# Patient Record
Sex: Female | Born: 1965
Health system: Southern US, Community
[De-identification: ages and names within clinical notes are randomized; demographics above are authoritative.]

## PROBLEM LIST (undated history)

## (undated) ENCOUNTER — Ambulatory Visit: Source: Home / Self Care

## (undated) DIAGNOSIS — E0590 Thyrotoxicosis, unspecified without thyrotoxic crisis or storm: Secondary | ICD-10-CM

## (undated) DIAGNOSIS — F329 Major depressive disorder, single episode, unspecified: Secondary | ICD-10-CM

## (undated) DIAGNOSIS — H269 Unspecified cataract: Secondary | ICD-10-CM

## (undated) DIAGNOSIS — E785 Hyperlipidemia, unspecified: Secondary | ICD-10-CM

## (undated) DIAGNOSIS — G4730 Sleep apnea, unspecified: Secondary | ICD-10-CM

## (undated) DIAGNOSIS — M2550 Pain in unspecified joint: Secondary | ICD-10-CM

## (undated) DIAGNOSIS — K219 Gastro-esophageal reflux disease without esophagitis: Secondary | ICD-10-CM

## (undated) DIAGNOSIS — I10 Essential (primary) hypertension: Secondary | ICD-10-CM

## (undated) DIAGNOSIS — D649 Anemia, unspecified: Secondary | ICD-10-CM

## (undated) DIAGNOSIS — G4700 Insomnia, unspecified: Secondary | ICD-10-CM

## (undated) DIAGNOSIS — D849 Immunodeficiency, unspecified: Secondary | ICD-10-CM

## (undated) DIAGNOSIS — Z8719 Personal history of other diseases of the digestive system: Secondary | ICD-10-CM

## (undated) DIAGNOSIS — I739 Peripheral vascular disease, unspecified: Secondary | ICD-10-CM

## (undated) DIAGNOSIS — F4310 Post-traumatic stress disorder, unspecified: Secondary | ICD-10-CM

## (undated) DIAGNOSIS — F419 Anxiety disorder, unspecified: Secondary | ICD-10-CM

## (undated) DIAGNOSIS — T4145XA Adverse effect of unspecified anesthetic, initial encounter: Secondary | ICD-10-CM

## (undated) DIAGNOSIS — H35039 Hypertensive retinopathy, unspecified eye: Secondary | ICD-10-CM

## (undated) DIAGNOSIS — E11319 Type 2 diabetes mellitus with unspecified diabetic retinopathy without macular edema: Secondary | ICD-10-CM

## (undated) DIAGNOSIS — M797 Fibromyalgia: Secondary | ICD-10-CM

## (undated) DIAGNOSIS — M2540 Effusion, unspecified joint: Secondary | ICD-10-CM

## (undated) DIAGNOSIS — T8859XA Other complications of anesthesia, initial encounter: Secondary | ICD-10-CM

## (undated) HISTORY — PX: VASCULAR SURGERY: SHX849

## (undated) HISTORY — PX: AMPUTATION: SHX166

## (undated) HISTORY — DX: Anemia, unspecified: D64.9

## (undated) HISTORY — DX: Type 2 diabetes mellitus with unspecified diabetic retinopathy without macular edema: E11.319

## (undated) HISTORY — PX: TUBAL LIGATION: SHX77

## (undated) HISTORY — DX: Anxiety disorder, unspecified: F41.9

## (undated) HISTORY — DX: Hypertensive retinopathy, unspecified eye: H35.039

## (undated) HISTORY — DX: Unspecified cataract: H26.9

## (undated) HISTORY — PX: DILATION AND CURETTAGE OF UTERUS: SHX78

## (undated) HISTORY — PX: OTHER SURGICAL HISTORY: SHX169

## (undated) HISTORY — DX: Major depressive disorder, single episode, unspecified: F32.9

## (undated) HISTORY — DX: Hyperlipidemia, unspecified: E78.5

## (undated) HISTORY — PX: COLONOSCOPY: SHX174

---
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
## 1988-03-05 HISTORY — PX: CHOLECYSTECTOMY: SHX55

## 1999-09-09 ENCOUNTER — Emergency Department (HOSPITAL_COMMUNITY): Admission: EM | Admit: 1999-09-09 | Discharge: 1999-09-10 | Payer: Self-pay | Admitting: Emergency Medicine

## 1999-09-16 ENCOUNTER — Inpatient Hospital Stay (HOSPITAL_COMMUNITY): Admission: AD | Admit: 1999-09-16 | Discharge: 1999-09-16 | Payer: Self-pay | Admitting: Obstetrics

## 1999-09-16 ENCOUNTER — Encounter: Payer: Self-pay | Admitting: Obstetrics

## 1999-09-25 ENCOUNTER — Encounter: Payer: Self-pay | Admitting: Obstetrics

## 1999-09-25 ENCOUNTER — Ambulatory Visit (HOSPITAL_COMMUNITY): Admission: RE | Admit: 1999-09-25 | Discharge: 1999-09-25 | Payer: Self-pay | Admitting: Obstetrics

## 1999-10-02 ENCOUNTER — Ambulatory Visit (HOSPITAL_COMMUNITY): Admission: RE | Admit: 1999-10-02 | Discharge: 1999-10-02 | Payer: Self-pay | Admitting: Obstetrics

## 1999-10-04 ENCOUNTER — Encounter: Payer: Self-pay | Admitting: Obstetrics

## 1999-10-04 ENCOUNTER — Ambulatory Visit (HOSPITAL_COMMUNITY): Admission: AD | Admit: 1999-10-04 | Discharge: 1999-10-04 | Payer: Self-pay | Admitting: Obstetrics

## 1999-10-04 ENCOUNTER — Encounter (INDEPENDENT_AMBULATORY_CARE_PROVIDER_SITE_OTHER): Payer: Self-pay

## 2000-09-11 ENCOUNTER — Encounter: Payer: Self-pay | Admitting: Emergency Medicine

## 2000-09-11 ENCOUNTER — Inpatient Hospital Stay (HOSPITAL_COMMUNITY): Admission: EM | Admit: 2000-09-11 | Discharge: 2000-09-13 | Payer: Self-pay | Admitting: Emergency Medicine

## 2000-09-11 ENCOUNTER — Encounter: Payer: Self-pay | Admitting: Internal Medicine

## 2004-09-18 ENCOUNTER — Other Ambulatory Visit (HOSPITAL_COMMUNITY): Admission: RE | Admit: 2004-09-18 | Discharge: 2004-10-02 | Payer: Self-pay | Admitting: Psychiatry

## 2004-09-18 ENCOUNTER — Ambulatory Visit: Payer: Self-pay | Admitting: Psychiatry

## 2005-01-02 ENCOUNTER — Emergency Department (HOSPITAL_COMMUNITY): Admission: EM | Admit: 2005-01-02 | Discharge: 2005-01-02 | Payer: Self-pay | Admitting: Emergency Medicine

## 2005-02-23 ENCOUNTER — Ambulatory Visit: Payer: Self-pay | Admitting: Family Medicine

## 2005-03-01 ENCOUNTER — Ambulatory Visit: Payer: Self-pay | Admitting: Family Medicine

## 2005-03-07 ENCOUNTER — Ambulatory Visit: Payer: Self-pay | Admitting: Family Medicine

## 2005-03-13 ENCOUNTER — Ambulatory Visit: Payer: Self-pay | Admitting: Family Medicine

## 2005-03-13 ENCOUNTER — Ambulatory Visit: Payer: Self-pay | Admitting: *Deleted

## 2005-06-28 ENCOUNTER — Emergency Department (HOSPITAL_COMMUNITY): Admission: EM | Admit: 2005-06-28 | Discharge: 2005-06-28 | Payer: Self-pay | Admitting: Emergency Medicine

## 2005-07-05 ENCOUNTER — Ambulatory Visit: Payer: Self-pay | Admitting: Internal Medicine

## 2005-07-09 ENCOUNTER — Ambulatory Visit: Payer: Self-pay | Admitting: Internal Medicine

## 2005-07-09 ENCOUNTER — Encounter (HOSPITAL_COMMUNITY): Admission: RE | Admit: 2005-07-09 | Discharge: 2005-10-07 | Payer: Self-pay | Admitting: Internal Medicine

## 2005-07-13 ENCOUNTER — Emergency Department (HOSPITAL_COMMUNITY): Admission: EM | Admit: 2005-07-13 | Discharge: 2005-07-14 | Payer: Self-pay | Admitting: Emergency Medicine

## 2005-07-13 ENCOUNTER — Ambulatory Visit: Payer: Self-pay | Admitting: Family Medicine

## 2005-07-18 ENCOUNTER — Ambulatory Visit: Payer: Self-pay | Admitting: Family Medicine

## 2005-07-24 ENCOUNTER — Ambulatory Visit (HOSPITAL_COMMUNITY): Admission: RE | Admit: 2005-07-24 | Discharge: 2005-07-24 | Payer: Self-pay | Admitting: Internal Medicine

## 2005-07-24 ENCOUNTER — Ambulatory Visit: Payer: Self-pay | Admitting: *Deleted

## 2005-07-31 ENCOUNTER — Ambulatory Visit: Payer: Self-pay | Admitting: Internal Medicine

## 2005-08-10 ENCOUNTER — Ambulatory Visit: Payer: Self-pay | Admitting: Family Medicine

## 2005-08-15 ENCOUNTER — Ambulatory Visit: Payer: Self-pay | Admitting: Endocrinology

## 2005-08-19 ENCOUNTER — Inpatient Hospital Stay (HOSPITAL_COMMUNITY): Admission: EM | Admit: 2005-08-19 | Discharge: 2005-08-20 | Payer: Self-pay | Admitting: Emergency Medicine

## 2005-08-24 ENCOUNTER — Ambulatory Visit (HOSPITAL_COMMUNITY): Admission: RE | Admit: 2005-08-24 | Discharge: 2005-08-24 | Payer: Self-pay | Admitting: Obstetrics & Gynecology

## 2005-08-31 ENCOUNTER — Encounter (INDEPENDENT_AMBULATORY_CARE_PROVIDER_SITE_OTHER): Payer: Self-pay | Admitting: Specialist

## 2005-08-31 ENCOUNTER — Ambulatory Visit (HOSPITAL_COMMUNITY): Admission: RE | Admit: 2005-08-31 | Discharge: 2005-08-31 | Payer: Self-pay | Admitting: Obstetrics & Gynecology

## 2005-10-05 ENCOUNTER — Ambulatory Visit (HOSPITAL_COMMUNITY): Admission: RE | Admit: 2005-10-05 | Discharge: 2005-10-05 | Payer: Self-pay | Admitting: Obstetrics & Gynecology

## 2006-12-19 ENCOUNTER — Encounter: Payer: Self-pay | Admitting: *Deleted

## 2006-12-19 DIAGNOSIS — R635 Abnormal weight gain: Secondary | ICD-10-CM | POA: Insufficient documentation

## 2006-12-19 DIAGNOSIS — E042 Nontoxic multinodular goiter: Secondary | ICD-10-CM | POA: Insufficient documentation

## 2006-12-19 DIAGNOSIS — F329 Major depressive disorder, single episode, unspecified: Secondary | ICD-10-CM | POA: Insufficient documentation

## 2006-12-19 DIAGNOSIS — F411 Generalized anxiety disorder: Secondary | ICD-10-CM | POA: Insufficient documentation

## 2006-12-19 DIAGNOSIS — IMO0001 Reserved for inherently not codable concepts without codable children: Secondary | ICD-10-CM | POA: Insufficient documentation

## 2006-12-19 DIAGNOSIS — R0789 Other chest pain: Secondary | ICD-10-CM | POA: Insufficient documentation

## 2006-12-19 DIAGNOSIS — K8020 Calculus of gallbladder without cholecystitis without obstruction: Secondary | ICD-10-CM | POA: Insufficient documentation

## 2006-12-19 DIAGNOSIS — F3289 Other specified depressive episodes: Secondary | ICD-10-CM | POA: Insufficient documentation

## 2006-12-19 DIAGNOSIS — I10 Essential (primary) hypertension: Secondary | ICD-10-CM | POA: Insufficient documentation

## 2006-12-19 DIAGNOSIS — E119 Type 2 diabetes mellitus without complications: Secondary | ICD-10-CM | POA: Insufficient documentation

## 2007-02-10 ENCOUNTER — Emergency Department (HOSPITAL_COMMUNITY): Admission: EM | Admit: 2007-02-10 | Discharge: 2007-02-11 | Payer: Self-pay | Admitting: Emergency Medicine

## 2007-06-26 ENCOUNTER — Emergency Department (HOSPITAL_COMMUNITY): Admission: EM | Admit: 2007-06-26 | Discharge: 2007-06-27 | Payer: Self-pay | Admitting: Emergency Medicine

## 2007-06-28 ENCOUNTER — Emergency Department (HOSPITAL_COMMUNITY): Admission: EM | Admit: 2007-06-28 | Discharge: 2007-06-28 | Payer: Self-pay | Admitting: Emergency Medicine

## 2007-06-30 ENCOUNTER — Emergency Department (HOSPITAL_COMMUNITY): Admission: EM | Admit: 2007-06-30 | Discharge: 2007-06-30 | Payer: Self-pay | Admitting: Family Medicine

## 2007-09-04 ENCOUNTER — Emergency Department (HOSPITAL_COMMUNITY): Admission: EM | Admit: 2007-09-04 | Discharge: 2007-09-04 | Payer: Self-pay | Admitting: Emergency Medicine

## 2007-12-20 ENCOUNTER — Emergency Department (HOSPITAL_COMMUNITY): Admission: EM | Admit: 2007-12-20 | Discharge: 2007-12-20 | Payer: Self-pay | Admitting: Emergency Medicine

## 2007-12-28 ENCOUNTER — Emergency Department (HOSPITAL_COMMUNITY): Admission: EM | Admit: 2007-12-28 | Discharge: 2007-12-28 | Payer: Self-pay | Admitting: Emergency Medicine

## 2008-01-30 IMAGING — US US SOFT TISSUE HEAD/NECK
1 series · 14 of 25 positions shown · non-contrast
Comparison: [REDACTED] chest CT report of 09/11/00 (describing left greater than right lobe enlargement and tracheal displacement).

CLINICAL DATA: Chest pain. Evaluate thyroid as cause of chest pain. 
 THYROID ULTRASOUND ? 07/24/05:
TECHNIQUE: Ultrasound examination of the thyroid gland and adjacent soft tissue structures was performed.

[Series 1: unknown · 0.12mm/px · 14 of 40 slices shown]
[im 1/40]
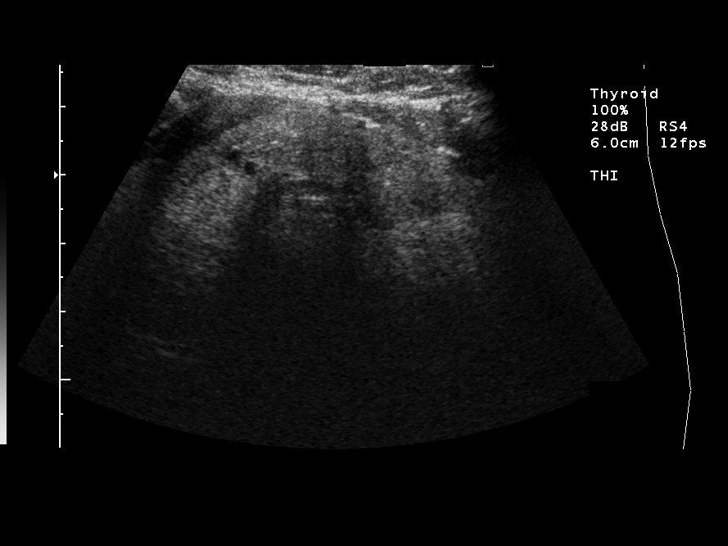
[im 4/40]
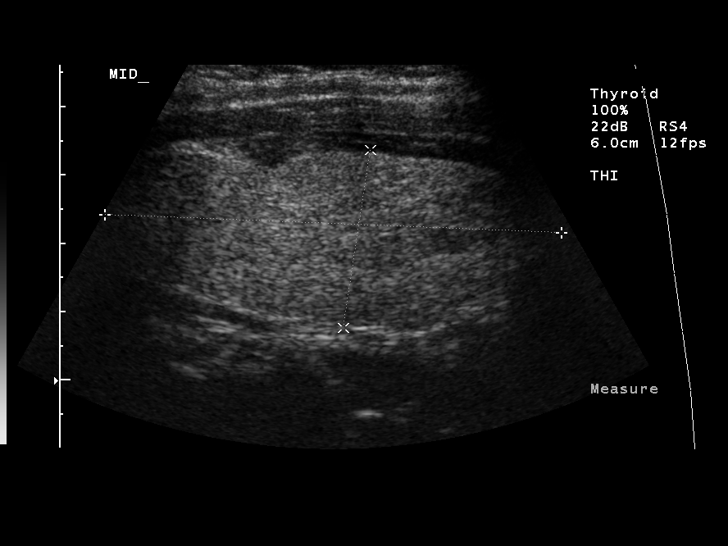
[im 7/40]
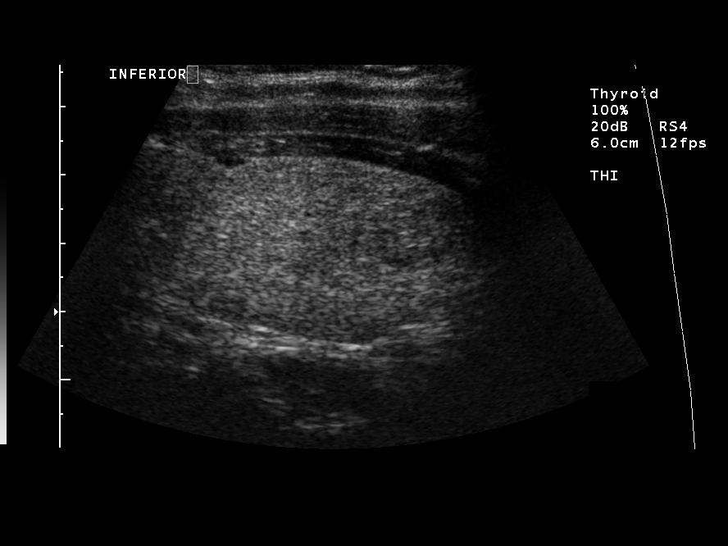
[im 10/40]
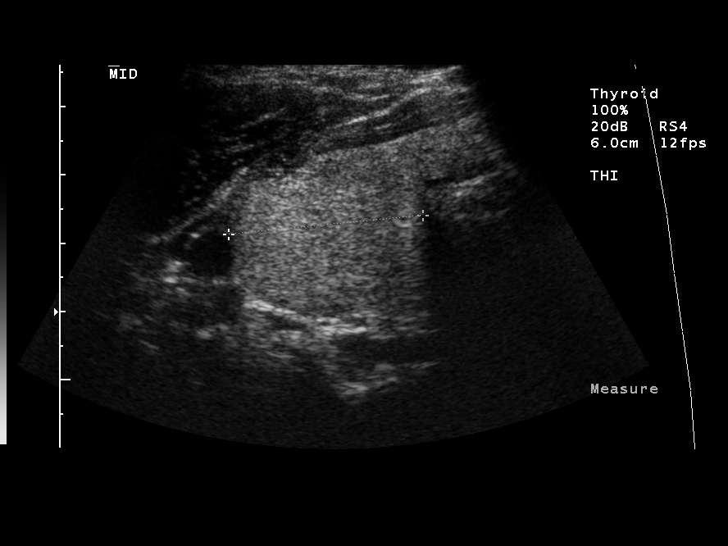
[im 14/40]
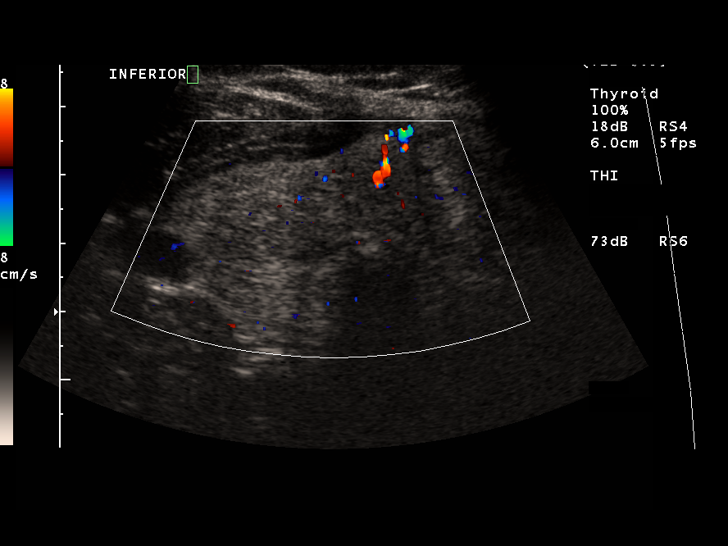
[im 15/40]
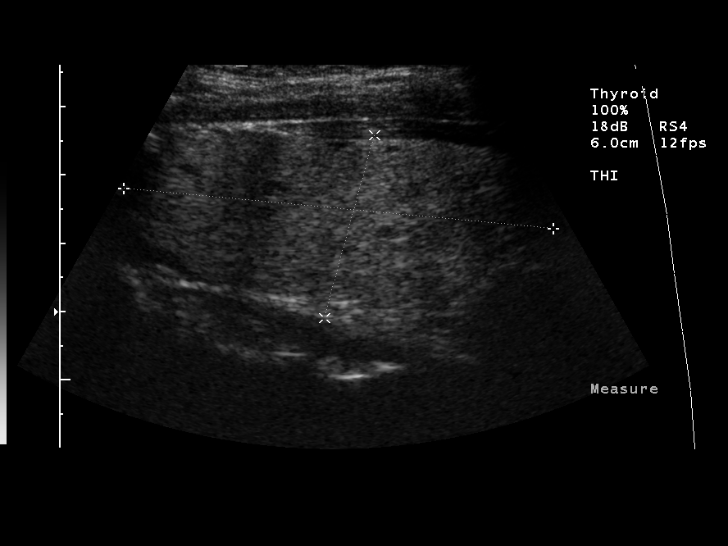
[im 18/40]
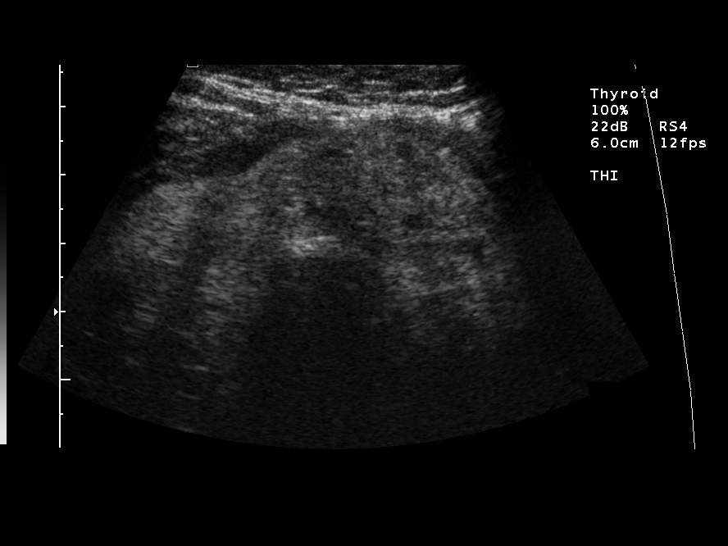
[im 22/40]
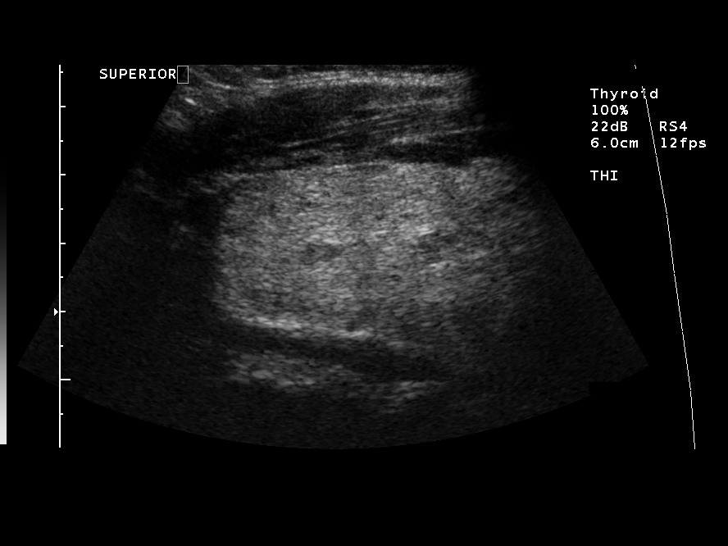
[im 25/40]
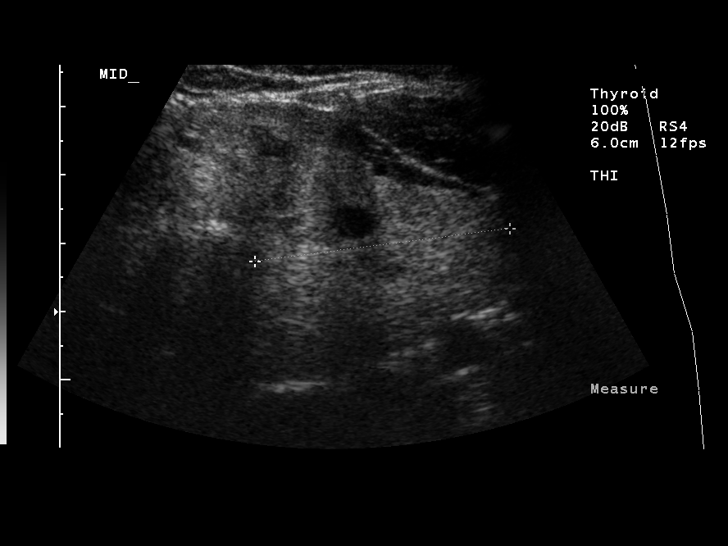
[im 27/40]
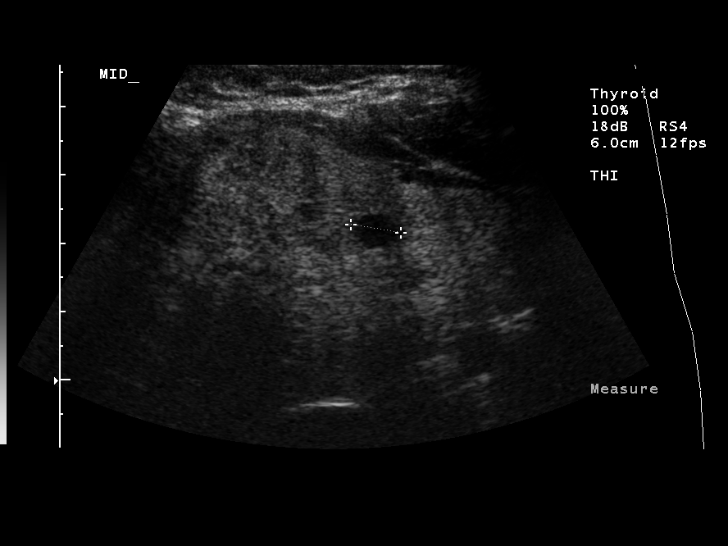
[im 30/40]
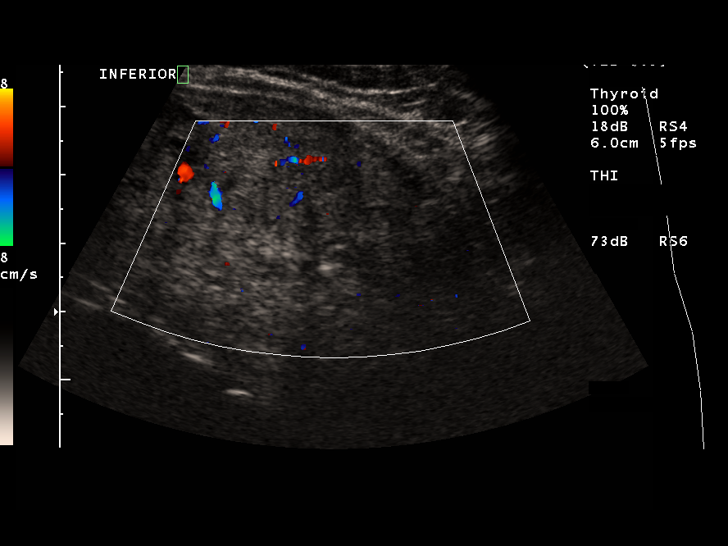
[im 33/40]
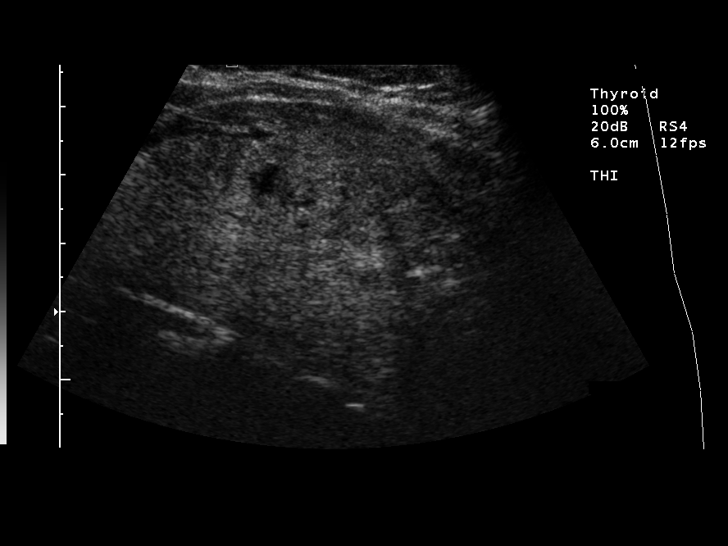
[im 36/40]
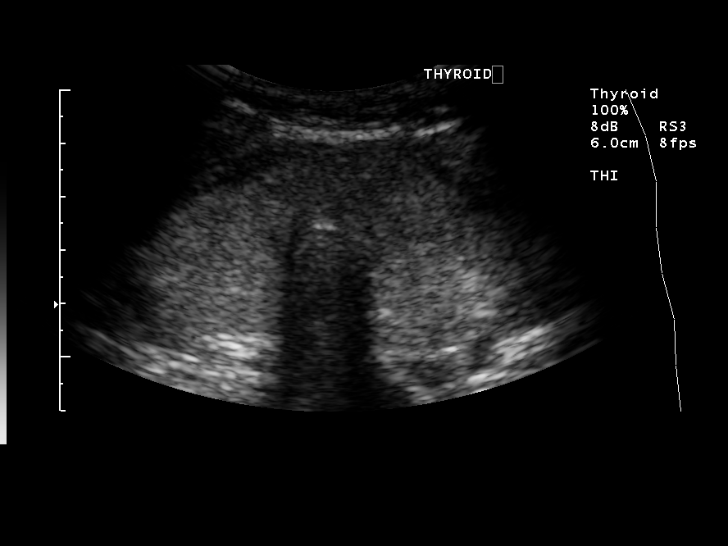
[im 40/40]
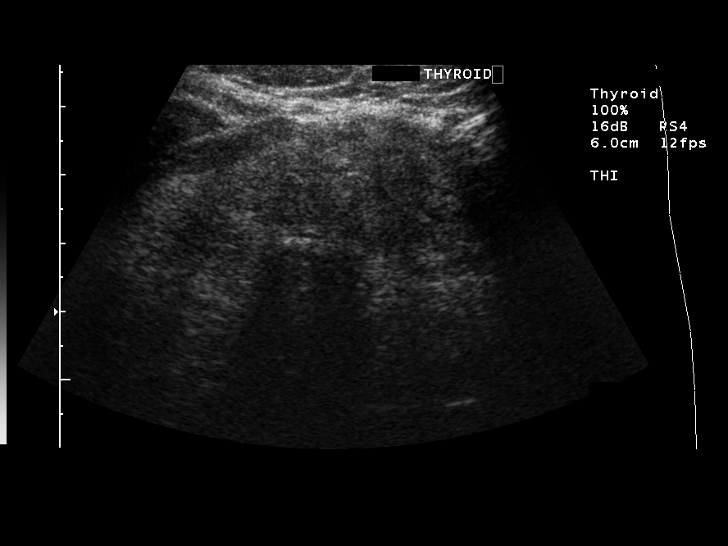

[14 of 25 positions shown; findings below may reference images not displayed]

The thyroid gland is diffusely enlarged with the right lobe measuring 6.7 cm long x 2.6 cm AP x 2.9 cm wide and left lobe 6.8 cm long x 2.8 cm AP x 3.8 cm wide. The isthmus measures 1.4 cm.  Thyroid echotexture is diffusely inhomogeneous.  8 mm benign appearing cystic focus is seen at the mid left lobe with no other discrete nodule seen.
IMPRESSION: Slight multinodular goiter with 8 mm degenerated cystic adenoma mid left lobe.

## 2008-02-23 ENCOUNTER — Ambulatory Visit: Payer: Self-pay | Admitting: Family Medicine

## 2008-02-23 DIAGNOSIS — K219 Gastro-esophageal reflux disease without esophagitis: Secondary | ICD-10-CM | POA: Insufficient documentation

## 2008-02-23 LAB — CONVERTED CEMR LAB
ALT: 14 units/L (ref 0–35)
AST: 10 units/L (ref 0–37)
Albumin: 4 g/dL (ref 3.5–5.2)
Alkaline Phosphatase: 86 units/L (ref 39–117)
BUN: 15 mg/dL (ref 6–23)
Basophils Absolute: 0.1 10*3/uL (ref 0.0–0.1)
Basophils Relative: 1 % (ref 0–1)
Blood Glucose, Fingerstick: 298
CO2: 20 meq/L (ref 19–32)
Calcium: 9 mg/dL (ref 8.4–10.5)
Chloride: 99 meq/L (ref 96–112)
Cholesterol: 201 mg/dL — ABNORMAL HIGH (ref 0–200)
Creatinine, Ser: 0.59 mg/dL (ref 0.40–1.20)
Eosinophils Absolute: 0.3 10*3/uL (ref 0.0–0.7)
Eosinophils Relative: 3 % (ref 0–5)
Glucose, Bld: 266 mg/dL — ABNORMAL HIGH (ref 70–99)
HCT: 45.1 % (ref 36.0–46.0)
HDL: 54 mg/dL (ref >39)
Hemoglobin: 14.7 g/dL (ref 12.0–15.0)
Hgb A1c MFr Bld: 12.5 %
LDL Cholesterol: 120 mg/dL — ABNORMAL HIGH (ref 0–99)
Lymphocytes Relative: 46 % (ref 12–46)
Lymphs Abs: 5.4 10*3/uL — ABNORMAL HIGH (ref 0.7–4.0)
MCHC: 32.6 g/dL (ref 30.0–36.0)
MCV: 95.1 fL (ref 78.0–100.0)
Monocytes Absolute: 0.8 10*3/uL (ref 0.1–1.0)
Monocytes Relative: 6 % (ref 3–12)
Neutro Abs: 5.3 10*3/uL (ref 1.7–7.7)
Neutrophils Relative %: 45 % (ref 43–77)
Platelets: 371 10*3/uL (ref 150–400)
Potassium: 3.9 meq/L (ref 3.5–5.3)
RBC: 4.74 M/uL (ref 3.87–5.11)
RDW: 13.4 % (ref 11.5–15.5)
Sodium: 136 meq/L (ref 135–145)
TSH: 0.605 microintl units/mL (ref 0.350–4.50)
Total Bilirubin: 0.5 mg/dL (ref 0.3–1.2)
Total CHOL/HDL Ratio: 3.7
Total Protein: 7.5 g/dL (ref 6.0–8.3)
Triglycerides: 137 mg/dL (ref <150)
VLDL: 27 mg/dL (ref 0–40)
WBC: 11.8 10*3/uL — ABNORMAL HIGH (ref 4.0–10.5)

## 2008-03-04 ENCOUNTER — Ambulatory Visit (HOSPITAL_COMMUNITY): Admission: RE | Admit: 2008-03-04 | Discharge: 2008-03-04 | Payer: Self-pay | Admitting: Family Medicine

## 2008-03-12 ENCOUNTER — Ambulatory Visit: Payer: Self-pay | Admitting: Family Medicine

## 2008-03-15 ENCOUNTER — Encounter (INDEPENDENT_AMBULATORY_CARE_PROVIDER_SITE_OTHER): Payer: Self-pay | Admitting: Family Medicine

## 2008-03-29 ENCOUNTER — Encounter (INDEPENDENT_AMBULATORY_CARE_PROVIDER_SITE_OTHER): Payer: Self-pay | Admitting: Family Medicine

## 2008-04-12 ENCOUNTER — Emergency Department (HOSPITAL_COMMUNITY): Admission: EM | Admit: 2008-04-12 | Discharge: 2008-04-12 | Payer: Self-pay | Admitting: Emergency Medicine

## 2008-04-13 ENCOUNTER — Encounter (INDEPENDENT_AMBULATORY_CARE_PROVIDER_SITE_OTHER): Payer: Self-pay | Admitting: Family Medicine

## 2008-04-16 ENCOUNTER — Encounter (INDEPENDENT_AMBULATORY_CARE_PROVIDER_SITE_OTHER): Payer: Self-pay | Admitting: Family Medicine

## 2008-04-21 ENCOUNTER — Ambulatory Visit: Payer: Self-pay | Admitting: Family Medicine

## 2008-04-21 DIAGNOSIS — D692 Other nonthrombocytopenic purpura: Secondary | ICD-10-CM | POA: Insufficient documentation

## 2008-04-22 ENCOUNTER — Emergency Department (HOSPITAL_COMMUNITY): Admission: EM | Admit: 2008-04-22 | Discharge: 2008-04-23 | Payer: Self-pay | Admitting: Emergency Medicine

## 2008-04-23 ENCOUNTER — Emergency Department (HOSPITAL_COMMUNITY): Admission: EM | Admit: 2008-04-23 | Discharge: 2008-04-24 | Payer: Self-pay | Admitting: Emergency Medicine

## 2008-04-26 ENCOUNTER — Encounter (INDEPENDENT_AMBULATORY_CARE_PROVIDER_SITE_OTHER): Payer: Self-pay | Admitting: Family Medicine

## 2008-04-27 ENCOUNTER — Encounter (INDEPENDENT_AMBULATORY_CARE_PROVIDER_SITE_OTHER): Payer: Self-pay | Admitting: Family Medicine

## 2008-04-28 ENCOUNTER — Ambulatory Visit: Payer: Self-pay | Admitting: Family Medicine

## 2008-04-28 DIAGNOSIS — M310 Hypersensitivity angiitis: Secondary | ICD-10-CM | POA: Insufficient documentation

## 2008-04-28 LAB — CONVERTED CEMR LAB
Blood Glucose, AC Bkfst: 294 mg/dL
HCV Ab: NEGATIVE
Hep A Total Ab: NEGATIVE
Hep B Core Total Ab: NEGATIVE
Hep B E Ab: NEGATIVE
Hepatitis B Surface Ag: NEGATIVE
Hgb A1c MFr Bld: 11.6 %
Rhuematoid fact SerPl-aCnc: <20 intl units/mL (ref 0–20)

## 2008-04-29 ENCOUNTER — Ambulatory Visit: Payer: Self-pay | Admitting: Vascular Surgery

## 2008-04-29 ENCOUNTER — Ambulatory Visit: Admission: RE | Admit: 2008-04-29 | Discharge: 2008-04-29 | Payer: Self-pay | Admitting: Family Medicine

## 2008-04-29 ENCOUNTER — Inpatient Hospital Stay (HOSPITAL_COMMUNITY): Admission: EM | Admit: 2008-04-29 | Discharge: 2008-05-05 | Payer: Self-pay | Admitting: Emergency Medicine

## 2008-04-29 ENCOUNTER — Encounter: Payer: Self-pay | Admitting: Vascular Surgery

## 2008-04-29 ENCOUNTER — Telehealth (INDEPENDENT_AMBULATORY_CARE_PROVIDER_SITE_OTHER): Payer: Self-pay | Admitting: Internal Medicine

## 2008-04-29 DIAGNOSIS — I739 Peripheral vascular disease, unspecified: Secondary | ICD-10-CM | POA: Insufficient documentation

## 2008-04-29 HISTORY — PX: FEMORAL BYPASS: SHX50

## 2008-05-06 ENCOUNTER — Emergency Department (HOSPITAL_COMMUNITY): Admission: EM | Admit: 2008-05-06 | Discharge: 2008-05-07 | Payer: Self-pay | Admitting: Emergency Medicine

## 2008-05-11 ENCOUNTER — Emergency Department (HOSPITAL_COMMUNITY): Admission: EM | Admit: 2008-05-11 | Discharge: 2008-05-11 | Payer: Self-pay | Admitting: *Deleted

## 2008-05-12 ENCOUNTER — Ambulatory Visit: Payer: Self-pay | Admitting: Vascular Surgery

## 2008-05-13 ENCOUNTER — Encounter (INDEPENDENT_AMBULATORY_CARE_PROVIDER_SITE_OTHER): Payer: Self-pay | Admitting: Family Medicine

## 2008-05-13 ENCOUNTER — Inpatient Hospital Stay (HOSPITAL_COMMUNITY): Admission: EM | Admit: 2008-05-13 | Discharge: 2008-05-26 | Payer: Self-pay | Admitting: Emergency Medicine

## 2008-05-17 ENCOUNTER — Encounter (INDEPENDENT_AMBULATORY_CARE_PROVIDER_SITE_OTHER): Payer: Self-pay | Admitting: Family Medicine

## 2008-05-19 ENCOUNTER — Ambulatory Visit: Payer: Self-pay | Admitting: Internal Medicine

## 2008-06-02 ENCOUNTER — Encounter (INDEPENDENT_AMBULATORY_CARE_PROVIDER_SITE_OTHER): Payer: Self-pay | Admitting: Family Medicine

## 2008-06-02 ENCOUNTER — Ambulatory Visit: Payer: Self-pay | Admitting: Vascular Surgery

## 2008-06-23 ENCOUNTER — Ambulatory Visit: Payer: Self-pay | Admitting: Vascular Surgery

## 2008-06-28 ENCOUNTER — Telehealth (INDEPENDENT_AMBULATORY_CARE_PROVIDER_SITE_OTHER): Payer: Self-pay | Admitting: Family Medicine

## 2008-06-28 ENCOUNTER — Ambulatory Visit: Payer: Self-pay | Admitting: Family Medicine

## 2008-06-28 LAB — CONVERTED CEMR LAB: Blood Glucose, Fingerstick: 203

## 2008-07-02 ENCOUNTER — Telehealth (INDEPENDENT_AMBULATORY_CARE_PROVIDER_SITE_OTHER): Payer: Self-pay | Admitting: Family Medicine

## 2008-07-07 ENCOUNTER — Encounter (INDEPENDENT_AMBULATORY_CARE_PROVIDER_SITE_OTHER): Payer: Self-pay | Admitting: Family Medicine

## 2008-07-07 ENCOUNTER — Ambulatory Visit: Payer: Self-pay | Admitting: Vascular Surgery

## 2008-07-07 ENCOUNTER — Telehealth (INDEPENDENT_AMBULATORY_CARE_PROVIDER_SITE_OTHER): Payer: Self-pay | Admitting: Family Medicine

## 2008-07-12 ENCOUNTER — Inpatient Hospital Stay (HOSPITAL_COMMUNITY): Admission: RE | Admit: 2008-07-12 | Discharge: 2008-07-14 | Payer: Self-pay | Admitting: Vascular Surgery

## 2008-07-12 ENCOUNTER — Encounter: Payer: Self-pay | Admitting: Vascular Surgery

## 2008-07-12 ENCOUNTER — Ambulatory Visit: Payer: Self-pay | Admitting: Vascular Surgery

## 2008-07-12 HISTORY — PX: TOE AMPUTATION: SHX809

## 2008-07-14 ENCOUNTER — Encounter (INDEPENDENT_AMBULATORY_CARE_PROVIDER_SITE_OTHER): Payer: Self-pay | Admitting: Family Medicine

## 2008-07-21 ENCOUNTER — Ambulatory Visit: Payer: Self-pay | Admitting: Family Medicine

## 2008-07-21 LAB — CONVERTED CEMR LAB: Blood Glucose, Fingerstick: 231

## 2008-08-01 ENCOUNTER — Telehealth (INDEPENDENT_AMBULATORY_CARE_PROVIDER_SITE_OTHER): Payer: Self-pay | Admitting: Family Medicine

## 2008-08-03 DIAGNOSIS — S98139A Complete traumatic amputation of one unspecified lesser toe, initial encounter: Secondary | ICD-10-CM | POA: Insufficient documentation

## 2008-08-04 ENCOUNTER — Encounter (INDEPENDENT_AMBULATORY_CARE_PROVIDER_SITE_OTHER): Payer: Self-pay | Admitting: Family Medicine

## 2008-08-04 ENCOUNTER — Ambulatory Visit: Payer: Self-pay | Admitting: Vascular Surgery

## 2008-08-09 ENCOUNTER — Ambulatory Visit: Payer: Self-pay | Admitting: Family Medicine

## 2008-08-09 DIAGNOSIS — L03319 Cellulitis of trunk, unspecified: Secondary | ICD-10-CM | POA: Insufficient documentation

## 2008-08-09 DIAGNOSIS — L02219 Cutaneous abscess of trunk, unspecified: Secondary | ICD-10-CM | POA: Insufficient documentation

## 2008-08-09 LAB — CONVERTED CEMR LAB
Blood Glucose, Fingerstick: 205
Hgb A1c MFr Bld: 10.1 %

## 2008-08-10 ENCOUNTER — Encounter (INDEPENDENT_AMBULATORY_CARE_PROVIDER_SITE_OTHER): Payer: Self-pay | Admitting: Nurse Practitioner

## 2008-08-10 ENCOUNTER — Ambulatory Visit (HOSPITAL_COMMUNITY): Admission: RE | Admit: 2008-08-10 | Discharge: 2008-08-11 | Payer: Self-pay | Admitting: General Surgery

## 2008-08-25 ENCOUNTER — Ambulatory Visit: Payer: Self-pay | Admitting: Vascular Surgery

## 2008-08-26 ENCOUNTER — Encounter (INDEPENDENT_AMBULATORY_CARE_PROVIDER_SITE_OTHER): Payer: Self-pay | Admitting: Nurse Practitioner

## 2008-09-08 ENCOUNTER — Ambulatory Visit: Payer: Self-pay | Admitting: Vascular Surgery

## 2008-09-23 ENCOUNTER — Encounter (INDEPENDENT_AMBULATORY_CARE_PROVIDER_SITE_OTHER): Payer: Self-pay | Admitting: Internal Medicine

## 2008-10-20 ENCOUNTER — Ambulatory Visit: Payer: Self-pay | Admitting: Vascular Surgery

## 2008-12-09 ENCOUNTER — Encounter (INDEPENDENT_AMBULATORY_CARE_PROVIDER_SITE_OTHER): Payer: Self-pay | Admitting: Nurse Practitioner

## 2010-03-26 ENCOUNTER — Encounter: Payer: Self-pay | Admitting: Family Medicine

## 2010-04-02 LAB — CONVERTED CEMR LAB
ALT: 22 units/L (ref 0–35)
AST: 14 units/L (ref 0–37)
Albumin: 4.2 g/dL (ref 3.5–5.2)
Alkaline Phosphatase: 72 units/L (ref 39–117)
Anti Nuclear Antibody(ANA): NEGATIVE
BUN: 11 mg/dL (ref 6–23)
Basophils Absolute: 0 10*3/uL (ref 0.0–0.1)
Basophils Relative: 0 % (ref 0–1)
Blood Glucose, Fingerstick: 186
CO2: 22 meq/L (ref 19–32)
Calcium: 9.5 mg/dL (ref 8.4–10.5)
Chloride: 101 meq/L (ref 96–112)
Creatinine, Ser: 0.65 mg/dL (ref 0.40–1.20)
Eosinophils Absolute: 0.2 10*3/uL (ref 0.0–0.7)
Eosinophils Relative: 2 % (ref 0–5)
Glucose, Bld: 162 mg/dL — ABNORMAL HIGH (ref 70–99)
HCT: 44.9 % (ref 36.0–46.0)
Hemoglobin: 14.8 g/dL (ref 12.0–15.0)
Lymphocytes Relative: 47 % — ABNORMAL HIGH (ref 12–46)
Lymphs Abs: 4.4 10*3/uL — ABNORMAL HIGH (ref 0.7–4.0)
MCHC: 33 g/dL (ref 30.0–36.0)
MCV: 96.4 fL (ref 78.0–100.0)
Monocytes Absolute: 0.8 10*3/uL (ref 0.1–1.0)
Monocytes Relative: 9 % (ref 3–12)
Neutro Abs: 3.9 10*3/uL (ref 1.7–7.7)
Neutrophils Relative %: 42 % — ABNORMAL LOW (ref 43–77)
Platelets: 332 10*3/uL (ref 150–400)
Potassium: 4.6 meq/L (ref 3.5–5.3)
RBC: 4.66 M/uL (ref 3.87–5.11)
RDW: 13.5 % (ref 11.5–15.5)
Sed Rate: 12 mm/hr (ref 0–22)
Sodium: 138 meq/L (ref 135–145)
Total Bilirubin: 1.1 mg/dL (ref 0.3–1.2)
Total Protein: 7.7 g/dL (ref 6.0–8.3)
WBC: 9.4 10*3/uL (ref 4.0–10.5)

## 2010-04-04 NOTE — Miscellaneous (Signed)
Summary: Dx added  Clinical Lists Changes  Problems: Added new problem of LOWER LIMB AMPUTATION, OTHER TOE (ICD-V49.72) - amputation of left 1,2,3 toes F/u by [facility]

## 2010-04-04 NOTE — Progress Notes (Signed)
Summary: Missed 5/28 appt.Marland KitchenMarland KitchenCalled to check on DM  Phone Note Outgoing Call   Call placed by: Beverley Fiedler MD,  Aug 01, 2008 3:19 PM Summary of Call: Pt was scheduled for DM f/u on 07/30/2008 but cancelled appt.  She is r/s to see Ms.Martin on 08/12/2008. MD called Pt to see how DM doing as CBGs need to be controlled with post-op wound healing. Pt says AM CBGs running 90's and most of other CBGs running 120-130's Highest was 260 when she forgot to do her Novolg before a meal. She has been doing the Levemir 35 units two times a day and Novolog SSI .qac. She had been having more foot pain but today is better for her and she did her own wound dressing.Pain better when she went back to using a walker instead of her cane. She sees Careers adviser, Dr.Fields, next on 08/04/2008. She has to R/S her f/u appt with Dr.Anderson and plans to do so. Initial call taken by: Beverley Fiedler MD,  Aug 01, 2008 3:24 PM

## 2010-04-04 NOTE — Letter (Signed)
Summary: FOOT PAIN/RHEUM  CONSULT  FOOT PAIN/RHEUM  CONSULT   Imported By: Arta Bruce 07/05/2008 12:04:15  _____________________________________________________________________  External Attachment:    Type:   Image     Comment:   External Document

## 2010-04-04 NOTE — Progress Notes (Signed)
Summary: Increase celexa/see counselor  Phone Note Outgoing Call   Summary of Call: At her recent office visit, We discussed patient's preference for the cymbalta which she was apparently started on in hospital. However, her dose was not known...she is currently on celexa 20mg  once daily.  I reviewed both Hospital discharge summaries from Feb and from March 2010...Marland Kitchenneither show her being discharged on cymbalta. She was seen by the in-patient psychiatrist, Dr.Williford, during her 04/29/08-05/05/2008 hospitalization for Major Depressive Disorder... Dr.Koua Deeg recommends that she contact either [facility] or [facility](CMA to give her the contact #'s) for counseling. Dr.Tresa Jolley would prefer for her to increase the celexa to 40mg  once daily and continue her wellbutrin. Cymbalta is not as effective for major depression... CMA to call celexa 40 mg script to pharmacy of patient's choice...if she hasn't gotten her Medicaid card then send to [facility]... Initial call taken by: Beverley Fiedler MD,  July 02, 2008 1:35 PM  Follow-up for Phone Call        Left message on answering machine for pt to call back....Marland KitchenMarland KitchenArmenia Barber  Jul 05, 2008 5:17 PM   Additional Follow-up for Phone Call Additional follow up Details #1::        spoke with pt and informed her about her med...Marland KitchenMarland KitchenMarland Kitchen rx is called in to [facility] Additional Follow-up by: Kelly Barber,  Jul 06, 2008 11:02 AM    New/Updated Medications: CELEXA 40 MG TABS (CITALOPRAM HYDROBROMIDE) Take 1 tablet by mouth once a day   Prescriptions: CELEXA 40 MG TABS (CITALOPRAM HYDROBROMIDE) Take 1 tablet by mouth once a day  #30 x 2   Entered and Authorized by:   Beverley Fiedler MD   Signed by:   Beverley Fiedler MD on 07/02/2008   Method used:   Telephoned to ...         RxID:   [id number]

## 2010-04-04 NOTE — Miscellaneous (Signed)
  Clinical Lists Changes  Observations: Added new observation of DIAB EYE EX: No retinopathy but glaucoma suspect (04/13/2008 12:39)

## 2010-04-04 NOTE — Assessment & Plan Note (Signed)
Summary: ABDOMINAL ABCESS / NS   Vital Signs:  Patient profile:   45 year old female Height:      67 inches Weight:      233 pounds Temp:     99.0 degrees F oral Pulse rate:   88 / minute Pulse rhythm:   regular Resp:     18 per minute BP sitting:   120 / 80  (left arm) Cuff size:   large  Vitals Entered By: Germain Osgood (August 09, 2008 2:16 PM) CC: pt has abcess on her stomach it has ordor and it is draining Is Patient Diabetic? Yes  Pain Assessment Patient in pain? yes     Location: stomach Intensity: 7 Type: aching CBG Result 205 CBG Device ID [id number]  Does patient need assistance? Ambulation Normal   CC:  pt has abcess on her stomach it has ordor and it is draining.  History of Present Illness: Patient sent here by Dr Darrick Penna (Vascular surgeon) s/p Lfem-pop bypass Feb 25th 2010.  Developed absess last week at left side of abdomen and went into see surgeon.  He put pt on twice a day on June4th.  Home health nurse came out today and told her it was smelling and she needed a cultrure of this area.  Patient has had reddish-brown discharge with smell to site.  No fever or streaking around site. Home health nurse tried to obtain a culture order from Dr. Darrick Penna, but since it was from abdomen, he wanted her to have pcp evaluation and referral to general surgeon.    Habits & Providers  Alcohol-Tobacco-Diet     Tobacco Status: quit  Current Medications (verified): 1)  Metformin Hcl 1000 Mg  Tabs (Metformin Hcl) .... Take 1 Tablet By Mouth Every 12 Hours 2)  Potassium Chloride Cr 10 Meq Cr-Tabs (Potassium Chloride) .... Take 2 Tablets By Mouth Once Daily 3)  Lisinopril-Hydrochlorothiazide 20-12.5 Mg Tabs (Lisinopril-Hydrochlorothiazide) .... Take 2 Tablets  By Mouth Every Morning 4)  Omeprazole 20 Mg Cpdr (Omeprazole) .... Take 1 Capsule By Mouth At Bedtime 5)  Glucometer and Supplies .... Iddm Check Cbg Two Times A Day and Record 6)  Glucomer Strips .... Iddm Check Cbgs Two  Times A Day and Record 7)  Insulin Syringes 8)  Wellbutrin Sr 150 Mg Xr12h-Tab (Bupropion Hcl) .... Take 1 Tablet By Mouth Once A Day 9)  Prednisone 20 Mg Tabs (Prednisone) .... Take 1 Tablet. Every Day 10)  Novolog Flexpen 100 Unit/ml Soln (Insulin Aspart) .... Per Sliding Scale Prior To Each Meal. 11)  Santyl 250 Unit/gm Oint (Collagenase) .... Apply Once Daily 12)  Restore Hydrogel Dressing  Gel (Wound Dressings) .... As Needed To Inscision 13)  Percocet 5-325 Mg Tabs (Oxycodone-Acetaminophen) .... Take 1 Tablets By Mouth Every 4-6 Hours As Needed Pain Pain(Dr.fields) 14)  Levemir Flexpen 100 Unit/ml Soln (Insulin Detemir) .... Take 35 Units Subcutaneously Bid 15)  Novolog 100 Unit/ml Soln (Insulin Aspart) .... Check Blood Sugar Prior To Meal: If 80-100 Do 10 Units,101-150 Do 12 Units,151-200 15 Units,201-250 20 Units,over 250 Do 25 Units 16)  Celexa 40 Mg Tabs (Citalopram Hydrobromide) .... Take 1 Tablet By Mouth Once A Day  Allergies (verified): 1)  ! Codeine 2)  ! Augmentin  Social History: unemp;oyedSmoking Status:  quit  Review of Systems  The patient denies fever, peripheral edema, muscle weakness, abdominal pain, and difficulty walking.    Physical Exam  General:  alert, well-developed, well-nourished, and well-hydrated.   Additional Exam:  2cm  brown draining absess from left lower abdomen lateral to unbilicus.  No streaking but moderated tenderness at site.  Foul smelling drainiage from lower left side of circular absess at 5 o"clock.  Abdomen soft with no tenderness except at absess site.   Impression & Recommendations:  Problem # 1:  Abdominal Absess Stop Cipro and start on Bactrim DS one pill two times a day.  Continue home health care to change dressing daily.  Referral to general surgery and will call patient with report of would culture.  Complete Medication List: 1)  Metformin Hcl 1000 Mg Tabs (Metformin hcl) .... Take 1 tablet by mouth every 12 hours 2)   Potassium Chloride Cr 10 Meq Cr-tabs (Potassium chloride) .... Take 2 tablets by mouth once daily 3)  Lisinopril-hydrochlorothiazide 20-12.5 Mg Tabs (Lisinopril-hydrochlorothiazide) .... Take 2 tablets  by mouth every morning 4)  Omeprazole 20 Mg Cpdr (Omeprazole) .... Take 1 capsule by mouth at bedtime 5)  Glucometer and Supplies  .... Iddm check cbg two times a day and record 6)  Glucomer Strips  .... Iddm check cbgs two times a day and record 7)  Insulin Syringes  8)  Wellbutrin Sr 150 Mg Xr12h-tab (Bupropion hcl) .... Take 1 tablet by mouth once a day 9)  Prednisone 20 Mg Tabs (Prednisone) .... Take 1 tablet. every day 10)  Novolog Flexpen 100 Unit/ml Soln (Insulin aspart) .... Per sliding scale prior to each meal. 11)  Santyl 250 Unit/gm Oint (Collagenase) .... Apply once daily 12)  Restore Hydrogel Dressing Gel (Wound dressings) .... As needed to inscision 13)  Percocet 5-325 Mg Tabs (Oxycodone-acetaminophen) .... Take 1 tablets by mouth every 4-6 hours as needed pain pain(dr.fields) 14)  Levemir Flexpen 100 Unit/ml Soln (Insulin detemir) .... Take 35 units subcutaneously bid 15)  Novolog 100 Unit/ml Soln (Insulin aspart) .... Check blood sugar prior to meal: if 80-100 do 10 units,101-150 do 12 units,151-200 15 units,201-250 20 units,over 250 do 25 units 16)  Celexa 40 Mg Tabs (Citalopram hydrobromide) .... Take 1 tablet by mouth once a day 17)  Cipro 500 Mg Tabs (Ciprofloxacin hcl) .... Take 1 tab by mouth once daily for 10 days 18)  Bactrim Ds 800-160 Mg Tabs (Sulfamethoxazole-trimethoprim) .... One pill twice a day until gone  Other Orders: Fingerstick ([address]) T-Culture, Genital ([id number]) Surgical Referral (Surgery) Home Health Referral (Home Health)  Patient Instructions: 1)  Stop Cipro,  Start Bactrim DS one pill two times a day until completed.  Will make patient a general surgical referral and call her with wound culture results.  WIll have home health to change dressing  daily until reevaluated by surgery. Patient is to call for increased drainage or fever >100.5 Prescriptions: BACTRIM DS 800-160 MG TABS (SULFAMETHOXAZOLE-TRIMETHOPRIM) one pill twice a day until gone  #20 x 0   Entered and Authorized by:   Fannie Knee Abhay Godbolt   Signed by:   Fannie Knee Maira Christon on 08/09/2008   Method used:   Print then Give to Patient   RxID:   [id number]   Laboratory Results   Blood Tests     HGBA1C: 10.1%   (Normal Range: Non-Diabetic - 3-6%   Control Diabetic - 6-8%) CBG Random:: 205mg /dL

## 2010-04-04 NOTE — Letter (Signed)
Summary: REQUESTED RECORDS FRO SELF/TO PICK UP 12/13/08  REQUESTED RECORDS FRO SELF/TO PICK UP 12/13/08   Imported By: Silvio Pate Stanislawscyk 12/09/2008 11:29:59  _____________________________________________________________________  External Attachment:    Type:   Image     Comment:   External Document

## 2010-04-04 NOTE — Letter (Signed)
Summary: [facility]  [facility]   Imported By: Arta Bruce 04/27/2008 17:14:06  _____________________________________________________________________  External Attachment:    Type:   Image     Comment:   External Document

## 2010-04-04 NOTE — Letter (Signed)
Summary: *HSN Results Follow up  [facility]  [address] [place], [place] [address]   Phone: [phone]  Fax: [phone]      04/27/2008   Kelly Barber [id number] [place], [place]  [address]   Dear  Ms. [name],                            ____S.Drinkard,FNP   ____D. Gore,FNP       ____B. McPherson,MD   ___X_V. Yariela Tison,MD    ____E. Mulberry,MD    ____N. Daphine Deutscher, FNP  ____D. Reche Dixon, MD    ____K. Philipp Deputy, MD    ____Other     This letter is to inform you that your recent test(s):  _______Pap Smear    ___X____Lab Test     _______X-ray    ___X____ is within acceptable limits  _______ requires a medication change  _______ requires a follow-up lab visit  _______ requires a follow-up visit with your provider   Comments: your glucose(blood sugar) was a bit high but otherwise your labs were fine.       _________________________________________________________ If you have any questions, please contact our office                     Sincerely,  Beverley Fiedler MD [facility]

## 2010-04-04 NOTE — Letter (Signed)
Summary: [facility]/MAILED  [facility]/MAILED   Imported By: Arta Bruce 09/23/2008 09:07:31  _____________________________________________________________________  External Attachment:    Type:   Image     Comment:   External Document

## 2010-04-04 NOTE — Assessment & Plan Note (Signed)
Summary: 1 WEEK FU/PER Leonce Bale///KT   Vital Signs:  Patient Profile:   45 Years Old Female Height:     67 inches Weight:      228 pounds BMI:     35.84 BSA:     2.14 Temp:     98.4 degrees F oral Pulse rate:   80 / minute Pulse rhythm:   regular Resp:     18 per minute BP sitting:   140 / 100  (left arm) Cuff size:   large  Pt. in pain?   yes    Location:   left foot    Intensity:   9    Type:       sharp  Vitals Entered By: Arm[place] Shannon (April 28, 2008 8:46 AM)                  Chief Complaint:  pt is concerned about the results from the [facility]....  History of Present Illness: Here for f/u on left LE pain and rash. Just prior to office visit,MD spoke by phone with dermatologist,Dr.Stinehelfer at [facility]. The pathology report returned confirming leukoclastic vasculitis.Pt to f/u with them next week for suture removal from the skin BX. Dr.Stinehelfer received the labs from last week and only recommended checking a RF and Hepatitis panel as leukoclastic vasculitis can be associated with RA or Hepatitis(doubtful as pt has no other associated symptoms).  Pt says she went to ER over weekend(04/24/2008) as rash was worse up back of leg.She was evaluated and placed on prednisone and given some dilaudid for pain. 04/24/2008 ER prednisone 60 mg once daily x 7 days. Pt says pain is better on prednisone and she was told to discuss a longer steroid taper on f/u here. Since 2/20,pt denies any new lesions on leg or lesions/rash elsewhere.No lesions above her knee.Pain is better but not resolved.No other ill symptoms. She is proud to have quit smoking.    Prior Medications Reviewed Using: Medication Bottles  Updated Prior Medication List: pt stated that the hospital said she needs to take two Lisinopril a day Current Allergies (reviewed today): No known allergies   Past Medical History:    Reviewed history from 04/21/2008 and no changes required:       FIBROMYALGIA  (ICD-729.1)...per pt report       GERD (ICD-530.81       GOITER, MULTINODULAR (ICD-241.1)       GALLSTONES (ICD-574.20)       HYPERTENSION (ICD-401.9)       DIABETES MELLITUS, TYPE II (ICD-250.00)       DEPRESSION (ICD-311)..was seeing Dr.Sena at [facility] 2007,and Dr.Barbara Smith 2006.Marland Kitchendepression is chronic for decades. Also reports h/o PTSD per pt.       ANXIETY (ICD-300.00)         Past Surgical History:    Reviewed history from 02/23/2008 and no changes required:       Cholecystectomy 1990       Tubal ligation 2006      Physical Exam  General:     alert, appropriate dress, and cooperative to examination.   Lungs:     Normal respiratory effort, chest expands symmetrically. Lungs are clear to auscultation, no crackles or wheezes. Heart:     Normal rate and regular rhythm. S1 and S2 normal without gallop, murmur, click, rub or other extra sounds. Extremities:     No LE edema. Right DP pulse 2+ Left DP pulse 1+ Skin:     Left LE: Definitely has  more lesions compared to last week and these are somewhat confluent behind heel and up back of leg. No ulcerations. Lesions are red-purple palpaple purpura from a small pencil eraser size to quarter-sized. lesions are not painful to touch but patient still c/o an electrical/throbbing pain from great toe up leg. Foot is slightly cooler to touch on left compared to right when both are elevated but not significantly different.    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 Pt aware of f/u appt with Dr.Stinehelfer next week for suture removal. Pt aware that will take time for rash to gradually resolve. Pt remains concerned about her leg and is still c/o pain. As she has until recently been a smoker,will check her arterial dopplers. Will check Hepatitis panel and RF as per dermatologist's recommendations. Script for vicodin for pain  as needed. Pt is almost finished with prednisone from ER.As seems to be helping,will do a longer 12 day taper.  Orders: T-Hepatitis B Core Antibody ([id number]) T-Hepatitis A Antibody ([id number]) T-Hepatitis B Surface Antibody ([id number]) T-Hepatitis B Surface Antigen ([id number]) T-Hepatitis C Antibody ([id number]) T-Rheumatoid Factor ([id number]) LE Arterial Doppler/ABI (LE arterial doppler)   Problem # 2:  DIABETES MELLITUS, TYPE II (ICD-250.00) Poor control(HgA1c=11.6%) and will need f/u to address in 1-2 weeks. MD discussed with her that prednisone will increase her CBGs. Increase Lantus from 40  to 50 units once daily. Pt is willing to add Novolog flexpen at 10 units qac. Her updated medication list for this problem includes:    Metformin Hcl 1000 Mg Tabs (Metformin hcl) .Marland Kitchen... Take 1 tablet by mouth every 12 hours    Lisinopril-hydrochlorothiazide 20-12.5 Mg Tabs (Lisinopril-hydrochlorothiazide) .Marland Kitchen... Take 1 tablet by mouth every morning    Lantus Solostar 100 Unit/ml Soln (Insulin glargine) ..... Inject 50  units subcutaneously at bedtime    Novolog Flexpen 100 Unit/ml Soln (Insulin aspart) ..... Use 10 units prior to each meal   Complete Medication List: 1)  Metformin Hcl 1000 Mg Tabs (Metformin hcl) .... Take 1 tablet by mouth every 12 hours 2)  Potassium Chloride 20 Meq Pack (Potassium chloride) .... Take 1 by mouth qd 3)  Lisinopril-hydrochlorothiazide 20-12.5 Mg Tabs (Lisinopril-hydrochlorothiazide) .... Take 1 tablet by mouth every morning 4)  Trazodone Hcl 50 Mg Tabs (Trazodone hcl) .Marland Kitchen.. 1 by mouth at bedtime as needed insomnia 5)  Lantus Solostar 100 Unit/ml Soln (Insulin glargine) .... Inject 50  units subcutaneously at bedtime 6)  Celexa 40 Mg Tabs (Citalopram hydrobromide) .... Take 1 tablet by mouth once a day 7)  Omeprazole 20 Mg Cpdr (Omeprazole) .... Take 1 capsule by mouth at bedtime 8)  Glucometer and Supplies  .... Iddm check cbg two times a day and  record 9)  Glucomer Strips  .... Iddm check cbgs two times a day and record 10)  Insulin Syringes  11)  Wellbutrin Sr 150 Mg Xr12h-tab (Bupropion hcl) .... Take 1 tablet by mouth once a day 12)  Prednisone 20 Mg Tabs (Prednisone) .... Take three tabs by mouth every day for the next week 13)  Hydromorphone Hcl 4 Mg Tabs (Hydromorphone hcl) .... Take one tab by mouth every four hours as needed for pain 14)  Prednisone 10 Mg Tabs (Prednisone) .... On 05/02/08: take 4  tabs x 2d,3 tabs x2d,2 tabs x 2d,1 and 1/2 tabs x 2 d,1 tab x 2d,then 1/2 tab x 2d 15)  Vicodin 5-500 Mg Tabs (Hydrocodone-acetaminophen) .Marland KitchenMarland KitchenMarland Kitchen  Take 1-2 tablets by mouth every 6 hours as needed pain 16)  Novolog Flexpen 100 Unit/ml Soln (Insulin aspart) .... Use 10 units prior to each meal   Patient Instructions: 1)  Please schedule a follow-up appointment in 2 weeks for her diabetes.   Prescriptions: NOVOLOG FLEXPEN 100 UNIT/ML SOLN (INSULIN ASPART) Use 10 units prior to each meal  #1 x 1   Entered and Authorized by:   Beverley Fiedler MD   Signed by:   Beverley Fiedler MD on 04/28/2008   Method used:   Print then Give to Patient   RxID:   [id number] VICODIN 5-500 MG TABS (HYDROCODONE-ACETAMINOPHEN) Take 1-2 tablets by mouth every 6 hours as needed pain  #60 x 0   Entered and Authorized by:   Beverley Fiedler MD   Signed by:   Beverley Fiedler MD on 04/28/2008   Method used:   Print then Give to Patient   RxID:   [id number] PREDNISONE 10 MG  TABS (PREDNISONE) on 05/02/08: Take 4  tabs x 2d,3 tabs x2d,2 tabs x 2d,1 and 1/2 tabs x 2 d,1 tab x 2d,then 1/2 tab x 2d  #30 x 0   Entered and Authorized by:   Beverley Fiedler MD   Signed by:   Beverley Fiedler MD on 04/28/2008   Method used:   Print then Give to Patient   RxID:   [id number]   Laboratory Results   Blood Tests   Date/Time Received: April 28, 2008 8:57 AM  Date/Time Reported: April 28, 2008 8:57 AM   HGBA1C: 11.6%   (Normal Range:  Non-Diabetic - 3-6%   Control Diabetic - 6-8%) CBG Fasting:: 294mg /dL

## 2010-04-04 NOTE — Letter (Signed)
Summary: VASCULAR & VEIN/OFFICE VISIT  VASCULAR & VEIN/OFFICE VISIT   Imported By: Arta Bruce 06/18/2008 11:47:54  _____________________________________________________________________  External Attachment:    Type:   Image     Comment:   External Document

## 2010-04-04 NOTE — Assessment & Plan Note (Signed)
Summary: FU ON HER FOOT////KT   Vital Signs:  Patient profile:   45 year old female LMP:     06/27/2008 Weight:      236 pounds Temp:     99.7 degrees F Pulse rate:   84 / minute Pulse rhythm:   regular Resp:     20 per minute BP sitting:   160 / 102  (left arm) Cuff size:   large  Vitals Entered By: Vesta Mixer CMA (June 28, 2008 10:52 AM) CC: f/u on problems with her leg/foot she will be having her toes amputated Is Patient Diabetic? Yes  Pain Assessment Patient in pain? yes     Location: toes Intensity: 9 CBG Result 203  Does patient need assistance? Ambulation Normal LMP (date): 06/27/2008 LMP - Character: normal  years   days  Enter LMP: 06/27/2008   History of Present Illness: Pt last seen here  04/28/08 just prior to hospitalization. Limited hospital records at time of this visit...will request. Per office note from vascular surgeon,Dr.Fields, patient had a left common femoral artery endarterectomy and left femoral to above knee bypass with vein graft on 04/29/2008. She was discharged home but admitted again 05/13/2008 for on-going left foot pain. Per OV note from Dr.Fields,pt was seen during 05/2008 hospitalization by Dr.Anderson,rheumatologist. 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 We have a note stating that Dr.Fields' office tried to get rheumatology f/u for patient but was told no new medicaid patients. Pt is a good historian and says that Dr.Anderson told her he would see her in f/u at his office but she could not get an appt when she called.  Pt remains very worried that her vasculitis could worsen and result in problems with her right  leg. She developed new cutaneous lesions over the last few weeks on her right leg and this worried her.She discussed with Dr.Fields who told her that her circulation was doing fine on her right   leg.However,she is worried that she has no further refills on her steroid and that she has a few new red bumps on left and right leg.This is how the vasculitis lesions start. She reports that pain is better and not her main issue.  Her diabetes is not well-controlled...especially with steroid use.Emphasized that Diabetic control is key for healing and close f/u is needed until DM doing well. Says has on levemir 50 units once daily and Novolog  ~10 units qac...   Pt had been placed on cymbalta in hospital but didn't get it rewritten...they said she would need it done by PCP per patient.  Allergies (verified): 1)  ! Codeine  Past History:  Past Medical History:    Current Problems:     PERIPHERAL VASCULAR DISEASE (ICD-443.9)...04/29/2008    LEUKOCYTOCLASTIC VASCULITIS (ICD-446.29)...DERM and path 04/21/2008.Rheum,Dr.Anderson 05/2008.    VASCULAR PURPURA (ICD-287.2)    GERD (ICD-530.81)    GOITER, MULTINODULAR (ICD-241.1)    GALLSTONES (ICD-574.20)    FIBROMYALGIA (ICD-729.1)    HYPERTENSION (ICD-401.9)    WEIGHT GAIN (ICD-783.1)    DIABETES MELLITUS, TYPE II (ICD-250.00)    DEPRESSION (ICD-311)..was seeing Dr.Sena at [facility] 2007,and Dr.Barbara Smith 2006.Marland Kitchendepression is chronic for decades. Also reports h/o PTSD per pt.    ANXIETY (ICD-300.00)  Past Surgical History:    Cholecystectomy 1990    Tubal ligation 2006    s/p left femoral to above knee bypass with left femoral endarterectomy 04/29/2008.  Physical Exam  General:  alert, well-developed, and appropriate dress.   Anxious. Lungs:  Normal respiratory effort, chest expands symmetrically. Lungs are clear to auscultation, no crackles or wheezes. Heart:  Normal rate and regular rhythm. S1 and S2 normal without gallop, murmur, click, rub or other extra sounds. Extremities:  Left foot: in extensive wound dressing. Right foot/leg: foot warm and DP pulse palpable.Has scattered erythematous papules on lower anterior shin plus evidence of  resolved lesions with resulting post-inflammatory hyperpigmentary changes in skin. No discharge and CR< 2 sec.   Impression & Recommendations:  Problem # 1:  DIABETES MELLITUS, TYPE II (ICD-250.00) Needs tighter control. Will switch from Lantus to Levemir...and change to two times a day dosing at higher total of 30 units two times a day. Initiate more specific and tighter qac sliding scale Novolog insulin. Pt must keep CBG log and bring to f/u office visits.  The following medications were removed from the medication list:    Lantus Solostar 100 Unit/ml Soln (Insulin glargine) ..... Inject 50  units subcutaneously at bedtime Her updated medication list for this problem includes:    Metformin Hcl 1000 Mg Tabs (Metformin hcl) .Marland Kitchen... Take 1 tablet by mouth every 12 hours    Lisinopril-hydrochlorothiazide 20-12.5 Mg Tabs (Lisinopril-hydrochlorothiazide) .Marland Kitchen... Take 2 tablets  by mouth every morning    Novolog Flexpen 100 Unit/ml Soln (Insulin aspart) .Marland Kitchen... Per sliding scale prior to each meal.    Levemir Flexpen 100 Unit/ml Soln (Insulin detemir) .Marland Kitchen... Take 30 units subcutaneously bid    Novolog 100 Unit/ml Soln (Insulin aspart) .Marland Kitchen... Check blood sugar prior to meal: if 80-100 do 10 units,101-150 do 12 units,151-200 15 units,201-250 20 units,over 250 do 25 units  Orders: Fingerstick ([address])  Problem # 2:  PERIPHERAL VASCULAR DISEASE (ICD-443.9) Pt to continue f/u visits and wound care per vascular surgeon,Dr.Fields.  Problem # 3:  LEUKOCYTOCLASTIC VASCULITIS (UEA-540.98) MD called Dr.Truslow,rheum, who suggested I call and speak to [facility] directly. MD discussed with patient that as she says she has new lesions and she has not seen a rheumatologist since hospitalization,we will increase her prednisone to 60 mg once daily and work to get her seen by a rheumatologist.  Problem # 4:  HYPERTENSION (ICD-401.9) Not controlled. Will increase Lisinopril/HCTZ 20/12.5 mg to 2 tablets by mouth   every morning.  Her updated medication list for this problem includes:    Lisinopril-hydrochlorothiazide 20-12.5 Mg Tabs (Lisinopril-hydrochlorothiazide) .Marland Kitchen... Take 2 tablets  by mouth every morning  Complete Medication List: 1)  Metformin Hcl 1000 Mg Tabs (Metformin hcl) .... Take 1 tablet by mouth every 12 hours 2)  Potassium Chloride Cr 10 Meq Cr-tabs (Potassium chloride) .... Take 2 tablets by mouth once daily 3)  Lisinopril-hydrochlorothiazide 20-12.5 Mg Tabs (Lisinopril-hydrochlorothiazide) .... Take 2 tablets  by mouth every morning 4)  Omeprazole 20 Mg Cpdr (Omeprazole) .... Take 1 capsule by mouth at bedtime 5)  Glucometer and Supplies  .... Iddm check cbg two times a day and record 6)  Glucomer Strips  .... Iddm check cbgs two times a day and record 7)  Insulin Syringes  8)  Wellbutrin Sr 150 Mg Xr12h-tab (Bupropion hcl) .... Take 1 tablet by mouth once a day 9)  Prednisone 20 Mg Tabs (Prednisone) .... Take three tabs by mouth every day 10)  Novolog Flexpen 100 Unit/ml Soln (Insulin aspart) .... Per sliding scale prior to each meal. 11)  Santyl 250 Unit/gm Oint (Collagenase) .... Apply once daily 12)  Restore Hydrogel Dressing Gel (Wound dressings) .Marland KitchenMarland KitchenMarland Kitchen  As needed to inscision 13)  Percocet 5-325 Mg Tabs (Oxycodone-acetaminophen) .... Take 1-2 tablets by mouth every 4-6 hours as needed pain pain(dr.fields) 14)  Levemir Flexpen 100 Unit/ml Soln (Insulin detemir) .... Take 30 units subcutaneously bid 15)  Novolog 100 Unit/ml Soln (Insulin aspart) .... Check blood sugar prior to meal: if 80-100 do 10 units,101-150 do 12 units,151-200 15 units,201-250 20 units,over 250 do 25 units  Patient Instructions: 1)  See Dr.Rayn Shorb in 2 weeks for f/u on diabetes. 2)  New long-acting insulin is Levemir at 30 units two times a day. 3)  Sliding scale Novolog prior to each meal. 4)  Remember to take 2 Lisinopril/HCTZ tablets each morning. Prescriptions: LEVEMIR FLEXPEN 100 UNIT/ML SOLN (INSULIN  DETEMIR) Take 30 units Subcutaneously BID  #2 x 0   Entered and Authorized by:   Beverley Fiedler MD   Signed by:   Beverley Fiedler MD on 06/28/2008   Method used:   Samples Given   RxID:   [id number] NOVOLOG FLEXPEN 100 UNIT/ML SOLN (INSULIN ASPART) Per sliding scale prior to each meal.  #1 x 1   Entered and Authorized by:   Beverley Fiedler MD   Signed by:   Beverley Fiedler MD on 06/28/2008   Method used:   Samples Given   RxID:   [id number] PREDNISONE 20 MG TABS (PREDNISONE) Take three tabs by mouth every day  #100 x 0   Entered and Authorized by:   Beverley Fiedler MD   Signed by:   Beverley Fiedler MD on 06/28/2008   Method used:   Print then Give to Patient   RxID:   [id number] NOVOLOG 100 UNIT/ML SOLN (INSULIN ASPART) Check Blood sugar prior to meal: If 80-100 do 10 units,101-150 do 12 units,151-200 15 units,201-250 20 units,Over 250 do 25 units  #1 month x 3   Entered and Authorized by:   Beverley Fiedler MD   Signed by:   Beverley Fiedler MD on 06/28/2008   Method used:   Print then Give to Patient   RxID:   [id number] WELLBUTRIN SR 150 MG XR12H-TAB (BUPROPION HCL) Take 1 tablet by mouth once a day  #30 x 2   Entered and Authorized by:   Beverley Fiedler MD   Signed by:   Beverley Fiedler MD on 06/28/2008   Method used:   Print then Give to Patient   RxID:   [id number] OMEPRAZOLE 20 MG CPDR (OMEPRAZOLE) Take 1 capsule by mouth at bedtime  #30 x 3   Entered and Authorized by:   Beverley Fiedler MD   Signed by:   Beverley Fiedler MD on 06/28/2008   Method used:   Print then Give to Patient   RxID:   [id number] POTASSIUM CHLORIDE CR 10 MEQ CR-TABS (POTASSIUM CHLORIDE) Take 2 tablets by mouth once daily  #60 x 2   Entered and Authorized by:   Beverley Fiedler MD   Signed by:   Beverley Fiedler MD on 06/28/2008   Method used:   Print then Give to Patient   RxID:   [id number] METFORMIN HCL 1000 MG  TABS (METFORMIN HCL) Take 1 tablet by  mouth every 12 hours  #60 x 4   Entered and Authorized by:   Beverley Fiedler MD   Signed by:   Beverley Fiedler MD on 06/28/2008   Method used:   Print then Give to Patient   RxID:   [id number] LEVEMIR FLEXPEN 100 UNIT/ML SOLN (INSULIN DETEMIR) Take 30 units Subcutaneously BID  #1 month x 2  Entered and Authorized by:   Beverley Fiedler MD   Signed by:   Beverley Fiedler MD on 06/28/2008   Method used:   Print then Give to Patient   RxID:   [id number] LISINOPRIL-HYDROCHLOROTHIAZIDE 20-12.5 MG TABS (LISINOPRIL-HYDROCHLOROTHIAZIDE) Take 2 tablets  by mouth every morning  #60 x 4   Entered and Authorized by:   Beverley Fiedler MD   Signed by:   Beverley Fiedler MD on 06/28/2008   Method used:   Print then Give to Patient   RxID:   [id number]

## 2010-04-04 NOTE — Progress Notes (Signed)
Summary: Spoke with Dance movement psychotherapist of Call: Spoke with Dr.Anderson at [facility]. He was anticipating that she would be seen in office f/u. He was actually wondering where she was. We discussed lesions on right leg and he said she had had these when in hospital. She had been put on prednisone 10 mg once daily.He felt that prednisone at 60 mg once daily was fine for this  week and he said he would see her in f/u in the office next week. When I spoke to his front desk staff,Jesse,she said that they weren't taking any new medicaid pts..At this point,I asked her to please d/w Dr.Anderson and call our office back with appt as "I was getting stuck in the middle".  Initial call taken by: Beverley Fiedler MD,  June 28, 2008 2:53 PM  Follow-up for Phone Call        Spoke with Verdon Cummins [phone] who says she spoke with [facility] but had to have her office manager "put in the computer"....she said to speak to the office manager,Cindy....she never came to phone.Marland KitchenMarland KitchenBeverley Fiedler MD  June 29, 2008 10:46 AM   Additional Follow-up for Phone Call Additional follow up Details #1::        Md called back but office manager at lunch so I left a detailed message requesting a call back today.Beverley Fiedler MD  June 29, 2008 12:55 PM.    Additional Follow-up for Phone Call Additional follow up Details #2::    MD spoke with Cidi,who called back.Marland KitchenMarland KitchenPt is scheduled to see Dr.Anderson on May 4 at 12:30pm. Cindi says she already spoke to patient and pt is aware of appt date and time. Follow-up by: Beverley Fiedler MD,  June 29, 2008 5:47 PM

## 2010-04-04 NOTE — Assessment & Plan Note (Signed)
Summary: 2 WEEK DM FU///KT   Vital Signs:  Patient profile:   45 year old female Height:      67 inches Temp:     98.3 degrees F oral Pulse rate:   88 / minute Pulse rhythm:   regular Resp:     18 per minute BP sitting:   140 / 94  (left arm) Cuff size:   large  Vitals Entered By: [place] Shannon (Jul 21, 2008 11:55 AM)  History of Present Illness: Here for f/u on her DM. Pt says she saw Dr.Anderson,rheumatology, as scheduled on 07/06/2008. Consult note not yet available. Pt says dr.Anderson told her that vasculitis and peripheral vascular disease were 2 separate issues that just occurred at the same time. Dr.Anderson did not think skin lesions were worse and has tapered her down on her steroids to prednisone 20 mg once daily.   Has had toe surgeries 5/10-5/12. Pt has a Home visiting nurse for dressings and then will decrease to ~ 3 days per week.  CBGs are 219-230's on checking. Pt reports compliance with insulin.  Current Medications (verified): 1)  Metformin Hcl 1000 Mg  Tabs (Metformin Hcl) .... Take 1 Tablet By Mouth Every 12 Hours 2)  Potassium Chloride Cr 10 Meq Cr-Tabs (Potassium Chloride) .... Take 2 Tablets By Mouth Once Daily 3)  Lisinopril-Hydrochlorothiazide 20-12.5 Mg Tabs (Lisinopril-Hydrochlorothiazide) .... Take 2 Tablets  By Mouth Every Morning 4)  Omeprazole 20 Mg Cpdr (Omeprazole) .... Take 1 Capsule By Mouth At Bedtime 5)  Glucometer and Supplies .... Iddm Check Cbg Two Times A Day and Record 6)  Glucomer Strips .... Iddm Check Cbgs Two Times A Day and Record 7)  Insulin Syringes 8)  Wellbutrin Sr 150 Mg Xr12h-Tab (Bupropion Hcl) .... Take 1 Tablet By Mouth Once A Day 9)  Prednisone 20 Mg Tabs (Prednisone) .... Take Three Tabs By Mouth Every Day 10)  Novolog Flexpen 100 Unit/ml Soln (Insulin Aspart) .... Per Sliding Scale Prior To Each Meal. 11)  Santyl 250 Unit/gm Oint (Collagenase) .... Apply Once Daily 12)  Restore Hydrogel Dressing  Gel (Wound Dressings)  .... As Needed To Inscision 13)  Percocet 5-325 Mg Tabs (Oxycodone-Acetaminophen) .... Take 1-2 Tablets By Mouth Every 4-6 Hours As Needed Pain Pain(Dr.fields) 14)  Levemir Flexpen 100 Unit/ml Soln (Insulin Detemir) .... Take 30 Units Subcutaneously Bid 15)  Novolog 100 Unit/ml Soln (Insulin Aspart) .... Check Blood Sugar Prior To Meal: If 80-100 Do 10 Units,101-150 Do 12 Units,151-200 15 Units,201-250 20 Units,over 250 Do 25 Units 16)  Celexa 40 Mg Tabs (Citalopram Hydrobromide) .... Take 1 Tablet By Mouth Once A Day  Allergies (verified): 1)  ! Codeine  Past History:  Past Medical History:    Current Problems:     PERIPHERAL VASCULAR DISEASE (ICD-443.9)...04/29/2008    LEUKOCYTOCLASTIC VASCULITIS (ICD-446.29)...DERM and path 04/21/2008.Rheum,Dr.Anderson 05/2008.    VASCULAR PURPURA (ICD-287.2)    GERD (ICD-530.81)    GOITER, MULTINODULAR (ICD-241.1)    GALLSTONES (ICD-574.20)    FIBROMYALGIA (ICD-729.1)    HYPERTENSION (ICD-401.9)    WEIGHT GAIN (ICD-783.1)    DIABETES MELLITUS, TYPE II (ICD-250.00)    DEPRESSION (ICD-311)..was seeing Dr.Sena at [facility] 2007,and Dr.Barbara Smith 2006.Marland Kitchendepression is chronic for decades. Also reports h/o PTSD per pt.    ANXIETY (ICD-300.00)     (06/28/2008)  Past Surgical History:    Reviewed history from 06/28/2008 and no changes required:    Cholecystectomy 1990    Tubal ligation 2006    s/p left femoral to above  knee bypass with left femoral endarterectomy 04/29/2008.  Physical Exam  General:  alert, appropriate dress, and good hygiene.   Lungs:  Normal respiratory effort, chest expands symmetrically. Lungs are clear to auscultation, no crackles or wheezes. Heart:  Normal rate and regular rhythm. S1 and S2 normal without gallop, murmur, click, rub or other extra sounds. Skin:  healing papular lesions both legs...improved from last visit.Will have post-inlammatory hyperpimentation even after all papular lesions have resolved.   Impression &  Recommendations:  Problem # 1:  PERIPHERAL VASCULAR DISEASE (ICD-443.9) s/p amputation of 1st,2nd, and third toes left foot by Dr.Fields 07/12/2008. Currently in surgical dressing. pt has seen Dr.Fields and reports she is doing well.  Problem # 2:  LEUKOCYTOCLASTIC VASCULITIS (ICD-446.29) Saw Dr.Anderson, rheumatologist, and prednisone tapered to 20 mg once daily. Pt says has f/u.  Problem # 3:  DIABETES MELLITUS, TYPE II (ICD-250.00) Needs tighter control. Increase Levemir to 35 units two times a day. Use SSI prio to each meal. Keep CBG diary and bring to f/u visit.  Her updated medication list for this problem includes:    Metformin Hcl 1000 Mg Tabs (Metformin hcl) .Marland Kitchen... Take 1 tablet by mouth every 12 hours    Lisinopril-hydrochlorothiazide 20-12.5 Mg Tabs (Lisinopril-hydrochlorothiazide) .Marland Kitchen... Take 2 tablets  by mouth every morning    Novolog Flexpen 100 Unit/ml Soln (Insulin aspart) .Marland Kitchen... Per sliding scale prior to each meal.    Levemir Flexpen 100 Unit/ml Soln (Insulin detemir) .Marland Kitchen... Take 35 units subcutaneously bid    Novolog 100 Unit/ml Soln (Insulin aspart) .Marland Kitchen... Check blood sugar prior to meal: if 80-100 do 10 units,101-150 do 12 units,151-200 15 units,201-250 20 units,over 250 do 25 units  Complete Medication List: 1)  Metformin Hcl 1000 Mg Tabs (Metformin hcl) .... Take 1 tablet by mouth every 12 hours 2)  Potassium Chloride Cr 10 Meq Cr-tabs (Potassium chloride) .... Take 2 tablets by mouth once daily 3)  Lisinopril-hydrochlorothiazide 20-12.5 Mg Tabs (Lisinopril-hydrochlorothiazide) .... Take 2 tablets  by mouth every morning 4)  Omeprazole 20 Mg Cpdr (Omeprazole) .... Take 1 capsule by mouth at bedtime 5)  Glucometer and Supplies  .... Iddm check cbg two times a day and record 6)  Glucomer Strips  .... Iddm check cbgs two times a day and record 7)  Insulin Syringes  8)  Wellbutrin Sr 150 Mg Xr12h-tab (Bupropion hcl) .... Take 1 tablet by mouth once a day 9)  Prednisone  20 Mg Tabs (Prednisone) .... Take 1 tablet. every day 10)  Novolog Flexpen 100 Unit/ml Soln (Insulin aspart) .... Per sliding scale prior to each meal. 11)  Santyl 250 Unit/gm Oint (Collagenase) .... Apply once daily 12)  Restore Hydrogel Dressing Gel (Wound dressings) .... As needed to inscision 13)  Percocet 5-325 Mg Tabs (Oxycodone-acetaminophen) .... Take 1 tablets by mouth every 4-6 hours as needed pain pain(dr.fields) 14)  Levemir Flexpen 100 Unit/ml Soln (Insulin detemir) .... Take 35 units subcutaneously bid 15)  Novolog 100 Unit/ml Soln (Insulin aspart) .... Check blood sugar prior to meal: if 80-100 do 10 units,101-150 do 12 units,151-200 15 units,201-250 20 units,over 250 do 25 units 16)  Celexa 40 Mg Tabs (Citalopram hydrobromide) .... Take 1 tablet by mouth once a day  Patient Instructions: 1)  see Dr.Jaiyla Granados on 07/30/2008 for DM. 2)  Bring blood sugar diary...goal is 90-120 fasting and all others 90-140. 3)  Levemir 35 units two times a day.(correction). 4)  Do your Novolog before each meal.    Laboratory Results  Blood Tests   Date/Time Received: Jul 21, 2008 12:03 PM   CBG Random:: 231mg /dL

## 2010-04-04 NOTE — Assessment & Plan Note (Signed)
Summary: dm/ medical refills//gk   Vital Signs:  Patient Profile:   45 Years Old Female LMP:     02/10/2008 Height:     67 inches Weight:      228 pounds BMI:     35.84 BSA:     2.14 Temp:     97.1 degrees F oral Pulse rate:   88 / minute Pulse rhythm:   regular Resp:     18 per minute BP sitting:   138 / 86  (left arm) Cuff size:   regular  Pt. in pain?   yes    Location:   chest    Intensity:   7    Type:       pressure  Vitals Entered By: Armenia Shannon (February 23, 2008 8:38 AM)  Menstrual History: LMP (date): 02/10/2008 LMP - Character: normal              Is Patient Diabetic? Yes Did you bring your meter with you today? No CBG Result 298     Chief Complaint:  pt is concerned about her sugar level... pt stated she needs something so she can rest since the pain in her chest is keeping her up late.  History of Present Illness: Here to re-establish care. Was seeing Dr.Richter with last visit 07/18/2005. So, she had insurance and lost it 9/09. No labs in over a year. Pt was to be on synthroid by patient report...but she describes possible hyperthyroidism. Has known GERD;worse at night.Not on any medicine for reflux.  Feels overwhelmed. Denies suicidal or homicidal thoughts.    Prior Medications Reviewed Using: Patient Recall  Current Allergies: ! NEURONTIN  Past Medical History:    Reviewed history from 12/19/2006 and no changes required:       FIBROMYALGIA (ICD-729.1)...per pt report       GERD (ICD-530.81)       CHEST PAIN, NON-CARDIAC (ICD-786.59)       GOITER, MULTINODULAR (ICD-241.1)       GALLSTONES (ICD-574.20)       FIBROMYALGIA (ICD-729.1)       HYPERTENSION (ICD-401.9)       WEIGHT GAIN (ICD-783.1)       DIABETES MELLITUS, TYPE II (ICD-250.00)       DEPRESSION (ICD-311)..was seeing Dr.Sena at [facility] 2007,and Dr.Barbara Smith 2006.Marland Kitchendepression is chronic for decades. Also reports h/o PTSD per pt.       ANXIETY (ICD-300.00)         Past  Surgical History:    Reviewed history and no changes required:       Cholecystectomy 1990       Tubal ligation 2006   Family History:    Mother DM,depression    Father DM,depression     Physical Exam  General:     Well-developed,well-nourished,in no acute distress; alert,appropriate and cooperative throughout examination Neck:     some mild thyroidmegaly. Lungs:     Normal respiratory effort, chest expands symmetrically. Lungs are clear to auscultation, no crackles or wheezes. Heart:     Normal rate and regular rhythm. S1 and S2 normal without gallop, murmur, click, rub or other extra sounds. Psych:      Oriented X3, memory intact for recent and remote, normally interactive, good eye contact, and not anxious appearing.  Some flat affect.    Impression & Recommendations:  Problem # 1:  DIABETES MELLITUS, TYPE II (ICD-250.00) HgA1c=12.5% Last Pneumovax 07/2005. Last Td 12/06. Schedule Mammogram. Schedule CPP. Schedule Retasure.  The following medications were  removed from the medication list:    Lisinopril 20 Mg Tabs (Lisinopril) .Marland Kitchen... Take 1 by mouth qd    Actos 30 Mg Tabs (Pioglitazone hcl) .Marland Kitchen... Take 1 by mouth qd  Her updated medication list for this problem includes:    Metformin Hcl 1000 Mg Tabs (Metformin hcl) .Marland Kitchen... Take 1 tablet by mouth every 12 hours    Lisinopril-hydrochlorothiazide 20-12.5 Mg Tabs (Lisinopril-hydrochlorothiazide) .Marland Kitchen... Take 1 tablet by mouth every morning    Lantus Solostar 100 Unit/ml Soln (Insulin glargine) ..... Inject 40 units subcutaneously at bedtime  Orders: T-General Health Panel (CBCD, CMP, TSH) ([address]) T-Lipid Profile (40981-19147)   Problem # 2:  HYPERTENSION (ICD-401.9)  The following medications were removed from the medication list:    Lisinopril 20 Mg Tabs (Lisinopril) .Marland Kitchen... Take 1 by mouth qd  Her updated medication list for this problem includes:    Lisinopril-hydrochlorothiazide 20-12.5 Mg Tabs  (Lisinopril-hydrochlorothiazide) .Marland Kitchen... Take 1 tablet by mouth every morning   Problem # 3:  GERD (ICD-530.81)  Her updated medication list for this problem includes:    Omeprazole 20 Mg Cpdr (Omeprazole) .Marland Kitchen... Take 1 capsule by mouth at bedtime   Problem # 4:  DEPRESSION (ICD-311) Pt to see Ethelene Browns for assistance with return to her mental health providers at [facility].Refills given for limited time as she will need to get further meds through her mental health provider.   Her updated medication list for this problem includes:    Trazodone Hcl 50 Mg Tabs (Trazodone hcl) .Marland Kitchen... 1 by mouth at bedtime as needed insomnia    Celexa 40 Mg Tabs (Citalopram hydrobromide) .Marland Kitchen... Take 1 tablet by mouth once a day    Wellbutrin Xl 150 Mg Xr24h-tab (Bupropion hcl) .Marland Kitchen... Take 1 tablet by mouth once a day  Orders: Psychology Referral (Psychology)  The following medications were removed from the medication list:    Alprazolam 0.5 Mg Tb24 (Alprazolam) .Marland Kitchen... Take 1 by mouth qid prn  Her updated medication list for this problem includes:    Trazodone Hcl 50 Mg Tabs (Trazodone hcl) .Marland Kitchen... 1 by mouth at bedtime as needed insomnia    Celexa 40 Mg Tabs (Citalopram hydrobromide) .Marland Kitchen... Take 1 tablet by mouth once a day    Wellbutrin Xl 150 Mg Xr24h-tab (Bupropion hcl) .Marland Kitchen... Take 1 tablet by mouth once a day   Complete Medication List: 1)  Metformin Hcl 1000 Mg Tabs (Metformin hcl) .... Take 1 tablet by mouth every 12 hours 2)  Potassium Chloride 20 Meq Pack (Potassium chloride) .... Take 1 by mouth qd 3)  Lisinopril-hydrochlorothiazide 20-12.5 Mg Tabs (Lisinopril-hydrochlorothiazide) .... Take 1 tablet by mouth every morning 4)  Trazodone Hcl 50 Mg Tabs (Trazodone hcl) .Marland Kitchen.. 1 by mouth at bedtime as needed insomnia 5)  Lantus Solostar 100 Unit/ml Soln (Insulin glargine) .... Inject 40 units subcutaneously at bedtime 6)  Celexa 40 Mg Tabs (Citalopram hydrobromide) .... Take 1 tablet by mouth once a day 7)   Wellbutrin Xl 150 Mg Xr24h-tab (Bupropion hcl) .... Take 1 tablet by mouth once a day 8)  Omeprazole 20 Mg Cpdr (Omeprazole) .... Take 1 capsule by mouth at bedtime 9)  Glucometer and Supplies  .... Iddm check cbg two times a day and record 10)  Glucomer Strips  .... Iddm check cbgs two times a day and record 11)  Insulin Syringes   Other Orders: Mammogram (Screening) (Mammo) Flu Vaccine 35yrs + (82956) Admin 1st Vaccine (21308) Influenza virus vaccine- pandemic formulation (H1N1) (65784)   Patient Instructions:  1)  Schedule Retasure scan. 2)  Schedule for CPP. 3)  Seasonal FLU shot today 4)  Foot check today 5)  Schedule to see Harrel Lemon. 6)  Schedule mammogram. 7)  Labwork today. 8)  Try [facility] for Medications.   Prescriptions: INSULIN SYRINGES   #30 x 11   Entered and Authorized by:   Beverley Fiedler MD   Signed by:   Beverley Fiedler MD on 02/23/2008   Method used:   Print then Give to Patient   RxID:   [id number] JYNWGNFA STRIPS IDDM Check CBGs two times a day and record  #100 x 11   Entered and Authorized by:   Beverley Fiedler MD   Signed by:   Beverley Fiedler MD on 02/23/2008   Method used:   Print then Give to Patient   RxID:   [id number] GLUCOMETER AND SUPPLIES IDDM Check CBG two times a day and record  #1 x 0   Entered and Authorized by:   Beverley Fiedler MD   Signed by:   Beverley Fiedler MD on 02/23/2008   Method used:   Print then Give to Patient   RxID:   [id number] TRAZODONE HCL 50 MG TABS (TRAZODONE HCL) 1 by mouth at bedtime as needed insomnia  #30 x 3   Entered and Authorized by:   Beverley Fiedler MD   Signed by:   Beverley Fiedler MD on 02/23/2008   Method used:   Print then Give to Patient   RxID:   [id number] LISINOPRIL-HYDROCHLOROTHIAZIDE 20-12.5 MG TABS (LISINOPRIL-HYDROCHLOROTHIAZIDE) Take 1 tablet by mouth every morning  #30 x 3   Entered and Authorized by:   Beverley Fiedler MD   Signed by:   Beverley Fiedler MD on 02/23/2008   Method used:   Print then Give to Patient   RxID:   [id number] POTASSIUM CHLORIDE 20 MEQ  PACK (POTASSIUM CHLORIDE) take 1 by mouth qd  #30 x 3   Entered and Authorized by:   Beverley Fiedler MD   Signed by:   Beverley Fiedler MD on 02/23/2008   Method used:   Print then Give to Patient   RxID:   [id number] METFORMIN HCL 1000 MG  TABS (METFORMIN HCL) Take 1 tablet by mouth every 12 hours  #60 x 3   Entered and Authorized by:   Beverley Fiedler MD   Signed by:   Beverley Fiedler MD on 02/23/2008   Method used:   Print then Give to Patient   RxID:   [id number] METFORMIN HCL 1000 MG  TABS (METFORMIN HCL) take 1 by mouth qd  #0 x 0   Entered and Authorized by:   Beverley Fiedler MD   Signed by:   Beverley Fiedler MD on 02/23/2008   Method used:   Print then Give to Patient   RxID:   [id number] OMEPRAZOLE 20 MG CPDR (OMEPRAZOLE) Take 1 capsule by mouth at bedtime  #30 x 3   Entered and Authorized by:   Beverley Fiedler MD   Signed by:   Beverley Fiedler MD on 02/23/2008   Method used:   Print then Give to Patient   RxID:   [id number] WELLBUTRIN XL 150 MG XR24H-TAB (BUPROPION HCL) Take 1 tablet by mouth once a day  #1 month x 2   Entered and Authorized by:   Beverley Fiedler MD   Signed by:   Beverley Fiedler MD on 02/23/2008   Method used:   Print then Give to Patient   RxID:  [id number] CELEXA 40 MG TABS (CITALOPRAM HYDROBROMIDE) Take 1 tablet by mouth once a day  #30 x 2   Entered and Authorized by:   Beverley Fiedler MD   Signed by:   Beverley Fiedler MD on 02/23/2008   Method used:   Print then Give to Patient   RxID:   [id number] LANTUS SOLOSTAR 100 UNIT/ML  SOLN (INSULIN GLARGINE) Inject 40 units Subcutaneously at bedtime  #1 month x 3   Entered and Authorized by:   Beverley Fiedler MD   Signed by:   Beverley Fiedler MD on 02/23/2008   Method used:   Print then Give to  Patient   RxID:   [id number]  Recommended Pt try [facility].] Laboratory Results   Blood Tests   Date/Time Received: February 23, 2008 8:40 AM  Date/Time Reported: February 23, 2008 8:40 AM   HGBA1C: 12.5%   (Normal Range: Non-Diabetic - 3-6%   Control Diabetic - 6-8%) CBG Random:: 298mg /dL      Influenza Vaccine    Vaccine Type: Fluvax 3+    Site: left deltoid    Mfr: Sanofi Pasteur    Dose: 0.5 ml    Route: IM    Given by: Vesta Mixer CMA    Exp. Date: 09/01/2008    Lot #: [id number]    VIS given: 09/26/06 version given February 23, 2008.  Flu Vaccine Consent Questions    Do you have a history of severe allergic reactions to this vaccine? no    Any prior history of allergic reactions to egg and/or gelatin? no    Do you have a sensitivity to the preservative Thimersol? no    Do you have a past history of Guillan-Barre Syndrome? no    Do you currently have an acute febrile illness? no    Have you ever had a severe reaction to latex? no    Vaccine information given and explained to patient? yes    Are you currently pregnant? no  H1N1 # 1    Vaccine Type: Influenza virus vaccine- pandemic formulation (H1N1)    Site: right deltoid    Mfr: Sanofi Pasteur    Dose: 0.5 ml    Route: IM    Given by: Vesta Mixer CMA    Exp. Date: 07/11/2009    Lot #: [id number]    VIS given: 12/03/2008 given February 23, 2008.

## 2010-04-04 NOTE — Progress Notes (Signed)
Summary: LOST ALL HER SCRIPTS  Phone Note Call from Patient Call back at Home Phone [phone]   Reason for Call: Refill Medication Summary of Call: DR Barbaraann Barthel MS [place] SAYS SHE CAN'T FIND ANY OF THE SCRIPTS THAT YOU GAVE HER ON 04/26, AND SHE WOULD LIKE FOR THEM TO BE CALLED INTO [facility]. Initial call taken by: Leodis Rains,  Jul 07, 2008 10:43 AM  Follow-up for Phone Call        if you reprint them i will fax them off Follow-up by: Armenia Shannon,  Jul 08, 2008 10:15 AM  Additional Follow-up for Phone Call Additional follow up Details #1::        I would prefer that she use [facility]..but it is her choice.Marland KitchenMarland KitchenCMA can call/fax all 4/26 refilled meds in.Marland KitchenPROVIDER: Beverley Fiedler MD did NOT refill prednisone as she should have had this addressed by Dr.Anderson,rheumatologist, just this week. Additional Follow-up by: Beverley Fiedler MD,  Jul 08, 2008 4:54 PM    Additional Follow-up for Phone Call Additional follow up Details #2::    [facility] into [facility]...................    Prescriptions: CELEXA 40 MG TABS (CITALOPRAM HYDROBROMIDE) Take 1 tablet by mouth once a day  #30 x 2   Entered and Authorized by:   Beverley Fiedler MD   Signed by:   Beverley Fiedler MD on 07/08/2008   Method used:   Printed then faxed to ...         RxID:   [id number] NOVOLOG 100 UNIT/ML SOLN (INSULIN ASPART) Check Blood sugar prior to meal: If 80-100 do 10 units,101-150 do 12 units,151-200 15 units,201-250 20 units,Over 250 do 25 units  #1 month x 3   Entered and Authorized by:   Beverley Fiedler MD   Signed by:   Beverley Fiedler MD on 07/08/2008   Method used:   Printed then faxed to ...         RxID:   [id number] WELLBUTRIN SR 150 MG XR12H-TAB (BUPROPION HCL) Take 1 tablet by mouth once a day  #30 x 2   Entered and Authorized by:   Beverley Fiedler MD   Signed by:   Beverley Fiedler MD on 07/08/2008   Method used:   Printed then faxed to ...        RxID:   [id number] OMEPRAZOLE 20 MG CPDR (OMEPRAZOLE) Take 1 capsule by mouth at bedtime  #30 x 3   Entered and Authorized by:   Beverley Fiedler MD   Signed by:   Beverley Fiedler MD on 07/08/2008   Method used:   Printed then faxed to ...         RxID:   [id number] LISINOPRIL-HYDROCHLOROTHIAZIDE 20-12.5 MG TABS (LISINOPRIL-HYDROCHLOROTHIAZIDE) Take 2 tablets  by mouth every morning  #60 x 4   Entered and Authorized by:   Beverley Fiedler MD   Signed by:   Beverley Fiedler MD on 07/08/2008   Method used:   Printed then faxed to ...         RxID:   [id number] POTASSIUM CHLORIDE CR 10 MEQ CR-TABS (POTASSIUM CHLORIDE) Take 2 tablets by mouth once daily  #60 x 2   Entered and Authorized by:   Beverley Fiedler MD   Signed by:   Beverley Fiedler MD on 07/08/2008   Method used:   Printed then faxed to ...         RxID:   [id number] METFORMIN HCL 1000 MG  TABS (METFORMIN HCL) Take 1 tablet  by mouth every 12 hours  #60 x 4   Entered and Authorized by:   Beverley Fiedler MD   Signed by:   Beverley Fiedler MD on 07/08/2008   Method used:   Printed then faxed to ...         RxID:   [id number]

## 2010-04-04 NOTE — Assessment & Plan Note (Signed)
Summary: change bupropion formulation  Prescriptions: WELLBUTRIN SR 150 MG XR12H-TAB (BUPROPION HCL) Take 1 tablet by mouth once a day  #30 x 2   Entered and Authorized by:   Beverley Fiedler MD   Signed by:   Beverley Fiedler MD on 03/15/2008   Method used:   Printed then faxed to ...         RxID:   [id number]  Fax to [facility] de[facility] pharmacy.

## 2010-04-04 NOTE — Letter (Signed)
Summary: Discharge Summary  Discharge Summary   Imported By: Arta Bruce 09/30/2008 10:15:30  _____________________________________________________________________  External Attachment:    Type:   Image     Comment:   External Document

## 2010-04-04 NOTE — Progress Notes (Signed)
Summary: Vascular Lab Call Report  Phone Note Call from Patient   Summary of Call: Vascular Lab called with report from LE Doppler Study and spoke with Dr Delrae Alfred to speak with Tech. Initial call taken by: Mikey College CMA,  April 29, 2008 10:48 AM  Follow-up for Phone Call        Occlusion of femoral artery with some flow in popliteal and PT, but very sluggish.  Called and spoke with Dr. Darrol Jump is to go to [facility] ED where Dr. Darrick Penna will see her. Called and notified Dois Davenport at vascular lab--transportation to be arranged.   Called ED and spoke with charge nurse with instructions to notify Dr. Darrick Penna when pt. gets there. This was done the morning of 04/29/08--just documenting today Follow-up by: Julieanne Manson MD,  April 30, 2008 6:22 PM

## 2010-04-04 NOTE — Letter (Signed)
Summary: REFERRAL/[facility] [place]/APPT DATE & TIME  REFERRAL/[facility] [place]/APPT DATE & TIME   Imported By: Arta Bruce 10/13/2008 11:26:59  _____________________________________________________________________  External Attachment:    Type:   Image     Comment:   External Document

## 2010-04-04 NOTE — Letter (Signed)
Summary: TEST ORDER/LE ARTERIAL DOPPLER  TEST ORDER/LE ARTERIAL DOPPLER   Imported By: Arta Bruce 04/28/2008 09:54:56  _____________________________________________________________________  External Attachment:    Type:   Image     Comment:   External Document

## 2010-04-04 NOTE — Letter (Signed)
Summary: VASCULAR & VEIN/OFICE VISIT  VASCULAR & VEIN/OFICE VISIT   Imported By: Arta Bruce 09/30/2008 10:18:09  _____________________________________________________________________  External Attachment:    Type:   Image     Comment:   External Document

## 2010-04-04 NOTE — Letter (Signed)
Summary: NOTES FROM HEART & VASCULAR  NOTES FROM HEART & VASCULAR   Imported By: Leodis Rains 05/17/2008 16:26:14  _____________________________________________________________________  External Attachment:    Type:   Image     Comment:   External Document

## 2010-04-04 NOTE — Letter (Signed)
Summary: EYE APPOINTMENT/DENNIS GARCIA,O.D  EYE APPOINTMENT/DENNIS GARCIA,O.D   Imported By: Arta Bruce 04/29/2008 14:11:59  _____________________________________________________________________  External Attachment:    Type:   Image     Comment:   External Document

## 2010-04-04 NOTE — Letter (Signed)
Summary: PVD/FEMORAL ENDARTERECTOMY  PVD/FEMORAL ENDARTERECTOMY   Imported By: Arta Bruce 07/05/2008 12:06:56  _____________________________________________________________________  External Attachment:    Type:   Image     Comment:   External Document

## 2010-04-04 NOTE — Assessment & Plan Note (Signed)
Summary: feet swollen/ problem walking//gk   Vital Signs:  Patient Profile:   45 Years Old Female Height:     67 inches Weight:      233 pounds BMI:     36.62 Temp:     97.7 degrees F Pulse rate:   88 / minute Pulse rhythm:   regular Resp:     20 per minute BP sitting:   140 / 94  (left arm) Cuff size:   large  Pt. in pain?   yes    Location:   left foot    Intensity:   7-8  Vitals Entered By: Vesta Mixer CMA (April 21, 2008 10:39 AM)              Is Patient Diabetic? Yes CBG Result 186  Does patient need assistance? Ambulation Normal     Chief Complaint:  left foot is really bothering started about 04/06/08 went to ED and started on various medications.  Pt not sure if she allergic to neurontin any longer as she is taking it from the ED.Marland Kitchen  History of Present Illness: Here for foot pain. Seen in [facility] ER on 04/12/2008 and diagnosed with diabetic neuropathy...records reviewed. Was started on Neurontin 300mg  two times a day. Here for f/u. Pt reports sudden onset of left Great and second toe pain. No h/o injury. Pain was electrical,tingling pain.Worse with toe movement. She had NO rash until last night when noted dark spots on left toes and lower leg.No painful but do itch. No fevers.No systemic ill symptoms. No other rashes on body   Just finished Augmentin 875mg  two times a day x 10 days. Finished yesterday. Pt got the abx from [facility] and unsure reason it was prescribed. She thought she had a "ingrown toenail".  Pt is a poorly controlled diabetic with her 12/09 HgAic=12.9%    Current Allergies: No known allergies   Past Medical History:    FIBROMYALGIA (ICD-729.1)...per pt report    GERD (ICD-530.81    GOITER, MULTINODULAR (ICD-241.1)    GALLSTONES (ICD-574.20)    HYPERTENSION (ICD-401.9)    DIABETES MELLITUS, TYPE II (ICD-250.00)    DEPRESSION (ICD-311)..was seeing Dr.Sena at [facility] 2007,and Dr.Barbara Smith 2006.Marland Kitchendepression is chronic for decades.  Also reports h/o PTSD per pt.    ANXIETY (ICD-300.00)      Past Surgical History:    Reviewed history from 02/23/2008 and no changes required:       Cholecystectomy 1990       Tubal ligation 2006      Physical Exam  General:     Well-developed,well-nourished,in no acute distress; alert,appropriate and cooperative throughout examination Lungs:     Normal respiratory effort, chest expands symmetrically. Lungs are clear to auscultation, no crackles or wheezes. Heart:     Normal rate and regular rhythm. S1 and S2 normal without gallop, murmur, click, rub or other extra sounds. Extremities:     Left LE: Has some pain with movement of 2nd toe PIP joint. Toes are warm with CR< 2 seconds.DP pulse 1+ but right foot with 2+. No erythema. Has dark palpable purpura starting at [facility] toe and extending to knee.  No lesions found above knee or on other leg. No other lesions on trunk or upper extremities.    Impression & Recommendations:  Problem # 1:  VASCULAR PURPURA (ICD-287.2) Isolated to left lower leg and developed < 24 hrs ago. Had neuropathic-like pain in left lower extremity x several weeks. ?etiology. ?vasculitis  Will refer her  to dermatologist today for evaluation. Orders: T-CBC w/Diff [id number] T-Comprehensive Metabolic Panel [phone]) T-Antinuclear Antib (ANA) [id number]) T-Sed Rate (Automated) (02725-36644) Dermatology Referral (Derma)   Problem # 2:  DIABETES MELLITUS, TYPE II (ICD-250.00) On-going issue with poor control. Pt to see MD next week. Her updated medication list for this problem includes:    Metformin Hcl 1000 Mg Tabs (Metformin hcl) .Marland Kitchen... Take 1 tablet by mouth every 12 hours    Lisinopril-hydrochlorothiazide 20-12.5 Mg Tabs (Lisinopril-hydrochlorothiazide) .Marland Kitchen... Take 1 tablet by mouth every morning    Lantus Solostar 100 Unit/ml Soln (Insulin glargine) ..... Inject 40 units subcutaneously at bedtime  Orders: Capillary Blood Glucose  (03474) Fingerstick (25956) T-CBC w/Diff ([id number]) T-Comprehensive Metabolic Panel ([id number]) T-Antinuclear Antib (ANA) ([id number]) T-Sed Rate (Automated) (35573-22025)   Complete Medication List: 1)  Metformin Hcl 1000 Mg Tabs (Metformin hcl) .... Take 1 tablet by mouth every 12 hours 2)  Potassium Chloride 20 Meq Pack (Potassium chloride) .... Take 1 by mouth qd 3)  Lisinopril-hydrochlorothiazide 20-12.5 Mg Tabs (Lisinopril-hydrochlorothiazide) .... Take 1 tablet by mouth every morning 4)  Trazodone Hcl 50 Mg Tabs (Trazodone hcl) .Marland Kitchen.. 1 by mouth at bedtime as needed insomnia 5)  Lantus Solostar 100 Unit/ml Soln (Insulin glargine) .... Inject 40 units subcutaneously at bedtime 6)  Celexa 40 Mg Tabs (Citalopram hydrobromide) .... Take 1 tablet by mouth once a day 7)  Omeprazole 20 Mg Cpdr (Omeprazole) .... Take 1 capsule by mouth at bedtime 8)  Glucometer and Supplies  .... Iddm check cbg two times a day and record 9)  Glucomer Strips  .... Iddm check cbgs two times a day and record 10)  Insulin Syringes  11)  Wellbutrin Sr 150 Mg Xr12h-tab (Bupropion hcl) .... Take 1 tablet by mouth once a day   Patient Instructions: 1)  See Dermatologist today. 2)  See Dr.Claryssa Sandner next week

## 2010-04-07 NOTE — Progress Notes (Signed)
Summary: VASCULAR & VEIN/VISIT  VASCULAR & VEIN/VISIT   Imported By: Arta Bruce 08/30/2008 11:54:04  _____________________________________________________________________  External Attachment:    Type:   Image     Comment:   External Document

## 2010-04-24 ENCOUNTER — Other Ambulatory Visit: Payer: Self-pay | Admitting: Internal Medicine

## 2010-04-24 DIAGNOSIS — E042 Nontoxic multinodular goiter: Secondary | ICD-10-CM

## 2010-04-26 ENCOUNTER — Ambulatory Visit
Admission: RE | Admit: 2010-04-26 | Discharge: 2010-04-26 | Disposition: A | Discharge status: Home / Self Care | Payer: PRIVATE HEALTH INSURANCE | Source: Ambulatory Visit | Attending: Internal Medicine | Admitting: Internal Medicine

## 2010-04-26 DIAGNOSIS — E042 Nontoxic multinodular goiter: Secondary | ICD-10-CM

## 2010-06-12 LAB — GLUCOSE, CAPILLARY
Glucose-Capillary: 158 mg/dL — ABNORMAL HIGH (ref 70–99)
Glucose-Capillary: 159 mg/dL — ABNORMAL HIGH (ref 70–99)
Glucose-Capillary: 177 mg/dL — ABNORMAL HIGH (ref 70–99)
Glucose-Capillary: 183 mg/dL — ABNORMAL HIGH (ref 70–99)
Glucose-Capillary: 234 mg/dL — ABNORMAL HIGH (ref 70–99)
Glucose-Capillary: 267 mg/dL — ABNORMAL HIGH (ref 70–99)
Glucose-Capillary: 268 mg/dL — ABNORMAL HIGH (ref 70–99)

## 2010-06-12 LAB — BASIC METABOLIC PANEL
BUN: 19 mg/dL (ref 6–23)
CO2: 25 mEq/L (ref 19–32)
Calcium: 8.7 mg/dL (ref 8.4–10.5)
Chloride: 98 mEq/L (ref 96–112)
Creatinine, Ser: 1.07 mg/dL (ref 0.4–1.2)
GFR calc Af Amer: >60 mL/min (ref >60)
GFR calc non Af Amer: 56 mL/min — ABNORMAL LOW (ref >60)
Glucose, Bld: 302 mg/dL — ABNORMAL HIGH (ref 70–99)
Potassium: 4.1 mEq/L (ref 3.5–5.1)
Sodium: 134 mEq/L — ABNORMAL LOW (ref 135–145)

## 2010-06-12 LAB — CULTURE, ROUTINE-ABSCESS

## 2010-06-12 LAB — DIFFERENTIAL
Basophils Absolute: 0 10*3/uL (ref 0.0–0.1)
Basophils Relative: 1 % (ref 0–1)
Eosinophils Absolute: 0.3 10*3/uL (ref 0.0–0.7)
Eosinophils Relative: 3 % (ref 0–5)
Lymphocytes Relative: 38 % (ref 12–46)
Lymphs Abs: 3.9 10*3/uL (ref 0.7–4.0)
Monocytes Absolute: 0.8 10*3/uL (ref 0.1–1.0)
Monocytes Relative: 8 % (ref 3–12)
Neutro Abs: 5.3 10*3/uL (ref 1.7–7.7)
Neutrophils Relative %: 51 % (ref 43–77)

## 2010-06-12 LAB — CBC
HCT: 38.2 % (ref 36.0–46.0)
Hemoglobin: 13.2 g/dL (ref 12.0–15.0)
MCHC: 34.7 g/dL (ref 30.0–36.0)
MCV: 91.8 fL (ref 78.0–100.0)
Platelets: 359 10*3/uL (ref 150–400)
RBC: 4.16 MIL/uL (ref 3.87–5.11)
RDW: 13.6 % (ref 11.5–15.5)
WBC: 10.4 10*3/uL (ref 4.0–10.5)

## 2010-06-12 LAB — TISSUE CULTURE

## 2010-06-12 LAB — ANAEROBIC CULTURE

## 2010-06-13 LAB — BASIC METABOLIC PANEL
BUN: 12 mg/dL (ref 6–23)
CO2: 32 mEq/L (ref 19–32)
Calcium: 8.4 mg/dL (ref 8.4–10.5)
Chloride: 99 mEq/L (ref 96–112)
Creatinine, Ser: 0.84 mg/dL (ref 0.4–1.2)
GFR calc Af Amer: >60 mL/min (ref >60)
GFR calc non Af Amer: >60 mL/min (ref >60)
Glucose, Bld: 260 mg/dL — ABNORMAL HIGH (ref 70–99)
Potassium: 4.2 mEq/L (ref 3.5–5.1)
Sodium: 136 mEq/L (ref 135–145)

## 2010-06-13 LAB — CBC
HCT: 36.2 % (ref 36.0–46.0)
Hemoglobin: 12.4 g/dL (ref 12.0–15.0)
MCHC: 34.3 g/dL (ref 30.0–36.0)
MCV: 92.6 fL (ref 78.0–100.0)
Platelets: 332 10*3/uL (ref 150–400)
RBC: 3.91 MIL/uL (ref 3.87–5.11)
RDW: 13.8 % (ref 11.5–15.5)
WBC: 10.7 10*3/uL — ABNORMAL HIGH (ref 4.0–10.5)

## 2010-06-13 LAB — GLUCOSE, CAPILLARY
Glucose-Capillary: 120 mg/dL — ABNORMAL HIGH (ref 70–99)
Glucose-Capillary: 147 mg/dL — ABNORMAL HIGH (ref 70–99)
Glucose-Capillary: 164 mg/dL — ABNORMAL HIGH (ref 70–99)
Glucose-Capillary: 166 mg/dL — ABNORMAL HIGH (ref 70–99)
Glucose-Capillary: 199 mg/dL — ABNORMAL HIGH (ref 70–99)
Glucose-Capillary: 241 mg/dL — ABNORMAL HIGH (ref 70–99)
Glucose-Capillary: 243 mg/dL — ABNORMAL HIGH (ref 70–99)
Glucose-Capillary: 253 mg/dL — ABNORMAL HIGH (ref 70–99)
Glucose-Capillary: 268 mg/dL — ABNORMAL HIGH (ref 70–99)
Glucose-Capillary: 298 mg/dL — ABNORMAL HIGH (ref 70–99)

## 2010-06-13 LAB — POCT I-STAT 4, (NA,K, GLUC, HGB,HCT)
Glucose, Bld: 322 mg/dL — ABNORMAL HIGH (ref 70–99)
HCT: 45 % (ref 36.0–46.0)
Hemoglobin: 15.3 g/dL — ABNORMAL HIGH (ref 12.0–15.0)
Potassium: 4.3 mEq/L (ref 3.5–5.1)
Sodium: 134 mEq/L — ABNORMAL LOW (ref 135–145)

## 2010-06-15 LAB — GLUCOSE, CAPILLARY
Glucose-Capillary: 103 mg/dL — ABNORMAL HIGH (ref 70–99)
Glucose-Capillary: 109 mg/dL — ABNORMAL HIGH (ref 70–99)
Glucose-Capillary: 114 mg/dL — ABNORMAL HIGH (ref 70–99)
Glucose-Capillary: 122 mg/dL — ABNORMAL HIGH (ref 70–99)
Glucose-Capillary: 126 mg/dL — ABNORMAL HIGH (ref 70–99)
Glucose-Capillary: 126 mg/dL — ABNORMAL HIGH (ref 70–99)
Glucose-Capillary: 132 mg/dL — ABNORMAL HIGH (ref 70–99)
Glucose-Capillary: 135 mg/dL — ABNORMAL HIGH (ref 70–99)
Glucose-Capillary: 136 mg/dL — ABNORMAL HIGH (ref 70–99)
Glucose-Capillary: 140 mg/dL — ABNORMAL HIGH (ref 70–99)
Glucose-Capillary: 143 mg/dL — ABNORMAL HIGH (ref 70–99)
Glucose-Capillary: 150 mg/dL — ABNORMAL HIGH (ref 70–99)
Glucose-Capillary: 159 mg/dL — ABNORMAL HIGH (ref 70–99)
Glucose-Capillary: 162 mg/dL — ABNORMAL HIGH (ref 70–99)
Glucose-Capillary: 167 mg/dL — ABNORMAL HIGH (ref 70–99)
Glucose-Capillary: 171 mg/dL — ABNORMAL HIGH (ref 70–99)
Glucose-Capillary: 173 mg/dL — ABNORMAL HIGH (ref 70–99)
Glucose-Capillary: 176 mg/dL — ABNORMAL HIGH (ref 70–99)
Glucose-Capillary: 181 mg/dL — ABNORMAL HIGH (ref 70–99)
Glucose-Capillary: 181 mg/dL — ABNORMAL HIGH (ref 70–99)
Glucose-Capillary: 182 mg/dL — ABNORMAL HIGH (ref 70–99)
Glucose-Capillary: 190 mg/dL — ABNORMAL HIGH (ref 70–99)
Glucose-Capillary: 192 mg/dL — ABNORMAL HIGH (ref 70–99)
Glucose-Capillary: 217 mg/dL — ABNORMAL HIGH (ref 70–99)
Glucose-Capillary: 247 mg/dL — ABNORMAL HIGH (ref 70–99)
Glucose-Capillary: 247 mg/dL — ABNORMAL HIGH (ref 70–99)
Glucose-Capillary: 62 mg/dL — ABNORMAL LOW (ref 70–99)
Glucose-Capillary: 63 mg/dL — ABNORMAL LOW (ref 70–99)
Glucose-Capillary: 69 mg/dL — ABNORMAL LOW (ref 70–99)
Glucose-Capillary: 74 mg/dL (ref 70–99)
Glucose-Capillary: 75 mg/dL (ref 70–99)
Glucose-Capillary: 77 mg/dL (ref 70–99)
Glucose-Capillary: 80 mg/dL (ref 70–99)
Glucose-Capillary: 84 mg/dL (ref 70–99)
Glucose-Capillary: 86 mg/dL (ref 70–99)
Glucose-Capillary: 97 mg/dL (ref 70–99)
Glucose-Capillary: 101 mg/dL — ABNORMAL HIGH (ref 70–99)
Glucose-Capillary: 108 mg/dL — ABNORMAL HIGH (ref 70–99)
Glucose-Capillary: 109 mg/dL — ABNORMAL HIGH (ref 70–99)
Glucose-Capillary: 116 mg/dL — ABNORMAL HIGH (ref 70–99)
Glucose-Capillary: 117 mg/dL — ABNORMAL HIGH (ref 70–99)
Glucose-Capillary: 126 mg/dL — ABNORMAL HIGH (ref 70–99)
Glucose-Capillary: 127 mg/dL — ABNORMAL HIGH (ref 70–99)
Glucose-Capillary: 131 mg/dL — ABNORMAL HIGH (ref 70–99)
Glucose-Capillary: 133 mg/dL — ABNORMAL HIGH (ref 70–99)
Glucose-Capillary: 135 mg/dL — ABNORMAL HIGH (ref 70–99)
Glucose-Capillary: 137 mg/dL — ABNORMAL HIGH (ref 70–99)
Glucose-Capillary: 138 mg/dL — ABNORMAL HIGH (ref 70–99)
Glucose-Capillary: 140 mg/dL — ABNORMAL HIGH (ref 70–99)
Glucose-Capillary: 140 mg/dL — ABNORMAL HIGH (ref 70–99)
Glucose-Capillary: 143 mg/dL — ABNORMAL HIGH (ref 70–99)
Glucose-Capillary: 144 mg/dL — ABNORMAL HIGH (ref 70–99)
Glucose-Capillary: 150 mg/dL — ABNORMAL HIGH (ref 70–99)
Glucose-Capillary: 150 mg/dL — ABNORMAL HIGH (ref 70–99)
Glucose-Capillary: 173 mg/dL — ABNORMAL HIGH (ref 70–99)
Glucose-Capillary: 182 mg/dL — ABNORMAL HIGH (ref 70–99)
Glucose-Capillary: 194 mg/dL — ABNORMAL HIGH (ref 70–99)
Glucose-Capillary: 198 mg/dL — ABNORMAL HIGH (ref 70–99)
Glucose-Capillary: 234 mg/dL — ABNORMAL HIGH (ref 70–99)
Glucose-Capillary: 254 mg/dL — ABNORMAL HIGH (ref 70–99)
Glucose-Capillary: 256 mg/dL — ABNORMAL HIGH (ref 70–99)
Glucose-Capillary: 292 mg/dL — ABNORMAL HIGH (ref 70–99)
Glucose-Capillary: 297 mg/dL — ABNORMAL HIGH (ref 70–99)
Glucose-Capillary: 91 mg/dL (ref 70–99)

## 2010-06-15 LAB — PROTEIN ELECTROPH W RFLX QUANT IMMUNOGLOBULINS
Albumin ELP: 42.8 % — ABNORMAL LOW (ref 55.8–66.1)
Alpha-1-Globulin: 10.6 % — ABNORMAL HIGH (ref 2.9–4.9)
Alpha-2-Globulin: 15.4 % — ABNORMAL HIGH (ref 7.1–11.8)
Beta 2: 6.2 % (ref 3.2–6.5)
Beta Globulin: 7.2 % (ref 4.7–7.2)
Gamma Globulin: 17.8 % (ref 11.1–18.8)
M-Spike, %: NOT DETECTED g/dL
Total Protein ELP: 7.5 g/dL (ref 6.0–8.3)

## 2010-06-15 LAB — BASIC METABOLIC PANEL
BUN: 26 mg/dL — ABNORMAL HIGH (ref 6–23)
BUN: 15 mg/dL (ref 6–23)
BUN: 16 mg/dL (ref 6–23)
CO2: 27 mEq/L (ref 19–32)
CO2: 25 mEq/L (ref 19–32)
CO2: 27 mEq/L (ref 19–32)
Calcium: 9 mg/dL (ref 8.4–10.5)
Calcium: 8.5 mg/dL (ref 8.4–10.5)
Calcium: 9.1 mg/dL (ref 8.4–10.5)
Chloride: 100 mEq/L (ref 96–112)
Chloride: 100 mEq/L (ref 96–112)
Chloride: 102 mEq/L (ref 96–112)
Creatinine, Ser: 0.99 mg/dL (ref 0.4–1.2)
Creatinine, Ser: 0.62 mg/dL (ref 0.4–1.2)
Creatinine, Ser: 0.64 mg/dL (ref 0.4–1.2)
GFR calc Af Amer: >60 mL/min (ref >60)
GFR calc Af Amer: >60 mL/min (ref >60)
GFR calc Af Amer: >60 mL/min (ref >60)
GFR calc non Af Amer: >60 mL/min (ref >60)
GFR calc non Af Amer: >60 mL/min (ref >60)
GFR calc non Af Amer: >60 mL/min (ref >60)
Glucose, Bld: 114 mg/dL — ABNORMAL HIGH (ref 70–99)
Glucose, Bld: 119 mg/dL — ABNORMAL HIGH (ref 70–99)
Glucose, Bld: 146 mg/dL — ABNORMAL HIGH (ref 70–99)
Potassium: 4.6 mEq/L (ref 3.5–5.1)
Potassium: 4.2 mEq/L (ref 3.5–5.1)
Potassium: 4.5 mEq/L (ref 3.5–5.1)
Sodium: 134 mEq/L — ABNORMAL LOW (ref 135–145)
Sodium: 134 mEq/L — ABNORMAL LOW (ref 135–145)
Sodium: 136 mEq/L (ref 135–145)

## 2010-06-15 LAB — CULTURE, BLOOD (ROUTINE X 2)
Culture: NO GROWTH
Culture: NO GROWTH

## 2010-06-15 LAB — URINALYSIS, ROUTINE W REFLEX MICROSCOPIC
Bilirubin Urine: NEGATIVE
Glucose, UA: NEGATIVE mg/dL
Ketones, ur: NEGATIVE mg/dL
Nitrite: NEGATIVE
Protein, ur: NEGATIVE mg/dL
Specific Gravity, Urine: 1.019 (ref 1.005–1.030)
Urobilinogen, UA: 0.2 mg/dL (ref 0.0–1.0)
pH: 5 (ref 5.0–8.0)

## 2010-06-15 LAB — POCT I-STAT, CHEM 8
BUN: 22 mg/dL (ref 6–23)
Calcium, Ion: 1.11 mmol/L — ABNORMAL LOW (ref 1.12–1.32)
Chloride: 103 mEq/L (ref 96–112)
Creatinine, Ser: 0.8 mg/dL (ref 0.4–1.2)
Glucose, Bld: 176 mg/dL — ABNORMAL HIGH (ref 70–99)
HCT: 37 % (ref 36.0–46.0)
Hemoglobin: 12.6 g/dL (ref 12.0–15.0)
Potassium: 4.2 mEq/L (ref 3.5–5.1)
Sodium: 138 mEq/L (ref 135–145)
TCO2: 27 mmol/L (ref 0–100)

## 2010-06-15 LAB — CBC
HCT: 32 % — ABNORMAL LOW (ref 36.0–46.0)
HCT: 33.3 % — ABNORMAL LOW (ref 36.0–46.0)
HCT: 35 % — ABNORMAL LOW (ref 36.0–46.0)
HCT: 35.6 % — ABNORMAL LOW (ref 36.0–46.0)
Hemoglobin: 11.1 g/dL — ABNORMAL LOW (ref 12.0–15.0)
Hemoglobin: 11.4 g/dL — ABNORMAL LOW (ref 12.0–15.0)
Hemoglobin: 12.1 g/dL (ref 12.0–15.0)
Hemoglobin: 12.4 g/dL (ref 12.0–15.0)
MCHC: 34.7 g/dL (ref 30.0–36.0)
MCHC: 34.4 g/dL (ref 30.0–36.0)
MCHC: 34.5 g/dL (ref 30.0–36.0)
MCHC: 34.7 g/dL (ref 30.0–36.0)
MCV: 91.9 fL (ref 78.0–100.0)
MCV: 91.9 fL (ref 78.0–100.0)
MCV: 92.3 fL (ref 78.0–100.0)
MCV: 94.1 fL (ref 78.0–100.0)
Platelets: 515 10*3/uL — ABNORMAL HIGH (ref 150–400)
Platelets: 303 10*3/uL (ref 150–400)
Platelets: 462 10*3/uL — ABNORMAL HIGH (ref 150–400)
Platelets: 468 10*3/uL — ABNORMAL HIGH (ref 150–400)
RBC: 3.48 MIL/uL — ABNORMAL LOW (ref 3.87–5.11)
RBC: 3.54 MIL/uL — ABNORMAL LOW (ref 3.87–5.11)
RBC: 3.79 MIL/uL — ABNORMAL LOW (ref 3.87–5.11)
RBC: 3.88 MIL/uL (ref 3.87–5.11)
RDW: 12.8 % (ref 11.5–15.5)
RDW: 13.1 % (ref 11.5–15.5)
RDW: 13.1 % (ref 11.5–15.5)
RDW: 13.3 % (ref 11.5–15.5)
WBC: 10.9 10*3/uL — ABNORMAL HIGH (ref 4.0–10.5)
WBC: 10.8 10*3/uL — ABNORMAL HIGH (ref 4.0–10.5)
WBC: 11.8 10*3/uL — ABNORMAL HIGH (ref 4.0–10.5)
WBC: 13.8 10*3/uL — ABNORMAL HIGH (ref 4.0–10.5)

## 2010-06-15 LAB — HIV ANTIBODY (ROUTINE TESTING W REFLEX)
HIV: REACTIVE — AB
HIV: REACTIVE — AB
HIV: REACTIVE — AB

## 2010-06-15 LAB — DIFFERENTIAL
Basophils Absolute: 0.1 10*3/uL (ref 0.0–0.1)
Basophils Absolute: 0.1 10*3/uL (ref 0.0–0.1)
Basophils Relative: 0 % (ref 0–1)
Basophils Relative: 1 % (ref 0–1)
Eosinophils Absolute: 0.2 10*3/uL (ref 0.0–0.7)
Eosinophils Absolute: 0.2 10*3/uL (ref 0.0–0.7)
Eosinophils Relative: 2 % (ref 0–5)
Eosinophils Relative: 2 % (ref 0–5)
Lymphocytes Relative: 27 % (ref 12–46)
Lymphocytes Relative: 28 % (ref 12–46)
Lymphs Abs: 3 10*3/uL (ref 0.7–4.0)
Lymphs Abs: 3.8 10*3/uL (ref 0.7–4.0)
Monocytes Absolute: 0.6 10*3/uL (ref 0.1–1.0)
Monocytes Absolute: 0.6 10*3/uL (ref 0.1–1.0)
Monocytes Relative: 4 % (ref 3–12)
Monocytes Relative: 5 % (ref 3–12)
Neutro Abs: 6.9 10*3/uL (ref 1.7–7.7)
Neutro Abs: 9.2 10*3/uL — ABNORMAL HIGH (ref 1.7–7.7)
Neutrophils Relative %: 64 % (ref 43–77)
Neutrophils Relative %: 66 % (ref 43–77)

## 2010-06-15 LAB — URINE MICROSCOPIC-ADD ON

## 2010-06-15 LAB — HEPATITIS B CORE ANTIBODY, IGM: Hep B C IgM: NEGATIVE

## 2010-06-15 LAB — T3: T3, Total: 76.7 ng/dl — ABNORMAL LOW (ref 80.0–204.0)

## 2010-06-15 LAB — HIV-1 RNA, QUALITATIVE, TMA
HIV-1 RNA, Qualitative, TMA: NOT DETECTED
HIV-1 RNA, Qualitative, TMA: NOT DETECTED

## 2010-06-15 LAB — HIV 1/2 CONFIRMATION
HIV-1 antibody: NEGATIVE
HIV-1 antibody: NEGATIVE
HIV-1 antibody: NEGATIVE
HIV-2 Ab: NEGATIVE
HIV-2 Ab: NEGATIVE
HIV-2 Ab: NEGATIVE

## 2010-06-15 LAB — HEMOGLOBIN A1C
Hgb A1c MFr Bld: 11 % — ABNORMAL HIGH (ref 4.6–6.1)
Mean Plasma Glucose: 269 mg/dL

## 2010-06-15 LAB — HEPATITIS C ANTIBODY: HCV Ab: NEGATIVE

## 2010-06-15 LAB — ANTI-NEUTROPHIL ANTIBODY

## 2010-06-15 LAB — C4 COMPLEMENT: Complement C4, Body Fluid: 24 mg/dL (ref 16–47)

## 2010-06-15 LAB — HIV-1 RNA QUANT-NO REFLEX-BLD
HIV 1 RNA Quant: <48 copies/mL (ref <48)
HIV-1 RNA Quant, Log: <1.68 {Log} (ref <1.68)

## 2010-06-15 LAB — CRYOGLOBULIN

## 2010-06-15 LAB — T-HELPER CELLS (CD4) COUNT (NOT AT ARMC)
CD4 % Helper T Cell: 53 % (ref 33–55)
CD4 T Cell Abs: 780 uL (ref 400–2700)

## 2010-06-15 LAB — C3 COMPLEMENT: C3 Complement: 238 mg/dL — ABNORMAL HIGH (ref 88–201)

## 2010-06-15 LAB — T4: T4, Total: 9.7 ug/dL (ref 5.0–12.5)

## 2010-06-20 LAB — CBC
HCT: 45.8 % (ref 36.0–46.0)
HCT: 37.1 % (ref 36.0–46.0)
HCT: 44.1 % (ref 36.0–46.0)
HCT: 49.2 % — ABNORMAL HIGH (ref 36.0–46.0)
Hemoglobin: 15.4 g/dL — ABNORMAL HIGH (ref 12.0–15.0)
Hemoglobin: 12.8 g/dL (ref 12.0–15.0)
Hemoglobin: 15.3 g/dL — ABNORMAL HIGH (ref 12.0–15.0)
Hemoglobin: 16.6 g/dL — ABNORMAL HIGH (ref 12.0–15.0)
MCHC: 33.5 g/dL (ref 30.0–36.0)
MCHC: 33.8 g/dL (ref 30.0–36.0)
MCHC: 34.4 g/dL (ref 30.0–36.0)
MCHC: 34.6 g/dL (ref 30.0–36.0)
MCV: 93.1 fL (ref 78.0–100.0)
MCV: 92.3 fL (ref 78.0–100.0)
MCV: 92.7 fL (ref 78.0–100.0)
MCV: 93.6 fL (ref 78.0–100.0)
Platelets: 335 10*3/uL (ref 150–400)
Platelets: 260 10*3/uL (ref 150–400)
Platelets: 305 10*3/uL (ref 150–400)
Platelets: 342 10*3/uL (ref 150–400)
RBC: 4.92 MIL/uL (ref 3.87–5.11)
RBC: 4.02 MIL/uL (ref 3.87–5.11)
RBC: 4.76 MIL/uL (ref 3.87–5.11)
RBC: 5.25 MIL/uL — ABNORMAL HIGH (ref 3.87–5.11)
RDW: 12.8 % (ref 11.5–15.5)
RDW: 13.3 % (ref 11.5–15.5)
RDW: 13.4 % (ref 11.5–15.5)
RDW: 13.5 % (ref 11.5–15.5)
WBC: 8.9 10*3/uL (ref 4.0–10.5)
WBC: 10.5 10*3/uL (ref 4.0–10.5)
WBC: 14.7 10*3/uL — ABNORMAL HIGH (ref 4.0–10.5)
WBC: 16 10*3/uL — ABNORMAL HIGH (ref 4.0–10.5)

## 2010-06-20 LAB — BASIC METABOLIC PANEL
BUN: 18 mg/dL (ref 6–23)
BUN: 21 mg/dL (ref 6–23)
BUN: 21 mg/dL (ref 6–23)
CO2: 24 mEq/L (ref 19–32)
CO2: 26 mEq/L (ref 19–32)
CO2: 27 mEq/L (ref 19–32)
Calcium: 7.7 mg/dL — ABNORMAL LOW (ref 8.4–10.5)
Calcium: 9 mg/dL (ref 8.4–10.5)
Calcium: 9.4 mg/dL (ref 8.4–10.5)
Chloride: 103 mEq/L (ref 96–112)
Chloride: 96 mEq/L (ref 96–112)
Chloride: 98 mEq/L (ref 96–112)
Creatinine, Ser: 0.65 mg/dL (ref 0.4–1.2)
Creatinine, Ser: 0.78 mg/dL (ref 0.4–1.2)
Creatinine, Ser: 0.79 mg/dL (ref 0.4–1.2)
GFR calc Af Amer: >60 mL/min (ref >60)
GFR calc Af Amer: >60 mL/min (ref >60)
GFR calc Af Amer: >60 mL/min (ref >60)
GFR calc non Af Amer: >60 mL/min (ref >60)
GFR calc non Af Amer: >60 mL/min (ref >60)
GFR calc non Af Amer: >60 mL/min (ref >60)
Glucose, Bld: 233 mg/dL — ABNORMAL HIGH (ref 70–99)
Glucose, Bld: 248 mg/dL — ABNORMAL HIGH (ref 70–99)
Glucose, Bld: 281 mg/dL — ABNORMAL HIGH (ref 70–99)
Potassium: 3.7 mEq/L (ref 3.5–5.1)
Potassium: 3.9 mEq/L (ref 3.5–5.1)
Potassium: 4.1 mEq/L (ref 3.5–5.1)
Sodium: 131 mEq/L — ABNORMAL LOW (ref 135–145)
Sodium: 134 mEq/L — ABNORMAL LOW (ref 135–145)
Sodium: 135 mEq/L (ref 135–145)

## 2010-06-20 LAB — GLUCOSE, CAPILLARY
Glucose-Capillary: 280 mg/dL — ABNORMAL HIGH (ref 70–99)
Glucose-Capillary: 142 mg/dL — ABNORMAL HIGH (ref 70–99)
Glucose-Capillary: 146 mg/dL — ABNORMAL HIGH (ref 70–99)
Glucose-Capillary: 152 mg/dL — ABNORMAL HIGH (ref 70–99)
Glucose-Capillary: 157 mg/dL — ABNORMAL HIGH (ref 70–99)
Glucose-Capillary: 185 mg/dL — ABNORMAL HIGH (ref 70–99)
Glucose-Capillary: 197 mg/dL — ABNORMAL HIGH (ref 70–99)
Glucose-Capillary: 199 mg/dL — ABNORMAL HIGH (ref 70–99)
Glucose-Capillary: 202 mg/dL — ABNORMAL HIGH (ref 70–99)
Glucose-Capillary: 213 mg/dL — ABNORMAL HIGH (ref 70–99)
Glucose-Capillary: 226 mg/dL — ABNORMAL HIGH (ref 70–99)
Glucose-Capillary: 235 mg/dL — ABNORMAL HIGH (ref 70–99)
Glucose-Capillary: 242 mg/dL — ABNORMAL HIGH (ref 70–99)
Glucose-Capillary: 252 mg/dL — ABNORMAL HIGH (ref 70–99)
Glucose-Capillary: 265 mg/dL — ABNORMAL HIGH (ref 70–99)
Glucose-Capillary: 298 mg/dL — ABNORMAL HIGH (ref 70–99)

## 2010-06-20 LAB — DIFFERENTIAL
Basophils Absolute: 0.2 10*3/uL — ABNORMAL HIGH (ref 0.0–0.1)
Basophils Absolute: 0.1 10*3/uL (ref 0.0–0.1)
Basophils Absolute: 0.2 10*3/uL — ABNORMAL HIGH (ref 0.0–0.1)
Basophils Relative: 3 % — ABNORMAL HIGH (ref 0–1)
Basophils Relative: 1 % (ref 0–1)
Basophils Relative: 1 % (ref 0–1)
Eosinophils Absolute: 0.2 10*3/uL (ref 0.0–0.7)
Eosinophils Absolute: 0.2 10*3/uL (ref 0.0–0.7)
Eosinophils Absolute: 0.3 10*3/uL (ref 0.0–0.7)
Eosinophils Relative: 2 % (ref 0–5)
Eosinophils Relative: 2 % (ref 0–5)
Eosinophils Relative: 2 % (ref 0–5)
Lymphocytes Relative: 38 % (ref 12–46)
Lymphocytes Relative: 41 % (ref 12–46)
Lymphocytes Relative: 43 % (ref 12–46)
Lymphs Abs: 3.4 10*3/uL (ref 0.7–4.0)
Lymphs Abs: 4.3 10*3/uL — ABNORMAL HIGH (ref 0.7–4.0)
Lymphs Abs: 6.9 10*3/uL — ABNORMAL HIGH (ref 0.7–4.0)
Monocytes Absolute: 0.6 10*3/uL (ref 0.1–1.0)
Monocytes Absolute: 0.8 10*3/uL (ref 0.1–1.0)
Monocytes Absolute: 0.8 10*3/uL (ref 0.1–1.0)
Monocytes Relative: 6 % (ref 3–12)
Monocytes Relative: 5 % (ref 3–12)
Monocytes Relative: 8 % (ref 3–12)
Neutro Abs: 4.5 10*3/uL (ref 1.7–7.7)
Neutro Abs: 5 10*3/uL (ref 1.7–7.7)
Neutro Abs: 7.8 10*3/uL — ABNORMAL HIGH (ref 1.7–7.7)
Neutrophils Relative %: 51 % (ref 43–77)
Neutrophils Relative %: 48 % (ref 43–77)
Neutrophils Relative %: 49 % (ref 43–77)

## 2010-06-20 LAB — POCT I-STAT, CHEM 8
BUN: 7 mg/dL (ref 6–23)
BUN: 15 mg/dL (ref 6–23)
Calcium, Ion: 1.07 mmol/L — ABNORMAL LOW (ref 1.12–1.32)
Calcium, Ion: 1.05 mmol/L — ABNORMAL LOW (ref 1.12–1.32)
Chloride: 101 mEq/L (ref 96–112)
Chloride: 103 mEq/L (ref 96–112)
Creatinine, Ser: 0.6 mg/dL (ref 0.4–1.2)
Creatinine, Ser: 0.6 mg/dL (ref 0.4–1.2)
Glucose, Bld: 228 mg/dL — ABNORMAL HIGH (ref 70–99)
Glucose, Bld: 227 mg/dL — ABNORMAL HIGH (ref 70–99)
HCT: 41 % (ref 36.0–46.0)
HCT: 50 % — ABNORMAL HIGH (ref 36.0–46.0)
Hemoglobin: 13.9 g/dL (ref 12.0–15.0)
Hemoglobin: 17 g/dL — ABNORMAL HIGH (ref 12.0–15.0)
Potassium: 3.9 mEq/L (ref 3.5–5.1)
Potassium: 3.9 mEq/L (ref 3.5–5.1)
Sodium: 136 mEq/L (ref 135–145)
Sodium: 137 mEq/L (ref 135–145)
TCO2: 24 mmol/L (ref 0–100)
TCO2: 23 mmol/L (ref 0–100)

## 2010-06-20 LAB — HEMOGLOBIN A1C
Hgb A1c MFr Bld: 11.4 % — ABNORMAL HIGH (ref 4.6–6.1)
Mean Plasma Glucose: 280 mg/dL

## 2010-06-20 LAB — TYPE AND SCREEN
ABO/RH(D): O POS
Antibody Screen: NEGATIVE

## 2010-06-20 LAB — POCT I-STAT GLUCOSE
Glucose, Bld: 158 mg/dL — ABNORMAL HIGH (ref 70–99)
Operator id: 133981

## 2010-06-20 LAB — PROTIME-INR
INR: 0.9 (ref 0.00–1.49)
Prothrombin Time: 12.6 seconds (ref 11.6–15.2)

## 2010-06-20 LAB — PATHOLOGIST SMEAR REVIEW

## 2010-06-20 LAB — ABO/RH: ABO/RH(D): O POS

## 2010-07-18 NOTE — Assessment & Plan Note (Signed)
OFFICE VISIT   Kelly Barber, Kelly Barber  DOB:  10/17/1965                                       06/23/2008  CHART#:[id number]   The patient returns for follow-up today.  She previously underwent left  femoral to above-knee popliteal bypass and left femoral endarterectomy  in February 2010.  Bypass was done with vein.  She presents today for  further wound care.  The above knee popliteal incision has broken down  some and this was debrided today.  There is a small amount of fibrinous  exudate but no purulent  material and no surrounding erythema.  The  wound is fairly superficial in depth.  Also the wounds around her ankle  and lateral left leg were debrided today as well.  There are several  areas of dark dry eschar.  But there was good bleeding granulation  tissue beneath these.  She still has dry gangrene of the left first  through third toes.  Currently we are managing these conservatively with  observation.  She is considering whether or not she would like to have  these toes amputated.   Overall the patient is doing well.  Her wounds continue to heal.  She  will continue to put collagenase on the lower extremity wounds around  the left lateral ankle area to soften the eschar there.  She will follow  up with me in two weeks' time.  She is now off her Fentanyl patches.  I  did give her a prescription for Percocet today #40 dispensed.   Janetta Hora. Fields, MD  Electronically Signed   CEF/MEDQ  D:  06/23/2008  T:  06/24/2008  Job:  [id number]

## 2010-07-18 NOTE — Assessment & Plan Note (Signed)
OFFICE VISIT   SEYNABOU, FULTS  DOB:  [birth year]                                       09/08/2008  CHART#:[id number]   The patient's left foot continues to heal well.  She was last seen on  June 23rd.  On exam today the first toe amputation site is now  approximately 2 cm x 2 cm with good pink granulation base and starting  to epithelialize from the edges.  The remainder of her wounds are all  healed at this point.  Overall, she is doing well.  She has some mild  depression still and she is going to go to counseling regarding this.  She will follow up with me in 1 month's time.  She will need a protocol  scan at that time of her bypass graft.  She was also given a  prescription today for diabetic protective shoes.   Janetta Hora. Fields, MD  Electronically Signed   CEF/MEDQ  D:  09/08/2008  T:  09/09/2008  Job:  [id number]

## 2010-07-18 NOTE — Discharge Summary (Signed)
[name], Kelly Barber               ACCOUNT NO.:  [id number]   MEDICAL RECORD NO.:  [id number]          PATIENT TYPE:  INP   LOCATION:  2029                         FACILITY:  [facility]   PHYSICIAN:  Janetta Hora. Fields, MD  DATE OF BIRTH:  Feb 25, 1966   DATE OF ADMISSION:  07/12/2008  DATE OF DISCHARGE:  07/14/2008                               DISCHARGE SUMMARY   FINAL DISCHARGE DIAGNOSES:  1. Ischemic left lower extremity with nonhealing foot ulcers.  2. Diabetes mellitus.  3. Peripheral vascular disease.   PROCEDURES PERFORMED:  Amputation of first, second, and third toes of  left foot and debridement of left leg by Dr. Darrick Penna on Jul 12, 2008.   COMPLICATIONS:  None.   CONDITION AT DISCHARGE:  Stable and improving.   DISCHARGE MEDICATIONS:  She is instructed to resume all previously  scheduled medications consisting of:  1. Omeprazole 20 mg p.o. at bedtime.  2. Wellbutrin SR 150 mg daily.  3. Prednisone 20 mg p.o. t.i.d.  4. NovoLog 100 units sliding scale with each meal.  5. Santyl applied daily.  6. Hydrogel dressing gel daily.  7. Percocet 5/325 one to two p.o. q.4 h. p.r.n. pain.  She is given 30      tablets.  8. Levemir Flexpen 30 units b.i.d.  9. Potassium chloride 10 mEq p.o. daily.  10.Metformin 1000 mg 1 each every 12 hours.  11.Lisinopril and hydrochlorothiazide 20/12.5 two each q.a.m.   DISPOSITION:  She is being discharged home following careful  instructions regarding her activity and wound care.  She is scheduled to  Dr. Darrick Penna in 2-3 weeks for followup, the office will arrange the visit.   BRIEF IDENTIFYING STATEMENT:  For complete details, please refer the  typed history and physical.  Briefly, this very pleasant 45 year old  woman has been followed by Dr. Darrick Penna for the last several months for  peripheral vascular disease.  She has developed gangrene of the first,  second, and third toes and continues to have areas of necrotic skin on  her left leg.  This  is following revascularization.  Dr. Darrick Penna felt  that she should undergo amputation of her toes and debridement of her  extremity wounds.  She was informed of the risks and benefits of the  procedure and after careful consideration elected to proceed with  surgery.   HOSPITAL COURSE:  Preoperative workup was completed as an outpatient.  She was brought in through same-day surgery and underwent the  aforementioned amputation with wound debridement.  She tolerated the  procedure and was returned to the Post Anesthesia Care Unit extubated.  Following stabilization she was transferred to a bed on a surgical  convalescent floor.  She was observed on the floor for the next 2 days.  Her wounds were healing well.  Her  pain was controlled.  On Jul 14, 2008, she was evaluated and found to be  stable, and she was desirous of discharge.  Home health nursing and home  health physical therapy will arrange for wound care and assistance with  her activity and rehabilitation.  She was then discharged  home.      Wilmon Arms, PA      Janetta Hora. Fields, MD  Electronically Signed    KEL/MEDQ  D:  07/14/2008  T:  07/14/2008  Job:  [id number]

## 2010-07-18 NOTE — Assessment & Plan Note (Signed)
OFFICE VISIT   Kelly Barber, Kelly Barber  DOB:  [birth year]                                       06/02/2008  CHART#:[id number]   This patient underwent a left femoral to above-knee popliteal bypass  with left femoral endarterectomy in February 2010.  She was recently  admitted to the hospital for pain control and wound care of several  ulcerations in her left leg.  She presents today for further follow-up.   PAST MEDICAL HISTORY:  Otherwise remarkable for diabetes, hypertension,  hyperlipidemia, depression and anxiety.   During her hospitalization she was evaluated by rheumatology, Dr.  Dareen Piano, and she was restarted on steroids for presumed cutaneous  vasculitis in addition to her occlusive disease.  The patient states she  continues to refrain from smoking.   CURRENT MEDICATIONS:  Metformin 1000 mg twice a day, Lantus insulin 50  units subcu at night, lisinopril/hydrochlorothiazide 20/12.5 once a day.  Travatan eye drops with drops at night time, Wellbutrin 200 mg once a  day, Celexa 20 mg once a day, Atarax 25 mg p.r.n. itching, insulin  sliding scale, aspirin 81 mg once a day, Duragesic patch 50 mcg per hour  changed every 3 days (10 patches were given), Percocet 5/325 mg p.r.n.,  prednisone 10 mg once a day, a 1-week supply was given the time of  discharge.   The patient states today that the pain is improved somewhat in her leg.  Overall she is doing well.  She is receiving home health wound care.   PHYSICAL EXAM:  Blood pressure 133/84 in the left arm, pulse is 91 and  regular.  Left lower extremity has a healing left groin and left medial  thigh incisions.  She still has multiple cutaneous ulcerations which  vary from 1-3 mm in depth, primarily on the left lateral aspect of her  leg around the heel and ankle.  She also has early dry gangrenous  changes of toes 1, 2, and 3 on the left foot.  These are unchanged from  her previous hospital admission.  I  attempted to debride some of the  ulcerations on the back of her leg today but these were too painful for  debridement.   In summary, the patient recently underwent a left fem-pop bypass and  this is patent and she has healing wounds her left leg.  Will change her  wound care today to Collagenase on the ulcerations on her legs to try to  do some enzymatic debridement of the exudate.  She still has dry  gangrenous changes of her left foot and may eventually need a toe  amputation somewhat in the future but hopefully some of this will  continue to heal.   As far as her vasculitis is concerned, she was seen by Dr. Dareen Piano as  an inpatient hospital.  We tried to arrange for an outpatient visit with  Dr. Dareen Piano today.  However, their office is not taking new Medicaid  patient is at this time.  Therefore, I discussed with the patient that  she will need to continue to have treatment of her vasculitis with  prednisone through her doctors at [facility] The patient will follow  up with me for her postoperative fem-pop care and wound care her left  leg in  two weeks' time.   Janetta Hora. Fields, MD  Electronically Signed  CEF/MEDQ  D:  06/03/2008  T:  06/03/2008  Job:  [id number]   cc:   [facility]  Azzie Roup, MD

## 2010-07-18 NOTE — Op Note (Signed)
NAMECARROL, HOUGLAND               ACCOUNT NO.:  [id number]   MEDICAL RECORD NO.:  [id number]          PATIENT TYPE:  INP   LOCATION:  2029                         FACILITY:  [facility]   PHYSICIAN:  Janetta Hora. Fields, MD  DATE OF BIRTH:  August 18, 1965   DATE OF PROCEDURE:  07/12/2008  DATE OF DISCHARGE:                               OPERATIVE REPORT   PROCEDURES:  1. Amputation of toes 1, 2, and 3 with resection of first metatarsal      head.  2. Debridement, left leg.   PREOPERATIVE DIAGNOSIS:  Gangrene toes, left foot, 1, 2, and 3.   POSTOPERATIVE DIAGNOSIS:  Gangrene toes, left foot, 1, 2, and 3.   ANESTHESIA:  General.   ASSISTANT:  Nurse.   OPERATIVE FINDINGS:  Dry gangrenous changes toes 1, 2, and 3, left foot  with pulsatile bleeding from digital vessels.   OPERATIVE DETAILS:  After obtaining informed consent, the patient was  taken to the operating.  The patient was placed in supine position on  the operating table.  After induction of general anesthesia and  endotracheal intubation, the patient's entire left lower extremity was  prepped and draped in the usual sterile fashion.  Next, a  circumferential incision was made around the base of the left first toe.  Incision was carried down through subcutaneous tissues down to the level  of the bone.  Toe was amputated with a bone cutter.  The remainder of  the proximal phalanx was then debrided away with rongeurs.  The  metatarsal head was then resected also with rongeurs.  There was good  bleeding from the tissues with pulsatile bleeding from digital vessels.  Hemostasis was obtained with cautery.  Next in similar fashion, a  circumferential incision was made around the base of the second toe and  the third toe on left foot.  Bone cutter was again used to divide the  proximal phalanx.  The remainder of the proximal phalanx was then  debrided away with rongeurs.  Tissue was taken back to the level but  there was no tension on  the skin edges.  The first toe amputation site  was then loosely approximated with a portion left open as the skin edges  would not approximate easily.  This was packed with normal saline  moistened gauze.  The second and third toe amputation sites were closed  completely with interrupted vertical mattress and simple nylon 3-0  sutures.  The patient still had some eschar on the posterior aspect of  the left foot.  So, this was debrided away sharply over the area of left  Achilles tendon.  There was good bleeding tissue from this as well.  All  wounds were thoroughly irrigated with normal saline  solution.  Dry sterile dressing was applied.  The patient tolerated the  procedure well and there were no complications.  Sponge and needle  counts were correct at the end of the case.  The patient was taken to  the recovery room in stable condition.      Janetta Hora. Fields, MD  Electronically Signed  CEF/MEDQ  D:  07/12/2008  T:  07/13/2008  Job:  [id number]

## 2010-07-18 NOTE — Op Note (Signed)
Kelly Barber, Kelly Barber               ACCOUNT NO.:  [id number]   MEDICAL RECORD NO.:  [id number]          PATIENT TYPE:  INP   LOCATION:  3306                         FACILITY:  [facility]   PHYSICIAN:  Janetta Hora. Fields, MD  DATE OF BIRTH:  09/26/1965   DATE OF PROCEDURE:  04/29/2008  DATE OF DISCHARGE:                               OPERATIVE REPORT   PROCEDURE:  1. Left common femoral endarterectomy.  2. Left femoral to above-knee popliteal bypass with nonreversed      greater saphenous vein.  3. Intraoperative arteriogram x1.   PREOPERATIVE DIAGNOSIS:  Acute on chronic ischemia, right lower  extremity.   POSTOPERATIVE DIAGNOSIS:  Acute on chronic ischemia, right lower  extremity.   ANESTHESIA:  General.   ASSISTANT:  Wilmon Arms, PA-C   OPERATIVE FINDINGS:  1. High-grade posterior plaque with stenosis of left common femoral      artery.  2. Calcified atherosclerotic plaque, distal left superficial femoral      artery.  3. 3.5-4 mm greater saphenous vein.  4. Patent popliteal artery distally with three-vessel runoff to the      foot.   OPERATIVE DETAILS:  After obtaining informed consent, the patient was  taken to the operating room.  The patient was placed in the supine  position on the operating table.  After induction of general anesthesia  and endotracheal intubation, the patient's entire left lower extremity  was prepped and draped in the usual sterile fashion.  Next, a  longitudinal incision was made in the left groin over the area of the  left common femoral artery.  Incision was carried down through the  subcutaneous tissues down to the level of the left common femoral  artery.  The femoral artery was fairly small in diameter, approximately  6 mm.  This was dissected free circumferentially.  There was some  posterior plaque.  The profunda femoris and superficial femoral arteries  were also dissected free circumferentially.  The profunda femoris was  approximately  2-3 mm in diameter.  The superficial femoral artery was 3  mm in diameter.  Next, the patient is given 5000 units of intravenous  heparin.  Vessel loops were used to control the artery proximally and  distally.  A transverse arteriotomy was made just above the level of  femoral bifurcation.  There was a large amount of posterior plaque and  this was obstructing approximately 75-80% of the left common femoral  artery.  An attempt was made to thrombectomize the left superficial  femoral artery.  A #4 Fogarty catheter was passed down the left  superficial femoral artery and I was able to advance this 70 cm all the  way down the leg.  However, on pulling this back, there was only a small  amount of thrombus obtained.  There was also some atherosclerotic plaque  obtained.  There was minimal backbleeding.  It was decided at this point  that the preoperative arteriogram that it showed a superficial femoral  artery occlusion was probably more likely chronic and acute.  At this  point, it was decided to do a  fem-pop bypass.  The amount of plaque in  the left common femoral artery was extensive.  Therefore, a femoral  endarterectomy was performed extending from the femoral bifurcation all  the way up to the proximal left common femoral artery.  Good endpoint  was obtained over 2 large profunda branches.  Again, the profunda was  relatively small, but these were the 2 larger branches that were  dissected free.  Next, the greater saphenous vein was harvested through  several skip incisions down the thigh from the saphenofemoral junction  down to just below the knee.  The above-knee incision on the medial  aspect of the leg was deepened down into the above-knee popliteal space.  The sartorius was reflected laterally.  Popliteal artery was dissected  free circumferentially.  This was fairly soft in character.  Vessel  loops were placed around this.  Next, to determine if the above-knee  popliteal artery  would be suitable for grafting, this was opened  longitudinally with proximal and distal control.  There was some plaque  in the proximal portion of the arteriotomy and this was removed under  direct vision.  The more distal popliteal artery was fairly healthy in  appearance.  The arteriotomy had to be extended down to more suitable  segment of the artery, the entire arteriotomy was approximately 4 cm in  length.  At this point, the vein was harvested ligating it distally with  2-0 silk tie.  The vein was then removed from the several skip incisions  and harvested all the way up at the level of the saphenofemoral  junction.  The vein was of good quality approximately 3.5-4 mm in  diameter throughout its length.  This was then thoroughly flushed with  heparinized saline.  All side branches were inspected and found to be  hemostatic.  The patient was given an additional 5000 units of heparin.  A subsartorial tunnel was created connecting the above-knee incision to  the groin.  The vein was opened on the proximal end longitudinally.  This was then sewn onto the left common femoral artery.  The anastomosis  was approximately 4 cm in length to accommodate the arteriotomy that had  been made for the femoral endarterectomy.  Just prior completion of  anastomosis, everything was forebled, backbled, and thoroughly flushed.  Anastomosis was secured.  Clamps were released.  There was pulsatile  flow in the proximal vein graft immediately.  There was good Doppler  flow in the proximal SFA and profunda femoris branches.  Hemostasis was  obtained.  Next, the valves were lysed with a valvulotome.  The vein  graft was then marked for orientation and brought through the  subsartorial tunnel.  Vessel loops were used to control the popliteal  artery proximally and distally.  The vein was then spatulated on its  distal end and sewn end of vein to side of artery using running 6-0  Prolene suture.  Just prior  completion of anastomosis, this was  forebled, backbled, and thoroughly flushed.  Anastomosis was secured.  Clamps released.  There was pulsatile flow in the distal popliteal  artery immediately.  The patient had a biphasic posterior tibial and  dorsalis pedis Doppler signal which augmented almost 100% with clamping  and unclamping the graft.  Next, intraoperative arteriogram was obtained  with inflow occlusion.  A 21 gauge butterfly needle was introduced into  the proximal vein graft.  With inflow occlusion, a contrast arteriogram  was obtained.  This showed a patent  distal anastomosis to the above-knee  popliteal artery with three-vessel runoff to the foot.  Next, hemostasis  was obtained.  The deep layers were closed with a running 2-0 and 3-0  Vicryl sutures.  The skin of all the incisions was closed with staples.  The patient tolerated the procedure well and there were no  complications.  Instrument, sponge, and needle counts were correct at  the end of the case.  The patient was taken to the recovery room in  stable condition.      Janetta Hora. Fields, MD  Electronically Signed     CEF/MEDQ  D:  04/30/2008  T:  04/30/2008  Job:  [id number]

## 2010-07-18 NOTE — H&P (Signed)
Kelly Barber, Kelly Barber               ACCOUNT NO.:  [id number]   MEDICAL RECORD NO.:  [id number]          PATIENT TYPE:  INP   LOCATION:  2033                         FACILITY:  [facility]   PHYSICIAN:  Di Kindle. Edilia Bo, M.D.DATE OF BIRTH:  11-05-1965   DATE OF ADMISSION:  05/13/2008  DATE OF DISCHARGE:                              HISTORY & PHYSICAL   REASON FOR ADMISSION:  Uncontrolled pain in the left foot.   HISTORY:  This is a pleasant 45 year old patient who had been having  pain in her left foot for several weeks with possible vasculitis.  She  had undergone some biopsies and presented with acute on chronic left  lower extremity ischemia.  She underwent an arteriogram by Dr. Darrick Penna  and on April 29, 2008, underwent a left common femoral artery  endarterectomy and left femoral to above knee bypass with a vein graft.  She had done reasonably well from that standpoint with the exception of  having continued pain in the left foot.  She was evaluated yesterday in  the office, but today her pain was worse, so she presents to the  emergency department.  She is admitted for pain control.   Prior to this event, the patient does describe some bilateral calf  claudication but no history of rest pain or previous history of  nonhealing wounds.   PAST MEDICAL HISTORY:  This is significant for:  1. Insulin-dependent diabetes, which she has had for over 20 years.  2. Hypertension.  3. Hypercholesterolemia.  4. History of anxiety disorder.  5. History of major depression.  6. She denies any history of previous myocardial infarction, history      of congestive heart failure, or history of COPD.   PAST SURGICAL HISTORY:  Significant for her previous left fem-pop bypass  graft as described above, also previous cholecystectomy.   ALLERGIES:  CODEINE.   MEDICATIONS:  1. Metformin 1000 mg p.o. b.i.d.  2. Lantus insulin 50 units subcu nightly.  3. Lisinopril/hydrochlorothiazide 12/12.5 mg  p.o. daily.  4. Omeprazole 20 mg p.o. nightly.  5. Trazodone 50 mg p.o. daily p.r.n.  6. Potassium 20 mEq p.o. daily.  7. Percocet 5/325 one to two tablets every q.4 h. p.r.n.   She had been prescribed Celexa 20 mg p.o. daily, Wellbutrin 150 mg p.o.  daily, Atarax 25 mg nightly p.r.n., but she has not started taking  these.   REVIEW OF SYSTEMS:  GENERAL:  She has had no recent weight loss, weight  gain, or problem with her appetite.  CARDIAC:  She has had no chest  pain, chest pressure, palpitations, or arrhythmias.  PULMONARY:  She has  had no productive cough, bronchitis, asthma, or wheezing.  GASTROINTESTINAL:  She has had no recent change in her bowel habits.  GENITOURINARY:  She has had no dysuria or frequency VASCULAR:  She has  had bilateral calf claudication.  She has rest pain in her left foot  currently.  NEUROLOGIC:  She has had no dizziness, blackouts, headaches,  or seizures.  HEMATOLOGIC:  She has had no bleeding problems or clotting  disorders.  PHYSICAL EXAMINATION:  GENERAL:  This is a pleasant 45 year old woman  who appears her stated age.  VITAL SIGNS:  Temperature is 98.3, heart rate 101, blood pressure  107/76, saturation 100%.  NECK:  Supple.  There is no cervical lymphadenopathy.  I do not detect  any carotid bruits.  LUNGS:  Clear bilaterally to auscultation.  CARDIAC:  She has a regular rate and rhythm.  ABDOMEN:  Obese and difficult to assess.  I cannot palpate an aneurysm.  EXTREMITIES:  She has palpable femoral pulses and palpable popliteal  pulses bilaterally.  She has a palpable dorsalis pedis and posterior  tibial pulse on the right.  Right foot is warm and well perfused without  ischemic ulcer.  On the left side, she has a biphasic anterior tibial  signal and biphasic posterior tibial signal.  Dorsalis pedis signal is  monophasic.  She has blistering on the toes of the left foot, especially  on the great toe.  Currently, there is no significant  drainage or  erythema although the toes look ischemic.  The groin incisions appear  adequately healing, half of her staples were removed yesterday.   IMPRESSION:  This patient presents with uncontrolled pain in the left  foot despite a functioning bypass graft.  I have recommended admission  for intravenous pain medicine and pain control and was started on a  Dilaudid patient-controlled analgesia.  In addition, we will start her  on intravenous antibiotics and begin warm soaks to the foot daily.  I  have reassured her that her bypass graft is functioning well.  I have  notified Dr. Darrick Penna of her admission.      Di Kindle. Edilia Bo, M.D.  Electronically Signed     CSD/MEDQ  D:  05/13/2008  T:  05/14/2008  Job:  [id number]   cc:   Fanny Dance. Rankins, M.D.  Bevelyn Buckles. Bensimhon, MD

## 2010-07-18 NOTE — Op Note (Signed)
Kelly Barber, Kelly Barber               ACCOUNT NO.:  [id number]   MEDICAL RECORD NO.:  [id number]          PATIENT TYPE:  OIB   LOCATION:  6730                         FACILITY:  [facility]   PHYSICIAN:  Almond Lint, MD       DATE OF BIRTH:  [birth year]   DATE OF PROCEDURE:  08/10/2008  DATE OF DISCHARGE:                               OPERATIVE REPORT   PREOPERATIVE DIAGNOSIS:  Abdominal wall abscess.   POSTOPERATIVE DIAGNOSIS:  Abdominal wall abscess.   PROCEDURE PERFORMED:  Incision and drainage, left abdominal wall  abscess.   SURGEON:  Almond Lint, MD   ANESTHESIA:  General.   FINDINGS:  4 x 4 x 3 cm area of multiloculated abscess and necrotic skin  and soft tissue.   SPECIMEN:  Aerobic and anaerobic culture and Gram stain to Microbiology.   ESTIMATED BLOOD LOSS:  Minimal.   COMPLICATIONS:  None known.   PROCEDURE:  Ms. Puller was identified in the holding area, taken to the  operating room where she was placed supine on the operating room table.  General anesthesia was induced.  Her abdomen was prepped and draped in a  sterile fashion.  Time-out was performed according to the surgical  safety check list.  When all was correct, we continued.  The area of  concern had several areas that were draining already.  These were probed  and cultured with aerobic and anaerobic culture swabs.  There was an  elliptical area of skin that was removed with multiple areas of drainage  and necrosis.  The skin was nonviable.  Once this was removed that area  was explored with a [employer].  The area did go down to the  anterior surface of the abdominal wall.  There was a large cavity that  tracked in a downward direction.  The subcutaneous fat that was very  infected and necrotic was also removed.  A piece of this was sent for  culture as well.  The cavity was copiously irrigated.  The Bovie was  used to coagulate several small  areas of oozing.  The cavity was then packed with moist  Kerlix.  The  wound was then cleaned and dried and dressed with gauze, ABDs, and paper  tape.  The patient was awakened from anesthesia and taken to the PACU in  stable condition.      Almond Lint, MD  Electronically Signed     Almond Lint, MD  Electronically Signed    FB/MEDQ  D:  08/10/2008  T:  08/11/2008  Job:  [id number]

## 2010-07-18 NOTE — Assessment & Plan Note (Signed)
OFFICE VISIT   Kelly Barber, Kelly Barber  DOB:  21-Sep-1965                                       07/07/2008  CHART#:[id number]   The patient returns for followup today.  She is a 45 year old female who  underwent a left femoral to above knee popliteal bypass and left common  femoral endarterectomy on February 25.  This was done with vein.  She  returns today for further followup.  She previously had dry gangrenous  changes of toes one, two and three on the left foot.  There are overall  stable in appearance.  There has been no progression of the gangrene.  The ulcerations on her posterior ankle and medial thigh are all in  various stages of granulation but healing overall.  She states that she  has severe pain especially in the left first toe.   PAST MEDICAL HISTORY:  Diabetes, hypertension, dyslipidemia, anxiety  disorder, depression, vasculitis.   MEDICATIONS:  1. Metformin 1000 mg twice a day.  2. Lantus insulin 50 units subcu at night.  3. Lisinopril/hydrochlorothiazide 20/12.5 once a day.  4. Travatan eye drops both eyes at night.  5. Wellbutrin 200 mg p.o. daily.  6. Celexa 20 mg once a day.  7. Atarax 25 mg p.r.n. itching.  8. NovoLog sliding scale insulin.  9. Aspirin 81 mg once a day.  10.Percocet 5/325 every 4 hours for pain.  11.Prednisone 10 mg in the morning.   PHYSICAL EXAMINATION:  Vital signs:  Blood pressure is 160/90 in the  left arm, pulse is 121 and regular.  HEENT:  Unremarkable.  Neck:  Has  2+ carotid pulses.  Chest:  Clear to auscultation.  Heart:  Exam is  regular rate and rhythm.  Abdomen:  Is obese, soft, nontender,  nondistended.  Extremities:  She has 2+ femoral pulse on the right side.  On the left side she has a 2+ left femoral pulse.  She has a slight  opening of her lower thigh incision which has granulation tissue at the  base.  The left first, second and third toes all have dry gangrenous  changes.  She has multiple  ulcerations on heel, ankle and lower third of  the leg.  These are all granulating and in various stages of healing.   The patient overall is doing okay.  She had ABIs performed recently  which suggest that she has adequate perfusion for healing at this point.  She has pain from the gangrenous changes of her left first toe.  I  believe  she would benefit at this point from amputation of toes one,  two and three on the left foot.  We will also debride her leg while she  is in the operating room for this procedure.  This is all scheduled for  07/12/2008.  Risks, benefits, possible complications and procedure  details were explained to the patient today including but not limited to  bleeding, infection, nonhealing wound.  She understands and agrees to  proceed.   Janetta Hora. Fields, MD  Electronically Signed   CEF/MEDQ  D:  07/07/2008  T:  07/08/2008  Job:  [id number]   cc:   Melvern Banker

## 2010-07-18 NOTE — Assessment & Plan Note (Signed)
OFFICE VISIT   Kelly Barber, Kelly Barber  DOB:  [birth year]                                       10/20/2008  CHART#:[id number]   This patient returns for follow-up today.  She previously underwent left  femoral endarterectomy with left femoral to above-knee popliteal bypass  with vein and then subsequent amputation of toes 1, 2 and 3 on the left  foot.  She had been slowly healing the foot wound and presents today for  further follow-up.   The amputation site has now completely healed except for a 2-3 mm area  over the dorsal foot with some surrounding erythema.  A small scab was  pulled off this, but there was no expressible pus or significant  drainage.  She still has some mild depression and is continuing with  counseling for that.   Overall she is doing well.  We will place her in our graft surveillance  protocol at this point.  She will return sooner if she has any further  problems with the left foot.   Janetta Hora. Fields, MD  Electronically Signed   CEF/MEDQ  D:  10/20/2008  T:  10/21/2008  Job:  [id number]

## 2010-07-18 NOTE — Consult Note (Signed)
NAMEJEYDI, KLINGEL               ACCOUNT NO.:  [id number]   MEDICAL RECORD NO.:  [id number]          PATIENT TYPE:  INP   LOCATION:  2033                         FACILITY:  [facility]   PHYSICIAN:  Antonietta Breach, M.D.  DATE OF BIRTH:  [birth year]   DATE OF CONSULTATION:  05/04/2008  DATE OF DISCHARGE:                                 CONSULTATION   REQUESTING PHYSICIAN:  Janetta Hora. Fields, MD   REASON FOR CONSULTATION:  Anxiety and depression.   HISTORY OF PRESENT ILLNESS:  Kelly Barber is a 45 year old female admitted  to the [facility] on April 29, 2008, for an occluded femoral  artery.   She is status post left femoral endarterectomy and left fem-popliteal  bypass.  She has ongoing ischemia.   She does have intrusive recollections of trauma as well as cues that  arouse the recollections.  She has regular feeling on edge and muscle  tension.   The general medical environment and her pain have been cues.   Ms. Coury does describe a return of approximately 3 weeks of depressed  mood, decreased energy, and anhedonia.  She also has had some decreased  concentration, however, she still has some interest, she has hope and  constructive future goals.   PAST PSYCHIATRIC HISTORY:  In review of the past medical record in June  2007, Ms. Billiot was on Cymbalta.  She also has had trials on Wellbutrin  300 mg per day and Celexa at 60 mg daily.  The combination of the two  did work well.   She has no history of suicide attempts.  She has no history of increased  energy or decreased need for sleep.  She has no history of  hallucinations.  Trazodone resulted in a metal taste in her mouth.   FAMILY PSYCHIATRIC HISTORY:  None known.   SOCIAL HISTORY:  She does not use any alcohol or illegal drugs.  She is  medically disabled.  She has a son who is 108 years old.  She has a  daughter.  Marital status, divorced.   PAST MEDICAL HISTORY:  Diabetes mellitus, hypercholesterolemia,  hypertension, and fibromyalgia.  See above for other general medical  history.   She also has a history of a goiter and now has hypothyroidism.  She has  been on Synthroid.   She has no known drug allergies.   LABORATORY DATA:  Hemoglobin A1c is 11.4.  Sodium is 134, BUN 12,  creatinine 0.79, and glucose 248.  WBC 14.7, hemoglobin 12.8, and  platelet count 260.   REVIEW OF SYSTEMS:  Constitutional, head, eyes, ears, nose, and throat,  mouth, neurologic, psychiatric, cardiovascular, respiratory,  gastrointestinal, genitourinary, skin, musculoskeletal, hematologic,  lymphatic, endocrine, and metabolic all unremarkable.   PHYSICAL EXAMINATION:  VITAL SIGNS:  Temperature 99.1, pulse 104,  respiratory rate 20, and blood pressure 98/65.  GENERAL APPEARANCE:  Ms. Hossain is a middle-aged female lying in a  supine position in her hospital bed with no abnormal involuntary  movements.  MENTAL STATUS:  Ms. Sabater is alert.  Her eye contact is intact.  Her  attention span  is normal.  Concentration is normal.  Affect is mildly  anxious.  Mood is mildly anxious.  She is oriented to all spheres.  Her  memory function is intact to immediate recent and remote.  Her fund of  knowledge and intelligence are within normal limits.  Speech involves  normal rate and prosody without dysarthria.  Thought process is logical,  coherent, and goal directed.  No looseness of associations.  Thought  content, no thoughts of harming herself or others.  No delusions or  hallucinations.  Insight is intact.  Judgment is intact.   ASSESSMENT:  AXIS I:  A.  293.84 anxiety disorder, not otherwise specified.  B.  293.83 mood disorder, not otherwise specified.  C.  296.33 major depressive disorder, recurrent, severe.  AXIS II:  None.  AXIS III:  See past medical history.  AXIS IV:  General medical.  AXIS V:  55.   Ms. Iacovelli is not at risk to harm herself or others.  She agrees to call  emergency services immediately  for any thoughts of harming herself,  thoughts of harming others or distress.   The undersigned provided ego supportive psychotherapy and education.   The indications, alternatives, and adverse effects of hydroxyzine,  Celexa, and Wellbutrin were discussed including the risk of Wellbutrin  seizure.  She understands and wants to proceed as below.   RECOMMENDATIONS:  1. We would have the social worker set up followup for Ms. Fenton Malling with      Psychiatry within 1 week of discharge.  Clinics are available at      [facility], [facility], and [facility].  2. We would start hydroxyzine at 25-50 mg nightly p.r.n. insomnia and      25 mg p.o. t.i.d. p.r.n. anxiety.  We would start Celexa at 10 mg      p.o. q.a.m. for anti-depression and antianxiety and we would      titrate the Celexa to 20 mg q.a.m. by the fourth day.  The Celexa      can eventually be titrated to 60 mg per day if needed by her      outpatient psychiatrist.  3. We would start the Wellbutrin for anti-depression augmentation at      100 mg SR p.o. q.a.m. and then titrate over the next week to 150 mg      SR p.o. b.i.d. at 8 a.m. and 4 p.m.  4. Ms. Dutil agrees to not drive if drowsy once she is rehabilitated   In her outpatient psychiatric care, she could also benefit from  cognitive behavioral therapy combined with deep breathing and  progressive muscle relaxation.      Antonietta Breach, M.D.  Electronically Signed     JW/MEDQ  D:  05/17/2008  T:  05/17/2008  Job:  [id number]

## 2010-07-18 NOTE — Consult Note (Signed)
NAMECEDRICKA, Kelly NO.:  [id number]   MEDICAL RECORD NO.:  [id number]          PATIENT TYPE:  INP   LOCATION:  2033                         FACILITY:  [facility]   PHYSICIAN:  Lurena Nida, MD   DATE OF BIRTH:  1965/09/06   DATE OF CONSULTATION:  DATE OF DISCHARGE:                                 CONSULTATION   REASON FOR CONSULTATION:  Vasculitis.  It has been placed by Dr. Janetta Hora. Fields, Vascular Surgery.   HISTORY OF PRESENT ILLNESS:  [name] is a very nice 45 year old  female with a long list of medical problems including insulin-dependent  diabetes, hypertension, hypercholesterolemia, who in the early-to-mid  part of February developed a peculiar annular rash along the left lower  leg.  She eventually had this biopsied and the findings showed a  polymorphonuclear predominant leukocytoclastic vasculitis.  The patient  was apparently placed on some prednisone at [facility] and had mostly  resolution of the rash.  Then, in late February, on April 29, 2008,  she ended up being admitted with ischemia of the left foot due to  superficial femoral artery occlusion, and eventually received a left  femoral popliteal bypass graft.  She was readmitted on May 13, 2008,  with intractable pain in the left foot, and it was noted that while she  was on the hospital, she again had began to develop this very same rash  over the right lower extremity.  She says the rash is fairly  asymptomatic other than being mildly pruritic.  She had never really  experienced any kind of rash like this before.  She denies any malar  erythema.  Denies any significant alopecia.  Denies any lymphadenopathy,  any oral ulcers, acute visual changes, and a sudden cardiopulmonary  symptoms.  She notes that she has been on various medicines over the  course of the last 2 months, and has some difficulty naming which ones  other than amoxicillin and Neurontin that she had been exposed  to, that  she did not chronically take.  She denies any unexplained fevers or  weight loss.  She denies any swelling or stiffness in her wrist or MCP  joint.  Interestingly, during her hospital stay, she has been noted to  be HIV positive by the ELISA method.   PAST MEDICAL HISTORY:  1. Insulin-dependent diabetes.  2. Hypertension.  3. Hypercholesterolemia.  4. Anxiety.  5. Major depressive disorder.  6. Left femoropopliteal bypass graft.   MEDICATIONS AT DISCHARGE:  Metformin, Lantus, lisinopril and  hydrochlorothiazide, omeprazole, trazodone, Percocet, and potassium.  Her inpatient medicine list has been reviewed and includes:  1. Cymbalta 30 mg a day.  2. Prednisone at 20 mg a day.   SOCIAL HISTORY:  She has a remote history of tobacco use.  She denies  any significant alcohol use.  She denies any illicit drugs.   FAMILY HISTORY:  She has a maternal aunt with systemic lupus, otherwise  no autoimmune disease.   ALLERGIES:  Her allergy list has been reviewed.   REVIEW OF SYSTEMS:  Reviewed and they are entirely negative  except for  HPI and past medical history.   PHYSICAL EXAMINATION:  VITAL SIGNS:  In the chart has been reviewed.  GENERAL:  She is a well-appearing healthy female in no acute distress.  EYES:  No scleritis.  No periorbital edema.  HEAD AND NECK:  Oropharynx is moist.  No oral ulcers are noted.  There  is no alopecia.  Neck is supple, nontender, no obvious goiter.  On lymph  exam, there is cervical or supraclavicular lymphadenopathy.  PSYCH:  She appears to be alert and oriented.  Good insight and  judgment.  Bright affect.  LUNGS:  Clear bilaterally.  No wheezes or crackles.  HEART:  Regular.  No murmurs, rubs, or gallops.  SKIN:  She has a substantial bandaging over the left foot up to the  knee.  In the right leg, there are macular annular lesions with some  suggestion of central necrosis, largest of which is perhaps a dime with  most being smaller.   They are somewhat confluent actually over the calf  and they are all distal to the knee.  There is no obvious ulcerations  though.  MUSCULOSKELETAL:  She has no synovitis to the elbows, wrist, MCPs, or  PIP.  She has about 1+ pitting edema in the right foot.  No obvious  metatarsal synovitis.  No enthesitis in the Achilles tendon.   LABORATORY DATA:  Labs obtained today shows a white count of 10.9,  hemoglobin 11.1, platelets of 515.  Good creatinine.  Her urine  contained moderate blood with a few leukocytes.  C3 compliment slightly  elevated at 238, C4 normal at 24.  Protein electrophoresis largely  unremarkable.  Hepatitis B and C antibodies are negative.  She is HIV  positive and has a CD4 count of 780, Western blot on the HIV appears to  be pending.   IMPRESSION:  This is a 45 year old female who has biopsy proven  polymorphonuclear predominant leukocytoclastic vasculitis with no  history or exam findings to suggest a systemic vasculitis.  I think in  this postoperative period, it will be reasonable to continue the  prednisone at 20 mg a day.  At the time of this note, the ANCA screen is  still pending, though, I have a low suspicion that this is an ANCA-  induced vasculitis.  This is most likely specifically speaking to be due  to some medicine she has been exposed to in the last few months.  However, human immunodeficiency virus related vasculitis is certainly  something that could be entertained.  Cryoglobulins are also still  pending.  However, her negative hepatitis C screen will make this less  likely.   RECOMMENDATIONS:  1. Continue the prednisone at 20 mg a day until after discharge.      Continue to await the results of the cryoglobulin and ANCA testing.  2. We will continue to follow along with this patient.      Lurena Nida, MD  Electronically Signed     AA/MEDQ  D:  05/20/2008  T:  05/21/2008  Job:  [id number]

## 2010-07-18 NOTE — Discharge Summary (Signed)
Kelly Barber, Kelly Barber               ACCOUNT NO.:  [id number]   MEDICAL RECORD NO.:  [id number]          PATIENT TYPE:  INP   LOCATION:  2040                         FACILITY:  [facility]   PHYSICIAN:  Janetta Hora. Fields, MD  DATE OF BIRTH:  1965-05-09   DATE OF ADMISSION:  04/29/2008  DATE OF DISCHARGE:  05/05/2008                               DISCHARGE SUMMARY   FINAL DISCHARGE DIAGNOSES:  1. Ischemic left lower extremity.  2. Peripheral vascular disease.  3. Diabetes, insulin dependent.  4. Hypertension.  5. Dyslipidemia.  6. Anxiety disorder.  7. Major depression.   PROCEDURES PERFORMED:  April 29, 2008, left common femoral  endarterectomy with left femoral to above-knee popliteal bypass grafting  with non-reversed greater saphenous vein with an intraoperative  arteriogram x1 by Dr. Darrick Penna.   COMPLICATIONS:  None.   CONDITION AT DISCHARGE:  Stable, improving.   DISCHARGE MEDICATIONS:  1. Metformin 1000 mg p.o. b.i.d.  2. Lantus 50 units nightly subcu.  3. Lisinopril/hydrochlorothiazide 20/12.5 p.o. daily.  4. Omeprazole 20 mg p.o. nightly.  5. Trazodone 50 mg p.r.n. p.o.  6. Potassium 20 mEq p.o. daily.  7. NovoLog 10 units subcu prior to meals.  8. She is given prescriptions for Percocet 5/325 one to two tablets      p.o. q.4 h. p.r.n. pain, total #40 was given.  9. Neurontin 300 mg p.o. q.12 h.  10.Celexa 20 mg p.o. daily.  11.Wellbutrin 150 mg p.o. daily.  12.Atarax 25 mg p.o. nightly p.r.n. pain.   DISPOSITION:  She is being discharged home in stable condition with her  wounds healing well.  She is given careful instructions regarding the  care of her wounds and her activity levels.  She is given an appointment  to see Dr. Darrick Penna in 3 weeks with ABIs and staple removal in 7-10 days.  The office will arrange a visit.  She is also to obtain an appointment  with Dr. Jeanie Sewer next week.   BRIEF IDENTIFYING STATEMENT:  For complete details, please refer the  typed  history and physical.  Briefly, this 45 year old woman has been  followed for the last 3 weeks due to numbness in her toes and red rash  all over toes and insteps.  She has also been seen by Dermatology, and  has undergone several punch biopsies of the rash looking for lupus.  It  was thought to be vasculitis.  She was started on prednisone and  Dilaudid.  Her left foot has became progressively worse and was covered  with dark spots.  She was sent to the emergency room and seen by Dr.  Darrick Penna and found to have an ischemic left lower extremity.  Dr. Darrick Penna  recommended aortogram and to be followed by other procedures as  indicated.  She was informed of the risks and benefits of the procedure  and after careful consideration, elected to proceed with surgery.   HOSPITAL COURSE:  She was taken to the angio suite, and an aortogram was  performed, demonstrating profound ischemia consistent of a left SFA  occlusion with slow runoff to the posterior tibial  and anterior tibial  trunks.  Dr. Darrick Penna recommended proceeding with thrombectomy and  possible left femoral to popliteal bypass grafting.  She was informed of  the risks and benefits of the procedure and after careful consideration,  elected to proceed with surgery.   She was taken to the operating room and underwent the aforementioned  endarterectomy with left femoral-popliteal bypass grafting.  For  complete details, please refer the typed operative report.  She  tolerated the procedure and was returned to the Post Anesthesia Care  Unit, extubated.  Following stabilization, she was transferred to a bed  on a Surgical Step-Down Unit.  She was observed overnight and was able  to be transferred to a bed on a surgical convalescent floor.   Her diet and activities were advanced as tolerated.  She experienced a  major depressive episode.  We asked Dr. Jeanie Sewer to evaluate this and  appreciate his diligent efforts.  Her mental status improved.   On May 05, 2008, she was desirous of discharge and was discharged home in stable  condition.      Wilmon Arms, PA      Janetta Hora. Fields, MD  Electronically Signed    KEL/MEDQ  D:  05/05/2008  T:  05/06/2008  Job:  [id number]

## 2010-07-18 NOTE — Discharge Summary (Signed)
NAMEMARLIS, Kelly Barber               ACCOUNT NO.:  [id number]   MEDICAL RECORD NO.:  [id number]          PATIENT TYPE:  INP   LOCATION:  2033                         FACILITY:  [facility]   PHYSICIAN:  Janetta Hora. Fields, MD  DATE OF BIRTH:  Aug 27, 1965   DATE OF ADMISSION:  05/13/2008  DATE OF DISCHARGE:  05/26/2008                               DISCHARGE SUMMARY   FINAL DISCHARGE DIAGNOSES:  1. Uncontrolled pain in the left foot status post peripheral vascular      disease and left femoral to above knee bypass graft on April 29, 2008.  2. Insulin-dependent diabetes mellitus.  3. Hypertension.  4. Dyslipidemia.  5. History of anxiety disorder.  6. History of major depression.   PROCEDURES PERFORMED:  Conservative treatment of left lower extremity  wounds from ischemic changes.   COMPLICATIONS:  None.   DISCHARGE MEDICATIONS:  She is instructed to resume all previous  medications consisting of:  1. Metformin 1000 mg p.o. b.i.d.  2. Lantus 50 units subcu nightly.  3. Lisinopril/hydrochlorothiazide 20/12.5 p.o. daily.  4. Travatan eyedrops both eyes nightly.  5. Wellbutrin 200 mg p.o. daily.  6. Celexa 20 mg p.o. daily.  7. Atarax 25 mg p.r.n. itching.  8. NovoLog sliding scale insulin.  9. She is also given prescriptions for baby aspirin daily p.o.  10.Duragesic patch 50 mcg per hour change every 3 days, total number      of 10 patches were given.  11.Percocet 5/325 one p.o. q.4 h. p.r.n. breakthrough pain, total      number of 40 were given.  12.Prednisone 10 mg p.o. q.a.m. for 1 week.   DISPOSITION:  She is being discharged home in stable condition with her  wounds healing well and her pain controlled.  She is scheduled to see  Dr. Darrick Penna next Wednesday.  The office will arrange a visit, and she  will also be given an appointment with Rheumatology Clinic next week  also.   BRIEF IDENTIFYING STATEMENT:  For complete details, please refer the  typed history and  physical.  Briefly, this very pleasant 45 year old  woman underwent an emergency femoral to posterior popliteal bypass  grafting on April 14, 2008.  She was discharged home after a  reasonably uncomplicated hospital course.  She returned to the emergency  department complaining of intractable pain in her left foot.  The foot  was evaluated and was swallowing, and she was admitted for pain control.   HOSPITAL COURSE:  She was admitted to a bed on a surgical convalescent  floor.  She was placed on a PCA.  Her pain was controlled.  She did have  continuing ischemic changes on her left lower extremity, which were  healing.  She was started on hydrotherapy for this.  Several days into  her hospital stay, she developed a rash.  We asked Rheumatology to  evaluate this.  She was found to have a vasculitis.  During workup for  the vasculitis, she underwent HIV screening.  It was found to be  negative at this time.   Over the  next several days, we worked for pain control.  This was  eventually brought under good control with Duragesic patch 50 mcg and  Percocet for breakthrough pain.  Her wounds are now healing and looking  very well.  We feel that she is stable and ready for discharge.  She is  being discharged home today in stable condition and will receive home  health physical therapy and nursing for dressing changes.      Wilmon Arms, PA      Janetta Hora. Fields, MD  Electronically Signed    KEL/MEDQ  D:  05/26/2008  T:  05/26/2008  Job:  [id number]

## 2010-07-18 NOTE — Op Note (Signed)
Kelly Barber, Kelly Barber               ACCOUNT NO.:  [id number]   MEDICAL RECORD NO.:  [id number]          PATIENT TYPE:  INP   LOCATION:  3306                         FACILITY:  [facility]   PHYSICIAN:  Janetta Hora. Fields, MD  DATE OF BIRTH:  [birth year]   DATE OF PROCEDURE:  DATE OF DISCHARGE:                               OPERATIVE REPORT   PROCEDURE:  Aortogram with left lower extremity runoff.   PREOPERATIVE DIAGNOSIS:  Ischemia left lower extremity.   POSTOPERATIVE DIAGNOSIS:  Ischemia left lower extremity.   ANESTHESIA:  Local.   OPERATIVE FINDINGS:  1. Right femoral 5-French sheath.  2. Left superficial femoral artery occlusion.  3. Extremely slow runoff via the posterior tibial and anterior tibial      arteries.  4. Diminutive peroneal artery, left leg.   OPERATIVE DETAILS:  After obtaining informed consent, the patient was  taken to the [facility] lab.  The patient was placed in the supine position on  the angio table.  Both groins were prepped and draped in the usual  sterile fashion.  Local anesthesia was infiltrated over the right common  femoral artery.  Introducer needle was used to cannulate the right  common femoral artery, and a 0.035 Versacore wire introduced into the  right common femoral artery and up in the abdominal aorta under  fluoroscopic guidance.  Next, a 5-French sheath was placed over the  guidewire in the right common femoral artery.  A 5-French pigtail  catheter was then placed over the guidewire and abdominal aortogram was  obtained.  This shows bilateral single renal arteries which are patent.  There is mild atherosclerotic change of the distal abdominal aorta.  There is a 25% stenosis of the right common iliac artery.  There is no  significant flow-limiting stenosis of the left common iliac artery.  External and internal iliac arteries were patent bilaterally.  Internal  iliac arteries were small bilaterally.  Next, the pigtail catheter was  removed over a  guidewire.  A 5-French crossover catheter was then  brought up in the operative field and this was used to selectively  catheterize the left common iliac artery.  A 0.035 angled Glidewire was  then advanced down into the distal left external iliac artery and into  the common femoral artery.  The crossover catheter was then removed over  the guidewire, and a 5-French straight catheter placed over the  guidewire down into the left external iliac artery.  Left lower  extremity arteriogram was then obtained.  This shows a 50% stenosis of  left profunda femoris artery.  The left common femoral artery is patent.  The left superficial femoral artery is patent in its proximal portion  and then occludes in the mid leg.  There was then reconstitution of the  above-knee popliteal artery via collaterals.  There is very slow filling  of the distal below-knee popliteal artery as well as the tibial vessels.  There was almost a 16-second delay between filling of the SFA and  filling down the tibial vessels.  The anterior tibial and posterior  tibial arteries are patent all the  way down into the foot.  The peroneal  artery is patent in its proximal portion, but was diminutive in the  lower leg.   Next, several peak hold views were obtained of the lower leg and lateral  foot.  These confirmed the above findings.   At this point, the straight catheter was removed over the guidewire.  The 5-French sheath was left in place to be pulled in the holding area.  The patient tolerated the procedure well, and there were no  complications.  The patient was taken to the holding area in stable  condition.      Janetta Hora. Fields, MD  Electronically Signed     CEF/MEDQ  D:  04/29/2008  T:  04/30/2008  Job:  [id number]

## 2010-07-18 NOTE — Assessment & Plan Note (Signed)
OFFICE VISIT   Kelly Barber, Kelly Barber  DOB:  [birth year]                                       08/04/2008  CHART#:[id number]   The patient returns for followup today after amputation of toes 1, 2 and  3 on May 10.  This was done on the left leg.   On exam today the toe amputation sites are healing well at toes 2 and 3.  The open portion of the wound at the first toe amputation site is  granulating.  The thigh and leg wounds are all essentially healed at  this point.  Of note, she showed me a red spot on her left upper abdomen  where she had previously given herself an insulin shot.  This area was  indurated and erythematous and approximately 4 cm in diameter.  There  was no obvious palpable abscess.  She stated that she previously  developed an abscess after a similar type injection wound.   As far as the patient's foot is concerned the wounds are continuing to  heal.  She will follow up in three weeks' time and at that time will  consider removing the sutures.  She will continue wound care on the left  first toe.  As far as the cellulitis on her abdomen was concerned I  placed her on Augmentin twice a day today.  She was given enough to last  2 weeks.  I advised her to have close followup with [facility] or if  the wound becomes worse in nature she may need a visit to the emergency  room.  She was also given a prescription of Percocet 5/325 #30 dispensed  today.  She will follow up with me in three weeks' time.   Janetta Hora. Fields, MD  Electronically Signed   CEF/MEDQ  D:  08/04/2008  T:  08/05/2008  Job:  [id number]   cc:   Melvern Banker

## 2010-07-18 NOTE — Consult Note (Signed)
Kelly Barber, Kelly Barber               ACCOUNT NO.:  [id number]   MEDICAL RECORD NO.:  [id number]          PATIENT TYPE:  INP   LOCATION:  2033                         FACILITY:  [facility]   PHYSICIAN:  Antonietta Breach, M.D.  DATE OF BIRTH:  1965-12-12   DATE OF CONSULTATION:  05/19/2008  DATE OF DISCHARGE:                                 CONSULTATION   INPATIENT CONSULTATION FOLLOWUP   Ms. Blasco just received information about having a very threatening  general medical illness.  She has been crying today but has also been  receiving support from family members and staff.  She is not having  thoughts of harming herself.  She has no hallucinations or delusions.  Her orientation and memory function are intact.  She has no thoughts of  harming others.   She did not yet start the Wellbutrin and Celexa.  She does continue with  depressed mood, low energy, easy crying, decreased concentration.   She provided additional past history regarding her Cymbalta trial.  She  recalls that it was effective.  She notes that she was also having her  thyroid medicine adjusted at that time as well.   [facility]; however, after her thyroid problem was stabilized, her Cymbalta  regimen was discontinued.   She has not been on Cymbalta since 2007.   PHYSICAL EXAMINATION:  VITAL SIGNS:  Temperature 96.8, pulse 94,  respiratory rate 18, blood pressure 110/75, O2 saturation on room air  99%.  MENTAL STATUS:  Ms. Drum is alert.  Her eye contact is good.  Her  attention span is mildly decreased.  Concentration mildly decreased.  Affect involves constriction with tears.  Mood is depressed.  However,  she does mention that she has hope and has had support from a number of  friends and family.  She has the motivation to fight her general medical  problems.  She is oriented to all spheres.  Her memory function is  intact to immediate, recent, and  remote.  Speech involves normal rate  and prosody without dysarthria.  Thought process is logical, coherent,  goal-directed.  No looseness of association or thought content.  No  thoughts of harming herself or others.  No delusions or hallucinations.  Please see the above.  Her insight is intact.  Judgment is intact.   ASSESSMENT:   AXIS I:  1. Mood disorder, not otherwise specified (idiopathic and general      medical factors, depressed).  2. Major depressive disorder, recurrent, severe.   Ms. Radebaugh is not at risk to harm herself or others.  She does agree to  call emergency services immediately for any thoughts of harming herself,  thoughts of harming others or distress.   The undersigned provided ego-supportive psychotherapy and education.  The indications, alternatives, and adverse effects of Cymbalta were  discussed with the patient for antidepressant.  She understands and  wants to start Cymbalta.  She also recalls that it can help with pain.   RECOMMENDATIONS:  1. Would start Cymbalta 30 mg p.o. q.a.m.  and then within 3 days      titrate within 60 mg daily.  2. Would ask the social worker to set Ms. Tappan up with outpatient      psychiatric care during the first week of discharge.  Clinics are      available at [facility], [place], or [facility].      [facility] is also the [facility].  She could also      greatly benefit from psychotherapy.      Antonietta Breach, M.D.  Electronically Signed     JW/MEDQ  D:  05/19/2008  T:  05/19/2008  Job:  [id number]

## 2010-07-18 NOTE — Assessment & Plan Note (Signed)
OFFICE VISIT   Kelly Barber, Kelly Barber  DOB:  12-23-1965                                       05/12/2008  CHART#:[id number]   The patient returns for followup today after her left femoral to above  knee popliteal bypass on 04/29/2008.  This was done with vein.  She  states that she is still having significant pain in the left first toe.  Most of her incisional pain however has resolved.  She has been to the  emergency room on two separate occasions earlier this week for pain  medication.  She was given Dilaudid on each occasion.  She states that  the pain is continuous in nature but worse at the end of the day and at  night time.   On physical exam today blood pressure is 129/84 in the left arm and  pulse is 105 and regular.  Left lower extremity incisions are healing  well in the groin and thigh.  She has some edema in the left foot and  pulse is difficult to palpate.  She has a triphasic Doppler signal at  the posterior tibial area and a monophasic dorsalis pedis signal and the  peroneal Doppler signal is also triphasic.  Every other staple was  removed from the left groin today.  The remainder of the staples in the  thigh were also removed.  The left foot has dusky areas in toes 1, 2 and  3.  There is some blistering on the left lateral heel area.  There is  also a small blister on the dorsal left first toe.   I reassured the patient that hopefully these pain symptoms will resolve  now that she has had restored blood flow to her left foot.  However, I  did inform her today that the appearance of toes 1, 2 and 3 is such that  they are at risk and may need amputation at some point in the future.  We will start doing dressing changes with Xeroform gauze over the  blistering on her left foot.  She will follow up with me in two weeks'  time to see if she has had some improvement in her pain symptoms as well  as the overall appearance of her foot.  Her bypass graft  appears to be  patent and working well.  She was given a prescription today of Dilaudid  4 mg 60 tablets dispense one p.o. every 6 hours p.r.n. for pain.  We  will not refill this further.  If she continues to have unrelenting pain  I believe the best option will be a toe amputation not further  narcotics.  I discussed this with the patient today.   Janetta Hora. Fields, MD  Electronically Signed   CEF/MEDQ  D:  05/12/2008  T:  05/13/2008  Job:  [id number]

## 2010-07-18 NOTE — Assessment & Plan Note (Signed)
OFFICE VISIT   Kelly Barber, Kelly Barber  DOB:  03/25/1965                                       08/25/2008  CHART#:[id number]   The patient returns for followup today after amputation of toes one, two  and three on the left foot.  This was done in May of 2010.   On exam today the first toe amputation site is continuing to granulate.  There is an area approximately 3 x 2 cm which is open but has a very  good pink granulation base.  The other two toe amputation sites have  healed well and sutures were removed today.   Overall the patient appears well.  She will follow up in two weeks' time  to reassess her wound and make sure it is continuing to heal.  She will  be due for a protocol scan soon.  We may schedule this with her next  office visit.  If not we will try to get her back on protocol after that  point.   Janetta Hora. Fields, MD  Electronically Signed   CEF/MEDQ  D:  08/25/2008  T:  08/26/2008  Job:  [id number]

## 2010-07-21 NOTE — Op Note (Signed)
Kelly Barber, Kelly Barber               ACCOUNT NO.:  [id number]   MEDICAL RECORD NO.:  [id number]          PATIENT TYPE:  AMB   LOCATION:  SDC                           FACILITY:  [facility]   PHYSICIAN:  Roseanna Rainbow, M.D.DATE OF BIRTH:  10-Jul-1965   DATE OF PROCEDURE:  08/31/2005  DATE OF DISCHARGE:                                 OPERATIVE REPORT   PREOPERATIVE DIAGNOSIS:  Hypertrophied right labia minora.   POSTOPERATIVE DIAGNOSIS:  Right labia minora cyst.   PROCEDURE:  Revision of right labia minora.   SURGEON:  Roseanna Rainbow, M.D.   ANESTHESIA:  Monitored anesthesia care, local anesthetic.   PATHOLOGY SPECIMEN:  Right labia minora.   ESTIMATED BLOOD LOSS:  Minimal.   COMPLICATIONS:  None.   PROCEDURE:  The patient is taken to the operating room with an IV running.  She was placed in the dorsal lithotomy position and prepped and draped in  the usual sterile fashion.  The incision area was then infiltrated with 1%  lidocaine.  The redundant part of the labia minora including the cyst was  then excised with the scalpel.  Hemostasis was then obtained with  electrocautery.  The skin edges were then reapproximated with interrupted  sutures of 3-0 Vicryl.  Adequate hemostasis was noted.  At the close of the  procedure, the instrument and pack counts were said to be correct x2.  The  patient was taken to the PACU awake and in stable condition.      Roseanna Rainbow, M.D.  Electronically Signed     LAJ/MEDQ  D:  08/31/2005  T:  08/31/2005  Job:  [id number]

## 2010-07-21 NOTE — H&P (Signed)
NAMEGIANNAMARIE, PAULUS               ACCOUNT NO.:  [id number]   MEDICAL RECORD NO.:  [id number]          PATIENT TYPE:  INP   LOCATION:  1515                         FACILITY:  [facility]   PHYSICIAN:  Dr. Hadley Pen              DATE OF BIRTH:  10/17/1965   DATE OF ADMISSION:  08/19/2005  DATE OF DISCHARGE:                                HISTORY & PHYSICAL   A 45 year old African-American female patient. She has no family physician.   CHIEF COMPLAINT:  Chest pain for about 2-3 months.   HISTORY OF PRESENT ILLNESS:  This is a 45 year old black female patient who  came in today complaining of chest pain. This has been going on for about 3  months. Last month she went to [facility] and she had a chemical  test done. She was told that her heart was fine after the test. The patient  give me this history by herself. She said she was then referred to Dr.  Everardo All who is an endocrinologist, because her neck was swollen. She told me  that had ultrasound of the neck done which showed goiter. She was not given  any medications, she was not referred to any surgeon afterwards. Dr. Everardo All  told her to come back to see her in one year. She said the chest pain  __________ intermittent but there is no aggravating or relieving factors.  She denies any diaphoresis. No nausea, no vomiting, no diarrhea. She has a  past history of acid reflux. She is currently not on any PPI or H2 blockers.  She denies any abdominal pain. She denies any dysuria, no joint swelling, no  joint pain and no headaches. She denies any dizziness.   PAST MEDICAL HISTORY:  Significant for diabetes mellitus type 2,  hypertension, hypertriglyceridemia. She also recently has a history of  goiter.   SOCIAL HISTORY:  She smokes cigarettes 1/2 pack per day. She does not drink  alcohol, there is no history of drug abuse in this patient. She currently  does not work. She has applied for disability which is pending. She has a  son who is  44 and a daughter. She used to work in the past but after she got  sick she could not sustain a job.   FAMILY HISTORY:  She has a family history of __________  also has family  history of diabetes and hypertension in her mother. Her father has a history  of heart disease.   PAST SURGICAL HISTORY:  She has a history of  cholecystectomy and a history  of breast reduction.   ALLERGIES:  No known drug allergies.   HOME MEDICATIONS:  1.  Metformin 1000 mg daily.  2.  Cymbalta 80 mg twice daily.  3.  Lisinopril 20 mg daily.  4.  HCTZ 25 mg daily.  5.  Pravastatin 40 mg daily.  6.  Kay Ciel 20 mEq daily.  7.  Actos 30 mg daily.  8.  Metformin 1000 mg two times daily.   PHYSICAL EXAMINATION:  VITAL SIGNS:  Shows blood pressure  is 132/102 this  evening. Pulse rate is 85, respirations 20, temperature 97.0.  HEAD/NECK:  Shows pink conjunctiva. She has no jaundice. Neck is full  especially at the lower part of the neck above the shoulder. Her mucous  membrane is moist, tympanic membrane is clear.  CHEST:  __________  respirations, auscultation shows clear breath sounds.  CARDIOVASCULAR:  Shows normal heart sounds. No murmur, no gallop. Pulses are  palpable in the periphery.  ABDOMEN:  Soft,, no masses are palpable, no tenderness. The patient has  normal bowel sounds.  EXTREMITIES:  Shows no joint swelling, no feet edema. The patient has normal  range of motion in joints.  CENTRAL NERVOUS SYSTEM:  The patient is alert and oriented to time, place  and person. Power is 4/4 in all extremities. Her language is clear and  understandable. Her tongue is midline and cranial nerves also intact.   Initial blood work shows WBC 11.8 which is slightly elevated, H&H of 13 and  39 with platelets of 339. Urine, WBC 11-20 per high powered field which is  elevated. Urine nitrite is positive. Urine leukocyte esterase is also  positive suggesting a UTI. Chemistries show glucose of 337, sodium of 134,   potassium 4.3. BUN of 18, creatinine of 0.7, bicarb of 23 and chloride of  103. Calcium is normal 8.9. AST, ALT and total bilirubin are normal. TSH  level is __________ Coombs is less than 1which is also normal.   ASSESSMENT:  1.  Atypical chest pain.  2.  Uncontrolled diabetes mellitus type 2.  3.  History of hypertension.  4.  Goiter.  5.  Urinary tract infection.  6.  Obesity.  7.  History of high triglycerides.   PLAN:  Admit patient to medical floor. Her vital signs per routine, activity  as tolerated. I am going to do a sliding scale insulin protocol to control  her diabetes. She is going to have also TSH, T4 and T3 uptake done to assess  thyroid gland. I will also order CT scan of the neck to evaluate goiter. I  will continue her home medications:  1.  Metformin 1000 mg b.i.d.  2.  Cymbalta 30 mg b.i.d.  3.  Lisinopril 30 mg daily.  4.  HCTZ 25 mg daily.  5.  Kay Ciel 20 mEq daily.  6.  Actos 30 mg daily.   I am going to add IV Rocephin 1 gram q. daily x3 days for urinary tract  infection. Also IV Ativan 1 mg q.6 h p.r.n. for anxiety.           ______________________________  Dr. Marland Kitchen  D:  08/19/2005  T:  08/20/2005  Job:  [id number]

## 2010-07-21 NOTE — Discharge Summary (Signed)
Kelly Barber, Kelly Barber               ACCOUNT NO.:  [id number]   MEDICAL RECORD NO.:  [id number]          PATIENT TYPE:  INP   LOCATION:  1515                         FACILITY:  [facility]   PHYSICIAN:  Lonia Blood, M.D.      DATE OF BIRTH:  1965/08/11   DATE OF ADMISSION:  08/19/2005  DATE OF DISCHARGE:  08/20/2005                                 DISCHARGE SUMMARY   PRIMARY CARE PHYSICIAN:  Patient is unassigned and goes to [facility]  previously.  She will follow up with Dr. Mikeal Hawthorne subsequently.   DISCHARGE DIAGNOSES:  1.  Noncardiac chest pain, probably gastroesophageal reflux disease.  2.  Uncontrolled diabetes type 2, currently better control.  3.  Urinary tract infection.  4.  Substernal goiter with euthyroid depression.  5.  Hypertension.  6.  Morbid obesity.   DISCHARGE MEDICATIONS:  1.  Lantus insulin 10 units nightly subcutaneously.  2.  Actos 30 mg daily.  3.  Cymbalta 30 mg twice a day.  4.  Elavil 25 mg nightly.  5.  Pravastatin 40 mg daily.  6.  Lisinopril 10 mg daily or combination Zestril, Lisinopril,      hydrochlorothiazide 25 mg one tablet daily.  7.  Potassium chloride 20 mEq daily.  8.  Protonix 40 mg daily.  9.  Ciprofloxacin 250 mg twice a day x3 days.   DISPOSITION:  Patient insists on going home.  She is worried.  The patient  wants to go home because she has an autistic child and she is worried.  I  have told patient that that will be fine.  However, she is to take insulin  as well as Actos.  She is to follow up with me on Thursday afternoon at 3:30  p.m.  She has been instructed to check her sugars at least three times a day  and write them down before coming.  She is also going for counseling as an  outpatient.  She has already made that arrangement. She has had an eye test  that she is going for further work-up.  Otherwise, patient has been stable.   PROCEDURE PERFORMED THIS ADMISSION:  CT neck with contrast medium that was  performed today.  It showed  markedly enlarged thyroid gland with substernal  extension to the left, displacing the trachea to the right and narrowing  such.  The surrounded contour of the left isthmus projecting anteriorly  spanning over 2.4 cm , the right lobe of thyroid gland extends superiorly  and posteriorly causing indentation upon the right.  CT also shows right  lobe of the thyroid gland extending superiorly and posteriorly causing  indentation upon the right posterior aspect of the supraglottic region.  Ultrasound surveillance was recommended.  There is also slightly prominent  lymphoid tissue in the adenoidal region tonsillar pillars at the tongue base  and scattered top normal to slightly enlarged lymph nodes throughout the  neck and upper mediastinum as noted above.  This seemed to be reactive in  origin, although malignancy cannot be completely excluded.  There is right  maxillary sinus moderate mucosal thickening.  There is a recommendation for  ultrasound accordingly.  The previous ultrasound has showed slight  multinodular goiter with 8 mm degenerated cystic adenoma mid left lobe.   CONSULTATIONS:  None.   BRIEF HISTORY AND PHYSICAL:  Please refer to dictated history and physical.  In short, however, patient is a 45 year old female with known diabetes  presenting with chest pain.  Patient has baseline also history of GERD.  However, her initial work-up did not show any sign of cardiac chest pain.  On admission, she was found to have a CBG over 300, hence she was admitted  for further management of both diabetes and her thyroid.   HOSPITAL COURSE:  PROBLEM #1 -  CHEST PAIN:  It appears her chest pain is  due to the retrosternal goiter that is present on her trachea as indicated.  Patient will need some surveillance ultrasound as indicated but may also  require some surgical input.  At this point, her TSH is 0.6 indicating that  this is euthyroid.  I will ask for some surgical input and possibly  follow-  up.  Patient may ultimately require some surgery but probably not at this  point.   PROBLEM #2 -  URINARY TRACT INFECTION:  Patient with evidence of UTI.  She  has been on Reserpine initially.  I will put her on Cipro b.i.d.   PROBLEM #3 -  DIABETES:  Her sugars have continued to be high.  She has a CT  scan done today and wants off metformin.  I will move her onto subcutaneous  insulin plus Actos only.   PROBLEM #4 -  DEPRESSION:  Patient has gone through a lot of depression and  has been going to counseling prior to hospitalization.  She lost  her  insurance and could not go.  Will resume that.  Meanwhile, she has been  having some insomnia.  I will add some 25 mg of Elavil at night with the  Cymbalta she is taking.   PROBLEM #5 -  HYPERTENSION:  Blood pressure has been well controlled also  this admission.   PROBLEM #6 -  GASTROESOPHAGEAL REFLUX DISEASE:  I will continue with PPI  even after discharge.      Lonia Blood, M.D.  Electronically Signed     LG/MEDQ  D:  08/20/2005  T:  08/21/2005  Job:  [id number]

## 2010-07-21 NOTE — Op Note (Signed)
[facility]  Patient:    Kelly Barber, Kelly Barber                      MRN: [id number] Proc. Date: 10/04/99 Adm. Date:  [id number] Disc. Date: [id number] Attending:  Tammi Sou                           Operative Report  PREOPERATIVE DIAGNOSIS:       First trimester incomplete spontaneous abortion.  POSTOPERATIVE DIAGNOSIS:      First trimester incomplete spontaneous abortion.  OPERATION:                    Suction evacuation and curettage.  SURGEON:                      Charles A. Clearance Coots, M.D.  ASSISTANT:  ANESTHESIA:                   MAC anesthesia with paracervical block.  ESTIMATED BLOOD LOSS:         100 mL.  COMPLICATIONS:                None.  SPECIMENS:                    Products of conception.  DESCRIPTION OF PROCEDURE:     The patient was brought to the operating room and after satisfactory IV sedation, the legs were brought up in stirrups and the vagina was prepped and draped in the usual sterile fashion. The urinary bladder was emptied of approximately 20 cc of clear urine.  Bimanual examination revealed the uterus to be about six to eight weeks size, mid position.  Sterile speculum was inserted in the vaginal vault and the cervix was isolated. The cervix was opened and products of conception were observed in the cervical os.  These products of conception were removed with the sponge forceps and a #8 suction catheter was easily introduced into the uterine cavity and all contents were evacuated.  These products of conception were submitted to pathology for evaluation.  The endometrial surface was then curetted with a small sharp curette and no further products of conception were obtained. There was no active bleeding at the conclusion of the procedure. The uterus on examination.  All instruments were retired. The patient tolerated the procedure well and was transported to the recovery room in satisfactory condition. DD:   10/04/99 TD:  10/06/99 Job: [id number]

## 2010-07-21 NOTE — H&P (Signed)
Kelly Barber, Kelly Barber               ACCOUNT NO.:  [id number]   MEDICAL RECORD NO.:  [id number]          PATIENT TYPE:  AMB   LOCATION:  SDC                           FACILITY:  [facility]   PHYSICIAN:  Roseanna Rainbow, M.D.DATE OF BIRTH:  1965/07/13   DATE OF ADMISSION:  DATE OF DISCHARGE:                                HISTORY & PHYSICAL   CHIEF COMPLAINT:  The patient is a 45 year old female who desires a  sterilization procedure.   HISTORY OF PRESENT ILLNESS:  Please see the above.   ALLERGIES:  No known drug allergies.   MEDICATIONS:  Metformin, Lisinopril, hydrochlorothiazide, Klor-Con 20 mEq,  Cymbalta, Amitriptyline, Pravachol, and Lantus.   PAST MEDICAL HISTORY:  Diabetes, hypercholesterolemia, hypertension,  fibromyalgia.   PAST SURGICAL HISTORY:  Cholecystectomy, breast reduction, please see the  above.   SOCIAL HISTORY:  She is divorced, unemployed, currently smokes 1/2 pack per  day, has smoked for 10-15 years.  She does not give any significant history  of alcohol use, denies illicit drug use.   FAMILY HISTORY:  Adult onset diabetes mellitus, hypertension,  hyperthyroidism.   GYN HISTORY:  Possible history of uterine fibroids.   PAST GYN HISTORY:  She is status post a recent labial cyst excision.   PAST OBSTETRICAL HISTORY:  The patient has been pregnant three times.  She  has two living children.   PHYSICAL EXAMINATION:  VITAL SIGNS:  Temperature 97.1, pulse 115, blood pressure 144/93, weight 231  pounds, height 5 foot 7 inches.  GENERAL:  Overweight African American female in no apparent distress.  LUNGS:  Clear to auscultation bilaterally.  HEART:  Regular rate and rhythm.  ABDOMEN:  Soft, nontender, without masses.  PELVIC EXAM:  The right labia is healing, no induration.  The vagina is  clean. The cervix is without lesions.  On bimanual exam, the uterus is  anteverted, nontender, normal size, shape, and consistency.  The adnexa no  masses,  organomegaly, or guarding.   ASSESSMENT:  Multiparity who desires a sterilization procedure.   PLAN:  The planned procedure is a laparoscopic bilateral tubal ligation.  The risks, benefits, and alternative forms of management are reviewed with  the patient and informed consent has been obtained.      Roseanna Rainbow, M.D.  Electronically Signed     LAJ/MEDQ  D:  10/04/2005  T:  10/04/2005  Job:  [id number]

## 2010-07-21 NOTE — Op Note (Signed)
NAMEEMILIJA, Kelly Barber               ACCOUNT NO.:  [id number]   MEDICAL RECORD NO.:  [id number]          PATIENT TYPE:  AMB   LOCATION:  SDC                           FACILITY:  [facility]   PHYSICIAN:  Roseanna Rainbow, M.D.DATE OF BIRTH:  [birth year]   DATE OF PROCEDURE:  10/05/2005  DATE OF DISCHARGE:                                 OPERATIVE REPORT   PREOPERATIVE DIAGNOSIS:  Multiparity, desires sterilization procedure.   POSTOPERATIVE DIAGNOSIS:  Multiparity, desires sterilization procedure.   PROCEDURE:  Laparoscopic bilateral tubal ligation with fulguration.   SURGEON:  Roseanna Rainbow, M.D.   ANESTHESIA:  General endotracheal anesthesia.   COMPLICATIONS:  None.   ESTIMATED BLOOD LOSS:  Minimal.   IV FLUIDS:  As per anesthesiology.   URINE OUTPUT:  200 mL clear urine at the beginning of the procedure.   DESCRIPTION OF PROCEDURE:  The patient was taken to the operating room with  an IV running.  She was given general anesthesia and placed in the dorsal  lithotomy position and prepped and draped in the usual sterile fashion.  A  bivalve speculum was placed in the patient's vagina.  The anterior lip of  the cervix was grasped with a single-tooth tenaculum.  A Hulka manipulator  was then advanced into the uterus and then secured to the anterior lip of  the cervix as a means to manipulate the uterus.  The tenaculum and speculum  was then removed.  An inflow umbilical skin incision was then made with the  scalpel and carried down to the underlying fascia.  The fascia was tented up  and entered sharply.  The peritoneal incision was entered under direct  visualization.  The peritoneal incision was felt to be free of adhesions.  The Hasson trocar and sleeve were then advanced into the abdomen.  The  abdomen was then insufflated with CO2 gas.  A survey of the pelvic anatomy  was normal.  A 2 cm segment of the midisthmic portion of the left fallopian  tube was then cauterized.   With each application, the ohmmeter was noted to  go to 0.  The right fallopian tube was manipulated in a similar fashion.  All the instruments were removed from the abdomen.  The fascia was  reapproximated with 0 Vicryl.  The skin was reapproximated with 3-0 Vicryl  and  Dermabond.  The Hulka manipulator was then removed from the vagina with  minimal bleeding noted from the cervix.  At the close of the procedure, the  pack counts were said to be correct x2.  The patient was taken to the PACU  awake and in stable condition.      Roseanna Rainbow, M.D.  Electronically Signed     LAJ/MEDQ  D:  10/05/2005  T:  10/05/2005  Job:  [id number]

## 2010-10-11 ENCOUNTER — Emergency Department (HOSPITAL_COMMUNITY)
Admission: EM | Admit: 2010-10-11 | Discharge: 2010-10-12 | Disposition: A | Discharge status: Home / Self Care | Payer: PRIVATE HEALTH INSURANCE | Attending: Emergency Medicine | Admitting: Emergency Medicine

## 2010-10-11 DIAGNOSIS — I10 Essential (primary) hypertension: Secondary | ICD-10-CM | POA: Insufficient documentation

## 2010-10-11 DIAGNOSIS — E119 Type 2 diabetes mellitus without complications: Secondary | ICD-10-CM | POA: Insufficient documentation

## 2010-10-11 DIAGNOSIS — H109 Unspecified conjunctivitis: Secondary | ICD-10-CM | POA: Insufficient documentation

## 2010-10-11 DIAGNOSIS — F17200 Nicotine dependence, unspecified, uncomplicated: Secondary | ICD-10-CM | POA: Insufficient documentation

## 2010-10-11 LAB — GLUCOSE, CAPILLARY: Glucose-Capillary: 365 mg/dL — ABNORMAL HIGH (ref 70–99)

## 2010-11-28 LAB — GRAM STAIN

## 2010-11-28 LAB — CULTURE, ROUTINE-ABSCESS

## 2010-11-30 LAB — BASIC METABOLIC PANEL
BUN: 16
CO2: 25
Calcium: 8.9
Chloride: 101
Creatinine, Ser: 0.69
GFR calc Af Amer: >60
GFR calc non Af Amer: >60
Glucose, Bld: 324 — ABNORMAL HIGH
Potassium: 3.9
Sodium: 134 — ABNORMAL LOW

## 2010-11-30 LAB — CBC
HCT: 40.2
Hemoglobin: 13.5
MCHC: 33.6
MCV: 92
Platelets: 314
RBC: 4.37
RDW: 13.6
WBC: 10.9 — ABNORMAL HIGH

## 2010-11-30 LAB — DIFFERENTIAL
Basophils Absolute: 0.1
Basophils Relative: 1
Eosinophils Absolute: 0.2
Eosinophils Relative: 2
Lymphocytes Relative: 42
Lymphs Abs: 4.6 — ABNORMAL HIGH
Monocytes Absolute: 0.7
Monocytes Relative: 7
Neutro Abs: 5.2
Neutrophils Relative %: 48

## 2010-11-30 LAB — POCT CARDIAC MARKERS
CKMB, poc: 1.3
Myoglobin, poc: 29.3
Operator id: 133351
Troponin i, poc: <0.05

## 2010-12-04 LAB — DIFFERENTIAL
Basophils Absolute: 0
Basophils Relative: 0
Eosinophils Absolute: 0.2
Eosinophils Relative: 2
Lymphocytes Relative: 35
Lymphs Abs: 3.8
Monocytes Absolute: 0.6
Monocytes Relative: 6
Neutro Abs: 6.3
Neutrophils Relative %: 57

## 2010-12-04 LAB — URINALYSIS, ROUTINE W REFLEX MICROSCOPIC
Bilirubin Urine: NEGATIVE
Glucose, UA: >1000 — AB
Ketones, ur: NEGATIVE
Nitrite: NEGATIVE
Protein, ur: NEGATIVE
Specific Gravity, Urine: 1.037 — ABNORMAL HIGH
Urobilinogen, UA: 0.2
pH: 5.5

## 2010-12-04 LAB — CBC
HCT: 44.3
Hemoglobin: 14.6
MCHC: 33
MCV: 93.1
Platelets: 312
RBC: 4.76
RDW: 13.1
WBC: 11 — ABNORMAL HIGH

## 2010-12-04 LAB — BASIC METABOLIC PANEL
BUN: 14
CO2: 23
Calcium: 9
Chloride: 101
Creatinine, Ser: 0.66
GFR calc Af Amer: >60
GFR calc non Af Amer: >60
Glucose, Bld: 469 — ABNORMAL HIGH
Potassium: 4.1
Sodium: 132 — ABNORMAL LOW

## 2010-12-04 LAB — GLUCOSE, CAPILLARY
Glucose-Capillary: 346 — ABNORMAL HIGH
Glucose-Capillary: 420 — ABNORMAL HIGH

## 2010-12-04 LAB — GC/CHLAMYDIA PROBE AMP, GENITAL
Chlamydia, DNA Probe: NEGATIVE
GC Probe Amp, Genital: NEGATIVE

## 2010-12-04 LAB — URINE MICROSCOPIC-ADD ON

## 2010-12-04 LAB — WET PREP, GENITAL
WBC, Wet Prep HPF POC: NONE SEEN
Yeast Wet Prep HPF POC: NONE SEEN

## 2010-12-11 LAB — DIFFERENTIAL
Basophils Absolute: 0.1
Basophils Relative: 1
Eosinophils Absolute: 0.2
Eosinophils Relative: 1
Lymphocytes Relative: 31
Lymphs Abs: 3.6
Monocytes Absolute: 0.7
Monocytes Relative: 6
Neutro Abs: 7
Neutrophils Relative %: 61

## 2010-12-11 LAB — URINALYSIS, ROUTINE W REFLEX MICROSCOPIC
Bilirubin Urine: NEGATIVE
Glucose, UA: >1000 — AB
Ketones, ur: 40 — AB
Leukocytes, UA: NEGATIVE
Nitrite: POSITIVE — AB
Protein, ur: NEGATIVE
Specific Gravity, Urine: 1.02
Urobilinogen, UA: 0.2
pH: 5.5

## 2010-12-11 LAB — POCT CARDIAC MARKERS
CKMB, poc: 1.1
CKMB, poc: 1.1
Myoglobin, poc: 38.2
Myoglobin, poc: 45.4
Operator id: 294341
Operator id: 294341
Troponin i, poc: <0.05
Troponin i, poc: <0.05

## 2010-12-11 LAB — COMPREHENSIVE METABOLIC PANEL
ALT: 18
AST: 16
Albumin: 3.4 — ABNORMAL LOW
Alkaline Phosphatase: 88
BUN: 14
CO2: 22
Calcium: 8.8
Chloride: 104
Creatinine, Ser: 0.69
GFR calc Af Amer: >60
GFR calc non Af Amer: >60
Glucose, Bld: 304 — ABNORMAL HIGH
Potassium: 3.9
Sodium: 136
Total Bilirubin: 1.2
Total Protein: 6.7

## 2010-12-11 LAB — ETHANOL: Alcohol, Ethyl (B): <5

## 2010-12-11 LAB — RAPID URINE DRUG SCREEN, HOSP PERFORMED
Amphetamines: NOT DETECTED
Barbiturates: NOT DETECTED
Benzodiazepines: NOT DETECTED
Cocaine: NOT DETECTED
Opiates: NOT DETECTED
Tetrahydrocannabinol: NOT DETECTED

## 2010-12-11 LAB — URINE MICROSCOPIC-ADD ON

## 2010-12-11 LAB — CBC
HCT: 43.9
Hemoglobin: 14.8
MCHC: 33.6
MCV: 91.5
Platelets: 320
RBC: 4.8
RDW: 13.1
WBC: 11.6 — ABNORMAL HIGH

## 2011-07-16 ENCOUNTER — Telehealth: Payer: Self-pay

## 2011-07-16 NOTE — Telephone Encounter (Signed)
Phone call from pt./ reported she received an injury to her right foot, stating a client stepped on it about 3 weeks ago.  States she has a lot of pain in the right great toe and small toe.  Denies any swelling, redness or open sores.  States has seen her PCP, and is scheduled to f/u with him in a couple of weeks.  States "ecause of my history of left leg bypass and toe amputations on left foot, I don't think I should wait, or have my PCP treating this, and want Dr. Darrick Penna to evaluate it.  States she has pain in the right great toe and small toe, and along the outside of the right foot.  States the pain gets a lot worse when she walks on it.   Advised will discuss w/ Dr. Darrick Penna and schedule an appt.

## 2011-07-23 ENCOUNTER — Emergency Department (HOSPITAL_COMMUNITY)
Admission: EM | Admit: 2011-07-23 | Discharge: 2011-07-23 | Disposition: A | Discharge status: Home / Self Care | Payer: PRIVATE HEALTH INSURANCE | Attending: Emergency Medicine | Admitting: Emergency Medicine

## 2011-07-23 ENCOUNTER — Encounter (HOSPITAL_COMMUNITY): Payer: Self-pay | Admitting: Emergency Medicine

## 2011-07-23 DIAGNOSIS — Z21 Asymptomatic human immunodeficiency virus [HIV] infection status: Secondary | ICD-10-CM | POA: Insufficient documentation

## 2011-07-23 DIAGNOSIS — Z79899 Other long term (current) drug therapy: Secondary | ICD-10-CM | POA: Insufficient documentation

## 2011-07-23 DIAGNOSIS — M79609 Pain in unspecified limb: Secondary | ICD-10-CM | POA: Insufficient documentation

## 2011-07-23 DIAGNOSIS — M79676 Pain in unspecified toe(s): Secondary | ICD-10-CM

## 2011-07-23 DIAGNOSIS — I739 Peripheral vascular disease, unspecified: Secondary | ICD-10-CM | POA: Insufficient documentation

## 2011-07-23 DIAGNOSIS — I10 Essential (primary) hypertension: Secondary | ICD-10-CM | POA: Insufficient documentation

## 2011-07-23 DIAGNOSIS — Z794 Long term (current) use of insulin: Secondary | ICD-10-CM | POA: Insufficient documentation

## 2011-07-23 DIAGNOSIS — R209 Unspecified disturbances of skin sensation: Secondary | ICD-10-CM | POA: Insufficient documentation

## 2011-07-23 HISTORY — DX: Peripheral vascular disease, unspecified: I73.9

## 2011-07-23 HISTORY — DX: Essential (primary) hypertension: I10

## 2011-07-23 HISTORY — DX: Post-traumatic stress disorder, unspecified: F43.10

## 2011-07-23 HISTORY — DX: Immunodeficiency, unspecified: D84.9

## 2011-07-23 LAB — CBC
HCT: 42.8 % (ref 36.0–46.0)
Hemoglobin: 14.4 g/dL (ref 12.0–15.0)
MCH: 30.2 pg (ref 26.0–34.0)
MCHC: 33.6 g/dL (ref 30.0–36.0)
MCV: 89.7 fL (ref 78.0–100.0)
Platelets: 309 10*3/uL (ref 150–400)
RBC: 4.77 MIL/uL (ref 3.87–5.11)
RDW: 12.7 % (ref 11.5–15.5)
WBC: 7.6 10*3/uL (ref 4.0–10.5)

## 2011-07-23 LAB — LACTIC ACID, PLASMA: Lactic Acid, Venous: 1.5 mmol/L (ref 0.5–2.2)

## 2011-07-23 MED ORDER — HYDROCODONE-ACETAMINOPHEN 5-325 MG PO TABS: 1.0000 | ORAL_TABLET | Freq: Four times a day (QID) | ORAL | Status: AC | PRN

## 2011-07-23 MED ORDER — TRAMADOL HCL 50 MG PO TABS
50.0000 mg | ORAL_TABLET | Freq: Once | ORAL | Status: AC
  Administered 2011-07-23: 50 mg via ORAL
  Filled 2011-07-23: qty 1

## 2011-07-23 MED ORDER — HYDROCODONE-ACETAMINOPHEN 5-325 MG PO TABS
1.0000 | ORAL_TABLET | Freq: Once | ORAL | Status: AC
  Administered 2011-07-23: 1 via ORAL
  Filled 2011-07-23: qty 1

## 2011-07-23 NOTE — ED Provider Notes (Signed)
History     CSN: [id number]  Arrival date & time 07/23/11  2001   First MD Initiated Contact with Patient 07/23/11 2050      Chief Complaint  Patient presents with  . Toe Pain    HPI The patient presents with concerns over pain in her right first and fifth toes.  She has a notable history of insulin-dependent diabetes, and her left great toe was amputated previously.  She notes over the past week she has had increasing pain in each of the aforementioned digits.  The pain is achy, burning, or relieved with massage, worse with exertion.  She denies ongoing proximal pain, erythema, dyspnea, chest pain, fevers, chills.  She was seen at an urgent care clinic and referred here for further evaluation. Past Medical History  Diagnosis Date  . Hypertension   . Immune deficiency disorder   . PVD (peripheral vascular disease)   . PTSD (post-traumatic stress disorder)     Past Surgical History  Procedure Date  . Amputation   . Femoral bypass     No family history on file.  History  Substance Use Topics  . Smoking status: Never Smoker   . Smokeless tobacco: Not on file  . Alcohol Use: No    OB History    Grav Para Term Preterm Abortions TAB SAB Ect Mult Living                  Review of Systems  Constitutional:       HPI  HENT:       HPI otherwise negative  Eyes: Negative.   Respiratory:       HPI, otherwise negative  Cardiovascular:       HPI, otherwise nmegative  Gastrointestinal: Negative for vomiting.  Genitourinary:       HPI, otherwise negative  Musculoskeletal:       HPI, otherwise negative  Skin: Negative.   Neurological: Negative for syncope.    Allergies  Amoxicillin-pot clavulanate and Codeine  Home Medications   Current Outpatient Rx  Name Route Sig Dispense Refill  . ALPRAZOLAM 2 MG PO TABS Oral Take 2 mg by mouth 2 (two) times daily as needed. For anxiety    . BUPROPION HCL ER (XL) 300 MG PO TB24 Oral Take 300 mg by mouth daily.    . INSULIN  GLARGINE 100 UNIT/ML [place] SOLN Subcutaneous Inject 60 Units into the skin at bedtime.    . INSULIN REGULAR HUMAN 100 UNIT/ML IJ SOLN Subcutaneous Inject 10-15 Units into the skin 3 (three) times daily before meals. Pt is on a sliding scale    . VALSARTAN-HYDROCHLOROTHIAZIDE 160-25 MG PO TABS Oral Take 0.5 tablets by mouth daily.      BP 152/96  Pulse 94  Temp(Src) 99.2 F (37.3 C) (Oral)  Resp 19  SpO2 98%  LMP 06/11/2011  Physical Exam  Nursing note and vitals reviewed. Constitutional: She appears well-developed and well-nourished.  HENT:  Head: Normocephalic and atraumatic.  Eyes: Conjunctivae and EOM are normal.  Cardiovascular: Normal rate and regular rhythm.   Pulmonary/Chest: Effort normal and breath sounds normal. No stridor.  Abdominal: Soft.  Musculoskeletal:       Feet:       Pulses are palpable in each foot on the dorsalis pedis.  The patient has good cap refill throughout all remaining digits.  Patient has good sensation throughout all digits  Skin: No rash noted. There is erythema.    ED Course  Procedures (including critical  care time)   Labs Reviewed  CBC  LACTIC ACID, PLASMA   No results found.   No diagnosis found.    MDM  This patient with insulin-dependent diabetes, prior left great toe amputation now presents with concerns over to painful digits on his right foot.  On exam the patient is in no distress.  The affected digits are both warm, with appropriate cap refill, sensation, motor status.  The patient has palpable pulses in the foot.  Given the absence of fever, leukocytosis, lactic acidosis, the patient was discharged to follow up with her vascular surgeon for further evaluation and management of her peripheral vascular disease.   Gerhard Munch, MD 07/23/11 2342

## 2011-07-23 NOTE — ED Notes (Signed)
PT. REPORTS PAIN AT RIGHT BIG AND 5TH TOE FOR SEVERAL DAYS WITH DISCOLORATION , CONCERNED ABOUT HISTORY OF PVD. FAINT PEDAL PULSES/ COOL TO TOUCH.

## 2011-07-23 NOTE — Discharge Instructions (Signed)
It is very important that you follow up with Dr. Darrick Penna.  Please return to the ED for any concerning changes in your condition.

## 2011-07-24 ENCOUNTER — Other Ambulatory Visit: Payer: Self-pay

## 2011-07-24 DIAGNOSIS — I739 Peripheral vascular disease, unspecified: Secondary | ICD-10-CM

## 2011-07-25 ENCOUNTER — Encounter: Payer: Self-pay | Admitting: Vascular Surgery

## 2011-07-26 ENCOUNTER — Ambulatory Visit (INDEPENDENT_AMBULATORY_CARE_PROVIDER_SITE_OTHER): Payer: PRIVATE HEALTH INSURANCE | Admitting: Vascular Surgery

## 2011-07-26 ENCOUNTER — Ambulatory Visit: Payer: PRIVATE HEALTH INSURANCE | Admitting: Vascular Surgery

## 2011-07-26 ENCOUNTER — Encounter (INDEPENDENT_AMBULATORY_CARE_PROVIDER_SITE_OTHER): Payer: PRIVATE HEALTH INSURANCE | Admitting: *Deleted

## 2011-07-26 ENCOUNTER — Encounter: Payer: Self-pay | Admitting: Vascular Surgery

## 2011-07-26 ENCOUNTER — Other Ambulatory Visit: Payer: Self-pay | Admitting: *Deleted

## 2011-07-26 VITALS — BP 164/92 | HR 83 | Temp 98.2°F | Ht 68.0 in | Wt 231.5 lb

## 2011-07-26 DIAGNOSIS — I739 Peripheral vascular disease, unspecified: Secondary | ICD-10-CM

## 2011-07-26 DIAGNOSIS — Z48812 Encounter for surgical aftercare following surgery on the circulatory system: Secondary | ICD-10-CM

## 2011-07-26 DIAGNOSIS — I70219 Atherosclerosis of native arteries of extremities with intermittent claudication, unspecified extremity: Secondary | ICD-10-CM | POA: Insufficient documentation

## 2011-07-26 NOTE — Progress Notes (Signed)
Patient is a 60 her old female who was referred today for darkish discoloration of her right fifth toe. She previously underwent left femoral endarterectomy with left femoral to above-knee popliteal bypass with vein and subsequent amputation of toes 12 and 3 on the left foot. This was in 2010. She states that she has had some trauma to the right foot recently something dropped on her foot. She has had increasing pain in the right fifth toe. She denies any claudication symptoms. Chronic medical problems include diabetes hypertension dyslipidemia anxiety disorder depression. These are all currently controlled.  Past Medical History  Diagnosis Date  . Hypertension   . Immune deficiency disorder   . PVD (peripheral vascular disease)   . PTSD (post-traumatic stress disorder)   . Diabetes mellitus   . Hyperlipidemia   . Depression, major   . Anxiety disorder   . Anemia    Past Surgical History  Procedure Date  . Amputation   . Femoral bypass 04/29/2008    left  . Toe amputation 07/12/2008     left foot toes 1,2,3  . Aortogram   . Cholecystectomy 1990   History   Social History  . Marital Status: Divorced    Spouse Name: N/A    Number of Children: N/A  . Years of Education: N/A   Occupational History  . Not on file.   Social History Main Topics  . Smoking status: Former Smoker    Types: Cigarettes    Quit date: 07/25/2008  . Smokeless tobacco: Never Used  . Alcohol Use: No  . Drug Use: No  . Sexually Active: Not on file   Other Topics Concern  . Not on file   Social History Narrative  . No narrative on file   Current Outpatient Prescriptions on File Prior to Visit  Medication Sig Dispense Refill  . buPROPion (WELLBUTRIN XL) 300 MG 24 hr tablet Take 300 mg by mouth daily.      Marland Kitchen HYDROcodone-acetaminophen (NORCO) 5-325 MG per tablet Take 1 tablet by mouth every 6 (six) hours as needed for pain.  15 tablet  0  . insulin glargine (LANTUS) 100 UNIT/ML injection Inject 60 Units  into the skin at bedtime.      . insulin regular (NOVOLIN R,HUMULIN R) 100 units/mL injection Inject 10-15 Units into the skin 3 (three) times daily before meals. Pt is on a sliding scale      . valsartan-hydrochlorothiazide (DIOVAN-HCT) 160-25 MG per tablet Take 0.5 tablets by mouth daily.      Marland Kitchen alprazolam (XANAX) 2 MG tablet Take 2 mg by mouth 2 (two) times daily as needed. For anxiety       Allergies  Allergen Reactions  . Amoxicillin-Pot Clavulanate   . Codeine     REACTION: swelling    Review of systems: Cardiac denies chest pain has not seen a cardiologist in several years, respiratory denies shortness of breath, endocrine states her diabetes is fairly well controlled  Physical exam: Blood pressure 164/92, pulse 83, temperature 98.2 F (36.8 C), temperature source Oral, height 5\' 8"  (1.727 m), weight 231 lb 7.7 oz (105 kg), last menstrual period 06/11/2011.  Neck 2+ carotid pulses without bruit  Chest clear to auscultation bilaterally  Cardiac: Regular rate and rhythm without murmur  Abdomen: Soft nontender nondistended obese Extremities: 2+ femoral pulse left leg absent popliteal and pedal pulses, 2+ right femoral pulse absent popliteal and pedal pulses  Skin: Right fifth toe darkish discoloration consistent with either ischemia or ecchymosis  Data: She had a graft duplex on the left side which I reviewed and interpreted. This showed no significant stenosis with an ABI of 0.99 ABI on the right side was 0.56 with a mid SFA occlusion  Assessment: Ischemia right foot with peripheral arterial disease  Plan: Aortogram with bilateral lower extremity runoff possible intervention on the right leg we'll do a left groin puncture risks benefits possible palpitations and procedure details explained the patient today this is scheduled for 08/10/2011. If her symptoms worsen prior to that we will consider moving up her arteriogram  Kelly Bruns, MD [facility]  [place] Office: [phone] Pager: [phone]

## 2011-07-27 ENCOUNTER — Telehealth: Payer: Self-pay

## 2011-07-27 DIAGNOSIS — M79604 Pain in right leg: Secondary | ICD-10-CM

## 2011-07-27 MED ORDER — HYDROCODONE-ACETAMINOPHEN 5-500 MG PO TABS: 1.0000 | ORAL_TABLET | Freq: Four times a day (QID) | ORAL | Status: AC | PRN

## 2011-07-27 NOTE — Telephone Encounter (Signed)
Pt. Called stating she did not ask for pain medication yesterday, when she saw Dr. Darrick Penna, but is about out of Hydrocodone/Acetaminophen.  States that her pain level in her (R) great toe and small toe, is 8/10.  States her pain in the small toe is "throbbing".  Pt's angiogram is scheduled for 08/10/11.  Discussed w/ Gatha Mayer, NP. Rec'd v.o. for Hydrocodone/Acetaminophen per s.o. Pt. Advised will call RX in to pharmacy.

## 2011-08-03 NOTE — Procedures (Unsigned)
BYPASS GRAFT EVALUATION  INDICATION:  Pain and discoloration of right lower extremity digits subsequent to  injury.  Left fem-pop graft placed 04/29/2008.  HISTORY: Diabetes:  Yes. Cardiac:  No. Hypertension:  Yes. Smoking:  Previous. Previous Surgery:  Left fem-pop graft.  SINGLE LEVEL ARTERIAL EXAM                              RIGHT              LEFT Brachial: Anterior tibial: Posterior tibial: Peroneal: Ankle/brachial index:        0.56               0.99  PREVIOUS ABI:  Date: None  RIGHT:  LEFT:  LOWER EXTREMITY BYPASS GRAFT DUPLEX EXAM:  DUPLEX: 1. Occlusion of mid SFA on right over a length of approximately 10 cm.     Collaterals are observed proximal and distal to the occlusion. 2. Widely patent left fem-pop graft without evidence of re-stenosis or     hyperplasia.  IMPRESSION: 1. Occluded right superficial femoral artery as described above. 2. Widely patent left femoral-popliteal graft as described above.  ___________________________________________ Janetta Hora. Fields, MD  LT/MEDQ  D:  07/26/2011  T:  07/27/2011  Job:  [id number]

## 2011-08-09 MED ORDER — SODIUM CHLORIDE 0.9 % IV SOLN
INTRAVENOUS | Status: DC
  Administered 2011-08-10 (×2): via INTRAVENOUS

## 2011-08-10 ENCOUNTER — Other Ambulatory Visit: Payer: Self-pay | Admitting: *Deleted

## 2011-08-10 ENCOUNTER — Encounter (HOSPITAL_COMMUNITY)
Admission: RE | Disposition: A | Discharge status: Home / Self Care | Payer: Self-pay | Source: Ambulatory Visit | Attending: Vascular Surgery

## 2011-08-10 ENCOUNTER — Telehealth: Payer: Self-pay | Admitting: Vascular Surgery

## 2011-08-10 ENCOUNTER — Ambulatory Visit (HOSPITAL_COMMUNITY)
Admission: RE | Admit: 2011-08-10 | Discharge: 2011-08-10 | Disposition: A | Discharge status: Home / Self Care | Payer: PRIVATE HEALTH INSURANCE | Source: Ambulatory Visit | Attending: Vascular Surgery | Admitting: Vascular Surgery

## 2011-08-10 ENCOUNTER — Encounter (HOSPITAL_COMMUNITY): Payer: Self-pay | Admitting: *Deleted

## 2011-08-10 DIAGNOSIS — E119 Type 2 diabetes mellitus without complications: Secondary | ICD-10-CM | POA: Insufficient documentation

## 2011-08-10 DIAGNOSIS — L98499 Non-pressure chronic ulcer of skin of other sites with unspecified severity: Secondary | ICD-10-CM

## 2011-08-10 DIAGNOSIS — I10 Essential (primary) hypertension: Secondary | ICD-10-CM | POA: Insufficient documentation

## 2011-08-10 DIAGNOSIS — I70229 Atherosclerosis of native arteries of extremities with rest pain, unspecified extremity: Secondary | ICD-10-CM

## 2011-08-10 DIAGNOSIS — I739 Peripheral vascular disease, unspecified: Secondary | ICD-10-CM

## 2011-08-10 DIAGNOSIS — E785 Hyperlipidemia, unspecified: Secondary | ICD-10-CM | POA: Insufficient documentation

## 2011-08-10 DIAGNOSIS — Z01818 Encounter for other preprocedural examination: Secondary | ICD-10-CM

## 2011-08-10 HISTORY — PX: ABDOMINAL AORTAGRAM: SHX5454

## 2011-08-10 HISTORY — PX: LOWER EXTREMITY ANGIOGRAM: SHX5508

## 2011-08-10 LAB — PREGNANCY, URINE: Preg Test, Ur: NEGATIVE

## 2011-08-10 LAB — POCT I-STAT, CHEM 8
BUN: 27 mg/dL — ABNORMAL HIGH (ref 6–23)
Calcium, Ion: 1.23 mmol/L (ref 1.12–1.32)
Chloride: 101 mEq/L (ref 96–112)
Creatinine, Ser: 0.9 mg/dL (ref 0.50–1.10)
Glucose, Bld: 365 mg/dL — ABNORMAL HIGH (ref 70–99)
HCT: 45 % (ref 36.0–46.0)
Hemoglobin: 15.3 g/dL — ABNORMAL HIGH (ref 12.0–15.0)
Potassium: 4.3 mEq/L (ref 3.5–5.1)
Sodium: 134 mEq/L — ABNORMAL LOW (ref 135–145)
TCO2: 23 mmol/L (ref 0–100)

## 2011-08-10 LAB — GLUCOSE, CAPILLARY: Glucose-Capillary: 281 mg/dL — ABNORMAL HIGH (ref 70–99)

## 2011-08-10 SURGERY — ABDOMINAL AORTAGRAM
Anesthesia: LOCAL

## 2011-08-10 MED ORDER — OXYCODONE HCL 5 MG PO TABS: 5.0000 mg | ORAL_TABLET | ORAL | Status: DC | PRN

## 2011-08-10 MED ORDER — LIDOCAINE HCL (PF) 1 % IJ SOLN
INTRAMUSCULAR | Status: AC
  Filled 2011-08-10: qty 30

## 2011-08-10 MED ORDER — MORPHINE SULFATE 10 MG/ML IJ SOLN
2.0000 mg | INTRAMUSCULAR | Status: DC | PRN
  Administered 2011-08-10: 2 mg via INTRAVENOUS

## 2011-08-10 MED ORDER — ACETAMINOPHEN 325 MG PO TABS: 325.0000 mg | ORAL_TABLET | ORAL | Status: DC | PRN

## 2011-08-10 MED ORDER — MORPHINE SULFATE 4 MG/ML IJ SOLN
INTRAMUSCULAR | Status: AC
  Filled 2011-08-10: qty 1

## 2011-08-10 MED ORDER — INSULIN ASPART 100 UNIT/ML ~~LOC~~ SOLN
10.0000 [IU] | Freq: Once | SUBCUTANEOUS | Status: AC
  Administered 2011-08-10: 10 [IU] via SUBCUTANEOUS

## 2011-08-10 MED ORDER — ACETAMINOPHEN 325 MG PO TABS
ORAL_TABLET | ORAL | Status: AC
  Administered 2011-08-10: 650 mg
  Filled 2011-08-10: qty 2

## 2011-08-10 MED ORDER — HEPARIN (PORCINE) IN NACL 2-0.9 UNIT/ML-% IJ SOLN
INTRAMUSCULAR | Status: AC
  Filled 2011-08-10: qty 1000

## 2011-08-10 MED ORDER — ONDANSETRON HCL 4 MG/2ML IJ SOLN: 4.0000 mg | Freq: Four times a day (QID) | INTRAMUSCULAR | Status: DC | PRN

## 2011-08-10 MED ORDER — METOPROLOL TARTRATE 1 MG/ML IV SOLN: 2.0000 mg | INTRAVENOUS | Status: DC | PRN

## 2011-08-10 MED ORDER — SODIUM CHLORIDE 0.45 % IV SOLN: INTRAVENOUS | Status: DC

## 2011-08-10 MED ORDER — LABETALOL HCL 5 MG/ML IV SOLN: 10.0000 mg | INTRAVENOUS | Status: DC | PRN

## 2011-08-10 MED ORDER — HYDRALAZINE HCL 20 MG/ML IJ SOLN: 10.0000 mg | INTRAMUSCULAR | Status: DC | PRN

## 2011-08-10 MED ORDER — INSULIN ASPART 100 UNIT/ML ~~LOC~~ SOLN: 0.0000 [IU] | Freq: Three times a day (TID) | SUBCUTANEOUS | Status: DC

## 2011-08-10 MED ORDER — ACETAMINOPHEN 325 MG RE SUPP: 325.0000 mg | RECTAL | Status: DC | PRN

## 2011-08-10 NOTE — Discharge Instructions (Signed)

## 2011-08-10 NOTE — Op Note (Signed)
Procedure: Aortogram with bilateral lower extremity runoff  Preoperative diagnosis: Rest pain right foot  Postoperative diagnosis: Same  Anesthesia local  Operative findings  : 1.  Patent left femoral to above knee popliteal bypass                       2. Right SFA short occlusion with patent above knee popliteal and 2 vessel runoff AT peroneal            3.  No significant aortoiliac stenosis  Operative details: After obtaining informed consent, the patient was taken to the PV LAB. The patient was placed in supine position on the Angio table. Both groins were prepped and draped in usual sterile fashion. Local anesthesia was infiltrated over the left common femoral artery. Ultrasound was used to identify the left common femoral artery.  An introducer needle was used to cannulate the left common femoral artery and 035 versacore wire threaded into the abdominal aorta under fluoroscopic guidance. Next a 5 French sheath is placed over the guidewire and the left common femoral artery. However, due to scar in the groin this would not pass easily.  Therefore, a 5 Fr straight catheter was placed over the guidewire and the Versacore wire exchanged for [employer] wire.  The dilator for the sheath was then placed over the guidewire into the femoral artery wild mild resistance.  The dilator and sheath were then passed over the wire without resistance.  A 5 French pigtail catheter was placed over the guidewire into the abdominal aorta and abdominal aortogram was obtained. The infrarenal abdominal aorta is patent. The left and right renal arteries are patent. The left and right common internal and external iliac arteries are patent. The right common iliac artery has a 30% stenosis.  The catheter was then pulled up just above the aortic bifurcation and pelvic angiogram was obtained. This also shows the left and right common femoral arteries are patent. Next a stepwise angiogram was performed of the lower extremities.  This showed patent common femoral arteries bilaterally.   In the left lower extremity there is a left femoral to above-knee popliteal bypass which is patent. The left common femoral and profunda femoris arteries are patent. The native left superficial femoral artery is occluded. There is 3 vessel runoff to the left foot.   In the right lower extremity the right common femoral and profunda femoris arteries are patent. The right superficial femoral artery is occluded in the mid leg. It then reconstitutes the above-knee popliteal artery via profunda and superficial femoral collaterals. The popliteal artery on the right leg is patent. There is two-vessel runoff to the right foot via the anterior tibial and peroneal arteries.   Next, the pigtail catheter was removed over a guidewire.  Oblique views of the left groin were obtained which showed no significant narrowing of the proximal anastomosis of the left femoropop.   The pigtail was exchanged for a 5 [place] crossover catheter. The crossover catheter was used to selectively catheterize the right common iliac artery and the guidewire advanced into the right distal external iliac artery. The crossover catheter was removed and replaced with a 5 French straight catheter. Angiogram was then performed the right lower extremity to provide extra views of the tibial arteries. The popliteal artery is patent. The anterior tibial and peroneal arteries are patent.    Next the 5 French straight catheter was removed over a guidewire. The 5 French sheath was left in place to  be pulled in the holding area. The patient tolerated the procedure well and there were no complications. Patient was taken to the holding area in stable condition.  Operative management: The patient will be scheduled for cardiac risk stratification by [facility] cardiology next week. She will then be scheduled for a right femoral to above-knee popliteal bypass the following week.  [name], MD [facility] [place] Office: [phone] Pager: [phone]

## 2011-08-10 NOTE — Telephone Encounter (Addendum)
Message copied by Rosalyn Charters on Fri Aug 10, 2011  1:08 PM ------      Message from: Melene Plan      Created: Fri Aug 10, 2011  9:59 AM       PLEASE SET HER UP FOR CARDIAC CLEARANCE NEXT WEEK AT [facility] FOR BYPASS SURGERY THE NEXT WEEK OF 17TH. SHE WAS AN OLD PT OF DR Cordella Register notified patient of appointment for cardiac clearance with dr. Eden Emms on August 16, 2011 at 12:15 pm

## 2011-08-10 NOTE — H&P (View-Only) (Signed)
Patient is a 45 her old female who was referred today for darkish discoloration of her right fifth toe. She previously underwent left femoral endarterectomy with left femoral to above-knee popliteal bypass with vein and subsequent amputation of toes 12 and 3 on the left foot. This was in 2010. She states that she has had some trauma to the right foot recently something dropped on her foot. She has had increasing pain in the right fifth toe. She denies any claudication symptoms. Chronic medical problems include diabetes hypertension dyslipidemia anxiety disorder depression. These are all currently controlled.  Past Medical History  Diagnosis Date  . Hypertension   . Immune deficiency disorder   . PVD (peripheral vascular disease)   . PTSD (post-traumatic stress disorder)   . Diabetes mellitus   . Hyperlipidemia   . Depression, major   . Anxiety disorder   . Anemia    Past Surgical History  Procedure Date  . Amputation   . Femoral bypass 04/29/2008    left  . Toe amputation 07/12/2008     left foot toes 1,2,3  . Aortogram   . Cholecystectomy 1990   History   Social History  . Marital Status: Divorced    Spouse Name: N/A    Number of Children: N/A  . Years of Education: N/A   Occupational History  . Not on file.   Social History Main Topics  . Smoking status: Former Smoker    Types: Cigarettes    Quit date: 07/25/2008  . Smokeless tobacco: Never Used  . Alcohol Use: No  . Drug Use: No  . Sexually Active: Not on file   Other Topics Concern  . Not on file   Social History Narrative  . No narrative on file   Current Outpatient Prescriptions on File Prior to Visit  Medication Sig Dispense Refill  . buPROPion (WELLBUTRIN XL) 300 MG 24 hr tablet Take 300 mg by mouth daily.      . HYDROcodone-acetaminophen (NORCO) 5-325 MG per tablet Take 1 tablet by mouth every 6 (six) hours as needed for pain.  15 tablet  0  . insulin glargine (LANTUS) 100 UNIT/ML injection Inject 60 Units  into the skin at bedtime.      . insulin regular (NOVOLIN R,HUMULIN R) 100 units/mL injection Inject 10-15 Units into the skin 3 (three) times daily before meals. Pt is on a sliding scale      . valsartan-hydrochlorothiazide (DIOVAN-HCT) 160-25 MG per tablet Take 0.5 tablets by mouth daily.      . alprazolam (XANAX) 2 MG tablet Take 2 mg by mouth 2 (two) times daily as needed. For anxiety       Allergies  Allergen Reactions  . Amoxicillin-Pot Clavulanate   . Codeine     REACTION: swelling    Review of systems: Cardiac denies chest pain has not seen a cardiologist in several years, respiratory denies shortness of breath, endocrine states her diabetes is fairly well controlled  Physical exam: Blood pressure 164/92, pulse 83, temperature 98.2 F (36.8 C), temperature source Oral, height 5' 8" (1.727 m), weight 231 lb 7.7 oz (105 kg), last menstrual period 06/11/2011.  Neck 2+ carotid pulses without bruit  Chest clear to auscultation bilaterally  Cardiac: Regular rate and rhythm without murmur  Abdomen: Soft nontender nondistended obese Extremities: 2+ femoral pulse left leg absent popliteal and pedal pulses, 2+ right femoral pulse absent popliteal and pedal pulses  Skin: Right fifth toe darkish discoloration consistent with either ischemia or ecchymosis    Data: She had a graft duplex on the left side which I reviewed and interpreted. This showed no significant stenosis with an ABI of 0.99 ABI on the right side was 0.56 with a mid SFA occlusion  Assessment: Ischemia right foot with peripheral arterial disease  Plan: Aortogram with bilateral lower extremity runoff possible intervention on the right leg we'll do a left groin puncture risks benefits possible palpitations and procedure details explained the patient today this is scheduled for 08/10/2011. If her symptoms worsen prior to that we will consider moving up her arteriogram  Zori Benbrook, MD [facility]  [place] Office: [phone] Pager: [phone]   

## 2011-08-10 NOTE — Interval H&P Note (Signed)
History and Physical Interval Note:  08/10/2011 7:32 AM  Kelly Barber  has presented today for surgery, with the diagnosis of PVD  The various methods of treatment have been discussed with the patient and family. After consideration of risks, benefits and other options for treatment, the patient has consented to  Procedure(s) (LRB): ABDOMINAL AORTAGRAM (N/A) LOWER EXTREMITY ANGIOGRAM (N/A) as a surgical intervention .  The patients' history has been reviewed, patient examined, no change in status, stable for surgery.  I have reviewed the patients' chart and labs.  Questions were answered to the patient's satisfaction.     [name]

## 2011-08-15 ENCOUNTER — Encounter: Payer: Self-pay | Admitting: Cardiovascular Disease

## 2011-08-16 ENCOUNTER — Encounter: Payer: Self-pay | Admitting: Cardiovascular Disease

## 2011-08-16 ENCOUNTER — Telehealth: Payer: Self-pay | Admitting: *Deleted

## 2011-08-16 ENCOUNTER — Ambulatory Visit (INDEPENDENT_AMBULATORY_CARE_PROVIDER_SITE_OTHER): Payer: PRIVATE HEALTH INSURANCE | Admitting: Cardiovascular Disease

## 2011-08-16 VITALS — BP 138/84 | HR 89 | Wt 237.0 lb

## 2011-08-16 DIAGNOSIS — E119 Type 2 diabetes mellitus without complications: Secondary | ICD-10-CM

## 2011-08-16 DIAGNOSIS — Z01810 Encounter for preprocedural cardiovascular examination: Secondary | ICD-10-CM

## 2011-08-16 DIAGNOSIS — I10 Essential (primary) hypertension: Secondary | ICD-10-CM

## 2011-08-16 DIAGNOSIS — I70219 Atherosclerosis of native arteries of extremities with intermittent claudication, unspecified extremity: Secondary | ICD-10-CM

## 2011-08-16 DIAGNOSIS — I739 Peripheral vascular disease, unspecified: Secondary | ICD-10-CM

## 2011-08-16 DIAGNOSIS — Z01818 Encounter for other preprocedural examination: Secondary | ICD-10-CM

## 2011-08-16 NOTE — Patient Instructions (Signed)
Your physician recommends that you schedule a follow-up appointment in: AS NEEDED  

## 2011-08-16 NOTE — Progress Notes (Signed)
Patient ID: Kelly Barber, female   DOB: [birth year], 46 y.o.   MRN: [id number] 46 yo patient of Dr Teressa Lower added on to my schedule as DOD for preop clearence.  IDDM, previous smoker quit 3 years ago with known vascular disease.  Had left fempop in 2010.  Adenosine myovue t2007  normal with EF 57%  Patient had horrible experience with test.  Got claustrophobic and felt like she was going to die.  Does not want to have another one.  EF 57%.  Quit smoking 3 years ago.  No chest pain or documented CAD.  No complications from previous surgery.  ABI on right is .56 with limiting claudication and concern for wound healing.  Compliant with meds  No dyspnea, palpitations or syncope.  Had first 3 toes on left foot amputated  And 5th toe on right recently injured and slow to heal  ROS: Denies fever, malais, weight loss, blurry vision, decreased visual acuity, cough, sputum, SOB, hemoptysis, pleuritic pain, palpitaitons, heartburn, abdominal pain, melena, lower extremity edema, claudication, or rash.  All other systems reviewed and negative   General: Affect appropriate Obese black female HEENT: normal Neck supple with no adenopathy JVP normal no bruits no thyromegaly Lungs clear with no wheezing and good diaphragmatic motion Heart:  S1/S2 no murmur,rub, gallop or click PMI normal Abdomen: benighn, BS positve, no tenderness, no AAA no bruit.  No HSM or HJR Poor pulses below knees.  S/P left fem pop with amputation of 3 toes on left foot  Chronic iscemia right foot With discoloration of 5th toe No edema Neuro non-focal Skin warm and dry No muscular weakness  Medications Current Outpatient Prescriptions  Medication Sig Dispense Refill  . ALPRAZolam (XANAX) 1 MG tablet Take 1/2 tablet tid      . alprazolam (XANAX) 2 MG tablet Take 2 mg by mouth 2 (two) times daily as needed. For anxiety      . buPROPion (WELLBUTRIN XL) 300 MG 24 hr tablet Take 300 mg by mouth daily.      . insulin glargine  (LANTUS) 100 UNIT/ML injection Inject 60 Units into the skin at bedtime.      . insulin regular (NOVOLIN R,HUMULIN R) 100 units/mL injection Inject 10-15 Units into the skin 3 (three) times daily before meals. Pt is on a sliding scale      . valsartan-hydrochlorothiazide (DIOVAN-HCT) 160-25 MG per tablet Take 0.5 tablets by mouth daily.        Allergies Amoxicillin-pot clavulanate and Codeine  Family History: Family History  Problem Relation Age of Onset  . Diabetes Mother   . Hyperlipidemia Mother   . Hypertension Mother   . Diabetes Father   . Heart disease Father     before age 41  . Hypertension Father   . Hyperlipidemia Father   . Hypertension Sister   . Diabetes Brother     Social History: History   Social History  . Marital Status: Divorced    Spouse Name: N/A    Number of Children: N/A  . Years of Education: N/A   Occupational History  . Not on file.   Social History Main Topics  . Smoking status: Former Smoker    Types: Cigarettes    Quit date: 07/25/2008  . Smokeless tobacco: Never Used  . Alcohol Use: No  . Drug Use: No  . Sexually Active: Not on file   Other Topics Concern  . Not on file   Social History Narrative  . No narrative  on file    Electrocardiogram: NSR rate 88 LAD poor R wave progression from body habitus done 08/13/11  Assessment and Plan

## 2011-08-16 NOTE — Assessment & Plan Note (Signed)
Left fem pop widely patent on recent duplex.  Hopefully right sided surgery will be as successful.  Try to avoid 5th toe amputation

## 2011-08-16 NOTE — Assessment & Plan Note (Signed)
Well controlled.  Continue current medications and low sodium Dash type diet.    

## 2011-08-16 NOTE — Telephone Encounter (Signed)
Patient called requesting refill on Vicodin, surgery not scheduled until 08/27/11, Dr Darrick Penna said to give refill. Called [facility] [phone] Vicodin 10/325 mg #30 Take one-two every 6 to 8 hours as needed for pain. No refill

## 2011-08-16 NOTE — Assessment & Plan Note (Signed)
No history of CAD.  Normal myovue 2007  Quit smoking and relatively normal ECG.  She is terrified of any pharmacologic stress test.  Had no issues with previous left fem pop.  Right side does not entail major abdominal procedure.  Clear to have surgery in a week or two with Dr Darrick Penna.  [facility] can call us to follow post op if needed

## 2011-08-16 NOTE — Assessment & Plan Note (Signed)
Discussed low carb diet.  Target hemoglobin A1c is 6.5 or less.  Continue current medications.  

## 2011-08-17 ENCOUNTER — Encounter: Payer: Self-pay | Admitting: *Deleted

## 2011-08-17 ENCOUNTER — Other Ambulatory Visit: Payer: Self-pay | Admitting: *Deleted

## 2011-08-21 ENCOUNTER — Encounter: Payer: Self-pay | Admitting: Neurosurgery

## 2011-08-21 ENCOUNTER — Encounter (HOSPITAL_COMMUNITY): Payer: Self-pay | Admitting: Respiratory Therapy

## 2011-08-21 ENCOUNTER — Encounter (HOSPITAL_COMMUNITY): Payer: Self-pay

## 2011-08-21 ENCOUNTER — Encounter (HOSPITAL_COMMUNITY)
Admission: RE | Admit: 2011-08-21 | Discharge: 2011-08-21 | Disposition: A | Discharge status: Home / Self Care | Payer: PRIVATE HEALTH INSURANCE | Source: Ambulatory Visit | Attending: Vascular Surgery | Admitting: Vascular Surgery

## 2011-08-21 ENCOUNTER — Ambulatory Visit (INDEPENDENT_AMBULATORY_CARE_PROVIDER_SITE_OTHER): Payer: PRIVATE HEALTH INSURANCE | Admitting: Neurosurgery

## 2011-08-21 VITALS — BP 147/88 | HR 102 | Resp 16 | Ht 68.0 in | Wt 238.0 lb

## 2011-08-21 DIAGNOSIS — I739 Peripheral vascular disease, unspecified: Secondary | ICD-10-CM

## 2011-08-21 HISTORY — DX: Personal history of other diseases of the digestive system: Z87.19

## 2011-08-21 HISTORY — DX: Sleep apnea, unspecified: G47.30

## 2011-08-21 HISTORY — DX: Fibromyalgia: M79.7

## 2011-08-21 LAB — URINALYSIS, ROUTINE W REFLEX MICROSCOPIC
Bilirubin Urine: NEGATIVE
Glucose, UA: 250 mg/dL — AB
Ketones, ur: NEGATIVE mg/dL
Nitrite: NEGATIVE
Protein, ur: NEGATIVE mg/dL
Specific Gravity, Urine: 1.017 (ref 1.005–1.030)
Urobilinogen, UA: 0.2 mg/dL (ref 0.0–1.0)
pH: 5 (ref 5.0–8.0)

## 2011-08-21 LAB — CBC
HCT: 41.4 % (ref 36.0–46.0)
Hemoglobin: 13.6 g/dL (ref 12.0–15.0)
MCH: 30.4 pg (ref 26.0–34.0)
MCHC: 32.9 g/dL (ref 30.0–36.0)
MCV: 92.6 fL (ref 78.0–100.0)
Platelets: 314 10*3/uL (ref 150–400)
RBC: 4.47 MIL/uL (ref 3.87–5.11)
RDW: 12.7 % (ref 11.5–15.5)
WBC: 5.6 10*3/uL (ref 4.0–10.5)

## 2011-08-21 LAB — COMPREHENSIVE METABOLIC PANEL
ALT: 19 U/L (ref 0–35)
AST: 18 U/L (ref 0–37)
Albumin: 3.1 g/dL — ABNORMAL LOW (ref 3.5–5.2)
Alkaline Phosphatase: 82 U/L (ref 39–117)
BUN: 12 mg/dL (ref 6–23)
CO2: 24 mEq/L (ref 19–32)
Calcium: 8.6 mg/dL (ref 8.4–10.5)
Chloride: 103 mEq/L (ref 96–112)
Creatinine, Ser: 0.69 mg/dL (ref 0.50–1.10)
GFR calc Af Amer: >90 mL/min (ref >90)
GFR calc non Af Amer: >90 mL/min (ref >90)
Glucose, Bld: 160 mg/dL — ABNORMAL HIGH (ref 70–99)
Potassium: 3.7 mEq/L (ref 3.5–5.1)
Sodium: 138 mEq/L (ref 135–145)
Total Bilirubin: 0.6 mg/dL (ref 0.3–1.2)
Total Protein: 7.3 g/dL (ref 6.0–8.3)

## 2011-08-21 LAB — PROTIME-INR
INR: 0.91 (ref 0.00–1.49)
Prothrombin Time: 12.4 seconds (ref 11.6–15.2)

## 2011-08-21 LAB — HCG, SERUM, QUALITATIVE: Preg, Serum: NEGATIVE

## 2011-08-21 LAB — URINE MICROSCOPIC-ADD ON

## 2011-08-21 LAB — TYPE AND SCREEN
ABO/RH(D): O POS
Antibody Screen: NEGATIVE

## 2011-08-21 LAB — SURGICAL PCR SCREEN
MRSA, PCR: NEGATIVE
Staphylococcus aureus: NEGATIVE

## 2011-08-21 LAB — APTT: aPTT: 27 seconds (ref 24–37)

## 2011-08-21 NOTE — Pre-Procedure Instructions (Addendum)
20 Kelly Barber  08/21/2011   Your procedure is scheduled on:  08/27/11*  Report to [facility] at 530 AM.  Call this number if you have problems the morning of surgery: [phone]   Remember:   Do not eat food:After Midnight.  .  Take these medicines the morning of surgery with A SIP OF WATER: xanax, wellbutrin,celexa   No insulin or blood pressure med   Do not wear jewelry, make-up or nail polish.  Do not wear lotions, powders, or perfumes. You may wear deodorant.  Do not shave 48 hours prior to surgery. Men may shave face and neck.  Do not bring valuables to the hospital.  Contacts, dentures or bridgework may not be worn into surgery.  Leave suitcase in the car. After surgery it may be brought to your room.  For patients admitted to the hospital, checkout time is 11:00 AM the day of discharge.   Patients discharged the day of surgery will not be allowed to drive home.  Name and phone number of your driver: Kelly Barber [phone]  Special Instructions: CHG Shower Use Special Wash: 1/2 bottle night before surgery and 1/2 bottle morning of surgery.   Please read over the following fact sheets that you were given: Pain Booklet, Coughing and Deep Breathing, Blood Transfusion Information, MRSA Information and Surgical Site Infection Prevention

## 2011-08-21 NOTE — Progress Notes (Signed)
Ekg, 13 echo, 07, stress 07 nishan note 6/13

## 2011-08-21 NOTE — Progress Notes (Signed)
Subjective:     Patient ID: Kelly Barber, female   DOB: 04/01/1965, 45 y.o.   MRN: [id number]  HPI: This is a 45-year-old female patient of Dr. Fields that is scheduled for a femoropopliteal bypass on Monday, June 24. The patient walked into the office today stating that she had been to the hospital for her preoperative workup and came in to be seen for "bleeding" in her right foot. The patient tells me she sees blood on her sock when she removes it however there is no active bleeding now. The patient states at the end of our conversation regarding her current problem that she is "eating that pain medicine like tic tac's", and may call in the next day or so for another refill. The last refill was June 13 per Dr. Fields.   Review of Systems: 12 point review of systems is notable for the difficulties described above otherwise unremarkable     Objective:   Physical Exam: Vital signs are stable, patient is afebrile. On inspection of the foot there is no bleeding, I do not see any open areas, the patient states the bleeding was 23rd and fourth digits which I inspected and did not see any active bleeding or open wound. The patient is ambulating with a cast shoe.     Assessment:     Preoperative patient of Dr. Fields scheduled for right femoropopliteal bypass on Monday, June 24. No bleeding is seen, the patient was reassured that if there is any small amount weeping she should be fine but if the right fifth digit turns black or gangrenous or bleeding is more profuse she should call our office.    Plan:     The patient will undergo a right femoropopliteal bypass with Dr. Fields on Monday, June 24, I did not refill pain medicine today as I am not exactly sure how much more narcotic Dr. Fields wants this patient to have preoperatively. The patient stated understanding she will follow through with her procedures scheduled.    Kelly Barber ANP  Clinic M.D.: [facility]  

## 2011-08-22 ENCOUNTER — Telehealth: Payer: Self-pay | Admitting: *Deleted

## 2011-08-23 ENCOUNTER — Telehealth: Payer: Self-pay | Admitting: *Deleted

## 2011-08-23 NOTE — Telephone Encounter (Signed)
Waiting to speak to Dr Darrick Penna for order

## 2011-08-23 NOTE — Telephone Encounter (Signed)
Patient called requesting pain medication. Surgery is scheduled Monday, 08/27/11. Per Dr Darrick Penna call in Vicodin 7.5/500 one to two every four to six hours as needed for pain. NO REFILL

## 2011-08-26 MED ORDER — SODIUM CHLORIDE 0.9 % IV SOLN: INTRAVENOUS | Status: DC

## 2011-08-26 MED ORDER — VANCOMYCIN HCL 1000 MG IV SOLR
1500.0000 mg | INTRAVENOUS | Status: DC
  Filled 2011-08-26: qty 1500

## 2011-08-27 ENCOUNTER — Encounter (HOSPITAL_COMMUNITY)
Admission: RE | Disposition: A | Discharge status: Home / Self Care | Payer: Self-pay | Source: Ambulatory Visit | Attending: Vascular Surgery

## 2011-08-27 ENCOUNTER — Encounter (HOSPITAL_COMMUNITY): Payer: Self-pay | Admitting: Surgery

## 2011-08-27 ENCOUNTER — Encounter (HOSPITAL_COMMUNITY): Payer: Self-pay | Admitting: Anesthesiology

## 2011-08-27 ENCOUNTER — Inpatient Hospital Stay (HOSPITAL_COMMUNITY)
Admission: RE | Admit: 2011-08-27 | Discharge: 2011-08-31 | DRG: 253 | Disposition: A | Discharge status: Home / Self Care | Payer: PRIVATE HEALTH INSURANCE | Source: Ambulatory Visit | Attending: Vascular Surgery | Admitting: Vascular Surgery

## 2011-08-27 ENCOUNTER — Ambulatory Visit (HOSPITAL_COMMUNITY): Payer: PRIVATE HEALTH INSURANCE

## 2011-08-27 ENCOUNTER — Ambulatory Visit (HOSPITAL_COMMUNITY): Payer: PRIVATE HEALTH INSURANCE | Admitting: Anesthesiology

## 2011-08-27 DIAGNOSIS — IMO0001 Reserved for inherently not codable concepts without codable children: Secondary | ICD-10-CM | POA: Diagnosis present

## 2011-08-27 DIAGNOSIS — F411 Generalized anxiety disorder: Secondary | ICD-10-CM | POA: Diagnosis present

## 2011-08-27 DIAGNOSIS — Z6836 Body mass index (BMI) 36.0-36.9, adult: Secondary | ICD-10-CM

## 2011-08-27 DIAGNOSIS — K219 Gastro-esophageal reflux disease without esophagitis: Secondary | ICD-10-CM | POA: Diagnosis present

## 2011-08-27 DIAGNOSIS — I70219 Atherosclerosis of native arteries of extremities with intermittent claudication, unspecified extremity: Secondary | ICD-10-CM

## 2011-08-27 DIAGNOSIS — E669 Obesity, unspecified: Secondary | ICD-10-CM | POA: Diagnosis present

## 2011-08-27 DIAGNOSIS — B999 Unspecified infectious disease: Secondary | ICD-10-CM | POA: Diagnosis present

## 2011-08-27 DIAGNOSIS — H019 Unspecified inflammation of eyelid: Secondary | ICD-10-CM | POA: Diagnosis present

## 2011-08-27 DIAGNOSIS — I70409 Unspecified atherosclerosis of autologous vein bypass graft(s) of the extremities, unspecified extremity: Secondary | ICD-10-CM | POA: Diagnosis not present

## 2011-08-27 DIAGNOSIS — E119 Type 2 diabetes mellitus without complications: Secondary | ICD-10-CM | POA: Diagnosis present

## 2011-08-27 DIAGNOSIS — I70229 Atherosclerosis of native arteries of extremities with rest pain, unspecified extremity: Secondary | ICD-10-CM

## 2011-08-27 DIAGNOSIS — G4730 Sleep apnea, unspecified: Secondary | ICD-10-CM | POA: Diagnosis present

## 2011-08-27 DIAGNOSIS — K449 Diaphragmatic hernia without obstruction or gangrene: Secondary | ICD-10-CM | POA: Diagnosis present

## 2011-08-27 DIAGNOSIS — S98139A Complete traumatic amputation of one unspecified lesser toe, initial encounter: Secondary | ICD-10-CM

## 2011-08-27 DIAGNOSIS — L97309 Non-pressure chronic ulcer of unspecified ankle with unspecified severity: Secondary | ICD-10-CM | POA: Diagnosis present

## 2011-08-27 DIAGNOSIS — E042 Nontoxic multinodular goiter: Secondary | ICD-10-CM | POA: Diagnosis present

## 2011-08-27 DIAGNOSIS — L98499 Non-pressure chronic ulcer of skin of other sites with unspecified severity: Principal | ICD-10-CM | POA: Diagnosis present

## 2011-08-27 DIAGNOSIS — I739 Peripheral vascular disease, unspecified: Principal | ICD-10-CM | POA: Diagnosis present

## 2011-08-27 DIAGNOSIS — F329 Major depressive disorder, single episode, unspecified: Secondary | ICD-10-CM | POA: Diagnosis present

## 2011-08-27 DIAGNOSIS — I10 Essential (primary) hypertension: Secondary | ICD-10-CM | POA: Diagnosis present

## 2011-08-27 HISTORY — PX: FEMORAL-POPLITEAL BYPASS GRAFT: SHX937

## 2011-08-27 LAB — GLUCOSE, CAPILLARY
Glucose-Capillary: 141 mg/dL — ABNORMAL HIGH (ref 70–99)
Glucose-Capillary: 161 mg/dL — ABNORMAL HIGH (ref 70–99)
Glucose-Capillary: 102 mg/dL — ABNORMAL HIGH (ref 70–99)

## 2011-08-27 SURGERY — BYPASS GRAFT FEMORAL-POPLITEAL ARTERY
Anesthesia: General | Site: Leg Upper | Laterality: Right | Wound class: Clean

## 2011-08-27 MED ORDER — METOPROLOL TARTRATE 1 MG/ML IV SOLN: 2.0000 mg | INTRAVENOUS | Status: DC | PRN

## 2011-08-27 MED ORDER — NEOSTIGMINE METHYLSULFATE 1 MG/ML IJ SOLN
INTRAMUSCULAR | Status: DC | PRN
  Administered 2011-08-27: 3 mg via INTRAVENOUS

## 2011-08-27 MED ORDER — LABETALOL HCL 5 MG/ML IV SOLN
10.0000 mg | INTRAVENOUS | Status: DC | PRN
  Filled 2011-08-27: qty 4

## 2011-08-27 MED ORDER — INSULIN ASPART 100 UNIT/ML ~~LOC~~ SOLN
0.0000 [IU] | Freq: Every day | SUBCUTANEOUS | Status: DC
  Administered 2011-08-30: 3 [IU] via SUBCUTANEOUS

## 2011-08-27 MED ORDER — LACTATED RINGERS IV SOLN
INTRAVENOUS | Status: DC | PRN
  Administered 2011-08-27 (×3): via INTRAVENOUS

## 2011-08-27 MED ORDER — INSULIN GLARGINE 100 UNIT/ML ~~LOC~~ SOLN
60.0000 [IU] | Freq: Every day | SUBCUTANEOUS | Status: DC
  Administered 2011-08-28 – 2011-08-30 (×3): 60 [IU] via SUBCUTANEOUS

## 2011-08-27 MED ORDER — PANTOPRAZOLE SODIUM 40 MG PO TBEC
40.0000 mg | DELAYED_RELEASE_TABLET | Freq: Every day | ORAL | Status: DC
  Administered 2011-08-27 – 2011-08-30 (×4): 40 mg via ORAL
  Filled 2011-08-27 (×4): qty 1

## 2011-08-27 MED ORDER — MORPHINE SULFATE 2 MG/ML IJ SOLN
INTRAMUSCULAR | Status: AC
  Filled 2011-08-27: qty 1

## 2011-08-27 MED ORDER — DOPAMINE-DEXTROSE 3.2-5 MG/ML-% IV SOLN: 3.0000 ug/kg/min | INTRAVENOUS | Status: DC

## 2011-08-27 MED ORDER — PHENOL 1.4 % MT LIQD: 1.0000 | OROMUCOSAL | Status: DC | PRN

## 2011-08-27 MED ORDER — VECURONIUM BROMIDE 10 MG IV SOLR
INTRAVENOUS | Status: DC | PRN
  Administered 2011-08-27: 1 mg via INTRAVENOUS
  Administered 2011-08-27 (×3): 2 mg via INTRAVENOUS

## 2011-08-27 MED ORDER — VANCOMYCIN HCL 1000 MG IV SOLR
1000.0000 mg | INTRAVENOUS | Status: DC | PRN
  Administered 2011-08-27: 1500 mg via INTRAVENOUS

## 2011-08-27 MED ORDER — ALPRAZOLAM 0.5 MG PO TABS
1.0000 mg | ORAL_TABLET | Freq: Four times a day (QID) | ORAL | Status: DC | PRN
  Administered 2011-08-27: 1 mg via ORAL
  Filled 2011-08-27: qty 2

## 2011-08-27 MED ORDER — HYDRALAZINE HCL 20 MG/ML IJ SOLN
10.0000 mg | INTRAMUSCULAR | Status: DC | PRN
  Filled 2011-08-27: qty 0.5

## 2011-08-27 MED ORDER — BUPROPION HCL ER (XL) 300 MG PO TB24
300.0000 mg | ORAL_TABLET | Freq: Every day | ORAL | Status: DC
  Administered 2011-08-28 – 2011-08-31 (×4): 300 mg via ORAL
  Filled 2011-08-27 (×4): qty 1

## 2011-08-27 MED ORDER — IOHEXOL 300 MG/ML  SOLN
INTRAMUSCULAR | Status: DC | PRN
  Administered 2011-08-27: 27 mL via INTRAVENOUS

## 2011-08-27 MED ORDER — ALBUMIN HUMAN 5 % IV SOLN
INTRAVENOUS | Status: DC | PRN
  Administered 2011-08-27: 10:00:00 via INTRAVENOUS

## 2011-08-27 MED ORDER — IOHEXOL 300 MG/ML  SOLN
INTRAMUSCULAR | Status: AC
  Filled 2011-08-27: qty 1

## 2011-08-27 MED ORDER — LIDOCAINE HCL (CARDIAC) 20 MG/ML IV SOLN
INTRAVENOUS | Status: DC | PRN
  Administered 2011-08-27: 70 mg via INTRAVENOUS

## 2011-08-27 MED ORDER — ALUM & MAG HYDROXIDE-SIMETH 200-200-20 MG/5ML PO SUSP: 15.0000 mL | ORAL | Status: DC | PRN

## 2011-08-27 MED ORDER — INSULIN REGULAR HUMAN 100 UNIT/ML IJ SOLN: 20.0000 [IU] | Freq: Three times a day (TID) | INTRAMUSCULAR | Status: DC

## 2011-08-27 MED ORDER — VALSARTAN-HYDROCHLOROTHIAZIDE 160-25 MG PO TABS: 1.0000 | ORAL_TABLET | Freq: Every day | ORAL | Status: DC

## 2011-08-27 MED ORDER — SODIUM CHLORIDE 0.9 % IV SOLN
INTRAVENOUS | Status: DC
  Administered 2011-08-27: 15:00:00 via INTRAVENOUS

## 2011-08-27 MED ORDER — INSULIN ASPART 100 UNIT/ML ~~LOC~~ SOLN
0.0000 [IU] | Freq: Three times a day (TID) | SUBCUTANEOUS | Status: DC
  Administered 2011-08-27 – 2011-08-29 (×4): 4 [IU] via SUBCUTANEOUS
  Administered 2011-08-30: 3 [IU] via SUBCUTANEOUS
  Administered 2011-08-30 – 2011-08-31 (×2): 4 [IU] via SUBCUTANEOUS

## 2011-08-27 MED ORDER — MORPHINE SULFATE 2 MG/ML IJ SOLN
2.0000 mg | INTRAMUSCULAR | Status: DC | PRN
  Administered 2011-08-27 (×2): 4 mg via INTRAVENOUS
  Administered 2011-08-27 (×2): 2 mg via INTRAVENOUS
  Administered 2011-08-27 – 2011-08-30 (×9): 4 mg via INTRAVENOUS
  Administered 2011-08-30: 2 mg via INTRAVENOUS
  Administered 2011-08-30: 4 mg via INTRAVENOUS
  Administered 2011-08-30 (×2): 2 mg via INTRAVENOUS
  Administered 2011-08-30 – 2011-08-31 (×3): 4 mg via INTRAVENOUS
  Administered 2011-08-31: 2 mg via INTRAVENOUS
  Filled 2011-08-27 (×4): qty 2
  Filled 2011-08-27: qty 1
  Filled 2011-08-27 (×2): qty 2
  Filled 2011-08-27: qty 1
  Filled 2011-08-27 (×2): qty 2
  Filled 2011-08-27: qty 1
  Filled 2011-08-27: qty 2
  Filled 2011-08-27: qty 1
  Filled 2011-08-27 (×3): qty 2
  Filled 2011-08-27: qty 1
  Filled 2011-08-27: qty 2

## 2011-08-27 MED ORDER — BISACODYL 5 MG PO TBEC: 5.0000 mg | DELAYED_RELEASE_TABLET | Freq: Every day | ORAL | Status: DC | PRN

## 2011-08-27 MED ORDER — ACETAMINOPHEN 650 MG RE SUPP: 325.0000 mg | RECTAL | Status: DC | PRN

## 2011-08-27 MED ORDER — ONDANSETRON HCL 4 MG/2ML IJ SOLN: 4.0000 mg | Freq: Four times a day (QID) | INTRAMUSCULAR | Status: DC | PRN

## 2011-08-27 MED ORDER — ONDANSETRON HCL 4 MG/2ML IJ SOLN
4.0000 mg | Freq: Four times a day (QID) | INTRAMUSCULAR | Status: DC | PRN
  Administered 2011-08-29: 4 mg via INTRAVENOUS

## 2011-08-27 MED ORDER — GLYCOPYRROLATE 0.2 MG/ML IJ SOLN
INTRAMUSCULAR | Status: DC | PRN
  Administered 2011-08-27: .4 mg via INTRAVENOUS

## 2011-08-27 MED ORDER — IRBESARTAN 150 MG PO TABS
150.0000 mg | ORAL_TABLET | Freq: Every day | ORAL | Status: DC
  Administered 2011-08-27 – 2011-08-31 (×5): 150 mg via ORAL
  Filled 2011-08-27 (×5): qty 1

## 2011-08-27 MED ORDER — HYDROMORPHONE HCL PF 1 MG/ML IJ SOLN: 0.2500 mg | INTRAMUSCULAR | Status: DC | PRN

## 2011-08-27 MED ORDER — OXYCODONE HCL 5 MG PO TABS
5.0000 mg | ORAL_TABLET | ORAL | Status: DC | PRN
  Administered 2011-08-27 – 2011-08-28 (×4): 5 mg via ORAL
  Filled 2011-08-27 (×4): qty 1

## 2011-08-27 MED ORDER — VANCOMYCIN HCL IN DEXTROSE 1-5 GM/200ML-% IV SOLN
1000.0000 mg | Freq: Once | INTRAVENOUS | Status: AC
  Administered 2011-08-27: 1000 mg via INTRAVENOUS
  Filled 2011-08-27: qty 200

## 2011-08-27 MED ORDER — FENTANYL CITRATE 0.05 MG/ML IJ SOLN
INTRAMUSCULAR | Status: DC | PRN
  Administered 2011-08-27: 50 ug via INTRAVENOUS
  Administered 2011-08-27: 75 ug via INTRAVENOUS
  Administered 2011-08-27: 25 ug via INTRAVENOUS
  Administered 2011-08-27 (×2): 50 ug via INTRAVENOUS

## 2011-08-27 MED ORDER — LACTATED RINGERS IV SOLN
INTRAVENOUS | Status: DC | PRN
  Administered 2011-08-27 (×2): via INTRAVENOUS

## 2011-08-27 MED ORDER — INSULIN ASPART 100 UNIT/ML ~~LOC~~ SOLN
20.0000 [IU] | Freq: Three times a day (TID) | SUBCUTANEOUS | Status: DC
  Administered 2011-08-27 – 2011-08-31 (×7): 20 [IU] via SUBCUTANEOUS

## 2011-08-27 MED ORDER — MIDAZOLAM HCL 5 MG/5ML IJ SOLN
INTRAMUSCULAR | Status: DC | PRN
  Administered 2011-08-27: 2 mg via INTRAVENOUS

## 2011-08-27 MED ORDER — ACETAMINOPHEN 325 MG PO TABS
325.0000 mg | ORAL_TABLET | ORAL | Status: DC | PRN
  Administered 2011-08-28: 650 mg via ORAL
  Filled 2011-08-27: qty 2

## 2011-08-27 MED ORDER — PHENYLEPHRINE HCL 10 MG/ML IJ SOLN
INTRAMUSCULAR | Status: DC | PRN
  Administered 2011-08-27 (×2): 40 ug via INTRAVENOUS
  Administered 2011-08-27 (×11): 80 ug via INTRAVENOUS

## 2011-08-27 MED ORDER — SODIUM CHLORIDE 0.9 % IR SOLN
Status: DC | PRN
  Administered 2011-08-27: 09:00:00

## 2011-08-27 MED ORDER — MAGNESIUM SULFATE 40 MG/ML IJ SOLN
2.0000 g | Freq: Once | INTRAMUSCULAR | Status: AC | PRN
  Filled 2011-08-27: qty 50

## 2011-08-27 MED ORDER — SODIUM CHLORIDE 0.9 % IV SOLN: 500.0000 mL | Freq: Once | INTRAVENOUS | Status: AC | PRN

## 2011-08-27 MED ORDER — ONDANSETRON HCL 4 MG/2ML IJ SOLN
INTRAMUSCULAR | Status: DC | PRN
  Administered 2011-08-27: 4 mg via INTRAVENOUS

## 2011-08-27 MED ORDER — PROPOFOL 10 MG/ML IV EMUL
INTRAVENOUS | Status: DC | PRN
  Administered 2011-08-27: 200 mg via INTRAVENOUS

## 2011-08-27 MED ORDER — HYDROCHLOROTHIAZIDE 25 MG PO TABS
25.0000 mg | ORAL_TABLET | Freq: Every day | ORAL | Status: DC
  Administered 2011-08-27 – 2011-08-31 (×5): 25 mg via ORAL
  Filled 2011-08-27 (×5): qty 1

## 2011-08-27 MED ORDER — DOCUSATE SODIUM 100 MG PO CAPS
100.0000 mg | ORAL_CAPSULE | Freq: Every day | ORAL | Status: DC
  Administered 2011-08-28 – 2011-08-31 (×4): 100 mg via ORAL
  Filled 2011-08-27 (×4): qty 1

## 2011-08-27 MED ORDER — 0.9 % SODIUM CHLORIDE (POUR BTL) OPTIME
TOPICAL | Status: DC | PRN
  Administered 2011-08-27: 1000 mL

## 2011-08-27 MED ORDER — ROCURONIUM BROMIDE 100 MG/10ML IV SOLN
INTRAVENOUS | Status: DC | PRN
  Administered 2011-08-27: 50 mg via INTRAVENOUS

## 2011-08-27 MED ORDER — POTASSIUM CHLORIDE CRYS ER 20 MEQ PO TBCR: 20.0000 meq | EXTENDED_RELEASE_TABLET | Freq: Once | ORAL | Status: AC | PRN

## 2011-08-27 MED ORDER — CITALOPRAM HYDROBROMIDE 20 MG PO TABS
20.0000 mg | ORAL_TABLET | Freq: Every day | ORAL | Status: DC
  Administered 2011-08-28 – 2011-08-31 (×4): 20 mg via ORAL
  Filled 2011-08-27 (×4): qty 1

## 2011-08-27 MED ORDER — HEPARIN SODIUM (PORCINE) 1000 UNIT/ML IJ SOLN
INTRAMUSCULAR | Status: DC | PRN
  Administered 2011-08-27: 10 mL via INTRAVENOUS

## 2011-08-27 MED ORDER — SENNOSIDES-DOCUSATE SODIUM 8.6-50 MG PO TABS
1.0000 | ORAL_TABLET | Freq: Every evening | ORAL | Status: DC | PRN
  Filled 2011-08-27: qty 1

## 2011-08-27 SURGICAL SUPPLY — 68 items
BANDAGE ESMARK 6X9 LF (GAUZE/BANDAGES/DRESSINGS) IMPLANT
BNDG ESMARK 6X9 LF (GAUZE/BANDAGES/DRESSINGS)
CANISTER SUCTION 2500CC (MISCELLANEOUS) ×3 IMPLANT
CANNULA VESSEL W/WING WO/VALVE (CANNULA) ×3 IMPLANT
CLIP TI MEDIUM 24 (CLIP) ×3 IMPLANT
CLIP TI WIDE RED SMALL 24 (CLIP) ×6 IMPLANT
CLOTH BEACON ORANGE TIMEOUT ST (SAFETY) ×3 IMPLANT
CORONARY SUCKER SOFT TIP 10052 (MISCELLANEOUS) IMPLANT
COVER SURGICAL LIGHT HANDLE (MISCELLANEOUS) ×6 IMPLANT
CUFF TOURNIQUET SINGLE 24IN (TOURNIQUET CUFF) IMPLANT
CUFF TOURNIQUET SINGLE 34IN LL (TOURNIQUET CUFF) IMPLANT
CUFF TOURNIQUET SINGLE 44IN (TOURNIQUET CUFF) IMPLANT
DECANTER SPIKE VIAL GLASS SM (MISCELLANEOUS) IMPLANT
DERMABOND ADVANCED (GAUZE/BANDAGES/DRESSINGS) ×1
DERMABOND ADVANCED .7 DNX12 (GAUZE/BANDAGES/DRESSINGS) ×2 IMPLANT
DRAIN SNY WOU (WOUND CARE) IMPLANT
DRAPE WARM FLUID 44X44 (DRAPE) ×3 IMPLANT
DRAPE X-RAY CASS 24X20 (DRAPES) ×3 IMPLANT
DRSG COVADERM 4X10 (GAUZE/BANDAGES/DRESSINGS) IMPLANT
DRSG COVADERM 4X8 (GAUZE/BANDAGES/DRESSINGS) ×3 IMPLANT
ELECT REM PT RETURN 9FT ADLT (ELECTROSURGICAL) ×3
ELECTRODE REM PT RTRN 9FT ADLT (ELECTROSURGICAL) ×2 IMPLANT
EVACUATOR SILICONE 100CC (DRAIN) IMPLANT
GLOVE BIO SURGEON STRL SZ7.5 (GLOVE) ×3 IMPLANT
GLOVE BIOGEL PI IND STRL 6.5 (GLOVE) ×6 IMPLANT
GLOVE BIOGEL PI IND STRL 7.0 (GLOVE) ×6 IMPLANT
GLOVE BIOGEL PI IND STRL 7.5 (GLOVE) ×4 IMPLANT
GLOVE BIOGEL PI IND STRL 8 (GLOVE) ×2 IMPLANT
GLOVE BIOGEL PI INDICATOR 6.5 (GLOVE) ×3
GLOVE BIOGEL PI INDICATOR 7.0 (GLOVE) ×3
GLOVE BIOGEL PI INDICATOR 7.5 (GLOVE) ×2
GLOVE BIOGEL PI INDICATOR 8 (GLOVE) ×1
GLOVE ECLIPSE 6.5 STRL STRAW (GLOVE) ×12 IMPLANT
GOWN PREVENTION PLUS XLARGE (GOWN DISPOSABLE) ×3 IMPLANT
GOWN STRL NON-REIN LRG LVL3 (GOWN DISPOSABLE) ×9 IMPLANT
GOWN STRL REIN 2XL LVL4 (GOWN DISPOSABLE) ×6 IMPLANT
KIT BASIN OR (CUSTOM PROCEDURE TRAY) ×3 IMPLANT
KIT ROOM TURNOVER OR (KITS) ×3 IMPLANT
MARKER GRAFT CORONARY BYPASS (MISCELLANEOUS) IMPLANT
NS IRRIG 1000ML POUR BTL (IV SOLUTION) ×6 IMPLANT
PACK PERIPHERAL VASCULAR (CUSTOM PROCEDURE TRAY) ×3 IMPLANT
PAD ARMBOARD 7.5X6 YLW CONV (MISCELLANEOUS) ×6 IMPLANT
PADDING CAST COTTON 6X4 STRL (CAST SUPPLIES) IMPLANT
SET COLLECT BLD 21X3/4 12 (NEEDLE) ×3 IMPLANT
SPONGE GAUZE 4X4 12PLY (GAUZE/BANDAGES/DRESSINGS) ×3 IMPLANT
SPONGE SURGIFOAM ABS GEL 100 (HEMOSTASIS) IMPLANT
STAPLER VISISTAT 35W (STAPLE) ×3 IMPLANT
STOPCOCK 4 WAY LG BORE MALE ST (IV SETS) ×3 IMPLANT
SUT PROLENE 5 0 C 1 24 (SUTURE) ×3 IMPLANT
SUT PROLENE 5 0 C 1 36 (SUTURE) ×3 IMPLANT
SUT PROLENE 6 0 CC (SUTURE) ×6 IMPLANT
SUT PROLENE 7 0 BV 1 (SUTURE) ×3 IMPLANT
SUT PROLENE 7 0 BV1 MDA (SUTURE) IMPLANT
SUT SILK 2 0 SH (SUTURE) ×3 IMPLANT
SUT SILK 2 0 SH CR/8 (SUTURE) ×3 IMPLANT
SUT SILK 3 0 (SUTURE) ×2
SUT SILK 3-0 18XBRD TIE 12 (SUTURE) ×4 IMPLANT
SUT VIC AB 2-0 CTX 36 (SUTURE) ×6 IMPLANT
SUT VIC AB 3-0 SH 27 (SUTURE) ×6
SUT VIC AB 3-0 SH 27X BRD (SUTURE) ×12 IMPLANT
SUT VIC AB 4-0 PS2 27 (SUTURE) IMPLANT
TAPE UMBILICAL COTTON 1/8X30 (MISCELLANEOUS) ×3 IMPLANT
TOWEL OR 17X24 6PK STRL BLUE (TOWEL DISPOSABLE) ×6 IMPLANT
TOWEL OR 17X26 10 PK STRL BLUE (TOWEL DISPOSABLE) ×6 IMPLANT
TRAY FOLEY CATH 14FRSI W/METER (CATHETERS) ×3 IMPLANT
TUBING EXTENTION W/L.L. (IV SETS) ×3 IMPLANT
UNDERPAD 30X30 INCONTINENT (UNDERPADS AND DIAPERS) ×3 IMPLANT
WATER STERILE IRR 1000ML POUR (IV SOLUTION) ×3 IMPLANT

## 2011-08-27 NOTE — Addendum Note (Signed)
Addendum  created 08/27/11 1351 by Yunus Stoklosa J Gladstone Rosas, CRNA   Modules edited:Anesthesia Medication Administration    

## 2011-08-27 NOTE — Anesthesia Postprocedure Evaluation (Signed)
Anesthesia Post Note  Patient: Kelly Barber  Procedure(s) Performed: Procedure(s) (LRB): BYPASS GRAFT FEMORAL-POPLITEAL ARTERY (Right) ANGIOGRAM EXTREMITY RIGHT (Right)  Anesthesia type: General  Patient location: PACU  Post pain: Pain level controlled and Adequate analgesia  Post assessment: Post-op Vital signs reviewed, Patient's Cardiovascular Status Stable, Respiratory Function Stable, Patent Airway and Pain level controlled  Last Vitals:  Filed Vitals:   08/27/11 1305  BP:   Pulse:   Temp: 37 C  Resp:     Post vital signs: Reviewed and stable  Level of consciousness: awake, alert  and oriented  Complications: No apparent anesthesia complications

## 2011-08-27 NOTE — Progress Notes (Signed)
Pt CBG 102. Dr. Darrick Penna notified and order to hold tonight's Lantus dose. Will continue to monitor.

## 2011-08-27 NOTE — Interval H&P Note (Signed)
History and Physical Interval Note:  08/27/2011 7:31 AM  Kelly Barber  has presented today for surgery, with the diagnosis of PVD WITH ULCER  The various methods of treatment have been discussed with the patient and family. After consideration of risks, benefits and other options for treatment, the patient has consented to  Procedure(s) (LRB): BYPASS GRAFT FEMORAL-POPLITEAL ARTERY (Right) as a surgical intervention .  The patient's history has been reviewed, patient examined, no change in status, stable for surgery.  I have reviewed the patients' chart and labs.  Questions were answered to the patient's satisfaction.     Kelly Barber E

## 2011-08-27 NOTE — Addendum Note (Signed)
Addendum  created 08/27/11 1351 by Jeani Hawking, CRNA   Modules edited:Anesthesia Medication Administration

## 2011-08-27 NOTE — Anesthesia Preprocedure Evaluation (Addendum)
Anesthesia Evaluation  Patient identified by MRN, date of birth, ID band Patient awake    Reviewed: Allergy & Precautions, H&P , NPO status , Patient's Chart, lab work & pertinent test results  History of Anesthesia Complications Negative for: history of anesthetic complications  Airway Mallampati: II TM Distance: >3 FB Neck ROM: full    Dental  (+) Dental Advisory Given and Teeth Intact   Pulmonary sleep apnea , former smoker         Cardiovascular hypertension, Pt. on medications + Peripheral Vascular Disease     Neuro/Psych PSYCHIATRIC DISORDERS Anxiety Depression    GI/Hepatic hiatal hernia, GERD-  Controlled,  Endo/Other  Diabetes mellitus-, Well Controlled, Type 2, Insulin Dependent and Oral Hypoglycemic Agentsobese  Renal/GU      Musculoskeletal  (+) Fibromyalgia -  Abdominal   Peds  Hematology   Anesthesia Other Findings   Reproductive/Obstetrics                        Anesthesia Physical Anesthesia Plan  ASA: III  Anesthesia Plan: General   Post-op Pain Management:    Induction: Intravenous  Airway Management Planned: Oral ETT  Additional Equipment:   Intra-op Plan:   Post-operative Plan: Extubation in OR  Informed Consent: I have reviewed the patients History and Physical, chart, labs and discussed the procedure including the risks, benefits and alternatives for the proposed anesthesia with the patient or authorized representative who has indicated his/her understanding and acceptance.     Plan Discussed with: CRNA and Surgeon  Anesthesia Plan Comments:         Anesthesia Quick Evaluation

## 2011-08-27 NOTE — H&P (View-Only) (Signed)
Subjective:     Patient ID: Kelly Barber, female   DOB: 1966/02/07, 46 y.o.   MRN: [id number]  HPI: This is a 46 year old female patient of Dr. Darrick Penna that is scheduled for a femoropopliteal bypass on Monday, June 24. The patient walked into the office today stating that she had been to the hospital for her preoperative workup and came in to be seen for "bleeding" in her right foot. The patient tells me she sees blood on her sock when she removes it however there is no active bleeding now. The patient states at the end of our conversation regarding her current problem that she is "eating that pain medicine like tic tac's", and may call in the next day or so for another refill. The last refill was June 13 per Dr. Darrick Penna.   Review of Systems: 12 point review of systems is notable for the difficulties described above otherwise unremarkable     Objective:   Physical Exam: Vital signs are stable, patient is afebrile. On inspection of the foot there is no bleeding, I do not see any open areas, the patient states the bleeding was 23rd and fourth digits which I inspected and did not see any active bleeding or open wound. The patient is ambulating with a cast shoe.     Assessment:     Preoperative patient of Dr. Darrick Penna scheduled for right femoropopliteal bypass on Monday, June 24. No bleeding is seen, the patient was reassured that if there is any small amount weeping she should be fine but if the right fifth digit turns black or gangrenous or bleeding is more profuse she should call our office.    Plan:     The patient will undergo a right femoropopliteal bypass with Dr. Darrick Penna on Monday, June 24, I did not refill pain medicine today as I am not exactly sure how much more narcotic Dr. Darrick Penna wants this patient to have preoperatively. The patient stated understanding she will follow through with her procedures scheduled.    Lauree Chandler ANP  Clinic M.D.: [facility]

## 2011-08-27 NOTE — Op Note (Signed)
Procedure: Right femoral to above-knee popliteal bypass, intraoperative arteriogram  Preoperative diagnosis: Rest pain right foot  Postoperative diagnosis: Same  Anesthesia: General  Asst.: Maureen Collins, PA-C    Operative findings:      #1   3 mm right greater saphenous vein non reversed femoral to above-knee popliteal bypass        #2   Patent above-knee popliteal anastomosis with 2-vessel runoff via the peroneal and anterior tibial artery  Operative details: After obtaining informed consent, the patient was taken to the operating room. The patient was placed in supine position on the operating room table. After induction of general anesthesia and endotracheal intubation, a Foley catheter was placed. Next, the patient's entire right lower extremity was prepped and draped in the usual sterile fashion. A longitudinal incision was then made in the right groin and carried down through the subcutaneous tissues to expose the right common femoral artery. The common femoral artery was dissected free circumferentially. There was a pulse within the common femoral artery. The distal external iliac artery was dissected free circumferentially underneath the inguinal ligament. A vessel loop was also placed around the distal external iliac artery. Dissection was then carried out the level of the femoral bifurcation. The superficial femoral and profunda femoris arteries were dissected free circumferentially and vessel loops placed around these.  Next the right greater saphenous vein was harvested through several skip incisions in the medial right leg. The vein was of good quality approximately 3 mm and uniform diameter throughout its course. Side branches were ligated and divided between silk ties or clips.   Next, a longitudinal incision was made on the medial aspect of the right leg above the knee. The greater saphenous vein was also harvested through this incision. Small side branches were ligated and  divided between silk ties or clips. The vein was reflected posteriorly. The incision was deepened down to the level of the fascia. The fascia was opened and the sartorius muscle reflected posteriorly. The above-knee popliteal space was entered. Dissection was carried down to the level of the above-knee popliteal artery this was dissected free circumferentially.Several centimeters of the popliteal artery was dissected free to make a reasonable area for grafting of the above-knee popliteal distal anastomosis.    A subsartorial tunnel was created connecting the above-knee incision to the groin.  The patient was then given 10000 units of intravenous heparin. The greater saphenous vein was suture ligated distally and transected. The saphenofemoral junction was then oversewn with a 5-0 Prolene. The vein was gently distended with heparinized saline.  After appropriate circulation time of the heparin, the distal right external iliac artery was controlled with a small Cooley clamp. The profunda femoris and superficial femoral arteries were controlled with Vesseloops. A longitudinal opening was made in the common femoral artery just above the femoral bifurcation. The was then spatulated and placed in a non reversed configuration and sewn end of vein to side of artery using a running 5-0 Prolene suture. A valvulotome was then used to lyse all the valves and there was good pulsatile flow through the graft. Graft was marked for orientation.   The graft was then brought through the subsartorial tunnel down to the above-knee popliteal artery. The above-knee popliteal artery was controlled proximally with a Henley clamp and distally with a small Cooley clamp. A longitudinal opening was made in the distal above-knee popliteal artery in an area that was fairly free of calcification. The graft was then cut to length and spatulated and   signed end of graft to side of artery using running 6-0 Prolene suture.  At completion of the  anastomosis everything was forebled backbled and thoroughly flushed. The remainder of the anastomosis was completed and all clamps were removed restoring pulsatile flow to the above-knee popliteal artery.   An intraoperative arteriogram was then obtained using inflow occlusion. A 21-gauge butterfly needle was introduced in the proximal aspect of the graft and with the graft clamped proximally contrast was injected through the butterfly needle. This showed good opacification of the distal anastomosis which was patent. There was two vessel runoff via the anterior tibial and peroneal artery. The 21 gauge butterfly needle was then removed and the hole repaired with a single 6-0 Prolene suture. The patient had monophasic to biphasic Doppler flow in the dorsalis pedis area of the foot. This augmented approximately 75% with unclamping the graft.  After hemostasis was obtained, the deep layers and subcutaneous layers of the above-knee popliteal incision were closed with running 3-0 Vicryl suture. The skin was closed with a 4-0 Vicryl subcuticular stitch. The groin was inspected and found to be hemostatic. This was then closed in multiple layers of running 2 0 and 3-0 Vicryl suture and 4-0 subcuticular stitch. The patient tolerated the procedure well and there were no complications. Instrument sponge and needle counts correct in the case. Patient was taken to the recovery in stable condition.  Harjit Leider, MD [facility] [place] Office: [phone] Pager: [phone] 

## 2011-08-27 NOTE — Op Note (Signed)
Procedure: Right femoral to above-knee popliteal bypass, intraoperative arteriogram  Preoperative diagnosis: Rest pain right foot  Postoperative diagnosis: Same  Anesthesia: General  Asst.: Lianne Cure, PA-C    Operative findings:      #1   3 mm right greater saphenous vein non reversed femoral to above-knee popliteal bypass        #2   Patent above-knee popliteal anastomosis with 2-vessel runoff via the peroneal and anterior tibial artery  Operative details: After obtaining informed consent, the patient was taken to the operating room. The patient was placed in supine position on the operating room table. After induction of general anesthesia and endotracheal intubation, a Foley catheter was placed. Next, the patient's entire right lower extremity was prepped and draped in the usual sterile fashion. A longitudinal incision was then made in the right groin and carried down through the subcutaneous tissues to expose the right common femoral artery. The common femoral artery was dissected free circumferentially. There was a pulse within the common femoral artery. The distal external iliac artery was dissected free circumferentially underneath the inguinal ligament. A vessel loop was also placed around the distal external iliac artery. Dissection was then carried out the level of the femoral bifurcation. The superficial femoral and profunda femoris arteries were dissected free circumferentially and vessel loops placed around these.  Next the right greater saphenous vein was harvested through several skip incisions in the medial right leg. The vein was of good quality approximately 3 mm and uniform diameter throughout its course. Side branches were ligated and divided between silk ties or clips.   Next, a longitudinal incision was made on the medial aspect of the right leg above the knee. The greater saphenous vein was also harvested through this incision. Small side branches were ligated and  divided between silk ties or clips. The vein was reflected posteriorly. The incision was deepened down to the level of the fascia. The fascia was opened and the sartorius muscle reflected posteriorly. The above-knee popliteal space was entered. Dissection was carried down to the level of the above-knee popliteal artery this was dissected free circumferentially.Several centimeters of the popliteal artery was dissected free to make a reasonable area for grafting of the above-knee popliteal distal anastomosis.    A subsartorial tunnel was created connecting the above-knee incision to the groin.  The patient was then given 10000 units of intravenous heparin. The greater saphenous vein was suture ligated distally and transected. The saphenofemoral junction was then oversewn with a 5-0 Prolene. The vein was gently distended with heparinized saline.  After appropriate circulation time of the heparin, the distal right external iliac artery was controlled with a small Cooley clamp. The profunda femoris and superficial femoral arteries were controlled with Vesseloops. A longitudinal opening was made in the common femoral artery just above the femoral bifurcation. The was then spatulated and placed in a non reversed configuration and sewn end of vein to side of artery using a running 5-0 Prolene suture. A valvulotome was then used to lyse all the valves and there was good pulsatile flow through the graft. Graft was marked for orientation.   The graft was then brought through the subsartorial tunnel down to the above-knee popliteal artery. The above-knee popliteal artery was controlled proximally with a Henley clamp and distally with a small Cooley clamp. A longitudinal opening was made in the distal above-knee popliteal artery in an area that was fairly free of calcification. The graft was then cut to length and spatulated and  signed end of graft to side of artery using running 6-0 Prolene suture.  At completion of the  anastomosis everything was forebled backbled and thoroughly flushed. The remainder of the anastomosis was completed and all clamps were removed restoring pulsatile flow to the above-knee popliteal artery.   An intraoperative arteriogram was then obtained using inflow occlusion. A 21-gauge butterfly needle was introduced in the proximal aspect of the graft and with the graft clamped proximally contrast was injected through the butterfly needle. This showed good opacification of the distal anastomosis which was patent. There was two vessel runoff via the anterior tibial and peroneal artery. The 21 gauge butterfly needle was then removed and the hole repaired with a single 6-0 Prolene suture. The patient had monophasic to biphasic Doppler flow in the dorsalis pedis area of the foot. This augmented approximately 75% with unclamping the graft.  After hemostasis was obtained, the deep layers and subcutaneous layers of the above-knee popliteal incision were closed with running 3-0 Vicryl suture. The skin was closed with a 4-0 Vicryl subcuticular stitch. The groin was inspected and found to be hemostatic. This was then closed in multiple layers of running 2 0 and 3-0 Vicryl suture and 4-0 subcuticular stitch. The patient tolerated the procedure well and there were no complications. Instrument sponge and needle counts correct in the case. Patient was taken to the recovery in stable condition.  [name], MD [facility] [place] Office: [phone] Pager: [phone]

## 2011-08-27 NOTE — Preoperative (Signed)
Beta Blockers   Reason not to administer Beta Blockers:Not Applicable 

## 2011-08-27 NOTE — Progress Notes (Signed)
ANTIBIOTIC CONSULT NOTE - INITIAL  Pharmacy Consult for vancomycin Indication: surgical prophylaxis  Allergies  Allergen Reactions  . Amoxicillin-Pot Clavulanate Shortness Of Breath  . Codeine     REACTION: swelling    Patient Measurements: Height: 5\' 8"  (172.7 cm) Weight: 238 lb (107.956 kg) IBW/kg (Calculated) : 63.9   Vital Signs: Temp: 97.5 F (36.4 C) (06/24 1345) Temp src: Oral (06/24 0617) BP: 134/77 mmHg (06/24 1352) Pulse Rate: 84  (06/24 1356) Intake/Output from previous day:   Intake/Output from this shift: Total I/O In: 2550 [I.V.:2300; IV Piggyback:250] Out: 1115 [Urine:1035; Blood:80]  Labs: No results found for this basename: WBC:3,HGB:3,PLT:3,LABCREA:3,CREATININE:3 in the last 72 hours Estimated Creatinine Clearance: 113.1 ml/min (by C-G formula based on Cr of 0.69). No results found for this basename: VANCOTROUGH:2,VANCOPEAK:2,VANCORANDOM:2,GENTTROUGH:2,GENTPEAK:2,GENTRANDOM:2,TOBRATROUGH:2,TOBRAPEAK:2,TOBRARND:2,AMIKACINPEAK:2,AMIKACINTROU:2,AMIKACIN:2, in the last 72 hours   Microbiology: Recent Results (from the past 720 hour(s))  SURGICAL PCR SCREEN     Status: Normal   Collection Time   08/21/11  9:52 AM      Component Value Range Status Comment   MRSA, PCR NEGATIVE  NEGATIVE Final    Staphylococcus aureus NEGATIVE  NEGATIVE Final     Medical History: Past Medical History  Diagnosis Date  . Hypertension   . Immune deficiency disorder   . PVD (peripheral vascular disease)   . PTSD (post-traumatic stress disorder)   . Diabetes mellitus   . Hyperlipidemia   . Depression, major   . Anxiety disorder   . Sleep apnea     no test done. dr says needs one  . H/O hiatal hernia   . Fibromyalgia   . Anemia     Medications:  Prescriptions prior to admission  Medication Sig Dispense Refill  . ALPRAZolam (XANAX) 1 MG tablet Take 1 mg by mouth 4 (four) times daily as needed.       Marland Kitchen buPROPion (WELLBUTRIN XL) 300 MG 24 hr tablet Take 300 mg by  mouth daily.      . citalopram (CELEXA) 20 MG tablet Take 20 mg by mouth daily.      . insulin glargine (LANTUS) 100 UNIT/ML injection Inject 60 Units into the skin at bedtime.      . insulin regular (NOVOLIN R,HUMULIN R) 100 units/mL injection Inject 20 Units into the skin 3 (three) times daily before meals.       . valsartan-hydrochlorothiazide (DIOVAN-HCT) 160-25 MG per tablet Take 1 tablet by mouth daily.        Assessment: 36 yof s/p right femoral to above-knee popliteal bypass + intraoperative arteriogram. She received a dose of vancomycin pre-op. Ordered vancomycin x 1 dose for surgical prophylaxis post-op.   Plan:  1. Vancomycin 1gm IV x 1 approximately 12 hours after pre-op dose 2. Pharmacy will sign-off. Please re-consult [place] if anything else is needed. Thank you for the consult!  Shaquill Iseman, Drake Leach 08/27/2011,2:46 PM

## 2011-08-27 NOTE — Transfer of Care (Signed)
Immediate Anesthesia Transfer of Care Note  Patient: Kelly Barber  Procedure(s) Performed: Procedure(s) (LRB): BYPASS GRAFT FEMORAL-POPLITEAL ARTERY (Right) ANGIOGRAM EXTREMITY RIGHT (Right)  Patient Location: PACU  Anesthesia Type: General  Level of Consciousness: awake, alert  and oriented  Airway & Oxygen Therapy: Patient Spontanous Breathing and Patient connected to nasal cannula oxygen  Post-op Assessment: Report given to PACU RN and Post -op Vital signs reviewed and stable  Post vital signs: Reviewed and stable  Complications: No apparent anesthesia complications

## 2011-08-28 DIAGNOSIS — Z48812 Encounter for surgical aftercare following surgery on the circulatory system: Secondary | ICD-10-CM

## 2011-08-28 DIAGNOSIS — Z09 Encounter for follow-up examination after completed treatment for conditions other than malignant neoplasm: Secondary | ICD-10-CM

## 2011-08-28 LAB — HEMOGLOBIN A1C
Hgb A1c MFr Bld: 13 % — ABNORMAL HIGH (ref <5.7)
Mean Plasma Glucose: 326 mg/dL — ABNORMAL HIGH (ref <117)

## 2011-08-28 LAB — BASIC METABOLIC PANEL
BUN: 12 mg/dL (ref 6–23)
CO2: 25 mEq/L (ref 19–32)
Calcium: 8.4 mg/dL (ref 8.4–10.5)
Chloride: 100 mEq/L (ref 96–112)
Creatinine, Ser: 0.71 mg/dL (ref 0.50–1.10)
GFR calc Af Amer: >90 mL/min (ref >90)
GFR calc non Af Amer: >90 mL/min (ref >90)
Glucose, Bld: 150 mg/dL — ABNORMAL HIGH (ref 70–99)
Potassium: 3.7 mEq/L (ref 3.5–5.1)
Sodium: 135 mEq/L (ref 135–145)

## 2011-08-28 LAB — GLUCOSE, CAPILLARY
Glucose-Capillary: 121 mg/dL — ABNORMAL HIGH (ref 70–99)
Glucose-Capillary: 198 mg/dL — ABNORMAL HIGH (ref 70–99)
Glucose-Capillary: 103 mg/dL — ABNORMAL HIGH (ref 70–99)
Glucose-Capillary: 159 mg/dL — ABNORMAL HIGH (ref 70–99)
Glucose-Capillary: 130 mg/dL — ABNORMAL HIGH (ref 70–99)

## 2011-08-28 LAB — CBC
HCT: 35.2 % — ABNORMAL LOW (ref 36.0–46.0)
Hemoglobin: 11.3 g/dL — ABNORMAL LOW (ref 12.0–15.0)
MCH: 30.2 pg (ref 26.0–34.0)
MCHC: 32.1 g/dL (ref 30.0–36.0)
MCV: 94.1 fL (ref 78.0–100.0)
Platelets: 262 10*3/uL (ref 150–400)
RBC: 3.74 MIL/uL — ABNORMAL LOW (ref 3.87–5.11)
RDW: 12.7 % (ref 11.5–15.5)
WBC: 8.2 10*3/uL (ref 4.0–10.5)

## 2011-08-28 MED ORDER — OXYCODONE HCL 5 MG PO TABS: 5.0000 mg | ORAL_TABLET | ORAL | Status: AC | PRN

## 2011-08-28 MED ORDER — OXYCODONE HCL 5 MG PO TABS
5.0000 mg | ORAL_TABLET | ORAL | Status: DC | PRN
  Administered 2011-08-28 – 2011-08-31 (×11): 5 mg via ORAL
  Filled 2011-08-28 (×10): qty 1

## 2011-08-28 NOTE — Progress Notes (Signed)
Utilization Review Completed.Shanty Ginty T6/25/2013   

## 2011-08-28 NOTE — Evaluation (Signed)
Physical Therapy Evaluation Patient Details Name: Kelly Barber MRN: [id number] DOB: 1965/07/03 Today's Date: 08/28/2011 Time: 0801-0820 PT Time Calculation (min): 19 min  PT Assessment / Plan / Recommendation Clinical Impression  pt presents s/p Fem-Pop.  pt moving well and notes sensation has returned to R foot.  No skilled PT needs at this time.  Will sign off.      PT Assessment  Patent does not need any further PT services    Follow Up Recommendations  No PT follow up    Barriers to Discharge        lEquipment Recommendations  None recommended by PT    Recommendations for Other Services     Frequency      Precautions / Restrictions Precautions Precautions: Fall Restrictions Weight Bearing Restrictions: No   Pertinent Vitals/Pain 6/10      Mobility  Bed Mobility Bed Mobility: Not assessed Transfers Transfers: Sit to Stand;Stand to Sit Sit to Stand: 7: Independent;With upper extremity assist;From chair/3-in-1;From toilet Stand to Sit: 7: Independent;With upper extremity assist;To chair/3-in-1;To toilet Details for Transfer Assistance: Demos safe technique Ambulation/Gait Ambulation/Gait Assistance: 7: Independent Ambulation Distance (Feet): 300 Feet Assistive device: None Ambulation/Gait Assistance Details: pt moving well and notes only minimal discomfort during mobility compared to pain she was experiencing PTA.   Gait Pattern: Step-through pattern;Antalgic (Mild Antalgic) Stairs: No Wheelchair Mobility Wheelchair Mobility: No      Visit Information  Last PT Received On: 08/28/11 Assistance Needed: +1 PT/OT Co-Evaluation/Treatment: Yes    Subjective Data  Subjective: This is much better than my last surgery.   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   Communication Communication: No difficulties    Cognition  Overall Cognitive Status: Appears within functional limits for tasks assessed/performed Arousal/Alertness: Awake/alert Orientation Level: Oriented X4 / Intact Behavior During Session: [facility] for tasks performed    Extremity/Trunk Assessment Right Lower Extremity Assessment RLE ROM/Strength/Tone: Deficits;Due to pain RLE ROM/Strength/Tone Deficits: AROM WFL, strength limited by surgical pain.   RLE Sensation: WFL - Light Touch Left Lower Extremity Assessment LLE ROM/Strength/Tone: WFL for tasks assessed LLE Sensation: WFL - Light Touch   Balance Balance Balance Assessed: No  End of Session PT - End of Session Equipment Utilized During Treatment:  (None) Activity Tolerance: Patient tolerated treatment well Patient left: in chair;with call bell/phone within reach Nurse Communication: Mobility status   Sunny Schlein, [place] [phone] 08/28/2011, 8:33 AM

## 2011-08-28 NOTE — Progress Notes (Signed)
Report called to RN on 2000, Pt went to ultrasound/radiology prior to transfer. All meds given aside from 20 units of Regular pt needs to eat first. VSS.

## 2011-08-28 NOTE — Progress Notes (Addendum)
[facility] Progress Note  08/28/2011 7:14 AM POD 1  Subjective:  Pt states that her pain is so much better from pre operatively.  She states that she is actually able to bend her toes, which she couldn't do pre op.  Tm 100.8    100-160s systolic   97% 2LO2NC Filed Vitals:   08/28/11 0400  BP: 119/73  Pulse: 101  Temp: 100.8 F (38.2 C)  Resp: 24    Physical Exam: Incisions:  Covered.  Will take dressings down tomorrow Extremities:  + doppler signal in the right DP/PT  CBC    Component Value Date/Time   WBC 8.2 08/28/2011 0404   RBC 3.74* 08/28/2011 0404   HGB 11.3* 08/28/2011 0404   HCT 35.2* 08/28/2011 0404   PLT 262 08/28/2011 0404   MCV 94.1 08/28/2011 0404   MCH 30.2 08/28/2011 0404   MCHC 32.1 08/28/2011 0404   RDW 12.7 08/28/2011 0404   LYMPHSABS 3.9 08/10/2008 1504   MONOABS 0.8 08/10/2008 1504   EOSABS 0.3 08/10/2008 1504   BASOSABS 0.0 08/10/2008 1504    BMET    Component Value Date/Time   NA 135 08/28/2011 0404   K 3.7 08/28/2011 0404   CL 100 08/28/2011 0404   CO2 25 08/28/2011 0404   GLUCOSE 150* 08/28/2011 0404   BUN 12 08/28/2011 0404   CREATININE 0.71 08/28/2011 0404   CALCIUM 8.4 08/28/2011 0404   GFRNONAA >90 08/28/2011 0404   GFRAA >90 08/28/2011 0404    INR    Component Value Date/Time   INR 0.91 08/21/2011 0953     Intake/Output Summary (Last 24 hours) at 08/28/11 0714 Last data filed at 08/28/11 0400  Gross per 24 hour  Intake   5010 ml  Output   4515 ml  Net    495 ml     Assessment/Plan:  46 y.o. female is s/p Right femoral to above-knee popliteal bypass, intraoperative arteriogram POD 1  -doing well this am. -she has good doppler flow in her right foot. -mobilize -continue IS -transfer to 2000 -will get diabetes coordinator to follow for DM as last HgbA1c was 10.1 in 2010. Will order another one this admission   Doreatha Massed, PA-C [facility] [phone] 08/28/2011 7:14 AM  Exam details as above  biphasic DP, monophasic PT Check ABI before d/c May be able to go home tomorrow if ambulates today D/c foley Transfer 2000  [name], MD [facility] [place] Office: [phone] Pager: [phone]

## 2011-08-28 NOTE — Evaluation (Signed)
Occupational Therapy Evaluation and Discharge Patient Details Name: Kelly Barber MRN: [id number] DOB: 1965-09-01 Today's Date: 08/28/2011 Time: 4098-1191 OT Time Calculation (min): 20 min  OT Assessment / Plan / Recommendation Clinical Impression  This 46 yo female s/p fem pop bypass presents to acute OT at an Independent level. No further OT needs, will sign off.    OT Assessment  Patient does not need any further OT services    Follow Up Recommendations  No OT follow up    Barriers to Discharge      Equipment Recommendations  None recommended by OT    Recommendations for Other Services    Frequency       Precautions / Restrictions Precautions Precautions: None Restrictions Weight Bearing Restrictions: No       ADL  Eating/Feeding: Simulated;Independent Where Assessed - Eating/Feeding: Chair Grooming: Simulated;Independent Where Assessed - Grooming: Unsupported standing Upper Body Bathing: Simulated;Independent Where Assessed - Upper Body Bathing: Unsupported sit to stand Lower Body Bathing: Simulated;Independent Where Assessed - Lower Body Bathing: Unsupported sit to stand Upper Body Dressing: Simulated;Independent Where Assessed - Upper Body Dressing: Unsupported sit to stand Lower Body Dressing: Performed;Independent Where Assessed - Lower Body Dressing: Unsupported sit to stand Toilet Transfer: Performed;Independent Toilet Transfer Method: Sit to Barista: Regular height toilet;Grab bars Toileting - Clothing Manipulation and Hygiene: Simulated;Independent Where Assessed - Toileting Clothing Manipulation and Hygiene: Standing Transfers/Ambulation Related to ADLs: Independent    OT Diagnosis:    OT Problem List:   OT Treatment Interventions:     OT Goals    Visit Information  Last OT Received On: 08/28/11 PT/OT Co-Evaluation/Treatment: Yes    Subjective Data  Subjective: I am so much better than I was last time Patient  Stated Goal: To go home tomorrow   Prior Functioning  Home Living Lives With: Daughter (sister available) Available Help at Discharge: Family;Available 24 hours/day Type of Home: Apartment Home Access: Level entry Home Layout: One level Bathroom Shower/Tub: Engineer, manufacturing systems: Standard Bathroom Accessibility: Yes How Accessible: Accessible via walker Home Adaptive Equipment: Shower chair with back;Straight cane Prior Function Level of Independence: Independent Able to Take Stairs?: Yes Driving: Yes Vocation: Full time employment 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 Communication Communication: No difficulties Dominant Hand: Right    Cognition  Overall Cognitive Status: Appears within functional limits for tasks assessed/performed Arousal/Alertness: Awake/alert Orientation Level: Oriented X4 / Intact Behavior During Session: WFL for tasks performed    Extremity/Trunk Assessment Right Upper Extremity Assessment RUE ROM/Strength/Tone: Within functional levels Left Upper Extremity Assessment LUE ROM/Strength/Tone: Within functional levels   Mobility Transfers Transfers: Sit to Stand;Stand to Sit Sit to Stand: 7: Independent;With upper extremity assist;From chair/3-in-1 Stand to Sit: 7: Independent;With upper extremity assist;With armrests;To chair/3-in-1   Exercise    Balance    End of Session OT - End of Session Equipment Utilized During Treatment:  (None) Activity Tolerance: Patient tolerated treatment well Patient left: in chair;with call bell/phone within reach   Evette Georges [phone] 08/28/2011, 12:23 PM

## 2011-08-28 NOTE — Progress Notes (Signed)
*  PRELIMINARY RESULTS* Vascular Ultrasound Lower Extremity Bypass Graft Duplex has been completed.  Right femoral popliteal vein graft was visualized with evidence of patency in the proximal segment, and absence of flow between the proximal and mid segments, and evidence of flow distally. The popliteal artery had brisk, monophasic flow.   ABI completed.   RIGHT    LEFT    PRESSURE WAVEFORM  PRESSURE WAVEFORM  BRACHIAL 117 Triphasic BRACHIAL 124 Triphasic  DP 55 Monophasic DP 115 Monophasic  AT   AT    PT 53 Monophasic PT 112 Biphasic  PER   PER    GREAT TOE  NA GREAT TOE  NA    RIGHT LEFT  ABI 0.44 0.93   The right ABI=0.44, which is indicative of severe disease. The left ABI=0.93 which is within normal limits.   08/28/2011 11:38 AM Elpidio Galea, RDMS, RDCS

## 2011-08-29 ENCOUNTER — Inpatient Hospital Stay (HOSPITAL_COMMUNITY): Payer: PRIVATE HEALTH INSURANCE

## 2011-08-29 ENCOUNTER — Inpatient Hospital Stay (HOSPITAL_COMMUNITY): Payer: PRIVATE HEALTH INSURANCE | Admitting: Anesthesiology

## 2011-08-29 ENCOUNTER — Encounter (HOSPITAL_COMMUNITY): Payer: Self-pay | Admitting: Anesthesiology

## 2011-08-29 ENCOUNTER — Encounter (HOSPITAL_COMMUNITY)
Admission: RE | Disposition: A | Discharge status: Home / Self Care | Payer: Self-pay | Source: Ambulatory Visit | Attending: Vascular Surgery

## 2011-08-29 ENCOUNTER — Telehealth: Payer: Self-pay | Admitting: Vascular Surgery

## 2011-08-29 DIAGNOSIS — T82898A Other specified complication of vascular prosthetic devices, implants and grafts, initial encounter: Secondary | ICD-10-CM

## 2011-08-29 HISTORY — PX: INTRAOPERATIVE ARTERIOGRAM: SHX5157

## 2011-08-29 HISTORY — PX: FEMORAL-POPLITEAL BYPASS GRAFT: SHX937

## 2011-08-29 LAB — GLUCOSE, CAPILLARY
Glucose-Capillary: 152 mg/dL — ABNORMAL HIGH (ref 70–99)
Glucose-Capillary: 104 mg/dL — ABNORMAL HIGH (ref 70–99)
Glucose-Capillary: 117 mg/dL — ABNORMAL HIGH (ref 70–99)
Glucose-Capillary: 125 mg/dL — ABNORMAL HIGH (ref 70–99)
Glucose-Capillary: 134 mg/dL — ABNORMAL HIGH (ref 70–99)

## 2011-08-29 SURGERY — BYPASS GRAFT FEMORAL-POPLITEAL ARTERY
Anesthesia: General | Site: Leg Upper | Laterality: Right | Wound class: Clean

## 2011-08-29 MED ORDER — PROPOFOL 10 MG/ML IV EMUL
INTRAVENOUS | Status: DC | PRN
  Administered 2011-08-29: 160 mg via INTRAVENOUS

## 2011-08-29 MED ORDER — IOHEXOL 300 MG/ML  SOLN
INTRAMUSCULAR | Status: DC | PRN
  Administered 2011-08-29: 50 mL via INTRAVENOUS

## 2011-08-29 MED ORDER — ONDANSETRON HCL 4 MG/2ML IJ SOLN
INTRAMUSCULAR | Status: DC | PRN
  Administered 2011-08-29: 4 mg via INTRAVENOUS

## 2011-08-29 MED ORDER — IOHEXOL 300 MG/ML  SOLN
INTRAMUSCULAR | Status: AC
  Filled 2011-08-29: qty 1

## 2011-08-29 MED ORDER — MIDAZOLAM HCL 5 MG/5ML IJ SOLN
INTRAMUSCULAR | Status: DC | PRN
  Administered 2011-08-29: 2 mg via INTRAVENOUS

## 2011-08-29 MED ORDER — LIDOCAINE HCL 4 % MT SOLN
OROMUCOSAL | Status: DC | PRN
  Administered 2011-08-29: 4 mL via TOPICAL

## 2011-08-29 MED ORDER — HYDROMORPHONE HCL PF 1 MG/ML IJ SOLN
INTRAMUSCULAR | Status: AC
  Filled 2011-08-29: qty 1

## 2011-08-29 MED ORDER — ONDANSETRON HCL 4 MG/2ML IJ SOLN: 4.0000 mg | Freq: Four times a day (QID) | INTRAMUSCULAR | Status: DC | PRN

## 2011-08-29 MED ORDER — LACTATED RINGERS IV SOLN
INTRAVENOUS | Status: DC
  Administered 2011-08-29: 14:00:00 via INTRAVENOUS

## 2011-08-29 MED ORDER — HETASTARCH-ELECTROLYTES 6 % IV SOLN
INTRAVENOUS | Status: DC | PRN
  Administered 2011-08-29: 15:00:00 via INTRAVENOUS

## 2011-08-29 MED ORDER — PHENYLEPHRINE HCL 10 MG/ML IJ SOLN
10.0000 mg | INTRAVENOUS | Status: DC | PRN
  Administered 2011-08-29: 40 ug/min via INTRAVENOUS

## 2011-08-29 MED ORDER — NEOSTIGMINE METHYLSULFATE 1 MG/ML IJ SOLN
INTRAMUSCULAR | Status: DC | PRN
  Administered 2011-08-29: 5 mg via INTRAVENOUS

## 2011-08-29 MED ORDER — LACTATED RINGERS IV SOLN
INTRAVENOUS | Status: DC | PRN
  Administered 2011-08-29: 14:00:00 via INTRAVENOUS

## 2011-08-29 MED ORDER — HEPARIN SODIUM (PORCINE) 1000 UNIT/ML IJ SOLN
INTRAMUSCULAR | Status: DC | PRN
  Administered 2011-08-29: 10000 [IU] via INTRAVENOUS

## 2011-08-29 MED ORDER — IOHEXOL 350 MG/ML SOLN
100.0000 mL | Freq: Once | INTRAVENOUS | Status: AC | PRN
  Administered 2011-08-29: 100 mL via INTRAVENOUS

## 2011-08-29 MED ORDER — MEPERIDINE HCL 25 MG/ML IJ SOLN: 6.2500 mg | INTRAMUSCULAR | Status: DC | PRN

## 2011-08-29 MED ORDER — FENTANYL CITRATE 0.05 MG/ML IJ SOLN
INTRAMUSCULAR | Status: DC | PRN
  Administered 2011-08-29 (×2): 100 ug via INTRAVENOUS
  Administered 2011-08-29: 50 ug via INTRAVENOUS

## 2011-08-29 MED ORDER — OXYCODONE HCL 5 MG PO TABS
ORAL_TABLET | ORAL | Status: AC
  Filled 2011-08-29: qty 2

## 2011-08-29 MED ORDER — MORPHINE SULFATE 4 MG/ML IJ SOLN
INTRAMUSCULAR | Status: AC
  Administered 2011-08-29: 4 mg
  Filled 2011-08-29: qty 1

## 2011-08-29 MED ORDER — ROCURONIUM BROMIDE 100 MG/10ML IV SOLN
INTRAVENOUS | Status: DC | PRN
  Administered 2011-08-29: 20 mg via INTRAVENOUS
  Administered 2011-08-29: 10 mg via INTRAVENOUS
  Administered 2011-08-29: 30 mg via INTRAVENOUS

## 2011-08-29 MED ORDER — LACTATED RINGERS IV SOLN
INTRAVENOUS | Status: DC | PRN
  Administered 2011-08-29 (×2): via INTRAVENOUS

## 2011-08-29 MED ORDER — 0.9 % SODIUM CHLORIDE (POUR BTL) OPTIME
TOPICAL | Status: DC | PRN
  Administered 2011-08-29: 1000 mL

## 2011-08-29 MED ORDER — VANCOMYCIN HCL IN DEXTROSE 1-5 GM/200ML-% IV SOLN
1000.0000 mg | INTRAVENOUS | Status: AC
  Filled 2011-08-29: qty 200

## 2011-08-29 MED ORDER — PROMETHAZINE HCL 25 MG/ML IJ SOLN: 6.2500 mg | INTRAMUSCULAR | Status: DC | PRN

## 2011-08-29 MED ORDER — SUCCINYLCHOLINE CHLORIDE 20 MG/ML IJ SOLN
INTRAMUSCULAR | Status: DC | PRN
  Administered 2011-08-29: 160 mg via INTRAVENOUS

## 2011-08-29 MED ORDER — KETOROLAC TROMETHAMINE 30 MG/ML IJ SOLN: 15.0000 mg | Freq: Once | INTRAMUSCULAR | Status: DC | PRN

## 2011-08-29 MED ORDER — VANCOMYCIN HCL 1000 MG IV SOLR
1000.0000 mg | INTRAVENOUS | Status: DC | PRN
  Administered 2011-08-29: 1000 mg via INTRAVENOUS

## 2011-08-29 MED ORDER — HYDROMORPHONE HCL PF 1 MG/ML IJ SOLN
0.2500 mg | INTRAMUSCULAR | Status: DC | PRN
  Administered 2011-08-29: 0.5 mg via INTRAVENOUS
  Administered 2011-08-29: 18:00:00 via INTRAVENOUS
  Administered 2011-08-29 (×2): 0.5 mg via INTRAVENOUS

## 2011-08-29 MED ORDER — GLYCOPYRROLATE 0.2 MG/ML IJ SOLN
INTRAMUSCULAR | Status: DC | PRN
  Administered 2011-08-29: .7 mg via INTRAVENOUS

## 2011-08-29 MED ORDER — SODIUM CHLORIDE 0.9 % IR SOLN
Status: DC | PRN
  Administered 2011-08-29: 15:00:00

## 2011-08-29 SURGICAL SUPPLY — 66 items
BANDAGE ESMARK 6X9 LF (GAUZE/BANDAGES/DRESSINGS) IMPLANT
BNDG ESMARK 6X9 LF (GAUZE/BANDAGES/DRESSINGS)
CANISTER SUCTION 2500CC (MISCELLANEOUS) ×2 IMPLANT
CATH EMB 4FR 80CM (CATHETERS) ×2 IMPLANT
CLIP TI MEDIUM 24 (CLIP) ×2 IMPLANT
CLIP TI WIDE RED SMALL 24 (CLIP) ×2 IMPLANT
CLOTH BEACON ORANGE TIMEOUT ST (SAFETY) ×2 IMPLANT
CORONARY SUCKER SOFT TIP 10052 (MISCELLANEOUS) IMPLANT
COVER SURGICAL LIGHT HANDLE (MISCELLANEOUS) ×2 IMPLANT
CUFF TOURNIQUET SINGLE 24IN (TOURNIQUET CUFF) IMPLANT
CUFF TOURNIQUET SINGLE 34IN LL (TOURNIQUET CUFF) IMPLANT
CUFF TOURNIQUET SINGLE 44IN (TOURNIQUET CUFF) IMPLANT
DECANTER SPIKE VIAL GLASS SM (MISCELLANEOUS) IMPLANT
DERMABOND ADVANCED (GAUZE/BANDAGES/DRESSINGS) ×3
DERMABOND ADVANCED .7 DNX12 (GAUZE/BANDAGES/DRESSINGS) ×3 IMPLANT
DRAIN SNY WOU (WOUND CARE) IMPLANT
DRAPE C-ARM 42X72 X-RAY (DRAPES) ×2 IMPLANT
DRAPE WARM FLUID 44X44 (DRAPE) ×2 IMPLANT
DRAPE X-RAY CASS 24X20 (DRAPES) ×4 IMPLANT
DRSG COVADERM 4X10 (GAUZE/BANDAGES/DRESSINGS) IMPLANT
DRSG COVADERM 4X8 (GAUZE/BANDAGES/DRESSINGS) IMPLANT
ELECT REM PT RETURN 9FT ADLT (ELECTROSURGICAL) ×2
ELECTRODE REM PT RTRN 9FT ADLT (ELECTROSURGICAL) ×1 IMPLANT
EVACUATOR SILICONE 100CC (DRAIN) IMPLANT
GLOVE BIO SURGEON STRL SZ 6.5 (GLOVE) ×6 IMPLANT
GLOVE BIO SURGEON STRL SZ7.5 (GLOVE) ×4 IMPLANT
GLOVE BIOGEL PI IND STRL 6.5 (GLOVE) ×1 IMPLANT
GLOVE BIOGEL PI IND STRL 7.0 (GLOVE) ×1 IMPLANT
GLOVE BIOGEL PI IND STRL 7.5 (GLOVE) ×1 IMPLANT
GLOVE BIOGEL PI INDICATOR 6.5 (GLOVE) ×1
GLOVE BIOGEL PI INDICATOR 7.0 (GLOVE) ×1
GLOVE BIOGEL PI INDICATOR 7.5 (GLOVE) ×1
GOWN PREVENTION PLUS XLARGE (GOWN DISPOSABLE) ×4 IMPLANT
GOWN STRL NON-REIN LRG LVL3 (GOWN DISPOSABLE) ×4 IMPLANT
KIT BASIN OR (CUSTOM PROCEDURE TRAY) ×2 IMPLANT
KIT ROOM TURNOVER OR (KITS) ×2 IMPLANT
MARKER GRAFT CORONARY BYPASS (MISCELLANEOUS) IMPLANT
MARKER SKIN DUAL TIP RULER LAB (MISCELLANEOUS) ×2 IMPLANT
NS IRRIG 1000ML POUR BTL (IV SOLUTION) ×4 IMPLANT
PACK PERIPHERAL VASCULAR (CUSTOM PROCEDURE TRAY) ×2 IMPLANT
PAD ARMBOARD 7.5X6 YLW CONV (MISCELLANEOUS) ×4 IMPLANT
PADDING CAST COTTON 6X4 STRL (CAST SUPPLIES) IMPLANT
SET COLLECT BLD 21X3/4 12 (NEEDLE) ×2 IMPLANT
SET MICROPUNCTURE 5F STIFF (MISCELLANEOUS) ×2 IMPLANT
SPONGE GAUZE 4X4 12PLY (GAUZE/BANDAGES/DRESSINGS) IMPLANT
SPONGE SURGIFOAM ABS GEL 100 (HEMOSTASIS) IMPLANT
STAPLER VISISTAT 35W (STAPLE) IMPLANT
STOPCOCK 4 WAY LG BORE MALE ST (IV SETS) ×2 IMPLANT
SUT PROLENE 5 0 C 1 24 (SUTURE) ×2 IMPLANT
SUT PROLENE 6 0 CC (SUTURE) ×2 IMPLANT
SUT PROLENE 7 0 BV 1 (SUTURE) ×2 IMPLANT
SUT PROLENE 7 0 BV1 MDA (SUTURE) IMPLANT
SUT SILK 2 0 SH (SUTURE) ×2 IMPLANT
SUT SILK 3 0 (SUTURE)
SUT SILK 3-0 18XBRD TIE 12 (SUTURE) IMPLANT
SUT VIC AB 2-0 CTX 36 (SUTURE) ×2 IMPLANT
SUT VIC AB 3-0 SH 27 (SUTURE) ×1
SUT VIC AB 3-0 SH 27X BRD (SUTURE) ×1 IMPLANT
SUT VIC AB 4-0 PS2 27 (SUTURE) ×2 IMPLANT
TAPE UMBILICAL COTTON 1/8X30 (MISCELLANEOUS) IMPLANT
TOWEL OR 17X24 6PK STRL BLUE (TOWEL DISPOSABLE) ×4 IMPLANT
TOWEL OR 17X26 10 PK STRL BLUE (TOWEL DISPOSABLE) ×4 IMPLANT
TRAY FOLEY CATH 14FRSI W/METER (CATHETERS) ×2 IMPLANT
TUBING EXTENTION W/L.L. (IV SETS) ×2 IMPLANT
UNDERPAD 30X30 INCONTINENT (UNDERPADS AND DIAPERS) ×2 IMPLANT
WATER STERILE IRR 1000ML POUR (IV SOLUTION) ×2 IMPLANT

## 2011-08-29 NOTE — Telephone Encounter (Addendum)
Message copied by Rosalyn Charters on Wed Aug 29, 2011  9:32 AM ------      Message from: [place], [place] K      Created: Wed Aug 29, 2011  8:57 AM      Regarding: schedule                   ----- Message -----         From: Dara Lords, PA         Sent: 08/29/2011   8:39 AM           To: Sharee Pimple, CMA            S/p right fem pop by CEF      F/u with him in 2 weeks.            Thanks,      Samantha  Pt. Notified of fu appt. With dr. Darrick Penna 09-13-11

## 2011-08-29 NOTE — Preoperative (Signed)
Beta Blockers   Reason not to administer Beta Blockers:Not Applicable 

## 2011-08-29 NOTE — Transfer of Care (Signed)
Immediate Anesthesia Transfer of Care Note  Patient: Kelly Barber  Procedure(s) Performed: Procedure(s) (LRB): BYPASS GRAFT FEMORAL-POPLITEAL ARTERY (Right) INTRA OPERATIVE ARTERIOGRAM (Right)  Patient Location: PACU  Anesthesia Type: General  Level of Consciousness: awake  Airway & Oxygen Therapy: Patient Spontanous Breathing and Patient connected to face mask oxygen  Post-op Assessment: Report given to PACU RN, Post -op Vital signs reviewed and stable, Patient moving all extremities and Patient moving all extremities X 4  Post vital signs: Reviewed and stable  Complications: No apparent anesthesia complications

## 2011-08-29 NOTE — Discharge Instructions (Signed)
Ankle-Brachial Index Test The Ankle-Brachial index is a test used to find peripheral vascular disease (PVD). PVD is also known as peripheral arterial disease (PAD). PVD is a blocking or hardening of the arteries anywhere within the circulatory system beyond the heart. The cause is cholesterol deposits within the blood vessels (atherosclerosis). These deposits cause arteries to narrow. The delivery of oxygen to tissues is impaired as a result. This can cause muscle pain and fatigue. This is called claudication.  PVD means there may also be build up of cholesterol in the:  Heart, increasing the risk for heart attacks.   Brain, increasing the risk for strokes.  This test measures the blood flow in the arms and legs. The test also determines if blood vessels are clogged by cholesterol deposits.  PROCEDURE  The test is done while you are lying down and resting. The arm (brachial) and ankle systolic pressure are measured. The measurements are taken two times on both sides. Systolic pressure is the pressure within the arteries when the heart pumps. The highest systolic pressure of the ankle is then divided by the highest arm systolic pressure. The result is the ankle-brachial pressure ratio or ABI. There should be a difference of less than 10 mm Hg. Sometimes this test is repeated after five minutes of exercising on a treadmill.  You may have leg pain during the treadmill portion of the test if you suffer from PAD. If the index number drops after exercise, this may show that PAD is present. NORMAL FINDINGS  ABIs above 0.95 are normal. Abnormal values are those less than 0.95.   The majority of patients with claudication have ABIs ranging from 0.3 to 0.9.   Leg pain at rest or severe arterial occlusive disease usually occurs with an ABI lower than 0.50.   Indexes lower than 0.20 are associated with ischemic or gangrenous extremities. These conditions severely hinder oxygen delivery to arms and legs.    In patients with diabetes and heavily calcified vessels, the arteries are often too hard to be squeezed. This results in a falsely elevated ankle pressure. Toe pressure in these patients may be a better indicator of blood flow.   If the ABI is positive, further evaluation in the form of angiography may be needed. Angiography may determine the location and severity of disease.   The ABI test is a simple and inexpensive means of finding arterial vascular disease.  Document Released: 02/24/2004 Document Revised: 02/08/2011 Document Reviewed: 04/16/2007 [facility] Patient Information 2012 [place], [place].

## 2011-08-29 NOTE — Progress Notes (Signed)
Right fem pop is occluded Will return to OR for thrombectomy possible revision  Periop Vancomycin Consent  [name], MD [facility] [place] Office: [phone] Pager: [phone]

## 2011-08-29 NOTE — Addendum Note (Signed)
Addendum  created 08/29/11 1750 by Raiford Simmonds, MD   Modules edited:Orders, PRL Based Order Sets

## 2011-08-29 NOTE — Anesthesia Preprocedure Evaluation (Signed)
Anesthesia Evaluation  Patient identified by MRN, date of birth, ID band  Reviewed: Allergy & Precautions, NPO status , Patient's Chart, lab work & pertinent test results  History of Anesthesia Complications Negative for: history of anesthetic complications  Airway Mallampati: II  Neck ROM: Full    Dental   Pulmonary sleep apnea ,  breath sounds clear to auscultation        Cardiovascular hypertension, Rhythm:Regular Rate:Normal     Neuro/Psych  Neuromuscular disease    GI/Hepatic hiatal hernia, GERD-  ,  Endo/Other  Diabetes mellitus-  Renal/GU      Musculoskeletal  (+) Fibromyalgia -  Abdominal (+) + obese,   Peds  Hematology   Anesthesia Other Findings   Reproductive/Obstetrics                           Anesthesia Physical Anesthesia Plan  ASA: III and Emergent  Anesthesia Plan: General   Post-op Pain Management:    Induction: Intravenous  Airway Management Planned: Oral ETT  Additional Equipment:   Intra-op Plan:   Post-operative Plan: Extubation in OR  Informed Consent: I have reviewed the patients History and Physical, chart, labs and discussed the procedure including the risks, benefits and alternatives for the proposed anesthesia with the patient or authorized representative who has indicated his/her understanding and acceptance.   Dental advisory given  Plan Discussed with: CRNA and Surgeon  Anesthesia Plan Comments:         Anesthesia Quick Evaluation

## 2011-08-29 NOTE — Discharge Summary (Signed)
[facility] Discharge Summary  Kelly Barber [birth year] 46 y.o. female  [id number]  Admission Date: 08/27/2011  Discharge Date: 08/31/11  Physician: Kelly Kerns, MD  Admission Diagnosis: PVD WITH ULCER   HPI:   This is a 46 y.o. female patient of Dr. Darrick Barber that is scheduled for a femoropopliteal bypass on Monday, June 24. The patient walked into the office today stating that she had been to the hospital for her preoperative workup and came in to be seen for "bleeding" in her right foot. The patient tells me she sees blood on her sock when she removes it however there is no active bleeding now. The patient states at the end of our conversation regarding her current problem that she is "eating that pain medicine like tic tac's", and may call in the next day or so for another refill. The last refill was June 13 per Dr. Darrick Barber.  Review of Systems: 12 point review of systems is notable for the difficulties described above otherwise unremarkable  Hospital Course:  The patient was admitted to the hospital and taken to the operating room on 08/27/2011 and underwent Right femoral to above-knee popliteal bypass, intraoperative arteriogram.  The pt tolerated the procedure well and was transported to the PACU in good condition. By POD 1, she was doing well and was transferred to the telemetry unit.  On POD 2, her ABIs revealed Right femoral popliteal vein graft was visualized with evidence of patency in the proximal segment, and absence of flow between the proximal and mid segments, and evidence of flow distally. The popliteal artery had brisk, monophasic flow. The ABI for the right was 0.44.  She was then sent for a CTA of the lower extremity to check the patency of the graft.   It revealed the graft was occluded.  She was taken back to the OR that afternoon and underwent thrombectomy with vein patch angioplasty of the bypass graft.  She tolerated the procedure well and was  transferred to the recovery room in stable condition.  By POD 4/2, she was doing well.  Her ABI's were improved and she was feeling much better.  (See ABI results below).  The remainder of the hospital course consisted of increasing mobilization and increasing intake of solids without difficulty.  CBC    Component Value Date/Time   WBC 8.2 08/28/2011 0404   RBC 3.74* 08/28/2011 0404   HGB 11.3* 08/28/2011 0404   HCT 35.2* 08/28/2011 0404   PLT 262 08/28/2011 0404   MCV 94.1 08/28/2011 0404   MCH 30.2 08/28/2011 0404   MCHC 32.1 08/28/2011 0404   RDW 12.7 08/28/2011 0404   LYMPHSABS 3.9 08/10/2008 1504   MONOABS 0.8 08/10/2008 1504   EOSABS 0.3 08/10/2008 1504   BASOSABS 0.0 08/10/2008 1504    BMET    Component Value Date/Time   NA 135 08/28/2011 0404   K 3.7 08/28/2011 0404   CL 100 08/28/2011 0404   CO2 25 08/28/2011 0404   GLUCOSE 150* 08/28/2011 0404   BUN 12 08/28/2011 0404   CREATININE 0.71 08/28/2011 0404   CALCIUM 8.4 08/28/2011 0404   GFRNONAA >90 08/28/2011 0404   GFRAA >90 08/28/2011 0404   HGBA1c:  13  Discharge Instructions:   The patient is discharged to home with extensive instructions on wound care and progressive ambulation.  They are instructed not to drive or perform any heavy lifting until returning to see the physician in his office.  Discharge Orders  Future Orders Please Complete By Expires   Resume previous diet      Driving Restrictions      Comments:   No driving for 2 weeks   Lifting restrictions      Comments:   No lifting for 6 weeks   Call MD for:  temperature >100.5      Call MD for:  redness, tenderness, or signs of infection (pain, swelling, bleeding, redness, odor or green/yellow discharge around incision site)      Call MD for:  severe or increased pain, loss or decreased feeling  in affected limb(s)      Discharge wound care:      Comments:   Shower daily with soap and water starting 08/30/11      Discharge Diagnosis:  PVD WITH ULCER  Secondary  Diagnosis: Patient Active Problem List  Diagnosis  . GOITER, MULTINODULAR  . DIABETES MELLITUS, TYPE II  . VASCULAR PURPURA  . ANXIETY  . DEPRESSION  . HYPERTENSION  . PERIPHERAL VASCULAR DISEASE  . LEUKOCYTOCLASTIC VASCULITIS  . GERD  . GALLSTONES  . CELLULITIS AND ABSCESS OF TRUNK  . FIBROMYALGIA  . WEIGHT GAIN  . CHEST PAIN, NON-CARDIAC  . LOWER LIMB AMPUTATION, OTHER TOE  . Atherosclerosis of native arteries of the extremities with intermittent claudication  . Preop cardiovascular exam   Past Medical History  Diagnosis Date  . Hypertension   . Immune deficiency disorder   . PVD (peripheral vascular disease)   . PTSD (post-traumatic stress disorder)   . Diabetes mellitus   . Hyperlipidemia   . Depression, major   . Anxiety disorder   . Sleep apnea     no test done. dr says needs one  . H/O hiatal hernia   . Fibromyalgia   . Anemia       [name], [name]  Home Medication Instructions [id number]   Printed on:08/29/11 0840  Medication Information                    buPROPion (WELLBUTRIN XL) 300 MG 24 hr tablet Take 300 mg by mouth daily.           insulin glargine (LANTUS) 100 UNIT/ML injection Inject 60 Units into the skin at bedtime.           insulin regular (NOVOLIN R,HUMULIN R) 100 units/mL injection Inject 20 Units into the skin 3 (three) times daily before meals.            valsartan-hydrochlorothiazide (DIOVAN-HCT) 160-25 MG per tablet Take 1 tablet by mouth daily.            ALPRAZolam (XANAX) 1 MG tablet Take 1 mg by mouth 4 (four) times daily as needed.            citalopram (CELEXA) 20 MG tablet Take 20 mg by mouth daily.           oxyCODONE (OXY IR/ROXICODONE) 5 MG immediate release tablet Take 1 tablet (5 mg total) by mouth every 4 (four) hours as needed. #30 NR           Vascular Ultrasound - 08/28/11  Lower Extremity Bypass Graft Duplex has been completed.  Right femoral popliteal vein graft was visualized with evidence of patency  in the proximal segment, and absence of flow between the proximal and mid segments, and evidence of flow distally. The popliteal artery had brisk, monophasic flow.   ABI completed.   RIGHT    LEFT  PRESSURE  WAVEFORM   PRESSURE  WAVEFORM   BRACHIAL  117  Triphasic  BRACHIAL  124  Triphasic   DP  55  Monophasic  DP  115  Monophasic   AT    AT     PT  53  Monophasic  PT  112  Biphasic   PER    PER     GREAT TOE   NA  GREAT TOE   NA     RIGHT  LEFT   ABI  0.44  0.93   The right ABI=0.44, which is indicative of severe disease. The left ABI=0.93 which is within normal limits.  ABI 08/30/11:  RIGHT    LEFT     PRESSURE  WAVEFORM   PRESSURE  WAVEFORM   BRACHIAL  120  Triphasic  BRACHIAL  103  Triphasic   DP  83  Monophasic  DP  114  Monophasic   AT    AT     PT  94  Monophasic  PT  101  Biphasic   PER    PER     GREAT TOE   NA  GREAT TOE   NA     RIGHT  LEFT   ABI  0.78  0.95   Right ABI= 0.78, which is increased since the last ABI (08/28/11) which was 0.44.  Left ABI= 0.95, which is within normal limits.  Disposition: home  Patient's condition: is Good  Follow up: 1. Dr. Darrick Barber in 2 weeks   Doreatha Massed, PA-C [facility] [phone] 08/29/2011  8:40 AM

## 2011-08-29 NOTE — Care Management Note (Signed)
    Page 1 of 1   08/31/2011     11:00:44 AM   CARE MANAGEMENT NOTE 08/31/2011  Patient:  Kelly Barber, Kelly Barber   Account Number:  [id number]  Date Initiated:  08/29/2011  Documentation initiated by:  SIMMONS,CRYSTAL  Subjective/Objective Assessment:   ADMITTED WITH PVD; LIVES AT HOME WITH KIDS; WAS IPTA- STILL WORKS.     Action/Plan:   DISCHARGE PLANNING DISCUSSED AT BEDSIDE.   Anticipated DC Date:  08/30/2011   Anticipated DC Plan:  HOME/SELF CARE      DC Planning Services  CM consult      Choice offered to / List presented to:             Status of service:  Completed, signed off Medicare Important Message given?   (If response is "NO", the following Medicare IM given date fields will be blank) Date Medicare IM given:   Date Additional Medicare IM given:    Discharge Disposition:  HOME/SELF CARE  Per UR Regulation:  Reviewed for med. necessity/level of care/duration of stay  If discussed at Long Length of Stay Meetings, dates discussed:    Comments:  08/31/11- 1100- Donn Pierini RN, BSN [phone] Pt for discharge home today, no further d/c needs.  08/29/11  1039  CRYSTAL SIMMONS RN, BSN [phone] DISCUSSED BUYING A DIFFERENT GLUCOMETER THAT REQUIRES CHEAPER STRIPS WITH PT; SHE VERBALIZED UNDERSTANDING.

## 2011-08-29 NOTE — Progress Notes (Addendum)
[facility] Progress Note  08/29/2011 8:35 AM POD 2  Subjective:  Still having a little pain.  Tm 100.4 now 99.7   97%RA Filed Vitals:   08/28/11 2111  BP: 107/63  Pulse: 99  Temp: 99.7 F (37.6 C)  Resp: 19    Physical Exam: Incisions:  C/d/i.  Staples in tact on thigh incision. Extremities:  Good doppler flow in the right DP/PT - improved from yesterday.  CBC    Component Value Date/Time   WBC 8.2 08/28/2011 0404   RBC 3.74* 08/28/2011 0404   HGB 11.3* 08/28/2011 0404   HCT 35.2* 08/28/2011 0404   PLT 262 08/28/2011 0404   MCV 94.1 08/28/2011 0404   MCH 30.2 08/28/2011 0404   MCHC 32.1 08/28/2011 0404   RDW 12.7 08/28/2011 0404   LYMPHSABS 3.9 08/10/2008 1504   MONOABS 0.8 08/10/2008 1504   EOSABS 0.3 08/10/2008 1504   BASOSABS 0.0 08/10/2008 1504    BMET    Component Value Date/Time   NA 135 08/28/2011 0404   K 3.7 08/28/2011 0404   CL 100 08/28/2011 0404   CO2 25 08/28/2011 0404   GLUCOSE 150* 08/28/2011 0404   BUN 12 08/28/2011 0404   CREATININE 0.71 08/28/2011 0404   CALCIUM 8.4 08/28/2011 0404   GFRNONAA >90 08/28/2011 0404   GFRAA >90 08/28/2011 0404    INR    Component Value Date/Time   INR 0.91 08/21/2011 0953     Intake/Output Summary (Last 24 hours) at 08/29/11 0835 Last data filed at 08/28/11 1700  Gross per 24 hour  Intake    240 ml  Output      0 ml  Net    240 ml   Vascular Ultrasound   08/28/11  Lower Extremity Bypass Graft Duplex has been completed.  Right femoral popliteal vein graft was visualized with evidence of patency in the proximal segment, and absence of flow between the proximal and mid segments, and evidence of flow distally. The popliteal artery had brisk, monophasic flow.  ABI completed.   RIGHT    LEFT     PRESSURE  WAVEFORM   PRESSURE  WAVEFORM   BRACHIAL  117  Triphasic  BRACHIAL  124  Triphasic   DP  55  Monophasic  DP  115  Monophasic   AT    AT     PT  53  Monophasic  PT  112  Biphasic   PER    PER     GREAT  TOE   NA  GREAT TOE   NA     RIGHT  LEFT   ABI  0.44  0.93   The right ABI=0.44, which is indicative of severe disease. The left ABI=0.93 which is within normal limits.   Assessment/Plan:  46 y.o. female is s/p Right femoral to above-knee popliteal bypass, intraoperative arteriogram POD 2 -doing well this am.   -ambulating well. -needs assistance with her glucose strips at home.  States they were over $100 before she came in and she doesn't get paid until Friday. -HgbA1c is 13, which is up from 10 in 2010.  Will get DM coordinator to see.   Doreatha Massed, PA-C [facility] [phone] 08/29/2011 8:35 AM   Overall feels foot is better, her foot is warm on my exam and she has brisk biphasic PT and DP doppler flow.  However, ABI yesterday was 0.4 compared to 0.9 on left.  Will keep NPO for now.  CTA  right leg today to check patency. If graft patent d/c home today If occluded will need return to OR.  [name], MD [facility] Office: [phone] Pager: [phone]

## 2011-08-29 NOTE — Progress Notes (Signed)
Inpatient Diabetes Program Recommendations  AACE/ADA: New Consensus Statement on Inpatient Glycemic Control  Target Ranges:  Prepandial:   less than 140 mg/dL      Peak postprandial:   less than 180 mg/dL (1-2 hours)      Critically ill patients:  140 - 180 mg/dL  Pager:  [phone] Hours:  8 am-10pm   Reason for Visit: Consult for Elevated HgbA1C:   13 %   Patient glucose within great range while inpatient on home regimen.  Therefore patient is either not taking insulin as prescribed or eating poorly.  Have ordered outpatient diabetes education for patient to follow-up at discharge as well as inpatient education for diabetes and nutrition.  Case management consult also ordered.  Would not recommend any changes to regimen.    Alfredia Client PhD, RN Diabetes Coordinator  Office: [phone] Team Pager:  [phone]

## 2011-08-29 NOTE — Op Note (Signed)
Procedure: Thrombectomy Right femoral to above-knee popliteal bypass, vein patch angioplasty intraoperative arteriogram  Preoperative diagnosis: Thrombosed right fem pop bypass  Postoperative diagnosis: Same Anesthesia: General  Asst.: Leonides Sake, MD, Doreatha Massed, PA-C  Operative findings: #1 Retained valve proximal portion of graft          #2 Patent above-knee popliteal anastomosis Operative details: After obtaining informed consent, the patient was taken to the operating room. The patient was placed in supine position on the operating room table. After induction of general anesthesia and endotracheal intubation, a Foley catheter was placed. Next, the patient's entire right lower extremity was prepped and draped in the usual sterile fashion. A longitudinal incision was then made in the right groin and carried down through the subcutaneous tissues to expose the right common femoral artery. The common femoral artery was dissected free circumferentially. There was a pulse within the common femoral artery. The distal external iliac artery was dissected free circumferentially underneath the inguinal ligament. A vessel loop was also placed around the distal external iliac artery. Dissection was then carried out the level of the femoral bifurcation. The superficial femoral and profunda femoris arteries were dissected free circumferentially and vessel loops placed around these.   After appropriate circulation time of the heparin, the distal right external iliac artery was controlled with a small Henley clamp. The profunda femoris and superficial femoral arteries were controlled with Vesseloops. A longitudinal opening was made in the vein graft approximately 2 cm distal to the proximal anastomosis. There was clot at the level of a partially lysed valve but this was obviously obstructing flow.  There was excellent proximal inflow.  A #4 Fogarty catheter was then passed distally up to 70 cm several times until all  thrombus was removed, two clean passes were obtained, and there was good backbleeding.  This was then thoroughly flushed with heparinized saline.    A piece of the right greater saphenous vein was harvested through the medial right leg at a prior vein harvest site at the popliteal incision. The vein was of good quality approximately 3 mm and uniform diameter throughout its course. Side branches were ligated and divided between silk ties or clips. This was then opened longitudinally and reversed and sewn on as a patch angioplasty using a running 6 0 Prolene.  Just prior to completion, everything was forebled backbled and thoroughly flushed.  An intraoperative arteriogram was then obtained. A micropuncture sheath was introduced in the proximal aspect of the graft and contrast was injected. This showed good opacification of the distal anastomosis which was patent. The runoff was not well visualized due to dilution. A second angio was performed of the proximal aspect of the graft to make sure there were no technical problems proximally.  The micropuncture sheath was then removed and the hole repaired with a single 6-0 Prolene suture. The patient had biphasic to triphasic Doppler flow in the dorsalis pedis area of the foot. This augmented approximately 80% with unclamping the graft.    The groin was inspected and found to be hemostatic. This was then closed in multiple layers of running 2 0 and 3-0 Vicryl suture and 4-0 subcuticular stitch. The patient tolerated the procedure well and there were no complications. Instrument sponge and needle counts correct in the case. Patient was taken to the recovery in stable condition. The patient had a palpable DP pulse at the end of the case.  [name], MD  [facility] [place]  Office: [phone]  Pager: [phone]

## 2011-08-29 NOTE — Anesthesia Postprocedure Evaluation (Signed)
Anesthesia Post Note  Patient: Kelly Barber  Procedure(s) Performed: Procedure(s) (LRB): BYPASS GRAFT FEMORAL-POPLITEAL ARTERY (Right) INTRA OPERATIVE ARTERIOGRAM (Right)  Anesthesia type: General  Patient location: PACU  Post pain: Pain level controlled and Adequate analgesia  Post assessment: Post-op Vital signs reviewed, Patient's Cardiovascular Status Stable, Respiratory Function Stable, Patent Airway and Pain level controlled  Last Vitals:  Filed Vitals:   08/29/11 1656  BP:   Pulse:   Temp: 36.4 C  Resp:     Post vital signs: Reviewed and stable  Level of consciousness: awake, alert  and oriented  Complications: No apparent anesthesia complications

## 2011-08-30 ENCOUNTER — Encounter (HOSPITAL_COMMUNITY): Payer: Self-pay | Admitting: Vascular Surgery

## 2011-08-30 DIAGNOSIS — Z48812 Encounter for surgical aftercare following surgery on the circulatory system: Secondary | ICD-10-CM

## 2011-08-30 LAB — CBC
HCT: 33 % — ABNORMAL LOW (ref 36.0–46.0)
Hemoglobin: 10.6 g/dL — ABNORMAL LOW (ref 12.0–15.0)
MCH: 29.9 pg (ref 26.0–34.0)
MCHC: 32.1 g/dL (ref 30.0–36.0)
MCV: 93.2 fL (ref 78.0–100.0)
Platelets: 213 10*3/uL (ref 150–400)
RBC: 3.54 MIL/uL — ABNORMAL LOW (ref 3.87–5.11)
RDW: 12.7 % (ref 11.5–15.5)
WBC: 9 10*3/uL (ref 4.0–10.5)

## 2011-08-30 LAB — GLUCOSE, CAPILLARY
Glucose-Capillary: 164 mg/dL — ABNORMAL HIGH (ref 70–99)
Glucose-Capillary: 124 mg/dL — ABNORMAL HIGH (ref 70–99)
Glucose-Capillary: 77 mg/dL (ref 70–99)
Glucose-Capillary: 251 mg/dL — ABNORMAL HIGH (ref 70–99)

## 2011-08-30 LAB — BASIC METABOLIC PANEL
BUN: 11 mg/dL (ref 6–23)
CO2: 29 mEq/L (ref 19–32)
Calcium: 8.5 mg/dL (ref 8.4–10.5)
Chloride: 100 mEq/L (ref 96–112)
Creatinine, Ser: 0.88 mg/dL (ref 0.50–1.10)
GFR calc Af Amer: 90 mL/min — ABNORMAL LOW (ref >90)
GFR calc non Af Amer: 78 mL/min — ABNORMAL LOW (ref >90)
Glucose, Bld: 138 mg/dL — ABNORMAL HIGH (ref 70–99)
Potassium: 4.1 mEq/L (ref 3.5–5.1)
Sodium: 136 mEq/L (ref 135–145)

## 2011-08-30 NOTE — Progress Notes (Addendum)
[facility] Progress Note  08/30/2011 7:19 AM POD 3/1  Subjective:  Feeling better this am.  States her toes feel better this morning.  She states that she has already walked this morning.  Afebrile x 24 hours  VSS Filed Vitals:   08/30/11 0425  BP: 112/67  Pulse: 94  Temp: 98.8 F (37.1 C)  Resp:     Physical Exam: Incisions:  Distal incisions are c/d/i.  Will leave bandage on groin incision until tomorrow morning. Extremities:  + doppler signal right DP.  Cannot get a doppler signal in the right PT  CBC    Component Value Date/Time   WBC 9.0 08/30/2011 0520   RBC 3.54* 08/30/2011 0520   HGB 10.6* 08/30/2011 0520   HCT 33.0* 08/30/2011 0520   PLT 213 08/30/2011 0520   MCV 93.2 08/30/2011 0520   MCH 29.9 08/30/2011 0520   MCHC 32.1 08/30/2011 0520   RDW 12.7 08/30/2011 0520   LYMPHSABS 3.9 08/10/2008 1504   MONOABS 0.8 08/10/2008 1504   EOSABS 0.3 08/10/2008 1504   BASOSABS 0.0 08/10/2008 1504    BMET    Component Value Date/Time   NA 136 08/30/2011 0520   K 4.1 08/30/2011 0520   CL 100 08/30/2011 0520   CO2 29 08/30/2011 0520   GLUCOSE 138* 08/30/2011 0520   BUN 11 08/30/2011 0520   CREATININE 0.88 08/30/2011 0520   CALCIUM 8.5 08/30/2011 0520   GFRNONAA 78* 08/30/2011 0520   GFRAA 90* 08/30/2011 0520    INR    Component Value Date/Time   INR 0.91 08/21/2011 0953     Intake/Output Summary (Last 24 hours) at 08/30/11 0719 Last data filed at 08/30/11 0000  Gross per 24 hour  Intake   3090 ml  Output   1825 ml  Net   1265 ml     Assessment/Plan:  46 y.o. female is s/p Right femoral to above-knee popliteal bypass, intraoperative arteriogram/Thrombectomy Right femoral to above-knee popliteal bypass, vein patch angioplasty intraoperative arteriogram    POD 3/1  -doing well this am. -ABI's this am. -discontinue foley -acute surgical blood loss anemia. -continue to mobilize -transfer to 2000 -good glucose management 125-130's  Doreatha Massed,  PA-C [facility] [phone] 08/30/2011 7:19 AM  Right foot warm, vaguely palpable DP pulse Biphasic DP, Monophasic PT peroneal  D/c foley Transfer 2000 Repeat ABI today If ABI reasonable d/c tomorrow  [name], MD [facility] [place] Office: [phone] Pager: [phone]

## 2011-08-30 NOTE — Progress Notes (Signed)
VASCULAR LAB PRELIMINARY  ARTERIAL  ABI completed:    RIGHT    LEFT    PRESSURE WAVEFORM  PRESSURE WAVEFORM  BRACHIAL 120 Triphasic BRACHIAL 103 Triphasic  DP 83 Monophasic DP 114 Monophasic  AT   AT    PT 94 Monophasic PT 101 Biphasic  PER   PER    GREAT TOE  NA GREAT TOE  NA    RIGHT LEFT  ABI 0.78 0.95   Right ABI= 0.78, which is increased since the last ABI (08/28/11) which was 0.44. Left ABI= 0.95, which is within normal limits.   08/30/2011 12:30 PM Elpidio Galea, RDMS, RDCS

## 2011-08-30 NOTE — Plan of Care (Signed)
Problem: Limited Adherence to Nutrition-Related Recommendations (NB-1.6) Goal: Nutrition education Formal process to instruct or train a patient/client in a skill or to impart knowledge to help patients/clients voluntarily manage or modify food choices and eating behavior to maintain or improve health.  Outcome: Completed/Met Date Met:  08/30/11 Consult received for diabetes diet education.  Chart reviewed.  Patient is obese with BMI=36.3 (class 2 obesity).  Patient reports that she usually eats a lot of bread, pasta, rice, and fruit juice.  These are her favorite foods.  She has had education in the past and knows that she needs to follow a diet, but has not been doing that recently.  She is a single mom and has limited funds to purchase her diabetes medications and test strips for her glucometer.  Discussed CHO-counting, portion sizes, and general basic diabetes diet guidelines.  Patient seemed receptive and motivated to make changes to her diet at home so she would not have to have anymore similar surgeries in the future.  Handouts provided with RD contact information for any future questions.  Feel patient would benefit from OP education after discharge home for further reinforcement.    Joaquin Courts, RD, CNSC, LDN Pager# [phone] After Hours Pager# [phone]

## 2011-08-31 ENCOUNTER — Encounter (HOSPITAL_COMMUNITY): Payer: Self-pay | Admitting: Vascular Surgery

## 2011-08-31 LAB — GLUCOSE, CAPILLARY: Glucose-Capillary: 195 mg/dL — ABNORMAL HIGH (ref 70–99)

## 2011-08-31 NOTE — Progress Notes (Signed)
Pt d/c home per MD order, d/c instructions given,prescriptions given, family at [facility], pt and family verbalized understanding of D/C, all questions answered

## 2011-08-31 NOTE — Progress Notes (Addendum)
[facility] Progress Note  08/31/2011 7:13 AM POD 4/2  Subjective:  Feeling better this am  Tm 100 now 98.9  Otherwise VSS  100%2LO2NC Filed Vitals:   08/31/11 0434  BP: 117/71  Pulse: 95  Temp:   Resp: 15    Physical Exam: Incisions:  All are c/d/i Extremities:  RLE warm and well perfused.  She has a good doppler DP signal.  There is a faint doppler signal in the right PT this am, which was not present yesterday.  CBC    Component Value Date/Time   WBC 9.0 08/30/2011 0520   RBC 3.54* 08/30/2011 0520   HGB 10.6* 08/30/2011 0520   HCT 33.0* 08/30/2011 0520   PLT 213 08/30/2011 0520   MCV 93.2 08/30/2011 0520   MCH 29.9 08/30/2011 0520   MCHC 32.1 08/30/2011 0520   RDW 12.7 08/30/2011 0520   LYMPHSABS 3.9 08/10/2008 1504   MONOABS 0.8 08/10/2008 1504   EOSABS 0.3 08/10/2008 1504   BASOSABS 0.0 08/10/2008 1504    BMET    Component Value Date/Time   NA 136 08/30/2011 0520   K 4.1 08/30/2011 0520   CL 100 08/30/2011 0520   CO2 29 08/30/2011 0520   GLUCOSE 138* 08/30/2011 0520   BUN 11 08/30/2011 0520   CREATININE 0.88 08/30/2011 0520   CALCIUM 8.5 08/30/2011 0520   GFRNONAA 78* 08/30/2011 0520   GFRAA 90* 08/30/2011 0520    INR    Component Value Date/Time   INR 0.91 08/21/2011 0953     Intake/Output Summary (Last 24 hours) at 08/31/11 0713 Last data filed at 08/31/11 0500  Gross per 24 hour  Intake   2060 ml  Output    450 ml  Net   1610 ml    RIGHT    LEFT     PRESSURE  WAVEFORM   PRESSURE  WAVEFORM   BRACHIAL  120  Triphasic  BRACHIAL  103  Triphasic   DP  83  Monophasic  DP  114  Monophasic   AT    AT     PT  94  Monophasic  PT  101  Biphasic   PER    PER     GREAT TOE   NA  GREAT TOE   NA     RIGHT  LEFT   ABI  0.78  0.95   Right ABI= 0.78, which is increased since the last ABI (08/28/11) which was 0.44.   Assessment/Plan:  46 y.o. female is s/p Right femoral to above-knee popliteal bypass, intraoperative arteriogram/Thrombectomy Right femoral to  above-knee popliteal bypass, vein patch angioplasty intraoperative arteriogram  POD 4/2  -doing well. -ambulating -d/c home today as ABI's are improved and pt is doing well.  Doreatha Massed, PA-C [facility] [phone] 08/31/2011 7:13 AM   Agree with above D/c home  [name], MD [facility] [place] Office: [phone] Pager: [phone]

## 2011-09-12 ENCOUNTER — Encounter: Payer: Self-pay | Admitting: Vascular Surgery

## 2011-09-13 ENCOUNTER — Encounter: Payer: Self-pay | Admitting: Vascular Surgery

## 2011-09-13 ENCOUNTER — Ambulatory Visit (INDEPENDENT_AMBULATORY_CARE_PROVIDER_SITE_OTHER): Payer: PRIVATE HEALTH INSURANCE | Admitting: Vascular Surgery

## 2011-09-13 ENCOUNTER — Encounter (INDEPENDENT_AMBULATORY_CARE_PROVIDER_SITE_OTHER): Payer: PRIVATE HEALTH INSURANCE | Admitting: *Deleted

## 2011-09-13 VITALS — BP 130/89 | HR 97 | Temp 98.1°F | Ht 68.0 in | Wt 237.0 lb

## 2011-09-13 DIAGNOSIS — Z48812 Encounter for surgical aftercare following surgery on the circulatory system: Secondary | ICD-10-CM

## 2011-09-13 DIAGNOSIS — I70219 Atherosclerosis of native arteries of extremities with intermittent claudication, unspecified extremity: Secondary | ICD-10-CM

## 2011-09-13 DIAGNOSIS — I739 Peripheral vascular disease, unspecified: Secondary | ICD-10-CM

## 2011-09-13 NOTE — Progress Notes (Signed)
Patient is a 46 year old female who previously underwent a left femoral to above-knee popliteal bypass with vein in 2010. She underwent a right femoral to above-knee popliteal bypass with vein June 24 of this year. Her postoperative course was complicated by an early graft occlusion. She returned to the operating room and had a patch angioplasty of the proximal aspect of her vein graft where she had a retained valve. She returns for further followup today. She states he is still having some intermittent pain in her right fifth oh but this is improved. She has no claudication symptoms. She has returned to work.  Physical exam: Filed Vitals:   09/13/11 1127  BP: 130/89  Pulse: 97  Temp: 98.1 F (36.7 C)  TempSrc: Oral  Height: 5\' 8"  (1.727 m)  Weight: 237 lb (107.502 kg)  SpO2: 97%   Right lower extremity groin thigh incisions are all healed right foot is warm is difficult to palpate a popliteal pulse possibly secondary to the patient's limb size  Right foot fifth toe dusky at tip as well as fourth toe the fifth toe is worse than the right. There is no open wound.  Data: The patient had a graft duplex exam today due to the complications of her procedure as well as the fact that was unable to palpate a popliteal pulse. This showed a patent right femoral to above-knee popliteal bypass graft.  Assessment: Patent right femoropopliteal bypass with ischemia of the right fourth and fifth toes. These are currently viable. I believe it warrants close observation. She may require amputation of these over time for pain. The patient was given a renewed prescription of oxycodone #40 dispensed today. No further refills. If she has continued narcotic use requirement after one month we'll consider amputation of toes 4 and 5 on the right foot. Otherwise she will followup in 3 months in her graft surveillance protocol. She was also given a prescription today for diabetic protective shoes.  [name],  MD [facility] [place] Office: [phone] Pager: [phone]

## 2011-09-21 NOTE — Procedures (Unsigned)
BYPASS GRAFT EVALUATION  INDICATION:  Follow up right fem-pop graft.  HISTORY: Diabetes:  Yes Cardiac:  No Hypertension:  Yes Smoking:  Previous Previous Surgery:  Right fem-pop graft placed 08/27/2011 with subsequent thrombectomy 08/29/2011 due to retained valve at proximal anastomosis.  SINGLE LEVEL ARTERIAL EXAM                              RIGHT              LEFT Brachial: Anterior tibial: Posterior tibial: Peroneal: Ankle/brachial index:        Not performed  PREVIOUS ABI:  Date:  RIGHT:  LEFT:  LOWER EXTREMITY BYPASS GRAFT DUPLEX EXAM:  DUPLEX: 1. Patent right fem-pop graft with triphasic waveforms. 2. Distal anastomosis could not be visualized due to postsurgical     edema; however, outflow waveforms are triphasic. 3. Please see attached diagram for velocities.  IMPRESSION:  Patent right femoral-popliteal graft as described above.  ___________________________________________ Janetta Hora. Fields, MD  LT/MEDQ  D:  09/13/2011  T:  09/13/2011  Job:  [id number]

## 2011-10-02 ENCOUNTER — Ambulatory Visit: Payer: PRIVATE HEALTH INSURANCE | Admitting: Dietician

## 2011-10-08 ENCOUNTER — Encounter: Payer: Self-pay | Admitting: *Deleted

## 2011-10-15 ENCOUNTER — Telehealth: Payer: Self-pay | Admitting: *Deleted

## 2011-10-15 NOTE — Telephone Encounter (Signed)
Patient called c/o worsening pain and "peeling" of right fifth toe.  Requested pain medication as she is travelling this weekend; however, as per Dr Darrick Penna last office note, no more pain medication is to be given.  An appointment for 10/18/11 @ 2:00 was scheduled for her with Dr. Darrick Penna.

## 2011-10-17 ENCOUNTER — Encounter: Payer: Self-pay | Admitting: Vascular Surgery

## 2011-10-17 ENCOUNTER — Inpatient Hospital Stay (HOSPITAL_COMMUNITY)
Admission: EM | Admit: 2011-10-17 | Discharge: 2011-10-22 | DRG: 863 | Disposition: A | Discharge status: Home Health | Payer: PRIVATE HEALTH INSURANCE | Attending: Vascular Surgery | Admitting: Vascular Surgery

## 2011-10-17 ENCOUNTER — Encounter (HOSPITAL_COMMUNITY): Payer: Self-pay | Admitting: Emergency Medicine

## 2011-10-17 DIAGNOSIS — F411 Generalized anxiety disorder: Secondary | ICD-10-CM | POA: Diagnosis present

## 2011-10-17 DIAGNOSIS — F329 Major depressive disorder, single episode, unspecified: Secondary | ICD-10-CM | POA: Diagnosis present

## 2011-10-17 DIAGNOSIS — IMO0001 Reserved for inherently not codable concepts without codable children: Secondary | ICD-10-CM | POA: Diagnosis present

## 2011-10-17 DIAGNOSIS — Y831 Surgical operation with implant of artificial internal device as the cause of abnormal reaction of the patient, or of later complication, without mention of misadventure at the time of the procedure: Secondary | ICD-10-CM | POA: Diagnosis present

## 2011-10-17 DIAGNOSIS — F3289 Other specified depressive episodes: Secondary | ICD-10-CM | POA: Diagnosis present

## 2011-10-17 DIAGNOSIS — I10 Essential (primary) hypertension: Secondary | ICD-10-CM | POA: Diagnosis present

## 2011-10-17 DIAGNOSIS — E785 Hyperlipidemia, unspecified: Secondary | ICD-10-CM | POA: Diagnosis present

## 2011-10-17 DIAGNOSIS — D849 Immunodeficiency, unspecified: Secondary | ICD-10-CM | POA: Diagnosis present

## 2011-10-17 DIAGNOSIS — F4310 Post-traumatic stress disorder, unspecified: Secondary | ICD-10-CM | POA: Diagnosis present

## 2011-10-17 DIAGNOSIS — Z885 Allergy status to narcotic agent status: Secondary | ICD-10-CM

## 2011-10-17 DIAGNOSIS — A4902 Methicillin resistant Staphylococcus aureus infection, unspecified site: Secondary | ICD-10-CM | POA: Diagnosis present

## 2011-10-17 DIAGNOSIS — D649 Anemia, unspecified: Secondary | ICD-10-CM | POA: Diagnosis present

## 2011-10-17 DIAGNOSIS — Y92009 Unspecified place in unspecified non-institutional (private) residence as the place of occurrence of the external cause: Secondary | ICD-10-CM

## 2011-10-17 DIAGNOSIS — T8140XA Infection following a procedure, unspecified, initial encounter: Principal | ICD-10-CM | POA: Diagnosis present

## 2011-10-17 DIAGNOSIS — Z87891 Personal history of nicotine dependence: Secondary | ICD-10-CM

## 2011-10-17 DIAGNOSIS — E1159 Type 2 diabetes mellitus with other circulatory complications: Secondary | ICD-10-CM | POA: Diagnosis present

## 2011-10-17 DIAGNOSIS — I739 Peripheral vascular disease, unspecified: Secondary | ICD-10-CM | POA: Diagnosis present

## 2011-10-17 DIAGNOSIS — G4730 Sleep apnea, unspecified: Secondary | ICD-10-CM | POA: Diagnosis present

## 2011-10-17 DIAGNOSIS — S98139A Complete traumatic amputation of one unspecified lesser toe, initial encounter: Secondary | ICD-10-CM

## 2011-10-17 DIAGNOSIS — Z9089 Acquired absence of other organs: Secondary | ICD-10-CM

## 2011-10-17 DIAGNOSIS — I96 Gangrene, not elsewhere classified: Secondary | ICD-10-CM | POA: Diagnosis present

## 2011-10-17 DIAGNOSIS — Z881 Allergy status to other antibiotic agents status: Secondary | ICD-10-CM

## 2011-10-17 MED ORDER — ACETAMINOPHEN 325 MG PO TABS
650.0000 mg | ORAL_TABLET | Freq: Once | ORAL | Status: AC
  Administered 2011-10-17: 650 mg via ORAL
  Filled 2011-10-17: qty 2

## 2011-10-17 NOTE — ED Notes (Signed)
PT. REPORTS SUDDEN  BLEEDING AT INCISION SITE THIS EVENING AT RIGHT INNER THIGH ( FEMORAL BYPASS SURGERY)  WITH SWELLING / PAIN , SURGERY DONE BY DR. Leonette Most FIELDS LAST August 31, 2011.  PT. ALSO REPORTS CHILLS /FEBRILE AT TRIAGE.

## 2011-10-18 ENCOUNTER — Ambulatory Visit: Payer: PRIVATE HEALTH INSURANCE | Admitting: Vascular Surgery

## 2011-10-18 ENCOUNTER — Encounter (HOSPITAL_COMMUNITY): Payer: Self-pay

## 2011-10-18 LAB — TYPE AND SCREEN
ABO/RH(D): O POS
Antibody Screen: NEGATIVE

## 2011-10-18 LAB — CBC WITH DIFFERENTIAL/PLATELET
Basophils Absolute: 0 10*3/uL (ref 0.0–0.1)
Basophils Relative: 0 % (ref 0–1)
Eosinophils Absolute: 0.1 10*3/uL (ref 0.0–0.7)
Eosinophils Relative: 1 % (ref 0–5)
HCT: 34.7 % — ABNORMAL LOW (ref 36.0–46.0)
Hemoglobin: 11.4 g/dL — ABNORMAL LOW (ref 12.0–15.0)
Lymphocytes Relative: 18 % (ref 12–46)
Lymphs Abs: 2.3 10*3/uL (ref 0.7–4.0)
MCH: 29.4 pg (ref 26.0–34.0)
MCHC: 32.9 g/dL (ref 30.0–36.0)
MCV: 89.4 fL (ref 78.0–100.0)
Monocytes Absolute: 1 10*3/uL (ref 0.1–1.0)
Monocytes Relative: 8 % (ref 3–12)
Neutro Abs: 9.5 10*3/uL — ABNORMAL HIGH (ref 1.7–7.7)
Neutrophils Relative %: 73 % (ref 43–77)
Platelets: 397 10*3/uL (ref 150–400)
RBC: 3.88 MIL/uL (ref 3.87–5.11)
RDW: 13.1 % (ref 11.5–15.5)
WBC: 12.9 10*3/uL — ABNORMAL HIGH (ref 4.0–10.5)

## 2011-10-18 LAB — COMPREHENSIVE METABOLIC PANEL
ALT: 11 U/L (ref 0–35)
AST: 12 U/L (ref 0–37)
Albumin: 2.6 g/dL — ABNORMAL LOW (ref 3.5–5.2)
Alkaline Phosphatase: 88 U/L (ref 39–117)
BUN: 9 mg/dL (ref 6–23)
CO2: 29 mEq/L (ref 19–32)
Calcium: 8.8 mg/dL (ref 8.4–10.5)
Chloride: 100 mEq/L (ref 96–112)
Creatinine, Ser: 0.71 mg/dL (ref 0.50–1.10)
GFR calc Af Amer: >90 mL/min (ref >90)
GFR calc non Af Amer: >90 mL/min (ref >90)
Glucose, Bld: 120 mg/dL — ABNORMAL HIGH (ref 70–99)
Potassium: 3.2 mEq/L — ABNORMAL LOW (ref 3.5–5.1)
Sodium: 139 mEq/L (ref 135–145)
Total Bilirubin: 0.6 mg/dL (ref 0.3–1.2)
Total Protein: 7.5 g/dL (ref 6.0–8.3)

## 2011-10-18 LAB — CBC
HCT: 34.4 % — ABNORMAL LOW (ref 36.0–46.0)
Hemoglobin: 11.1 g/dL — ABNORMAL LOW (ref 12.0–15.0)
MCH: 29.1 pg (ref 26.0–34.0)
MCHC: 32.3 g/dL (ref 30.0–36.0)
MCV: 90.1 fL (ref 78.0–100.0)
Platelets: 384 10*3/uL (ref 150–400)
RBC: 3.82 MIL/uL — ABNORMAL LOW (ref 3.87–5.11)
RDW: 13 % (ref 11.5–15.5)
WBC: 8.8 10*3/uL (ref 4.0–10.5)

## 2011-10-18 LAB — BASIC METABOLIC PANEL
BUN: 10 mg/dL (ref 6–23)
CO2: 25 mEq/L (ref 19–32)
Calcium: 8.8 mg/dL (ref 8.4–10.5)
Chloride: 95 mEq/L — ABNORMAL LOW (ref 96–112)
Creatinine, Ser: 0.88 mg/dL (ref 0.50–1.10)
GFR calc Af Amer: 90 mL/min — ABNORMAL LOW (ref >90)
GFR calc non Af Amer: 78 mL/min — ABNORMAL LOW (ref >90)
Glucose, Bld: 232 mg/dL — ABNORMAL HIGH (ref 70–99)
Potassium: 3.6 mEq/L (ref 3.5–5.1)
Sodium: 131 mEq/L — ABNORMAL LOW (ref 135–145)

## 2011-10-18 LAB — URINALYSIS, ROUTINE W REFLEX MICROSCOPIC
Bilirubin Urine: NEGATIVE
Glucose, UA: NEGATIVE mg/dL
Ketones, ur: NEGATIVE mg/dL
Nitrite: NEGATIVE
Protein, ur: 30 mg/dL — AB
Specific Gravity, Urine: 1.023 (ref 1.005–1.030)
Urobilinogen, UA: 0.2 mg/dL (ref 0.0–1.0)
pH: 5 (ref 5.0–8.0)

## 2011-10-18 LAB — GLUCOSE, CAPILLARY
Glucose-Capillary: 70 mg/dL (ref 70–99)
Glucose-Capillary: 148 mg/dL — ABNORMAL HIGH (ref 70–99)
Glucose-Capillary: 130 mg/dL — ABNORMAL HIGH (ref 70–99)
Glucose-Capillary: 157 mg/dL — ABNORMAL HIGH (ref 70–99)

## 2011-10-18 LAB — URINE MICROSCOPIC-ADD ON

## 2011-10-18 LAB — PROTIME-INR
INR: 1.14 (ref 0.00–1.49)
INR: 1.17 (ref 0.00–1.49)
Prothrombin Time: 14.8 seconds (ref 11.6–15.2)
Prothrombin Time: 15.1 seconds (ref 11.6–15.2)

## 2011-10-18 LAB — HEMOGLOBIN A1C
Hgb A1c MFr Bld: 9.1 % — ABNORMAL HIGH (ref <5.7)
Mean Plasma Glucose: 214 mg/dL — ABNORMAL HIGH (ref <117)

## 2011-10-18 LAB — APTT: aPTT: 29 seconds (ref 24–37)

## 2011-10-18 MED ORDER — MORPHINE SULFATE 4 MG/ML IJ SOLN
INTRAMUSCULAR | Status: AC
  Filled 2011-10-18: qty 1

## 2011-10-18 MED ORDER — SODIUM CHLORIDE 0.9 % IV BOLUS (SEPSIS): 250.0000 mL | Freq: Once | INTRAVENOUS | Status: DC

## 2011-10-18 MED ORDER — POTASSIUM CHLORIDE CRYS ER 20 MEQ PO TBCR
20.0000 meq | EXTENDED_RELEASE_TABLET | Freq: Once | ORAL | Status: AC
  Administered 2011-10-18: 40 meq via ORAL
  Filled 2011-10-18: qty 2

## 2011-10-18 MED ORDER — BUPROPION HCL ER (XL) 300 MG PO TB24
300.0000 mg | ORAL_TABLET | Freq: Every day | ORAL | Status: DC
  Administered 2011-10-18 – 2011-10-22 (×5): 300 mg via ORAL
  Filled 2011-10-18 (×5): qty 1

## 2011-10-18 MED ORDER — LABETALOL HCL 5 MG/ML IV SOLN
10.0000 mg | INTRAVENOUS | Status: DC | PRN
  Filled 2011-10-18: qty 4

## 2011-10-18 MED ORDER — INSULIN GLARGINE 100 UNIT/ML ~~LOC~~ SOLN
30.0000 [IU] | Freq: Two times a day (BID) | SUBCUTANEOUS | Status: DC
  Administered 2011-10-18 – 2011-10-20 (×6): 30 [IU] via SUBCUTANEOUS

## 2011-10-18 MED ORDER — MORPHINE SULFATE 2 MG/ML IJ SOLN
2.0000 mg | INTRAMUSCULAR | Status: DC | PRN
  Administered 2011-10-18 – 2011-10-20 (×9): 4 mg via INTRAVENOUS
  Administered 2011-10-20 – 2011-10-21 (×2): 2 mg via INTRAVENOUS
  Administered 2011-10-21: 4 mg via INTRAVENOUS
  Filled 2011-10-18 (×6): qty 2
  Filled 2011-10-18: qty 1
  Filled 2011-10-18 (×3): qty 2
  Filled 2011-10-18: qty 1
  Filled 2011-10-18: qty 2

## 2011-10-18 MED ORDER — LINAGLIPTIN 5 MG PO TABS
5.0000 mg | ORAL_TABLET | Freq: Every day | ORAL | Status: DC
  Administered 2011-10-18 – 2011-10-22 (×5): 5 mg via ORAL
  Filled 2011-10-18 (×5): qty 1

## 2011-10-18 MED ORDER — ACETAMINOPHEN 325 MG PO TABS
325.0000 mg | ORAL_TABLET | ORAL | Status: DC | PRN
  Administered 2011-10-19: 650 mg via ORAL
  Filled 2011-10-18: qty 2

## 2011-10-18 MED ORDER — METOPROLOL TARTRATE 1 MG/ML IV SOLN: 2.0000 mg | INTRAVENOUS | Status: DC | PRN

## 2011-10-18 MED ORDER — INSULIN ASPART 100 UNIT/ML ~~LOC~~ SOLN
0.0000 [IU] | Freq: Three times a day (TID) | SUBCUTANEOUS | Status: DC
  Administered 2011-10-18: 2 [IU] via SUBCUTANEOUS
  Administered 2011-10-18: 3 [IU] via SUBCUTANEOUS
  Administered 2011-10-19 – 2011-10-22 (×5): 2 [IU] via SUBCUTANEOUS

## 2011-10-18 MED ORDER — ACETAMINOPHEN 650 MG RE SUPP: 325.0000 mg | RECTAL | Status: DC | PRN

## 2011-10-18 MED ORDER — BISACODYL 10 MG RE SUPP: 10.0000 mg | Freq: Every day | RECTAL | Status: DC | PRN

## 2011-10-18 MED ORDER — SITAGLIPTIN PHOS-METFORMIN HCL 50-500 MG PO TABS: 1.0000 | ORAL_TABLET | Freq: Two times a day (BID) | ORAL | Status: DC

## 2011-10-18 MED ORDER — MORPHINE SULFATE 4 MG/ML IJ SOLN
4.0000 mg | Freq: Once | INTRAMUSCULAR | Status: AC
  Administered 2011-10-18: 4 mg via INTRAVENOUS

## 2011-10-18 MED ORDER — CITALOPRAM HYDROBROMIDE 20 MG PO TABS
20.0000 mg | ORAL_TABLET | Freq: Every day | ORAL | Status: DC
  Administered 2011-10-18 – 2011-10-22 (×5): 20 mg via ORAL
  Filled 2011-10-18 (×5): qty 1

## 2011-10-18 MED ORDER — ONDANSETRON HCL 4 MG/2ML IJ SOLN
4.0000 mg | Freq: Four times a day (QID) | INTRAMUSCULAR | Status: DC | PRN
  Administered 2011-10-21: 4 mg via INTRAVENOUS
  Filled 2011-10-18 (×2): qty 2

## 2011-10-18 MED ORDER — INSULIN ASPART 100 UNIT/ML ~~LOC~~ SOLN
20.0000 [IU] | Freq: Three times a day (TID) | SUBCUTANEOUS | Status: DC
  Administered 2011-10-18 – 2011-10-20 (×4): 20 [IU] via SUBCUTANEOUS

## 2011-10-18 MED ORDER — VALSARTAN-HYDROCHLOROTHIAZIDE 160-25 MG PO TABS: 1.0000 | ORAL_TABLET | Freq: Every day | ORAL | Status: DC

## 2011-10-18 MED ORDER — IRBESARTAN 150 MG PO TABS
150.0000 mg | ORAL_TABLET | Freq: Every day | ORAL | Status: DC
  Administered 2011-10-18 – 2011-10-22 (×5): 150 mg via ORAL
  Filled 2011-10-18 (×5): qty 1

## 2011-10-18 MED ORDER — HYDROCHLOROTHIAZIDE 25 MG PO TABS
25.0000 mg | ORAL_TABLET | Freq: Every day | ORAL | Status: DC
  Administered 2011-10-18 – 2011-10-22 (×5): 25 mg via ORAL
  Filled 2011-10-18 (×5): qty 1

## 2011-10-18 MED ORDER — MORPHINE SULFATE 4 MG/ML IJ SOLN
4.0000 mg | Freq: Once | INTRAMUSCULAR | Status: AC
  Administered 2011-10-18: 4 mg via INTRAVENOUS
  Filled 2011-10-18: qty 1

## 2011-10-18 MED ORDER — SENNOSIDES-DOCUSATE SODIUM 8.6-50 MG PO TABS
1.0000 | ORAL_TABLET | Freq: Every evening | ORAL | Status: DC | PRN
  Filled 2011-10-18: qty 1

## 2011-10-18 MED ORDER — SODIUM CHLORIDE 0.9 % IV BOLUS (SEPSIS)
250.0000 mL | Freq: Once | INTRAVENOUS | Status: AC
  Administered 2011-10-18: 01:00:00 via INTRAVENOUS

## 2011-10-18 MED ORDER — SODIUM CHLORIDE 0.9 % IJ SOLN
3.0000 mL | Freq: Two times a day (BID) | INTRAMUSCULAR | Status: DC
  Administered 2011-10-18 – 2011-10-22 (×8): 3 mL via INTRAVENOUS

## 2011-10-18 MED ORDER — VANCOMYCIN HCL IN DEXTROSE 1-5 GM/200ML-% IV SOLN
1000.0000 mg | Freq: Once | INTRAVENOUS | Status: AC
  Administered 2011-10-18: 1000 mg via INTRAVENOUS
  Filled 2011-10-18: qty 200

## 2011-10-18 MED ORDER — SODIUM CHLORIDE 0.9 % IV SOLN
1750.0000 mg | Freq: Once | INTRAVENOUS | Status: AC
  Administered 2011-10-18: 1750 mg via INTRAVENOUS
  Filled 2011-10-18: qty 1750

## 2011-10-18 MED ORDER — GLIMEPIRIDE 4 MG PO TABS
4.0000 mg | ORAL_TABLET | Freq: Every day | ORAL | Status: DC
  Administered 2011-10-19 – 2011-10-22 (×4): 4 mg via ORAL
  Filled 2011-10-18 (×5): qty 1

## 2011-10-18 MED ORDER — HYDROMORPHONE HCL 2 MG PO TABS: 2.0000 mg | ORAL_TABLET | ORAL | Status: DC | PRN

## 2011-10-18 MED ORDER — HYDRALAZINE HCL 20 MG/ML IJ SOLN
10.0000 mg | INTRAMUSCULAR | Status: DC | PRN
  Filled 2011-10-18: qty 0.5

## 2011-10-18 MED ORDER — METFORMIN HCL 500 MG PO TABS
500.0000 mg | ORAL_TABLET | Freq: Two times a day (BID) | ORAL | Status: DC
  Administered 2011-10-18 – 2011-10-22 (×8): 500 mg via ORAL
  Filled 2011-10-18 (×11): qty 1

## 2011-10-18 MED ORDER — ALPRAZOLAM 0.5 MG PO TABS
1.0000 mg | ORAL_TABLET | Freq: Four times a day (QID) | ORAL | Status: DC | PRN
  Administered 2011-10-18 – 2011-10-21 (×7): 1 mg via ORAL
  Filled 2011-10-18 (×2): qty 2
  Filled 2011-10-18: qty 1
  Filled 2011-10-18 (×4): qty 2
  Filled 2011-10-18: qty 1

## 2011-10-18 MED ORDER — MORPHINE SULFATE 2 MG/ML IJ SOLN
2.0000 mg | INTRAMUSCULAR | Status: DC | PRN
  Administered 2011-10-18 (×2): 2 mg via INTRAVENOUS
  Filled 2011-10-18 (×2): qty 1

## 2011-10-18 MED ORDER — SODIUM CHLORIDE 0.9 % IV SOLN: 250.0000 mL | INTRAVENOUS | Status: DC | PRN

## 2011-10-18 MED ORDER — CIPROFLOXACIN HCL 500 MG PO TABS
500.0000 mg | ORAL_TABLET | Freq: Two times a day (BID) | ORAL | Status: DC
  Administered 2011-10-18 – 2011-10-20 (×5): 500 mg via ORAL
  Filled 2011-10-18 (×7): qty 1

## 2011-10-18 MED ORDER — PHENOL 1.4 % MT LIQD: 1.0000 | OROMUCOSAL | Status: DC | PRN

## 2011-10-18 MED ORDER — ENOXAPARIN SODIUM 40 MG/0.4ML ~~LOC~~ SOLN
40.0000 mg | SUBCUTANEOUS | Status: DC
  Administered 2011-10-18 – 2011-10-22 (×5): 40 mg via SUBCUTANEOUS
  Filled 2011-10-18 (×5): qty 0.4

## 2011-10-18 MED ORDER — DOCUSATE SODIUM 100 MG PO CAPS
100.0000 mg | ORAL_CAPSULE | Freq: Two times a day (BID) | ORAL | Status: DC
  Administered 2011-10-18 – 2011-10-22 (×9): 100 mg via ORAL
  Filled 2011-10-18 (×9): qty 1

## 2011-10-18 MED ORDER — INSULIN REGULAR HUMAN 100 UNIT/ML IJ SOLN: 20.0000 [IU] | Freq: Three times a day (TID) | INTRAMUSCULAR | Status: DC

## 2011-10-18 MED ORDER — VANCOMYCIN HCL IN DEXTROSE 1-5 GM/200ML-% IV SOLN
1000.0000 mg | Freq: Two times a day (BID) | INTRAVENOUS | Status: DC
  Administered 2011-10-18 – 2011-10-22 (×8): 1000 mg via INTRAVENOUS
  Filled 2011-10-18 (×10): qty 200

## 2011-10-18 MED ORDER — SODIUM CHLORIDE 0.9 % IJ SOLN
3.0000 mL | INTRAMUSCULAR | Status: DC | PRN
  Administered 2011-10-20: 3 mL via INTRAVENOUS

## 2011-10-18 NOTE — ED Notes (Signed)
Wound culture obtained.

## 2011-10-18 NOTE — ED Notes (Signed)
Vascular in placing packing to wound and dressing applied.  Patient to be admitted per same.

## 2011-10-18 NOTE — Progress Notes (Signed)
RN called RT for pt to have possible aspiration of food. NTS was done by RT. Not a lot of secretions or signs of aspiration. RT will continue to monitor. Pt has some unstable heart rate issues, but MD is aware. Other vitals stayed within normal limits and pt tolerated well.

## 2011-10-18 NOTE — H&P (Signed)
[facility] [place] H&P NOTE 10/18/2011 DOB: [id number] MRN : [id number]  CC: Fever drainage from right thigh wound  History of Present Illness: Kelly Barber is a 46 y.o. 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 Pt was doing well until a few days ago when she felt a tightness in the mid thigh. She states the area was hard and had become warm. She was at work last pm when she began to have drainage from the wound and developed  A high Fever to 103. She will be admitted for wound care and antibiotics. Pt denies pain in the leg otherwise and states the thigh feels better after thigh wound drained.  Past Medical History  Diagnosis Date  . Hypertension   . Immune deficiency disorder   . PVD (peripheral vascular disease)   . PTSD (post-traumatic stress disorder)   . Diabetes mellitus   . Hyperlipidemia   . Depression, major   . Anxiety disorder   . Sleep apnea     no test done. dr says needs one  . H/O hiatal hernia   . Fibromyalgia   . Anemia     Past Surgical History  Procedure Date  . Amputation     lft foot toes 1.2.3  . Femoral bypass 04/29/2008    left  . Toe amputation 07/12/2008     left foot toes 1,2,3  . Aortogram   . Cholecystectomy 1990  . Tubal ligation   . Femoral-popliteal bypass graft 08/27/2011    Procedure: BYPASS GRAFT FEMORAL-POPLITEAL ARTERY;  Surgeon: Sherren Kerns, MD;  Location: [facility] OR;  Service: Vascular;  Laterality: Right;  . Femoral-popliteal bypass graft 08/29/2011    Procedure: BYPASS GRAFT FEMORAL-POPLITEAL ARTERY;  Surgeon: Sherren Kerns, MD;  Location: [facility] OR;  Service: Vascular;  Laterality: Right;  Vein Patch Angionplasty and Thrombectomy   . Intraoperative arteriogram 08/29/2011    Procedure: INTRA OPERATIVE ARTERIOGRAM;  Surgeon: Sherren Kerns, MD;  Location: [facility] OR;  Service: Vascular;   Laterality: Right;     ROS: [x]  Positive  [ ]  Denies    General: [ ]  Weight loss, [ ]  Fever, [ ]  chills Neurologic: [ ]  Dizziness, [ ]  Blackouts, [ ]  Seizure [ ]  Stroke, [ ]  "Mini stroke", [ ]  Slurred speech, [ ]  Temporary blindness; [ ]  weakness in arms or legs, [ ]  Hoarseness Cardiac: [ ]  Chest pain/pressure, [ ]  Shortness of breath at rest [ ]  Shortness of breath with exertion, [ ]  Atrial fibrillation or irregular heartbeat Vascular: [x ] Pain in legs with walking, [ ]  Pain in legs at rest, [ ]  Pain in legs at night,  [ ]  Non-healing ulcer, [ ]  Blood clot in vein/DVT,   Pulmonary: [ ]  Home oxygen, [ ]  Productive cough, [ ]  Coughing up blood, [ ]  Asthma,  [ ]  Wheezing Musculoskeletal:  [ ]  Arthritis, [ ]  Low back pain, [ ]  Joint pain Hematologic: [ ]  Easy Bruising, [ ]  Anemia; [ ]  Hepatitis Gastrointestinal: [ ]  Blood in stool, [ ]  Gastroesophageal Reflux/heartburn, [ ]  Trouble swallowing Urinary: [ ]  chronic Kidney disease, [ ]  on HD - [ ]  MWF or [ ]  TTHS, [ ]  Burning with urination, [ ]  Difficulty urinating Skin: [ ]  Rashes, [x ] Wounds Psychological: [x ] Anxiety, [ x] Depression  Social History History  Substance  Use Topics  . Smoking status: Former Smoker -- 1.0 packs/day for 10 years    Types: Cigarettes    Quit date: 07/25/2008  . Smokeless tobacco: Never Used  . Alcohol Use: No    Family History Family History  Problem Relation Age of Onset  . Diabetes Mother   . Hyperlipidemia Mother   . Hypertension Mother   . Diabetes Father   . Heart disease Father     before age 42  . Hypertension Father   . Hyperlipidemia Father   . Hypertension Sister   . Diabetes Brother     Allergies  Allergen Reactions  . Amoxicillin-Pot Clavulanate Shortness Of Breath  . Codeine Anaphylaxis  . Darvocet (Propoxyphene-Acetaminophen) Anaphylaxis    Plain Tylenol ok  . Percocet (Oxycodone-Acetaminophen) Anaphylaxis    Plain Tylenol ok    Current Facility-Administered  Medications  Medication Dose Route Frequency Provider Last Rate Last Dose  . 0.9 %  sodium chloride infusion  250 mL Intravenous PRN Marlowe Shores, PA      . acetaminophen (TYLENOL) tablet 325-650 mg  325-650 mg Oral Q4H PRN Marlowe Shores, PA       Or  . acetaminophen (TYLENOL) suppository 325-650 mg  325-650 mg Rectal Q4H PRN Marlowe Shores, PA      . acetaminophen (TYLENOL) tablet 650 mg  650 mg Oral Once Chionesu Lytle Michaels, MD   650 mg at 10/17/11 2250  . ALPRAZolam Prudy Feeler) tablet 1 mg  1 mg Oral QID PRN Marlowe Shores, PA      . bisacodyl (DULCOLAX) suppository 10 mg  10 mg Rectal Daily PRN Marlowe Shores, PA      . buPROPion (WELLBUTRIN XL) 24 hr tablet 300 mg  300 mg Oral Daily Amelia Jo [place], PA   300 mg at 10/18/11 1221  . ciprofloxacin (CIPRO) tablet 500 mg  500 mg Oral BID Marlowe Shores, PA   500 mg at 10/18/11 1223  . citalopram (CELEXA) tablet 20 mg  20 mg Oral Daily Amelia Jo [place], [place]   20 mg at 10/18/11 1222  . docusate sodium (COLACE) capsule 100 mg  100 mg Oral BID Marlowe Shores, PA   100 mg at 10/18/11 1237  . enoxaparin (LOVENOX) injection 40 mg  40 mg Subcutaneous Q24H Amelia Jo [place], PA   40 mg at 10/18/11 1224  . glimepiride (AMARYL) tablet 4 mg  4 mg Oral Q breakfast Amelia Jo [place], [place]      . hydrALAZINE (APRESOLINE) injection 10 mg  10 mg Intravenous Q2H PRN Marlowe Shores, PA      . hydrochlorothiazide (HYDRODIURIL) tablet 25 mg  25 mg Oral Daily Amelia Jo [place], [place]   25 mg at 10/18/11 1223  . HYDROmorphone (DILAUDID) tablet 2 mg  2 mg Oral Q4H PRN Marlowe Shores, PA      . insulin aspart (novoLOG) injection 0-15 Units  0-15 Units Subcutaneous TID WC Marlowe Shores, PA   2 Units at 10/18/11 1217  . insulin aspart (novoLOG) injection 20 Units  20 Units Subcutaneous TID [facility] Amelia Jo [place], [place]   20 Units at 10/18/11 1216  . insulin glargine (LANTUS) injection 30 Units  30 Units Subcutaneous BID Amelia Jo [place], [place]   30 Units  at 10/18/11 1217  . irbesartan (AVAPRO) tablet 150 mg  150 mg Oral Daily Amelia Jo [place], [place]   150 mg at 10/18/11 1224  . labetalol (NORMODYNE,TRANDATE) injection 10 mg  10 mg Intravenous Q2H PRN Marlowe Shores, [place]      . linagliptin (TRADJENTA) tablet 5 mg  5 mg Oral Daily Amelia Jo [place], [place]   5 mg at 10/18/11 1223  . metFORMIN (GLUCOPHAGE) tablet 500 mg  500 mg Oral BID WC Marlowe Shores, PA   500 mg at 10/18/11 1222  . metoprolol (LOPRESSOR) injection 2-5 mg  2-5 mg Intravenous Q2H PRN Marlowe Shores, [place]      . morphine 2 MG/ML injection 2 mg  2 mg Intravenous Q2H PRN Marlowe Shores, PA   2 mg at 10/18/11 0801  . morphine 2 MG/ML injection 2-5 mg  2-5 mg Intravenous Q1H PRN Marlowe Shores, PA      . morphine 4 MG/ML injection 4 mg  4 mg Intravenous Once Chionesu Lytle Michaels, MD   4 mg at 10/18/11 0132  . morphine 4 MG/ML injection 4 mg  4 mg Intravenous Once Chionesu Lytle Michaels, MD   4 mg at 10/18/11 0143  . morphine 4 MG/ML injection 4 mg  4 mg Intravenous Once Chionesu Lytle Michaels, MD   4 mg at 10/18/11 0507  . morphine 4 MG/ML injection 4 mg  4 mg Intravenous Once Marlowe Shores, PA   4 mg at 10/18/11 1237  . ondansetron (ZOFRAN) injection 4 mg  4 mg Intravenous Q6H PRN Marlowe Shores, PA      . phenol (CHLORASEPTIC) mouth spray 1 spray  1 spray Mouth/Throat PRN Marlowe Shores, PA      . potassium chloride SA (K-DUR,KLOR-CON) CR tablet 20-40 mEq  20-40 mEq Oral Once Marlowe Shores, PA   40 mEq at 10/18/11 1308  . senna-docusate (Senokot-S) tablet 1 tablet  1 tablet Oral QHS PRN Amelia Jo [place], PA      . sodium chloride 0.9 % bolus 250 mL  250 mL Intravenous Once Chionesu Lytle Michaels, MD      . sodium chloride 0.9 % injection 3 mL  3 mL Intravenous Q12H Marlowe Shores, PA   3 mL at 10/18/11 1238  . sodium chloride 0.9 % injection 3 mL  3 mL Intravenous PRN Marlowe Shores, PA      . vancomycin (VANCOCIN) 1,750 mg in sodium chloride 0.9 % 500 mL IVPB  1,750 mg  Intravenous Once Sherren Kerns, MD   1,750 mg at 10/18/11 1237  . vancomycin (VANCOCIN) IVPB 1000 mg/200 mL premix  1,000 mg Intravenous Once Chionesu Lytle Michaels, MD   1,000 mg at 10/18/11 0143  . vancomycin (VANCOCIN) IVPB 1000 mg/200 mL premix  1,000 mg Intravenous Q12H Sherren Kerns, MD      . DISCONTD: insulin regular (NOVOLIN R,HUMULIN R) 100 units/mL injection 20 Units  20 Units Subcutaneous TID AC Amelia Jo [place], [place]      . DISCONTD: sitaGLIPtan-metformin (JANUMET) 50-500 MG per tablet 1 tablet  1 tablet Oral BID Marlowe Shores, [place]      . DISCONTD: sodium chloride 0.9 % bolus 250 mL  250 mL Intravenous Once Chionesu Lytle Michaels, MD      . DISCONTD: valsartan-hydrochlorothiazide (DIOVAN-HCT) 160-25 MG per tablet 1 tablet  1 tablet Oral Daily Amelia Jo [place], [place]         Imaging: No results found.  Significant Diagnostic Studies: CBC Lab Results  Component Value Date   WBC 8.8 10/18/2011   HGB 11.1* 10/18/2011   HCT 34.4* 10/18/2011   MCV 90.1 10/18/2011   PLT  384 10/18/2011    BMET    Component Value Date/Time   NA 139 10/18/2011 1038   K 3.2* 10/18/2011 1038   CL 100 10/18/2011 1038   CO2 29 10/18/2011 1038   GLUCOSE 120* 10/18/2011 1038   BUN 9 10/18/2011 1038   CREATININE 0.71 10/18/2011 1038   CALCIUM 8.8 10/18/2011 1038   GFRNONAA >90 10/18/2011 1038   GFRAA >90 10/18/2011 1038    COAG Lab Results  Component Value Date   INR 1.17 10/18/2011   INR 1.14 10/18/2011   INR 0.91 08/21/2011   No results found for this basename: PTT     Physical Examination BP Readings from Last 3 Encounters:  10/18/11 89/48  09/13/11 130/89  08/31/11 122/78   Temp Readings from Last 3 Encounters:  10/18/11 97 F (36.1 C) Oral  09/13/11 98.1 F (36.7 C) Oral  08/31/11 98.1 F (36.7 C)    SpO2 Readings from Last 3 Encounters:  10/18/11 99%  09/13/11 97%  08/31/11 98%   Pulse Readings from Last 3 Encounters:  10/18/11 81  09/13/11 97  08/31/11 93    General:  WDWN in  NAD Gait: Normal HENT: WNL Eyes: Pupils equal Pulmonary: normal non-labored breathing , without Rales, rhonchi,  wheezing Cardiac: RRR, without  Murmurs, rubs or gallops; No carotid bruits Abdomen: soft, NT, no masses Skin: no rashes, ulcers noted Biphasic doppler flow in DP/PT right foot Right mid thigh wound draining serous fluid. It was opened more at bedside, culture taken and wound packed. There is a large pocket, some of which is firm. Foot is warm with good sensation and motion Extremities without ischemic changes, dry Gangrene  Tip of 4th and 5th toes right foot Musculoskeletal: no muscle wasting or atrophy  Neurologic: A&O X 3; Appropriate Affect ;  SENSATION: normal; MOTOR FUNCTION: Pt has good and equal strength in all extremities - 5/5 Speech is fluent/normal   ASSESSMENT/PLAN: Infected vein harvest wound in this diabetic, immune deficient pt.  Will admit for dressing changes, IV antibiotics Pt may need wound opened more

## 2011-10-18 NOTE — ED Notes (Signed)
Graham cracker and sprite given to patient.  Denies further needs.

## 2011-10-18 NOTE — ED Provider Notes (Signed)
History     CSN: [id number]  Arrival date & time 10/17/11  2229   First MD Initiated Contact with Patient 10/17/11 2310      No chief complaint on file.   (Consider location/radiation/quality/duration/timing/severity/associated sxs/prior treatment) Patient is a 46 y.o. female presenting with wound check.  Wound Check  She was treated in the ED more than 14 days ago. Prior ED Treatment: Pt presents with pain, and bleeding for incision site of fe,-pop bypass.  Pt was operated on jun 24th by Dr. Darrick Penna.  Pt states that she noticed pain,  swelling and redness  at incision site,  then drainage. The fever has been present for less than 1 day. Her temperature was unmeasured prior to arrival. There has been bloody discharge from the wound. The redness has not changed. The swelling has not changed. The pain has not changed.    Past Medical History  Diagnosis Date  . Hypertension   . Immune deficiency disorder   . PVD (peripheral vascular disease)   . PTSD (post-traumatic stress disorder)   . Diabetes mellitus   . Hyperlipidemia   . Depression, major   . Anxiety disorder   . Sleep apnea     no test done. dr says needs one  . H/O hiatal hernia   . Fibromyalgia   . Anemia     Past Surgical History  Procedure Date  . Amputation     lft foot toes 1.2.3  . Femoral bypass 04/29/2008    left  . Toe amputation 07/12/2008     left foot toes 1,2,3  . Aortogram   . Cholecystectomy 1990  . Tubal ligation   . Femoral-popliteal bypass graft 08/27/2011    Procedure: BYPASS GRAFT FEMORAL-POPLITEAL ARTERY;  Surgeon: Sherren Kerns, MD;  Location: [facility] OR;  Service: Vascular;  Laterality: Right;  . Femoral-popliteal bypass graft 08/29/2011    Procedure: BYPASS GRAFT FEMORAL-POPLITEAL ARTERY;  Surgeon: Sherren Kerns, MD;  Location: [facility] OR;  Service: Vascular;  Laterality: Right;  Vein Patch Angionplasty and Thrombectomy   . Intraoperative arteriogram 08/29/2011    Procedure: INTRA OPERATIVE  ARTERIOGRAM;  Surgeon: Sherren Kerns, MD;  Location: [facility] OR;  Service: Vascular;  Laterality: Right;    Family History  Problem Relation Age of Onset  . Diabetes Mother   . Hyperlipidemia Mother   . Hypertension Mother   . Diabetes Father   . Heart disease Father     before age 35  . Hypertension Father   . Hyperlipidemia Father   . Hypertension Sister   . Diabetes Brother     History  Substance Use Topics  . Smoking status: Former Smoker -- 1.0 packs/day for 10 years    Types: Cigarettes    Quit date: 07/25/2008  . Smokeless tobacco: Never Used  . Alcohol Use: No    OB History    Grav Para Term Preterm Abortions TAB SAB Ect Mult Living                  Review of Systems  Skin: Positive for color change, pallor and wound.  All other systems reviewed and are negative.    Allergies  Amoxicillin-pot clavulanate; Codeine; Darvocet; and Percocet  Home Medications   Current Outpatient Rx  Name Route Sig Dispense Refill  . ALPRAZOLAM 1 MG PO TABS Oral Take 1 mg by mouth 4 (four) times daily as needed. anxiety    . BUPROPION HCL ER (XL) 300 MG PO TB24 Oral Take  300 mg by mouth daily.    Marland Kitchen CITALOPRAM HYDROBROMIDE 20 MG PO TABS Oral Take 20 mg by mouth daily.    Marland Kitchen GLIMEPIRIDE 4 MG PO TABS Oral Take 4 mg by mouth daily.    . INSULIN GLARGINE 100 UNIT/ML [place] SOLN Subcutaneous Inject 30 Units into the skin 2 (two) times daily.     . INSULIN REGULAR HUMAN 100 UNIT/ML IJ SOLN Subcutaneous Inject 20 Units into the skin 3 (three) times daily before meals.     Marland Kitchen JANUMET 50-500 MG PO TABS Oral Take 1 tablet by mouth 2 (two) times daily.     Marland Kitchen VALSARTAN-HYDROCHLOROTHIAZIDE 160-25 MG PO TABS Oral Take 1 tablet by mouth daily.       BP 111/80  Pulse 80  Temp 98.3 F (36.8 C) (Oral)  Resp 18  SpO2 95%  LMP 10/12/2011  Physical Exam  Constitutional: She is oriented to person, place, and time. She appears well-developed and well-nourished.  HENT:  Head: Normocephalic and  atraumatic.  Eyes: Conjunctivae and EOM are normal. Pupils are equal, round, and reactive to light.  Neck: Normal range of motion.  Cardiovascular: Normal rate, regular rhythm and normal heart sounds.   Pulmonary/Chest: Effort normal and breath sounds normal.  Abdominal: Soft. Bowel sounds are normal.  Musculoskeletal: Normal range of motion.  Neurological: She is alert and oriented to person, place, and time.  Skin: Skin is dry.       Rt 4th and 5th toe with necrosis to tips.  Left foot with well healed post surgical changes.  Rt medial thigh with 1cm area of dehiscence and serosanguinous drainage with surrounding erythema and induration  Psychiatric: She has a normal mood and affect. Her behavior is normal.    ED Course  Procedures (including critical care time)  Labs Reviewed  BASIC METABOLIC PANEL - Abnormal; Notable for the following:    Sodium 131 (*)     Chloride 95 (*)     Glucose, Bld 232 (*)     GFR calc non Af Amer 78 (*)     GFR calc Af Amer 90 (*)     All other components within normal limits  CBC WITH DIFFERENTIAL - Abnormal; Notable for the following:    WBC 12.9 (*)     Hemoglobin 11.4 (*)     HCT 34.7 (*)     Neutro Abs 9.5 (*)     All other components within normal limits  PROTIME-INR  APTT  TYPE AND SCREEN  CULTURE, BLOOD (ROUTINE X 2)  CULTURE, BLOOD (ROUTINE X 2)   No results found.   No diagnosis found.    MDM  + fever,  Leukocytosis.  Concern for wound infection.  Discussed with vascular on call for field.  Will vancomycin, and have him see in ed tomorrow as states unavailable until then        Rosanne Ashing, MD 10/18/11 [phone]

## 2011-10-18 NOTE — Progress Notes (Signed)
ANTIBIOTIC CONSULT NOTE - INITIAL  Pharmacy Consult for vancomycin Indication: wound infection  Allergies  Allergen Reactions  . Amoxicillin-Pot Clavulanate Shortness Of Breath  . Codeine Anaphylaxis  . Darvocet (Propoxyphene-Acetaminophen) Anaphylaxis    Plain Tylenol ok  . Percocet (Oxycodone-Acetaminophen) Anaphylaxis    Plain Tylenol ok    Patient Measurements: Height: 5\' 7"  (170.2 cm) Weight: 244 lb 7.8 oz (110.9 kg) IBW/kg (Calculated) : 61.6    Vital Signs: Temp: 97 F (36.1 C) (08/15 1030) Temp src: Oral (08/15 1030) BP: 89/48 mmHg (08/15 0945) Pulse Rate: 81  (08/15 1030) Intake/Output from previous day:   Intake/Output from this shift:    Labs:  Basename 10/18/11 0019  WBC 12.9*  HGB 11.4*  PLT 397  LABCREA --  CREATININE 0.88   Estimated Creatinine Clearance: 102.5 ml/min (by C-G formula based on Cr of 0.88). No results found for this basename: VANCOTROUGH:2,VANCOPEAK:2,VANCORANDOM:2,GENTTROUGH:2,GENTPEAK:2,GENTRANDOM:2,TOBRATROUGH:2,TOBRAPEAK:2,TOBRARND:2,AMIKACINPEAK:2,AMIKACINTROU:2,AMIKACIN:2, in the last 72 hours   Microbiology: No results found for this or any previous visit (from the past 720 hour(s)).  Medical History: Past Medical History  Diagnosis Date  . Hypertension   . Immune deficiency disorder   . PVD (peripheral vascular disease)   . PTSD (post-traumatic stress disorder)   . Diabetes mellitus   . Hyperlipidemia   . Depression, major   . Anxiety disorder   . Sleep apnea     no test done. dr says needs one  . H/O hiatal hernia   . Fibromyalgia   . Anemia     Medications:  Prescriptions prior to admission  Medication Sig Dispense Refill  . ALPRAZolam (XANAX) 1 MG tablet Take 1 mg by mouth 4 (four) times daily as needed. anxiety      . buPROPion (WELLBUTRIN XL) 300 MG 24 hr tablet Take 300 mg by mouth daily.      . citalopram (CELEXA) 20 MG tablet Take 20 mg by mouth daily.      Marland Kitchen glimepiride (AMARYL) 4 MG tablet Take 4 mg  by mouth daily.      . insulin glargine (LANTUS) 100 UNIT/ML injection Inject 30 Units into the skin 2 (two) times daily.       . insulin regular (NOVOLIN R,HUMULIN R) 100 units/mL injection Inject 20 Units into the skin 3 (three) times daily before meals.       Marland Kitchen JANUMET 50-500 MG per tablet Take 1 tablet by mouth 2 (two) times daily.       . valsartan-hydrochlorothiazide (DIOVAN-HCT) 160-25 MG per tablet Take 1 tablet by mouth daily.        Assessment: 46 yo lady presents to ED with pain and bleeding from incision site of fem-pop bypass.  Surgery was 08/27/11.  She has noted swelling redness and drainage from incision site with fever.  She received vancomycin 1gm in ED earlier this am.  Will give 1750 mg now as load then 1 gm IV q12 hours per obesity nomogram.    Goal of Therapy:  Vancomycin trough level 10-15 mcg/ml  Plan:  Will give 1750 mg now as load then 1 gm IV q12 hours per obesity nomogram.   F/u cultures, renal function and clinical course. Check vanc trough when appropriate.  Thanks for allowing pharmacy to be a part of this patient's care.  Talbert Cage, PharmD Clinical Pharmacist, [phone]

## 2011-10-19 LAB — GLUCOSE, CAPILLARY
Glucose-Capillary: 134 mg/dL — ABNORMAL HIGH (ref 70–99)
Glucose-Capillary: 68 mg/dL — ABNORMAL LOW (ref 70–99)
Glucose-Capillary: 103 mg/dL — ABNORMAL HIGH (ref 70–99)
Glucose-Capillary: 131 mg/dL — ABNORMAL HIGH (ref 70–99)

## 2011-10-19 NOTE — Progress Notes (Signed)
Pt sitting up eating lunch at this time; will cont. To monitor.

## 2011-10-19 NOTE — Progress Notes (Signed)
CSW recvd SW referral.  No CSW svcs needed. Vickii Penna, LCSWA [phone]  Clinical Social Work

## 2011-10-19 NOTE — Progress Notes (Signed)
CSW recvd referral for pt from [facility] re: community resources.  CSW tried to consult with pt.  Pt was sleeping.  CSW will continue to f/u with pt. Vickii Penna, LCSWA [phone]  Clinical Social Work

## 2011-10-19 NOTE — Progress Notes (Signed)
VVS  Wound Culture: abundant Staph aureus  Sensitivities pending  Cont Vanc and Cipro

## 2011-10-19 NOTE — Progress Notes (Addendum)
[facility] [place]  Subjective  Right thigh and lower extremity feels better.  She is concerned about the 4th and 5th toes.      Objective 93/58 92 98.8 F (37.1 C) (Oral) 18 94% No intake or output data in the 24 hours ending 10/19/11 0753  Right medial thigh dressing changed at bed side.  Purulent drainage, non malodorous. About 8 cm of iodoform guaze was packed in the wound.   The 4th and 5th digits appear dry gangrene without drainage or erythema. Independent bed mobility was observed. Lungs non labored breathing  Assessment/Planning:Infected vein harvest wound in this diabetic, immune deficient pt.  Will admit for dressing changes, IV antibiotics  Pt may need wound opened more  Continue BID dressing changes.     Clinton Gallant [facility] 10/19/2011 7:53 AM --  Laboratory Lab Results:  Basename 10/18/11 1038 10/18/11 0019  WBC 8.8 12.9*  HGB 11.1* 11.4*  HCT 34.4* 34.7*  PLT 384 397   BMET  Basename 10/18/11 1038 10/18/11 0019  NA 139 131*  K 3.2* 3.6  CL 100 95*  CO2 29 25  GLUCOSE 120* 232*  BUN 9 10  CREATININE 0.71 0.88  CALCIUM 8.8 8.8    COAG Lab Results  Component Value Date   INR 1.17 10/18/2011   INR 1.14 10/18/2011   INR 0.91 08/21/2011   No results found for this basename: PTT    Antibiotics Anti-infectives     Start     Dose/Rate Route Frequency Ordered Stop   10/18/11 2300   vancomycin (VANCOCIN) IVPB 1000 mg/200 mL premix        1,000 mg 200 mL/hr over 60 Minutes Intravenous Every 12 hours 10/18/11 1052     10/18/11 1145   ciprofloxacin (CIPRO) tablet 500 mg        500 mg Oral 2 times daily 10/18/11 1023     10/18/11 1100   vancomycin (VANCOCIN) 1,750 mg in sodium chloride 0.9 % 500 mL IVPB        1,750 mg 250 mL/hr over 120 Minutes Intravenous  Once 10/18/11 1051 10/18/11 1437   10/18/11 0145   vancomycin (VANCOCIN) IVPB 1000 mg/200 mL premix        1,000 mg 200 mL/hr over 60 Minutes Intravenous   Once 10/18/11 0136 10/18/11 0243             Dry gangrene toes 4/5 right foot demarcating well Right thigh incision still indurated but improved per pt  Continue local wound care and antibiotics Toes look salvageable probably will slough tips Potentially d/c Monday  [name], MD [facility] [place] Office: [phone] Pager: [phone]

## 2011-10-19 NOTE — Progress Notes (Signed)
Pt requesting PO Xanax at this time; pt given 1mg  PO Xanax; will cont. To monitor.

## 2011-10-19 NOTE — Progress Notes (Signed)
BS 68 at this time; pt sitting on side of bed talking on phone; pt given PO apple juice; will recheck BS in 15 min; will cont. To monitor.

## 2011-10-19 NOTE — Care Management Note (Signed)
    Page 1 of 2   10/22/2011     1:29:19 PM   CARE MANAGEMENT NOTE 10/22/2011  Patient:  Kelly, Barber   Account Number:  [id number]  Date Initiated:  10/18/2011  Documentation initiated by:  [facility]  Subjective/Objective Assessment:   Infection of surgery site.  independent prior to admission and working     Action/Plan:   PTA, PT LIVES WITH 47YO DAUGHTER AND 23YO AUTISTIC SON. SHE STATES SHE HAS A "HARD TIME GETTING BY", AS SHE RECEIVES NO CHILD SUPPORT.   Anticipated DC Date:  10/22/2011   Anticipated DC Plan:  HOME W HOME HEALTH SERVICES  In-house referral  Financial Counselor  Clinical Social Worker      DC Planning Services  CM consult      [facility] Choice  HOME HEALTH   Choice offered to / List presented to:  C-1 Patient        HH arranged  HH-1 RN      [facility] agency  [facility]   Status of service:  Completed, signed off Medicare Important Message given?   (If response is "NO", the following Medicare IM given date fields will be blank) Date Medicare IM given:   Date Additional Medicare IM given:    Discharge Disposition:  HOME W HOME HEALTH SERVICES  Per UR Regulation:  Reviewed for med. necessity/level of care/duration of stay  If discussed at Long Length of Stay Meetings, dates discussed:    Comments:  Contact:  Laughinghouse,Beth Sister [phone]                 Laughinghouse,Jan Sister [phone]  10/22/11 Nomar Broad,RN,BSN 1130 PT UPSET, STATES THAT HER BOSS INSISTED ON VISITING OVER THE WEEKEND, AND WHEN SHE SAW SHE WAS ON CONTACT PRECAUTIONS, QUESTIONED HER ABOUT HAVING MRSA.  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  PT NEVER ADMITTED TO ANYTHING, BUT BOSS INSISTENT THAT SHE PLANS TO TELL HER CLIENT'S PARENTS.  BOSS QUESTIONED HER NURSE FOR INFORMATION, BUT WAS GIVEN NONE.  PT GIVEN CONTACT INFORMATION FOR LEGAL AID SERVICES IN , AS THIS MAY WARRANT LEGAL  ACTION.  PT STATES SHE WILL CALL THEM FOR HELP.  REFERRAL TO [facility] FOR [facility] FOR WOUND CARE.  START OF CARE 24-48H POST DC DATE.  10/19/11 Kelly Bessey,RN,BSN [phone] PT CONCERNED ABOUT HOSP BILLS AND MAKING PAYMENTS AFTER DC. WILL CONSULT FINANCIAL COUNSELOR TO DISCUSS WITH PT.  SHE REQUESTS HELP WITH COMMUNITY RESOURCES, AS SHE STRUGGLES TO CARE FOR HER AUTISTIC SON AND YOUNG DAUGHTER ON A LIMITED INCOME.  WILL REFER TO CSW TO SEE IF ANY ASSISTANCE CAN BE OFFERED.

## 2011-10-20 LAB — WOUND CULTURE

## 2011-10-20 LAB — GLUCOSE, CAPILLARY
Glucose-Capillary: 137 mg/dL — ABNORMAL HIGH (ref 70–99)
Glucose-Capillary: 150 mg/dL — ABNORMAL HIGH (ref 70–99)
Glucose-Capillary: 54 mg/dL — ABNORMAL LOW (ref 70–99)
Glucose-Capillary: 59 mg/dL — ABNORMAL LOW (ref 70–99)
Glucose-Capillary: 71 mg/dL (ref 70–99)
Glucose-Capillary: 96 mg/dL (ref 70–99)
Glucose-Capillary: 135 mg/dL — ABNORMAL HIGH (ref 70–99)

## 2011-10-20 MED ORDER — LIDOCAINE HCL 1 % IJ SOLN
20.0000 mL | Freq: Once | INTRAMUSCULAR | Status: DC
  Filled 2011-10-20: qty 20

## 2011-10-20 MED ORDER — GLUCOSE 40 % PO GEL
ORAL | Status: AC
  Administered 2011-10-20: 17:00:00
  Filled 2011-10-20: qty 1

## 2011-10-20 MED ORDER — GLUCOSE 40 % PO GEL: 1.0000 | ORAL | Status: DC | PRN

## 2011-10-20 NOTE — Progress Notes (Signed)
Hypoglycemic Event  CBG: 54  Treatment:  orange juice 4 oz  Symptoms:  sweaty , dizzy  Follow-up CBG: Time:1645 CBG Result:59  Possible Reasons for Event:  lantus 30 mg , sliding scale coverage with additional 20 units of meal coverage  Comments/MD notified not at this time, will give dextrose gel and recheck cbg in 15 minutes    Deagen Krass Diane  Remember to initiate Hypoglycemia Order Set & complete

## 2011-10-20 NOTE — Progress Notes (Signed)
Procedure 1% lidocaine right medial thigh Incised open area to break down loculations and provide better drainage Continue wet to dry dressing with next change  [name], MD [facility] [place] Office: [phone] Pager: [phone]

## 2011-10-20 NOTE — Progress Notes (Addendum)
[facility] [place]  Progress Note Bypass Surgery        10/18/2011  DOB: [id number]  MRN : [id number]  CC: Fever drainage from right thigh wound  History of Present Illness History of Present Illness: Kelly Barber is a 46 y.o. 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 Pt was doing well until a few days ago when she felt a tightness in the mid thigh. She states the area was hard and had become warm. She was at work last pm when she began to have drainage from the wound and developed A high Fever to 103. She will be admitted for wound care and antibiotics. Pt denies pain in the leg otherwise and states the thigh feels better after thigh wound drained.        Imaging: No results found.  Significant Diagnostic Studies: CBC Lab Results  Component Value Date   WBC 8.8 10/18/2011   HGB 11.1* 10/18/2011   HCT 34.4* 10/18/2011   MCV 90.1 10/18/2011   PLT 384 10/18/2011    BMET     Component Value Date/Time   NA 139 10/18/2011 1038   K 3.2* 10/18/2011 1038   CL 100 10/18/2011 1038   CO2 29 10/18/2011 1038   GLUCOSE 120* 10/18/2011 1038   BUN 9 10/18/2011 1038   CREATININE 0.71 10/18/2011 1038   CALCIUM 8.8 10/18/2011 1038   GFRNONAA >90 10/18/2011 1038   GFRAA >90 10/18/2011 1038    COAG Lab Results  Component Value Date   INR 1.17 10/18/2011   INR 1.14 10/18/2011   INR 0.91 08/21/2011   No results found for this basename: PTT    Physical Examination  BP Readings from Last 3 Encounters:  10/20/11 104/72  09/13/11 130/89  08/31/11 122/78   Temp Readings from Last 3 Encounters:  10/20/11 99.1 F (37.3 C) Oral  09/13/11 98.1 F (36.7 C) Oral  08/31/11 98.1 F (36.7 C)    SpO2 Readings from Last 3 Encounters:  10/20/11 94%  09/13/11 97%  08/31/11 98%   Pulse Readings from Last 3 Encounters:  10/20/11 97  09/13/11 97    08/31/11 93   Dressing clean and dry was changed this am by nursing. Distally skin warm and dry with sensation intact. No acute distress   Assessment/Plan: Infected vein harvest wound in this diabetic, immune deficient pt.  Will admit for dressing changes, IV antibiotics  Pt may need wound opened more  Continue BID dressing changes.   Clinton Gallant [facility] [phone] 10/20/2011 8:24 AM  Culture is MRSA Palpable pedal pulse bilat. Overall leg improving still some induration Continue antibiotics and wound care Vancomycin for now, sensitive to Clinda and bactrim will send home on that at d/c Probable d/c Monday if continue to improve Ambulate  [name], MD [facility] [place] Office: [phone] Pager: [phone]

## 2011-10-20 NOTE — Progress Notes (Signed)
Hypoglycemic Event  CBG: 59  Treatment:  dextrose gel   Symptoms:  sweaty, dizzy  Follow-up CBG: Time:1705 CBG Result:71  Possible Reasons for Event:  lantus 30 units BID, with sliding scale insulin with 20 units of meal coverage  Comments/MD notified: not at this time, will cont. To monitor     Love Chowning, Chrystine Oiler  Remember to initiate Hypoglycemia Order Set & complete

## 2011-10-21 LAB — GLUCOSE, CAPILLARY
Glucose-Capillary: 128 mg/dL — ABNORMAL HIGH (ref 70–99)
Glucose-Capillary: 169 mg/dL — ABNORMAL HIGH (ref 70–99)
Glucose-Capillary: 103 mg/dL — ABNORMAL HIGH (ref 70–99)

## 2011-10-21 NOTE — Progress Notes (Signed)
ANTIBIOTIC CONSULT NOTE - FOLLOW UP  Pharmacy Consult for Vancomycin Indication: wound infection  Allergies  Allergen Reactions  . Amoxicillin-Pot Clavulanate Shortness Of Breath  . Codeine Anaphylaxis  . Darvocet (Propoxyphene-Acetaminophen) Anaphylaxis    Plain Tylenol ok  . Percocet (Oxycodone-Acetaminophen) Anaphylaxis    Plain Tylenol ok    Patient Measurements: Height: 5\' 7"  (170.2 cm) Weight: 244 lb 7.8 oz (110.9 kg) IBW/kg (Calculated) : 61.6    Vital Signs: Temp: 99.2 F (37.3 C) (08/18 0416) Temp src: Oral (08/18 0416) BP: 113/76 mmHg (08/18 0416) Pulse Rate: 97  (08/18 0416) Intake/Output from previous day: 08/17 0701 - 08/18 0700 In: 1120 [P.O.:120; IV Piggyback:1000] Out: -  Intake/Output from this shift:    Labs: No results found for this basename: WBC:3,HGB:3,PLT:3,LABCREA:3,CREATININE:3, in the last 72 hours Estimated Creatinine Clearance: 112.8 ml/min (by C-G formula based on Cr of 0.71). No results found for this basename: VANCOTROUGH:2,VANCOPEAK:2,VANCORANDOM:2,GENTTROUGH:2,GENTPEAK:2,GENTRANDOM:2,TOBRATROUGH:2,TOBRAPEAK:2,TOBRARND:2,AMIKACINPEAK:2,AMIKACINTROU:2,AMIKACIN:2, in the last 72 hours   Microbiology: Recent Results (from the past 720 hour(s))  CULTURE, BLOOD (ROUTINE X 2)     Status: Normal (Preliminary result)   Collection Time   10/18/11 12:19 AM      Component Value Range Status Comment   Specimen Description BLOOD LEFT ARM   Final    Special Requests BOTTLES DRAWN AEROBIC AND ANAEROBIC 5CC LEFT IV   Final    Culture  Setup Time 10/18/2011 04:13   Final    Culture     Final    Value:        BLOOD CULTURE RECEIVED NO GROWTH TO DATE CULTURE WILL BE HELD FOR 5 DAYS BEFORE ISSUING A FINAL NEGATIVE REPORT   Report Status PENDING   Incomplete   CULTURE, BLOOD (ROUTINE X 2)     Status: Normal (Preliminary result)   Collection Time   10/18/11 12:20 AM      Component Value Range Status Comment   Specimen Description BLOOD RIGHT ARM   Final     Special Requests BOTTLES DRAWN AEROBIC AND ANAEROBIC 10CC   Final    Culture  Setup Time 10/18/2011 04:12   Final    Culture     Final    Value:        BLOOD CULTURE RECEIVED NO GROWTH TO DATE CULTURE WILL BE HELD FOR 5 DAYS BEFORE ISSUING A FINAL NEGATIVE REPORT   Report Status PENDING   Incomplete   WOUND CULTURE     Status: Normal   Collection Time   10/18/11  7:54 AM      Component Value Range Status Comment   Specimen Description WOUND LEG RIGHT   Final    Special Requests RIGHT INNER THIGH   Final    Gram Stain     Final    Value: FEW WBC PRESENT,BOTH PMN AND MONONUCLEAR     NO SQUAMOUS EPITHELIAL CELLS SEEN     FEW GRAM POSITIVE COCCI IN PAIRS   Culture     Final    Value: ABUNDANT METHICILLIN RESISTANT STAPHYLOCOCCUS AUREUS     Note: RIFAMPIN AND GENTAMICIN SHOULD NOT BE USED AS SINGLE DRUGS FOR TREATMENT OF STAPH INFECTIONS. This organism DOES NOT demonstrate inducible Clindamycin resistance in vitro. CRITICAL RESULT CALLED TO, READ BACK BY AND VERIFIED WITH: RILEY HONEYCUTT      10/20/11 1030 BY SMITHERSJ   Report Status 10/20/2011 FINAL   Final    Organism ID, Bacteria METHICILLIN RESISTANT STAPHYLOCOCCUS AUREUS   Final     Anti-infectives  Start     Dose/Rate Route Frequency Ordered Stop   10/18/11 2300   vancomycin (VANCOCIN) IVPB 1000 mg/200 mL premix        1,000 mg 200 mL/hr over 60 Minutes Intravenous Every 12 hours 10/18/11 1052     10/18/11 1145   ciprofloxacin (CIPRO) tablet 500 mg  Status:  Discontinued        500 mg Oral 2 times daily 10/18/11 1023 10/20/11 1252   10/18/11 1100   vancomycin (VANCOCIN) 1,750 mg in sodium chloride 0.9 % 500 mL IVPB        1,750 mg 250 mL/hr over 120 Minutes Intravenous  Once 10/18/11 1051 10/18/11 1437   10/18/11 0145   vancomycin (VANCOCIN) IVPB 1000 mg/200 mL premix        1,000 mg 200 mL/hr over 60 Minutes Intravenous  Once 10/18/11 0136 10/18/11 0243          Assessment: 46 yo lady presents to ED with pain and  bleeding from incision site of fem-pop bypass with swelling and drainage. Surgery was 08/27/11.  She received vancomycin load 8/15 followed by 1 gm IV q12 hours since. Cultures came back as MRSA sensitive to Vanc, clinda, and Bactrim. Most recent note states plan to D/c Monday.   Goal of Therapy:  Vancomycin trough level 10-15 mcg/ml  Plan:  -Continue Vancomycin 1gm IV q12h -Will not draw trough at this time due to plan for pt to be discharged on PO therapy Mon.    Kelly Barber. Darin Engels.D. Clinical Pharmacist Pager [phone] Phone [phone] 10/21/2011 1:30 PM

## 2011-10-21 NOTE — Progress Notes (Signed)
Patient vomited x1, zofran given. Will cont. To monitor.

## 2011-10-21 NOTE — Progress Notes (Addendum)
[facility] [place]  Progress Note Bypass Surgery       History of Present IllnessHistory of Present Illness: Kelly Barber is a 46 y.o. 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 Pt was doing well until a few days ago when she felt a tightness in the mid thigh. She states the area was hard and had become warm. She was at work last pm when she began to have drainage from the wound and developed A high Fever to 103. She will be admitted for wound care and antibiotics. Pt denies pain in the leg otherwise and states the thigh feels better after thigh wound drained.   S/P  Procedure  1% lidocaine right medial thigh  Incised open area to break down loculations and provide better drainage  Continue wet to dry dressing with next change         Imaging: No results found.  Significant Diagnostic Studies: CBC Lab Results  Component Value Date   WBC 8.8 10/18/2011   HGB 11.1* 10/18/2011   HCT 34.4* 10/18/2011   MCV 90.1 10/18/2011   PLT 384 10/18/2011    BMET     Component Value Date/Time   NA 139 10/18/2011 1038   K 3.2* 10/18/2011 1038   CL 100 10/18/2011 1038   CO2 29 10/18/2011 1038   GLUCOSE 120* 10/18/2011 1038   BUN 9 10/18/2011 1038   CREATININE 0.71 10/18/2011 1038   CALCIUM 8.8 10/18/2011 1038   GFRNONAA >90 10/18/2011 1038   GFRAA >90 10/18/2011 1038    COAG Lab Results  Component Value Date   INR 1.17 10/18/2011   INR 1.14 10/18/2011   INR 0.91 08/21/2011   No results found for this basename: PTT    Physical Examination  BP Readings from Last 3 Encounters:  10/21/11 113/76  09/13/11 130/89  08/31/11 122/78   Temp Readings from Last 3 Encounters:  10/21/11 99.2 F (37.3 C) Oral  09/13/11 98.1 F (36.7 C) Oral  08/31/11 98.1 F (36.7 C)    SpO2 Readings from Last 3 Encounters:  10/21/11 95%  09/13/11 97%    08/31/11 98%   Pulse Readings from Last 3 Encounters:  10/21/11 97  09/13/11 97  08/31/11 93    Pt is A&O x 3 Culture is MRSA  Palpable pedal pulse bilat.  Overall leg improving still some induration    Assessment/Plan: Pt. Doing well Continue antibiotics and wound care Vancomycin for now, sensitive to Clinda and bactrim will send home on that at d/c  Probable d/c Monday if continue to improve  Ambulate Post-op pain is controlled PT/OT for ambulation Continue wound care as ordered with wet to dry 4 x 4.  Mosetta Pigeon [phone] 10/21/2011 8:33 AM  Wound clean today Continue local wound care Will need home health Plan for d/c tomorrow  [name], MD [facility] [place] Office: [phone] Pager: [phone]

## 2011-10-22 ENCOUNTER — Telehealth: Payer: Self-pay | Admitting: Vascular Surgery

## 2011-10-22 LAB — GLUCOSE, CAPILLARY: Glucose-Capillary: 82 mg/dL (ref 70–99)

## 2011-10-22 MED ORDER — SULFAMETHOXAZOLE-TRIMETHOPRIM 400-80 MG PO TABS: 1.0000 | ORAL_TABLET | Freq: Two times a day (BID) | ORAL | Status: AC

## 2011-10-22 MED ORDER — OXYCODONE HCL 5 MG PO TABS: 5.0000 mg | ORAL_TABLET | Freq: Four times a day (QID) | ORAL | Status: AC | PRN

## 2011-10-22 MED ORDER — CLINDAMYCIN HCL 300 MG PO CAPS: 300.0000 mg | ORAL_CAPSULE | Freq: Three times a day (TID) | ORAL | Status: AC

## 2011-10-22 NOTE — Telephone Encounter (Signed)
Spoke with patient and sent letter regarding follow up on 11/08/11 @ 1:00pm. dpm

## 2011-10-22 NOTE — Progress Notes (Signed)
Pt ready for discharge. Discharge instructions given to pt along with prescriptions. Pt verbalized understanding of instructions and wound care.

## 2011-10-22 NOTE — Progress Notes (Signed)
[facility] [place]  Progress Note Bypass Surgery    History of Present IllnessGloria AMISADAI Barber is a 46 y.o. 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 Pt was doing well until a few days ago when she felt a tightness in the mid thigh. She states the area was hard and had become warm. She was at work last pm when she began to have drainage from the wound and developed A high Fever to 103. She will be admitted for wound care and antibiotics. Pt denies pain in the leg otherwise and states the thigh feels better after thigh wound drained.  S/P       Imaging: No results found.  Significant Diagnostic Studies: CBC Lab Results  Component Value Date   WBC 8.8 10/18/2011   HGB 11.1* 10/18/2011   HCT 34.4* 10/18/2011   MCV 90.1 10/18/2011   PLT 384 10/18/2011    BMET     Component Value Date/Time   NA 139 10/18/2011 1038   K 3.2* 10/18/2011 1038   CL 100 10/18/2011 1038   CO2 29 10/18/2011 1038   GLUCOSE 120* 10/18/2011 1038   BUN 9 10/18/2011 1038   CREATININE 0.71 10/18/2011 1038   CALCIUM 8.8 10/18/2011 1038   GFRNONAA >90 10/18/2011 1038   GFRAA >90 10/18/2011 1038    COAG Lab Results  Component Value Date   INR 1.17 10/18/2011   INR 1.14 10/18/2011   INR 0.91 08/21/2011   No results found for this basename: PTT    Physical Examination  BP Readings from Last 3 Encounters:  10/22/11 95/64  09/13/11 130/89  08/31/11 122/78   Temp Readings from Last 3 Encounters:  10/22/11 98.5 F (36.9 C) Oral  09/13/11 98.1 F (36.7 C) Oral  08/31/11 98.1 F (36.7 C)    SpO2 Readings from Last 3 Encounters:  10/22/11 95%  09/13/11 97%  08/31/11 98%   Pulse Readings from Last 3 Encounters:  10/22/11 74  09/13/11 97  08/31/11 93    Pt is A&O x 3  Culture is MRSA  Palpable pedal pulse bilat.  Overall leg improving still some induration    Assessment/Plan:  Pt. Doing well  Continue antibiotics and wound care Vancomycin for now, sensitive to Clinda and bactrim will send home on that at d/c  Probable d/c Monday if continue to improve  Ambulate  Continue wound care as ordered with wet to dry 4 x 4.      Clinton Gallant [facility] [phone] 10/22/2011 7:18 AM

## 2011-10-22 NOTE — Telephone Encounter (Signed)
Message copied by Fredrich Birks on Mon Oct 22, 2011 10:31 AM ------      Message from: Melene Plan      Created: Mon Oct 22, 2011  9:36 AM                   ----- Message -----         From: Lars Mage, PA         Sent: 10/22/2011   7:43 AM           To: Melene Plan, RN            F/U in the office in 1-2 week with Dr. Darrick Penna for right upper thigh wound check s/p fem-by-pass.

## 2011-10-22 NOTE — Discharge Summary (Addendum)
[facility] Discharge Summary   Patient ID:  Kelly Barber MRN: [id number] DOB/AGE: 08/22/1965 46 y.o.  Admit date: 10/17/2011 Discharge date: 10/22/2011 Date of Surgery: * No surgery found * Surgeon:   Admission Diagnosis: Wound infection [id number]  Discharge Diagnoses:  Wound infection [id number]  Secondary Diagnoses: Past Medical History  Diagnosis Date  . Hypertension   . Immune deficiency disorder   . PVD (peripheral vascular disease)   . PTSD (post-traumatic stress disorder)   . Diabetes mellitus   . Hyperlipidemia   . Depression, major   . Anxiety disorder   . Sleep apnea     no test done. dr says needs one  . H/O hiatal hernia   . Fibromyalgia   . Anemia       Discharged Condition: good  HPI: IllnessGloria SUA Barber is a 46 y.o. 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 Pt was doing well until a few days ago when she felt a tightness in the mid thigh. She states the area was hard and had become warm. She was at work last pm when she began to have drainage from the wound and developed A high Fever to 103. She will be admitted for wound care and antibiotics. Pt denies pain in the leg otherwise and states the thigh feels better after thigh wound drained.        Hospital Course:  Kelly Barber is a 46 y.o. female is S/P Right  Extubated: POD # 0 Post-op wounds clean, intact, draining or healing well Pt. Ambulating, voiding and taking PO diet without difficulty. Pt pain controlled with PO pain meds. Labs as below Complications:none  Consults:     Significant Diagnostic Studies: CBC Lab Results  Component Value Date   WBC 8.8 10/18/2011   HGB 11.1* 10/18/2011   HCT 34.4* 10/18/2011   MCV 90.1 10/18/2011   PLT 384 10/18/2011    BMET    Component Value Date/Time   NA 139 10/18/2011 1038   K 3.2* 10/18/2011 1038     CL 100 10/18/2011 1038   CO2 29 10/18/2011 1038   GLUCOSE 120* 10/18/2011 1038   BUN 9 10/18/2011 1038   CREATININE 0.71 10/18/2011 1038   CALCIUM 8.8 10/18/2011 1038   GFRNONAA >90 10/18/2011 1038   GFRAA >90 10/18/2011 1038   COAG Lab Results  Component Value Date   INR 1.17 10/18/2011   INR 1.14 10/18/2011   INR 0.91 08/21/2011     Disposition:  Discharge to :Home Discharge Orders    Future Appointments: Provider: Department: Dept Phone: Center:   12/20/2011 11:30 AM [facility] [phone] [facility]   12/20/2011 12:00 PM [facility] [phone] [facility]   12/20/2011 12:45 PM Sherren Kerns, MD [facility] [phone] [facility]     Future Orders Please Complete By Expires   Resume previous diet      Call MD for:  temperature >100.5      Call MD for:  redness, tenderness, or signs of infection (pain, swelling, bleeding, redness, odor or green/yellow discharge around incision site)      Call MD for:  severe or increased pain, loss or decreased feeling  in affected limb(s)      Increase activity slowly      Comments:   Walk with assistance use walker or cane as needed  May shower          Flossie, Wex[place]  Home Medication Instructions [id number]   Printed on:10/22/11 0740  Medication Information                    buPROPion (WELLBUTRIN XL) 300 MG 24 hr tablet Take 300 mg by mouth daily.           insulin glargine (LANTUS) 100 UNIT/ML injection Inject 30 Units into the skin 2 (two) times daily.            insulin regular (NOVOLIN R,HUMULIN R) 100 units/mL injection Inject 20 Units into the skin 3 (three) times daily before meals.            valsartan-hydrochlorothiazide (DIOVAN-HCT) 160-25 MG per tablet Take 1 tablet by mouth daily.            ALPRAZolam (XANAX) 1 MG tablet Take 1 mg by mouth 4 (four) times daily as needed. anxiety           citalopram (CELEXA) 20 MG tablet Take 20 mg by mouth daily.           JANUMET 50-500 MG per  tablet Take 1 tablet by mouth 2 (two) times daily.            glimepiride (AMARYL) 4 MG tablet Take 4 mg by mouth daily.           clindamycin (CLEOCIN) 300 MG capsule Take 1 capsule (300 mg total) by mouth 3 (three) times daily.           sulfamethoxazole-trimethoprim (BACTRIM) 400-80 MG per tablet Take 1 tablet by mouth 2 (two) times daily.           oxyCODONE (ROXICODONE) 5 MG immediate release tablet Take 1 tablet (5 mg total) by mouth every 6 (six) hours as needed for pain.            Verbal and written Discharge instructions given to the patient. Wound care per Discharge AVS Wound culture positive for MRSA F/U in 1 week for wound check with Dr. Darrick Penna Signed: Thomasena Edis, EMMA [facility] 10/22/2011, 7:40 AM      History and exam details as above.  Pt supervisor at work apparently had some concerns about pt diagnosis of MRSA.  MRSA is a treatable bacterial infection.  5-10% of people in the community are colonized with this.  Invasive infection transmission would be extremely low with casual contact.  Good hand washing technique and avoidance of contact with open wounds should reduce this risk considerably.  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  This diagnosis should not prevent Renaldo Reel from her normal job duties.  [name], MD [facility] [place] Office: [phone] Pager: [phone]

## 2011-10-23 LAB — GLUCOSE, CAPILLARY
Glucose-Capillary: 125 mg/dL — ABNORMAL HIGH (ref 70–99)
Glucose-Capillary: 147 mg/dL — ABNORMAL HIGH (ref 70–99)

## 2011-10-24 LAB — CULTURE, BLOOD (ROUTINE X 2)
Culture: NO GROWTH
Culture: NO GROWTH

## 2011-11-07 ENCOUNTER — Encounter: Payer: Self-pay | Admitting: Vascular Surgery

## 2011-11-08 ENCOUNTER — Ambulatory Visit (INDEPENDENT_AMBULATORY_CARE_PROVIDER_SITE_OTHER): Payer: PRIVATE HEALTH INSURANCE | Admitting: Vascular Surgery

## 2011-11-08 ENCOUNTER — Ambulatory Visit: Payer: PRIVATE HEALTH INSURANCE | Admitting: Vascular Surgery

## 2011-11-08 ENCOUNTER — Encounter: Payer: Self-pay | Admitting: Vascular Surgery

## 2011-11-08 VITALS — BP 143/95 | HR 91 | Temp 98.1°F | Ht 67.0 in | Wt 240.0 lb

## 2011-11-08 DIAGNOSIS — I70219 Atherosclerosis of native arteries of extremities with intermittent claudication, unspecified extremity: Secondary | ICD-10-CM

## 2011-11-08 NOTE — Progress Notes (Signed)
Patient is a 46 year old female who previously underwent a left femoral to above-knee popliteal bypass with vein in 2010. She underwent a right femoral to above-knee popliteal bypass with vein June 24 of this year. Her postoperative course was complicated by an early graft occlusion. She returned to the operating room and had a patch angioplasty of the proximal aspect of her vein graft where she had a retained valve. Subsequently she was readmitted to the hospital with a wound infection at the right mid thigh. She returns for further followup today. She states he is still having some intermittent pain in her right fifth oh but this is improved. She has no claudication symptoms. She has returned to work.  Filed Vitals:   11/08/11 1013  BP: 143/95  Pulse: 91  Temp: 98.1 F (36.7 C)  TempSrc: Oral  Height: 5\' 7"  (1.702 m)  Weight: 240 lb (108.863 kg)  SpO2: 100%   Extremities: Dry gangrene right fourth and fifth toe right medial thigh wound essentially healed, left foot pink and warm  Assessment: Patent bilateral femoropopliteal bypasses with dry gangrene right foot stable and healing right thigh incision  Plan: Followup graft surveillance protocol at this point. Prescription was given today for diabetic protective shoes. She currently is not experiencing any pain or signs of infection in the toes and the right foot. We will defer amputation for now.  [name], MD [facility] [place] Office: [phone] Pager: [phone]

## 2011-12-19 ENCOUNTER — Encounter: Payer: Self-pay | Admitting: Vascular Surgery

## 2011-12-20 ENCOUNTER — Other Ambulatory Visit: Payer: Self-pay | Admitting: *Deleted

## 2011-12-20 ENCOUNTER — Encounter (INDEPENDENT_AMBULATORY_CARE_PROVIDER_SITE_OTHER): Payer: PRIVATE HEALTH INSURANCE | Admitting: *Deleted

## 2011-12-20 ENCOUNTER — Ambulatory Visit (INDEPENDENT_AMBULATORY_CARE_PROVIDER_SITE_OTHER): Payer: PRIVATE HEALTH INSURANCE | Admitting: Vascular Surgery

## 2011-12-20 ENCOUNTER — Encounter: Payer: Self-pay | Admitting: *Deleted

## 2011-12-20 ENCOUNTER — Encounter: Payer: Self-pay | Admitting: Vascular Surgery

## 2011-12-20 VITALS — BP 155/95 | HR 94 | Temp 99.1°F | Resp 20 | Ht 67.0 in | Wt 240.0 lb

## 2011-12-20 DIAGNOSIS — I70219 Atherosclerosis of native arteries of extremities with intermittent claudication, unspecified extremity: Secondary | ICD-10-CM

## 2011-12-20 DIAGNOSIS — I7025 Atherosclerosis of native arteries of other extremities with ulceration: Secondary | ICD-10-CM | POA: Insufficient documentation

## 2011-12-20 DIAGNOSIS — I739 Peripheral vascular disease, unspecified: Secondary | ICD-10-CM

## 2011-12-20 DIAGNOSIS — Z48812 Encounter for surgical aftercare following surgery on the circulatory system: Secondary | ICD-10-CM

## 2011-12-20 DIAGNOSIS — L98499 Non-pressure chronic ulcer of skin of other sites with unspecified severity: Secondary | ICD-10-CM

## 2011-12-20 DIAGNOSIS — L089 Local infection of the skin and subcutaneous tissue, unspecified: Secondary | ICD-10-CM

## 2011-12-20 MED ORDER — OXYCODONE-ACETAMINOPHEN 5-325 MG PO TABS: ORAL_TABLET | ORAL | Status: DC

## 2011-12-20 MED ORDER — CLINDAMYCIN HCL 300 MG PO CAPS: 300.0000 mg | ORAL_CAPSULE | Freq: Three times a day (TID) | ORAL | Status: DC

## 2011-12-20 MED ORDER — DOXYCYCLINE HYCLATE 50 MG PO CAPS: 100.0000 mg | ORAL_CAPSULE | Freq: Two times a day (BID) | ORAL | Status: DC

## 2011-12-20 NOTE — Progress Notes (Signed)
This is a 45 her old female who previously underwent a left femoral to above-knee popliteal bypass in 2010. She subsequently underwent a right femoral to above-knee popliteal bypass with vein on June 24 of this year. Her postoperative course was complicated by early graft occlusion. She return to the operating room at patch angioplasty of proximal aspect of her vein graft where she had a retained valve. She was subsequently readmitted the hospital with a wound infection in the right mid thigh. This was MRSA. She returns today for further followup. Her wounds are now healed at this point. However, she continues to complain of pain in the right fourth and fifth digits where she has dry gangrene. She has no claudication symptoms. She also complains today of tenderness and a mass over her pubic area. Chronic medical problems remain hypertension, diabetes, hyperlipidemia. These are currently controlled.  Past Medical History  Diagnosis Date  . Hypertension   . Immune deficiency disorder   . PVD (peripheral vascular disease)   . PTSD (post-traumatic stress disorder)   . Diabetes mellitus   . Hyperlipidemia   . Depression, major   . Anxiety disorder   . Sleep apnea     no test done. dr says needs one  . H/O hiatal hernia   . Fibromyalgia   . Anemia    Review of systems: The patient that she may have had a low-grade fever of 99. She denies shortness of breath. She denies chest pain.  Physical exam: Filed Vitals:   12/20/11 1248  BP: 155/95  Pulse: 94  Temp: 99.1 F (37.3 C)  TempSrc: Oral  Resp: 20  Height: 5' 7" (1.702 m)  Weight: 240 lb (108.863 kg)  SpO2: 99%   Right lower extremity: Right foot is pink warm and well-perfused. She has dry gangrenous changes of toes 4 and 5 right foot incisions in the right leg her all completely healed  Left lower extremity: Left foot is pink warm and well-perfused.  Abdomen: She has a 3 cm nodule in the suprapubic area which is firm but not  fluctuant. There is a central 1 mm opening with yellowish purulent drainage. The area is tender to palpation. There is no surrounding erythema.  Data: The patient had an arterial duplex today of her right femoropopliteal bypass graft. She also had ABIs on the left. I reviewed and interpreted this study. The right femoropopliteal is patent with triphasic waveforms. There was slightly increased velocity at the proximal aspect of the bypass at 222 cm/s. ABIs were greater than 0.9 bilaterally.  Assessment: #1 patent bypass graft with dry gangrene toes 4 and 5. The patient still is having pain in the Street toes and has opted for amputation of these 2 toes at this point. We have scheduled this for Monday, 01/07/2012.  #2 suprapubic superficial skin abscess most likely folliculitis due to her previous history of MRSA I prescribed doxycycline 100 mg twice daily for 10 days as well as clindamycin 300 mg 3 times a day for 10 days. She will see her primary care physician if the area has not completely resolved at the end of 10 days.  This did not have an area of fluctuant enough for I&D today. She was also counseled place warm compresses on area.  Elven Laboy, MD [facility] [place] Office: [phone] Pager: [phone]  

## 2011-12-31 ENCOUNTER — Encounter (HOSPITAL_COMMUNITY)
Admission: RE | Admit: 2011-12-31 | Discharge: 2011-12-31 | Disposition: A | Discharge status: Home / Self Care | Payer: PRIVATE HEALTH INSURANCE | Source: Ambulatory Visit | Attending: Vascular Surgery | Admitting: Vascular Surgery

## 2011-12-31 ENCOUNTER — Encounter (HOSPITAL_COMMUNITY): Payer: Self-pay

## 2011-12-31 HISTORY — DX: Gastro-esophageal reflux disease without esophagitis: K21.9

## 2011-12-31 HISTORY — DX: Effusion, unspecified joint: M25.40

## 2011-12-31 HISTORY — DX: Pain in unspecified joint: M25.50

## 2011-12-31 HISTORY — DX: Thyrotoxicosis, unspecified without thyrotoxic crisis or storm: E05.90

## 2011-12-31 HISTORY — DX: Anxiety disorder, unspecified: F41.9

## 2011-12-31 HISTORY — DX: Insomnia, unspecified: G47.00

## 2011-12-31 LAB — SURGICAL PCR SCREEN
MRSA, PCR: NEGATIVE
Staphylococcus aureus: NEGATIVE

## 2011-12-31 LAB — URINALYSIS, ROUTINE W REFLEX MICROSCOPIC
Bilirubin Urine: NEGATIVE
Glucose, UA: >1000 mg/dL — AB
Ketones, ur: NEGATIVE mg/dL
Leukocytes, UA: NEGATIVE
Nitrite: NEGATIVE
Protein, ur: NEGATIVE mg/dL
Specific Gravity, Urine: 1.035 — ABNORMAL HIGH (ref 1.005–1.030)
Urobilinogen, UA: 0.2 mg/dL (ref 0.0–1.0)
pH: 5 (ref 5.0–8.0)

## 2011-12-31 LAB — CBC
HCT: 41.2 % (ref 36.0–46.0)
Hemoglobin: 13.6 g/dL (ref 12.0–15.0)
MCH: 29.5 pg (ref 26.0–34.0)
MCHC: 33 g/dL (ref 30.0–36.0)
MCV: 89.4 fL (ref 78.0–100.0)
Platelets: 323 10*3/uL (ref 150–400)
RBC: 4.61 MIL/uL (ref 3.87–5.11)
RDW: 14 % (ref 11.5–15.5)
WBC: 7.1 10*3/uL (ref 4.0–10.5)

## 2011-12-31 LAB — COMPREHENSIVE METABOLIC PANEL
ALT: 16 U/L (ref 0–35)
AST: 18 U/L (ref 0–37)
Albumin: 3.5 g/dL (ref 3.5–5.2)
Alkaline Phosphatase: 101 U/L (ref 39–117)
BUN: 18 mg/dL (ref 6–23)
CO2: 22 mEq/L (ref 19–32)
Calcium: 9.2 mg/dL (ref 8.4–10.5)
Chloride: 97 mEq/L (ref 96–112)
Creatinine, Ser: 0.76 mg/dL (ref 0.50–1.10)
GFR calc Af Amer: >90 mL/min (ref >90)
GFR calc non Af Amer: >90 mL/min (ref >90)
Glucose, Bld: 408 mg/dL — ABNORMAL HIGH (ref 70–99)
Potassium: 4.4 mEq/L (ref 3.5–5.1)
Sodium: 130 mEq/L — ABNORMAL LOW (ref 135–145)
Total Bilirubin: 0.6 mg/dL (ref 0.3–1.2)
Total Protein: 7.7 g/dL (ref 6.0–8.3)

## 2011-12-31 LAB — URINE MICROSCOPIC-ADD ON

## 2011-12-31 LAB — PROTIME-INR
INR: 0.91 (ref 0.00–1.49)
Prothrombin Time: 12.2 seconds (ref 11.6–15.2)

## 2011-12-31 LAB — HCG, SERUM, QUALITATIVE: Preg, Serum: NEGATIVE

## 2011-12-31 NOTE — Progress Notes (Addendum)
Saw Dr.Bensimone in 2007-pt states a goiter was sitting on breast bone that was causing problems  Echo and Stress test in epic from 2007  Heart cath in epic from 08/10/11  Dr.Howell with [facility] is Medical MD  EKG done 08/10/11 in epic and CXR from 08/2011

## 2011-12-31 NOTE — Pre-Procedure Instructions (Signed)
[address]  12/31/2011   Your procedure is scheduled on:  Mon, Nov 4 @ 7:30 AM  Report to [facility] at 5:30 AM.  Call this number if you have problems the morning of surgery: [phone]   Remember:   Do not eat food:After Midnight.  Take these medicines the morning of surgery with A SIP OF WATER: Alprazolam(Xanax),Bupropion(Wellbutrin),Citalopram(Celexa),Cleocin(Clindamycin),Doxycycline(Vibramycin),and Pain Pill(if needed) ,  Do not wear jewelry, make-up or nail polish.  Do not wear lotions, powders, or perfumes. You may wear deodorant.  Do not shave 48 hours prior to surgery.   Do not bring valuables to the hospital.  Contacts, dentures or bridgework may not be worn into surgery.  Leave suitcase in the car. After surgery it may be brought to your room.  For patients admitted to the hospital, checkout time is 11:00 AM the day of discharge.   Patients discharged the day of surgery will not be allowed to drive home.    Special Instructions: Shower using CHG 2 nights before surgery and the night before surgery.  If you shower the day of surgery use CHG.  Use special wash - you have one bottle of CHG for all showers.  You should use approximately 1/3 of the bottle for each shower.   Please read over the following fact sheets that you were given: Pain Booklet, Coughing and Deep Breathing, MRSA Information and Surgical Site Infection Prevention

## 2012-01-01 NOTE — Consult Note (Signed)
Anesthesia chart review: Patient is a 46 year old female scheduled for amputation right toes 4 and 5 on 01/07/12 by Dr. Darrick Penna.  History includes PAD s/p right F(above knee)PBG with thrombectomy and revision both in 08/2011, prior left F(above knee)PBG '10, former smoker, post-operative N/V, PTSD, DM2 on oral medication and insulin, anxiety, anemia, GERD, hiatal hernia, depression, HTN, fibromyalgia, obesity, obstructive sleep apnea, history of hyperthyroidism not requiring medication, unspecified and need deficiency disorder.  She has no documented history of CAD.  PCP is Dr. Providence Lanius with [facility].  She is followed at [facility].  Her last visit was for pre-operative evaluation with Dr. Eden Emms on 08/16/11 prior to her FPBG.  (His note mentions that she was previously seen by Dr. Gala Romney.)  Dr. Eden Emms did not recommend any additional cardiac testing at that time.  By notes she had an normal Adenosine Myoview in 2007 with EF "57%", but had a horrible experience with that test due to claustrophobia and "felt like she was going to die."  (See below for report.)  Echo on 07/24/05 showed: - Overall left ventricular systolic function was vigorous. There were no left ventricular regional wall motion abnormalities. - The left atrium was mildly dilated. - Right ventricular systolic function was hyperdynamic.  Nuclear stress test on 07/09/05 showed: 1. Negative for pharmacologic-stress induced ischemia.  2. Left ventricular ejection fraction 53 %.  3. Moderate anterior wall attenuation secondary to overlying soft tissues.  EKG on 08/10/11 showed NSR, possible left atrial enlargement, left axis deviation, low voltage QRS, poor R-wave progression (cannot rule out anterior infarct, age undetermined).  It was not felt significantly changed from her prior EKG on 04/29/08 (see Muse).  Chest x-ray on 08/21/2011 showed no active cardiopulmonary disease.  Labs noted.  Na 130, K 4.4.  Non-fasting glucose 408.   (A1C was 9.1 on 10/18/11).  CBC WNL.  I routed her CMET (glucose) result to Dr. Darrick Penna and Lamar Blinks, RN at [facility] earlier today.  I heard back from [place] this afternoon.  She spoke with Ms. Fenton Malling.  Patient had been out of one of her DM medications (? Amaryl) and just restarted it today.  Okey Regal is also forwarding these results to patient's PCP Angelita Ingles, MD.  Patient will get a CBG on arrival.  She had a cardiology evaluation within the last six months and no additional cardiac testing was recommended at that time.  She has since undergone two vascular procedures since.  If she has no new CV symptoms and follow-up glucose is reasonable then anticipate she can proceed as planned.  Shonna Chock, PA-C 01/01/12 1700

## 2012-01-01 NOTE — Progress Notes (Signed)
Faxed blood glucose result to pt's PCP, Dr. Devra Dopp @ [facility] @ [phone]

## 2012-01-06 MED ORDER — VANCOMYCIN HCL 1000 MG IV SOLR
1500.0000 mg | INTRAVENOUS | Status: AC
  Administered 2012-01-07: 1500 mg via INTRAVENOUS
  Filled 2012-01-06: qty 1500

## 2012-01-07 ENCOUNTER — Encounter (HOSPITAL_COMMUNITY): Payer: Self-pay | Admitting: Vascular Surgery

## 2012-01-07 ENCOUNTER — Encounter (HOSPITAL_COMMUNITY)
Admission: RE | Disposition: A | Discharge status: Home / Self Care | Payer: Self-pay | Source: Ambulatory Visit | Attending: Vascular Surgery

## 2012-01-07 ENCOUNTER — Encounter (HOSPITAL_COMMUNITY): Payer: Self-pay | Admitting: Surgery

## 2012-01-07 ENCOUNTER — Telehealth: Payer: Self-pay | Admitting: Vascular Surgery

## 2012-01-07 ENCOUNTER — Ambulatory Visit (HOSPITAL_COMMUNITY): Payer: PRIVATE HEALTH INSURANCE | Admitting: Vascular Surgery

## 2012-01-07 ENCOUNTER — Ambulatory Visit (HOSPITAL_COMMUNITY)
Admission: RE | Admit: 2012-01-07 | Discharge: 2012-01-07 | Disposition: A | Discharge status: Home / Self Care | Payer: PRIVATE HEALTH INSURANCE | Source: Ambulatory Visit | Attending: Vascular Surgery | Admitting: Vascular Surgery

## 2012-01-07 DIAGNOSIS — D649 Anemia, unspecified: Secondary | ICD-10-CM | POA: Insufficient documentation

## 2012-01-07 DIAGNOSIS — I70269 Atherosclerosis of native arteries of extremities with gangrene, unspecified extremity: Secondary | ICD-10-CM

## 2012-01-07 DIAGNOSIS — Z01812 Encounter for preprocedural laboratory examination: Secondary | ICD-10-CM | POA: Insufficient documentation

## 2012-01-07 DIAGNOSIS — I96 Gangrene, not elsewhere classified: Secondary | ICD-10-CM | POA: Insufficient documentation

## 2012-01-07 DIAGNOSIS — G4730 Sleep apnea, unspecified: Secondary | ICD-10-CM | POA: Insufficient documentation

## 2012-01-07 DIAGNOSIS — I739 Peripheral vascular disease, unspecified: Secondary | ICD-10-CM | POA: Insufficient documentation

## 2012-01-07 DIAGNOSIS — E1159 Type 2 diabetes mellitus with other circulatory complications: Secondary | ICD-10-CM | POA: Insufficient documentation

## 2012-01-07 DIAGNOSIS — E785 Hyperlipidemia, unspecified: Secondary | ICD-10-CM | POA: Insufficient documentation

## 2012-01-07 HISTORY — PX: AMPUTATION: SHX166

## 2012-01-07 LAB — GLUCOSE, CAPILLARY
Glucose-Capillary: 118 mg/dL — ABNORMAL HIGH (ref 70–99)
Glucose-Capillary: 83 mg/dL (ref 70–99)

## 2012-01-07 SURGERY — AMPUTATION, FOOT, RAY
Anesthesia: General | Site: Toe | Laterality: Right | Wound class: Contaminated

## 2012-01-07 MED ORDER — ONDANSETRON HCL 4 MG/2ML IJ SOLN: 4.0000 mg | Freq: Once | INTRAMUSCULAR | Status: DC | PRN

## 2012-01-07 MED ORDER — SODIUM CHLORIDE 0.9 % IV SOLN: INTRAVENOUS | Status: DC

## 2012-01-07 MED ORDER — MIDAZOLAM HCL 5 MG/5ML IJ SOLN
INTRAMUSCULAR | Status: DC | PRN
  Administered 2012-01-07: 2 mg via INTRAVENOUS

## 2012-01-07 MED ORDER — HYDROMORPHONE HCL PF 1 MG/ML IJ SOLN
0.2500 mg | INTRAMUSCULAR | Status: DC | PRN
  Administered 2012-01-07 (×2): 0.5 mg via INTRAVENOUS

## 2012-01-07 MED ORDER — LACTATED RINGERS IV SOLN
INTRAVENOUS | Status: DC | PRN
  Administered 2012-01-07: 07:00:00 via INTRAVENOUS

## 2012-01-07 MED ORDER — PHENYLEPHRINE HCL 10 MG/ML IJ SOLN
INTRAMUSCULAR | Status: DC | PRN
  Administered 2012-01-07: 120 ug via INTRAVENOUS
  Administered 2012-01-07: 80 ug via INTRAVENOUS
  Administered 2012-01-07: 160 ug via INTRAVENOUS
  Administered 2012-01-07: 80 ug via INTRAVENOUS
  Administered 2012-01-07: 160 ug via INTRAVENOUS
  Administered 2012-01-07: 80 ug via INTRAVENOUS
  Administered 2012-01-07: 120 ug via INTRAVENOUS
  Administered 2012-01-07: 80 ug via INTRAVENOUS
  Administered 2012-01-07: 120 ug via INTRAVENOUS
  Administered 2012-01-07: 160 ug via INTRAVENOUS
  Administered 2012-01-07: 40 ug via INTRAVENOUS

## 2012-01-07 MED ORDER — ONDANSETRON HCL 4 MG/2ML IJ SOLN
INTRAMUSCULAR | Status: DC | PRN
  Administered 2012-01-07: 4 mg via INTRAVENOUS

## 2012-01-07 MED ORDER — LIDOCAINE HCL (CARDIAC) 20 MG/ML IV SOLN
INTRAVENOUS | Status: DC | PRN
  Administered 2012-01-07: 60 mg via INTRAVENOUS

## 2012-01-07 MED ORDER — ARTIFICIAL TEARS OP OINT
TOPICAL_OINTMENT | OPHTHALMIC | Status: DC | PRN
  Administered 2012-01-07: 1 via OPHTHALMIC

## 2012-01-07 MED ORDER — FENTANYL CITRATE 0.05 MG/ML IJ SOLN
INTRAMUSCULAR | Status: DC | PRN
  Administered 2012-01-07 (×2): 50 ug via INTRAVENOUS

## 2012-01-07 MED ORDER — PROPOFOL 10 MG/ML IV BOLUS
INTRAVENOUS | Status: DC | PRN
  Administered 2012-01-07: 150 mg via INTRAVENOUS
  Administered 2012-01-07: 30 mg via INTRAVENOUS

## 2012-01-07 MED ORDER — 0.9 % SODIUM CHLORIDE (POUR BTL) OPTIME
TOPICAL | Status: DC | PRN
  Administered 2012-01-07: 1000 mL

## 2012-01-07 MED ORDER — HYDROMORPHONE HCL PF 1 MG/ML IJ SOLN
INTRAMUSCULAR | Status: AC
  Administered 2012-01-07: 0.5 mg via INTRAVENOUS
  Filled 2012-01-07: qty 1

## 2012-01-07 MED ORDER — TRAMADOL HCL 50 MG PO TABS: 50.0000 mg | ORAL_TABLET | Freq: Four times a day (QID) | ORAL | Status: DC | PRN

## 2012-01-07 SURGICAL SUPPLY — 33 items
BANDAGE ELASTIC 4 VELCRO ST LF (GAUZE/BANDAGES/DRESSINGS) ×2 IMPLANT
BANDAGE GAUZE ELAST BULKY 4 IN (GAUZE/BANDAGES/DRESSINGS) ×2 IMPLANT
CANISTER SUCTION 2500CC (MISCELLANEOUS) ×2 IMPLANT
CLOTH BEACON ORANGE TIMEOUT ST (SAFETY) ×2 IMPLANT
COVER SURGICAL LIGHT HANDLE (MISCELLANEOUS) ×2 IMPLANT
DRAPE EXTREMITY T 121X128X90 (DRAPE) ×2 IMPLANT
DRSG EMULSION OIL 3X3 NADH (GAUZE/BANDAGES/DRESSINGS) ×2 IMPLANT
ELECT REM PT RETURN 9FT ADLT (ELECTROSURGICAL) ×2
ELECTRODE REM PT RTRN 9FT ADLT (ELECTROSURGICAL) ×1 IMPLANT
GLOVE BIO SURGEON STRL SZ7.5 (GLOVE) ×2 IMPLANT
GLOVE BIOGEL PI IND STRL 6.5 (GLOVE) ×3 IMPLANT
GLOVE BIOGEL PI IND STRL 7.5 (GLOVE) ×1 IMPLANT
GLOVE BIOGEL PI INDICATOR 6.5 (GLOVE) ×3
GLOVE BIOGEL PI INDICATOR 7.5 (GLOVE) ×1
GLOVE SURG SS PI 7.5 STRL IVOR (GLOVE) ×2 IMPLANT
GOWN PREVENTION PLUS XLARGE (GOWN DISPOSABLE) ×4 IMPLANT
GOWN STRL NON-REIN LRG LVL3 (GOWN DISPOSABLE) ×4 IMPLANT
KIT BASIN OR (CUSTOM PROCEDURE TRAY) ×2 IMPLANT
KIT ROOM TURNOVER OR (KITS) ×2 IMPLANT
NS IRRIG 1000ML POUR BTL (IV SOLUTION) ×2 IMPLANT
PACK GENERAL/GYN (CUSTOM PROCEDURE TRAY) ×2 IMPLANT
PAD ARMBOARD 7.5X6 YLW CONV (MISCELLANEOUS) ×4 IMPLANT
SPECIMEN JAR SMALL (MISCELLANEOUS) ×4 IMPLANT
SPONGE GAUZE 4X4 12PLY (GAUZE/BANDAGES/DRESSINGS) ×2 IMPLANT
SUT ETHILON 3 0 PS 1 (SUTURE) ×2 IMPLANT
SUT VIC AB 3-0 SH 27 (SUTURE)
SUT VIC AB 3-0 SH 27X BRD (SUTURE) IMPLANT
SWAB COLLECTION DEVICE MRSA (MISCELLANEOUS) IMPLANT
TOWEL OR 17X24 6PK STRL BLUE (TOWEL DISPOSABLE) ×2 IMPLANT
TOWEL OR 17X26 10 PK STRL BLUE (TOWEL DISPOSABLE) ×2 IMPLANT
TUBE ANAEROBIC SPECIMEN COL (MISCELLANEOUS) IMPLANT
UNDERPAD 30X30 INCONTINENT (UNDERPADS AND DIAPERS) ×2 IMPLANT
WATER STERILE IRR 1000ML POUR (IV SOLUTION) ×2 IMPLANT

## 2012-01-07 NOTE — Anesthesia Postprocedure Evaluation (Signed)
  Anesthesia Post-op Note  Patient: Kelly Barber  Procedure(s) Performed: Procedure(s) (LRB) with comments: AMPUTATION RAY (Right) - 4TH & 5TH toes  Patient Location: PACU  Anesthesia Type:General  Level of Consciousness: awake, alert  and oriented  Airway and Oxygen Therapy: Patient Spontanous Breathing  Post-op Pain: mild  Post-op Assessment: Post-op Vital signs reviewed, Patient's Cardiovascular Status Stable, Respiratory Function Stable, Patent Airway and No signs of Nausea or vomiting  Post-op Vital Signs: Reviewed and stable  Complications: No apparent anesthesia complications

## 2012-01-07 NOTE — Interval H&P Note (Signed)
History and Physical Interval Note:  01/07/2012 7:37 AM  Kelly Barber  has presented today for surgery, with the diagnosis of PVD WITH ULCER TOES  The various methods of treatment have been discussed with the patient and family. After consideration of risks, benefits and other options for treatment, the patient has consented to  Procedure(s) (LRB) with comments: AMPUTATION RAY (Right) - 4TH & 5TH toes as a surgical intervention .  The patient's history has been reviewed, patient examined, no change in status, stable for surgery.  I have reviewed the patient's chart and labs.  Questions were answered to the patient's satisfaction.     Kelly Barber E

## 2012-01-07 NOTE — H&P (View-Only) (Signed)
This is a 56 her old female who previously underwent a left femoral to above-knee popliteal bypass in 2010. She subsequently underwent a right femoral to above-knee popliteal bypass with vein on June 24 of this year. Her postoperative course was complicated by early graft occlusion. She return to the operating room at patch angioplasty of proximal aspect of her vein graft where she had a retained valve. She was subsequently readmitted the hospital with a wound infection in the right mid thigh. This was MRSA. She returns today for further followup. Her wounds are now healed at this point. However, she continues to complain of pain in the right fourth and fifth digits where she has dry gangrene. She has no claudication symptoms. She also complains today of tenderness and a mass over her pubic area. Chronic medical problems remain hypertension, diabetes, hyperlipidemia. These are currently controlled.  Past Medical History  Diagnosis Date  . Hypertension   . Immune deficiency disorder   . PVD (peripheral vascular disease)   . PTSD (post-traumatic stress disorder)   . Diabetes mellitus   . Hyperlipidemia   . Depression, major   . Anxiety disorder   . Sleep apnea     no test done. dr says needs one  . H/O hiatal hernia   . Fibromyalgia   . Anemia    Review of systems: The patient that she may have had a low-grade fever of 99. She denies shortness of breath. She denies chest pain.  Physical exam: Filed Vitals:   12/20/11 1248  BP: 155/95  Pulse: 94  Temp: 99.1 F (37.3 C)  TempSrc: Oral  Resp: 20  Height: 5\' 7"  (1.702 m)  Weight: 240 lb (108.863 kg)  SpO2: 99%   Right lower extremity: Right foot is pink warm and well-perfused. She has dry gangrenous changes of toes 4 and 5 right foot incisions in the right leg her all completely healed  Left lower extremity: Left foot is pink warm and well-perfused.  Abdomen: She has a 3 cm nodule in the suprapubic area which is firm but not  fluctuant. There is a central 1 mm opening with yellowish purulent drainage. The area is tender to palpation. There is no surrounding erythema.  Data: The patient had an arterial duplex today of her right femoropopliteal bypass graft. She also had ABIs on the left. I reviewed and interpreted this study. The right femoropopliteal is patent with triphasic waveforms. There was slightly increased velocity at the proximal aspect of the bypass at 222 cm/s. ABIs were greater than 0.9 bilaterally.  Assessment: #1 patent bypass graft with dry gangrene toes 4 and 5. The patient still is having pain in the Street toes and has opted for amputation of these 2 toes at this point. We have scheduled this for Monday, 01/07/2012.  #2 suprapubic superficial skin abscess most likely folliculitis due to her previous history of MRSA I prescribed doxycycline 100 mg twice daily for 10 days as well as clindamycin 300 mg 3 times a day for 10 days. She will see her primary care physician if the area has not completely resolved at the end of 10 days.  This did not have an area of fluctuant enough for I&D today. She was also counseled place warm compresses on area.  [name], MD [facility] [place] Office: [phone] Pager: [phone]

## 2012-01-07 NOTE — Transfer of Care (Signed)
Immediate Anesthesia Transfer of Care Note  Patient: Kelly Barber  Procedure(s) Performed: Procedure(s) (LRB) with comments: AMPUTATION RAY (Right) - 4TH & 5TH toes  Patient Location: PACU  Anesthesia Type:General  Level of Consciousness: awake, alert  and oriented  Airway & Oxygen Therapy: Patient Spontanous Breathing and Patient connected to nasal cannula oxygen  Post-op Assessment: Report given to PACU RN  Post vital signs: Reviewed and stable  Complications: No apparent anesthesia complications

## 2012-01-07 NOTE — Telephone Encounter (Signed)
Message copied by Margaretmary Eddy on Mon Jan 07, 2012 11:25 AM ------      Message from: Melene Plan      Created: Mon Jan 07, 2012  9:27 AM                   ----- Message -----         From: Sherren Kerns, MD         Sent: 01/07/2012   8:34 AM           To: Reuel Derby, Melene Plan, RN, #            Amputation right 4th 5th toe      No assist      Needs follow up in 4-6 weeks            Leonette Most

## 2012-01-07 NOTE — Anesthesia Procedure Notes (Signed)
Procedure Name: LMA Insertion Date/Time: 01/07/2012 7:52 AM Performed by: Jefm Miles E Pre-anesthesia Checklist: Patient identified, Timeout performed, Emergency Drugs available, Suction available and Patient being monitored Patient Re-evaluated:Patient Re-evaluated prior to inductionOxygen Delivery Method: Circle system utilized Preoxygenation: Pre-oxygenation with 100% oxygen Intubation Type: IV induction Ventilation: Mask ventilation without difficulty LMA: LMA inserted LMA Size: 4.0 Number of attempts: 1 Placement Confirmation: positive ETCO2 and breath sounds checked- equal and bilateral Tube secured with: Tape Dental Injury: Teeth and Oropharynx as per pre-operative assessment

## 2012-01-07 NOTE — Anesthesia Preprocedure Evaluation (Addendum)
Anesthesia Evaluation  Patient identified by MRN, date of birth, ID band Patient awake    Reviewed: Allergy & Precautions, H&P , NPO status , Patient's Chart, lab work & pertinent test results  History of Anesthesia Complications (+) PONV  Airway Mallampati: I TM Distance: >3 FB Neck ROM: full    Dental  (+) Teeth Intact and Dental Advisory Given   Pulmonary sleep apnea ,  breath sounds clear to auscultation        Cardiovascular hypertension, + Peripheral Vascular Disease Rhythm:regular Rate:Normal     Neuro/Psych PSYCHIATRIC DISORDERS Anxiety Depression  Neuromuscular disease    GI/Hepatic hiatal hernia, GERD-  ,  Endo/Other  diabetes, Type 2, Insulin Dependent and Oral Hypoglycemic AgentsHyperthyroidism   Renal/GU      Musculoskeletal  (+) Fibromyalgia -  Abdominal   Peds  Hematology   Anesthesia Other Findings   Reproductive/Obstetrics                          Anesthesia Physical Anesthesia Plan  ASA: III  Anesthesia Plan: General   Post-op Pain Management:    Induction: Intravenous  Airway Management Planned: LMA and Oral ETT  Additional Equipment:   Intra-op Plan:   Post-operative Plan: Extubation in OR  Informed Consent: I have reviewed the patients History and Physical, chart, labs and discussed the procedure including the risks, benefits and alternatives for the proposed anesthesia with the patient or authorized representative who has indicated his/her understanding and acceptance.     Plan Discussed with: CRNA, Anesthesiologist and Surgeon  Anesthesia Plan Comments:         Anesthesia Quick Evaluation

## 2012-01-07 NOTE — Preoperative (Signed)
Beta Blockers   Reason not to administer Beta Blockers:Not Applicable 

## 2012-01-07 NOTE — Op Note (Signed)
Procedure: Amputation right fourth and fifth toe  Preoperative diagnosis: Gangrene right fourth and fifth toe  Postoperative diagnosis: Same  Anesthesia: Gen.  Assistant: Nurse  Specimens: Right fourth and fifth toe  Operative details: After obtaining informed consent, the patient was taken to the operating room. The patient was placed in supine position on the operating room table. After induction of general anesthesia the patient's entire right foot was prepped and draped in the usual sterile fashion. A circumferential incision was made around the base of the right fifth toe. Amputation was completed between the proximal and middle phalanx. The proximal phalanx was debrided back with rongeurs so that the skin closure had no tension. Attention was then turned to the right fourth toe. A similar procedure was performed. Both wounds were then thoroughly irrigated with normal saline solution. There was some digital artery pulsatile bleeding this was controlled. After hemostasis was obtained, the skin edges were approximated using 3-0 nylon simple and vertical mattress sutures. The patient tolerated the procedure well and there were no complications.  The  instrument sponge and needle count was correct at the end of the case. The patient was awakened in the operating room and taken to the recovery room in stable condition.  [name], MD [facility] Office: [phone] Pager: [phone]

## 2012-01-08 ENCOUNTER — Encounter (HOSPITAL_COMMUNITY): Payer: Self-pay | Admitting: Vascular Surgery

## 2012-02-13 ENCOUNTER — Encounter: Payer: Self-pay | Admitting: Vascular Surgery

## 2012-02-14 ENCOUNTER — Encounter: Payer: Self-pay | Admitting: Vascular Surgery

## 2012-02-14 ENCOUNTER — Ambulatory Visit (INDEPENDENT_AMBULATORY_CARE_PROVIDER_SITE_OTHER): Payer: PRIVATE HEALTH INSURANCE | Admitting: Vascular Surgery

## 2012-02-14 VITALS — BP 153/98 | HR 95 | Resp 16 | Ht 67.0 in | Wt 249.5 lb

## 2012-02-14 DIAGNOSIS — Z48812 Encounter for surgical aftercare following surgery on the circulatory system: Secondary | ICD-10-CM

## 2012-02-14 DIAGNOSIS — I70219 Atherosclerosis of native arteries of extremities with intermittent claudication, unspecified extremity: Secondary | ICD-10-CM

## 2012-02-14 NOTE — Progress Notes (Signed)
Patient is a 46 year old female who returns for followup today for amputation of toes 4 and 5 right foot. She has previous undergone a left femoropopliteal bypass in 2010 and a right femoropopliteal bypass in June of 2013. She reports no drainage from the incision. Her pain has improved significantly. She is currently struggling with some social issues with her family and was tearful today. She is currently seeing a Veterinary surgeon. She states that her diabetes still is not very well controlled but she is working on this.  Physical exam: Filed Vitals:   02/14/12 1209  BP: 153/98  Pulse: 95  Resp: 16  Height: 5\' 7"  (1.702 m)  Weight: 249 lb 8 oz (113.172 kg)  SpO2: 100%   Right foot: Well-healed fourth and fifth toe amputation sites  Assessment: Healing toe amputation right foot  Plan: The patient will return for a graft duplex of both bypasses in January 2014. She will continue in our graft surveillance protocol. She will see me again in October 2014 if there are no problems with her duplex exams.  [name], MD [facility] Office: [phone] Pager: [phone]

## 2012-02-15 NOTE — Addendum Note (Signed)
Addended by: Sharee Pimple on: 02/15/2012 08:07 AM   Modules accepted: Orders

## 2012-03-24 ENCOUNTER — Other Ambulatory Visit: Payer: Self-pay | Admitting: *Deleted

## 2012-03-24 DIAGNOSIS — Z48812 Encounter for surgical aftercare following surgery on the circulatory system: Secondary | ICD-10-CM

## 2012-03-24 DIAGNOSIS — I739 Peripheral vascular disease, unspecified: Secondary | ICD-10-CM

## 2012-05-04 DIAGNOSIS — E049 Nontoxic goiter, unspecified: Secondary | ICD-10-CM | POA: Insufficient documentation

## 2012-08-10 DIAGNOSIS — L02429 Furuncle of limb, unspecified: Secondary | ICD-10-CM | POA: Insufficient documentation

## 2012-08-14 ENCOUNTER — Ambulatory Visit: Payer: PRIVATE HEALTH INSURANCE | Admitting: Vascular Surgery

## 2012-12-05 ENCOUNTER — Other Ambulatory Visit: Payer: Self-pay | Admitting: Vascular Surgery

## 2012-12-05 DIAGNOSIS — I739 Peripheral vascular disease, unspecified: Secondary | ICD-10-CM

## 2012-12-10 ENCOUNTER — Encounter: Payer: Self-pay | Admitting: Family

## 2012-12-11 ENCOUNTER — Ambulatory Visit: Payer: PRIVATE HEALTH INSURANCE | Admitting: Family

## 2012-12-11 ENCOUNTER — Inpatient Hospital Stay (HOSPITAL_COMMUNITY): Admission: RE | Admit: 2012-12-11 | Payer: Self-pay | Source: Ambulatory Visit

## 2012-12-11 ENCOUNTER — Ambulatory Visit: Payer: PRIVATE HEALTH INSURANCE | Admitting: Vascular Surgery

## 2012-12-12 NOTE — Progress Notes (Deleted)
  Subjective:    Patient ID: Kelly Barber, female    DOB: 08-Oct-1965, 47 y.o.   MRN: [id number]  HPI    Review of Systems  Constitutional: Negative.  Negative for fever, chills, diaphoresis, activity change, appetite change and fatigue.  HENT: Positive for ear pain.        Objective:   Physical Exam        Assessment & Plan:

## 2013-02-23 NOTE — Progress Notes (Signed)
No show

## 2013-10-07 ENCOUNTER — Encounter: Payer: Self-pay | Admitting: Internal Medicine

## 2014-02-11 ENCOUNTER — Encounter (HOSPITAL_COMMUNITY): Payer: Self-pay | Admitting: Vascular Surgery

## 2014-06-12 ENCOUNTER — Encounter (HOSPITAL_COMMUNITY): Payer: Self-pay | Admitting: *Deleted

## 2014-06-12 ENCOUNTER — Emergency Department (HOSPITAL_COMMUNITY)
Admission: EM | Admit: 2014-06-12 | Discharge: 2014-06-12 | Disposition: A | Discharge status: Home / Self Care | Payer: Self-pay | Attending: Emergency Medicine | Admitting: Emergency Medicine

## 2014-06-12 DIAGNOSIS — R631 Polydipsia: Secondary | ICD-10-CM | POA: Insufficient documentation

## 2014-06-12 DIAGNOSIS — J3489 Other specified disorders of nose and nasal sinuses: Secondary | ICD-10-CM | POA: Insufficient documentation

## 2014-06-12 DIAGNOSIS — M549 Dorsalgia, unspecified: Secondary | ICD-10-CM | POA: Insufficient documentation

## 2014-06-12 DIAGNOSIS — F419 Anxiety disorder, unspecified: Secondary | ICD-10-CM | POA: Insufficient documentation

## 2014-06-12 DIAGNOSIS — N3000 Acute cystitis without hematuria: Secondary | ICD-10-CM | POA: Insufficient documentation

## 2014-06-12 DIAGNOSIS — E1165 Type 2 diabetes mellitus with hyperglycemia: Secondary | ICD-10-CM | POA: Insufficient documentation

## 2014-06-12 DIAGNOSIS — I10 Essential (primary) hypertension: Secondary | ICD-10-CM | POA: Insufficient documentation

## 2014-06-12 DIAGNOSIS — A599 Trichomoniasis, unspecified: Secondary | ICD-10-CM | POA: Insufficient documentation

## 2014-06-12 DIAGNOSIS — R509 Fever, unspecified: Secondary | ICD-10-CM | POA: Insufficient documentation

## 2014-06-12 DIAGNOSIS — Z8719 Personal history of other diseases of the digestive system: Secondary | ICD-10-CM | POA: Insufficient documentation

## 2014-06-12 DIAGNOSIS — F329 Major depressive disorder, single episode, unspecified: Secondary | ICD-10-CM | POA: Insufficient documentation

## 2014-06-12 DIAGNOSIS — Z794 Long term (current) use of insulin: Secondary | ICD-10-CM | POA: Insufficient documentation

## 2014-06-12 DIAGNOSIS — R739 Hyperglycemia, unspecified: Secondary | ICD-10-CM

## 2014-06-12 DIAGNOSIS — Z8669 Personal history of other diseases of the nervous system and sense organs: Secondary | ICD-10-CM | POA: Insufficient documentation

## 2014-06-12 DIAGNOSIS — R358 Other polyuria: Secondary | ICD-10-CM | POA: Insufficient documentation

## 2014-06-12 DIAGNOSIS — R0981 Nasal congestion: Secondary | ICD-10-CM | POA: Insufficient documentation

## 2014-06-12 DIAGNOSIS — Z792 Long term (current) use of antibiotics: Secondary | ICD-10-CM | POA: Insufficient documentation

## 2014-06-12 DIAGNOSIS — M797 Fibromyalgia: Secondary | ICD-10-CM | POA: Insufficient documentation

## 2014-06-12 DIAGNOSIS — Z862 Personal history of diseases of the blood and blood-forming organs and certain disorders involving the immune mechanism: Secondary | ICD-10-CM | POA: Insufficient documentation

## 2014-06-12 DIAGNOSIS — Z87891 Personal history of nicotine dependence: Secondary | ICD-10-CM | POA: Insufficient documentation

## 2014-06-12 DIAGNOSIS — Z880 Allergy status to penicillin: Secondary | ICD-10-CM | POA: Insufficient documentation

## 2014-06-12 DIAGNOSIS — Z79899 Other long term (current) drug therapy: Secondary | ICD-10-CM | POA: Insufficient documentation

## 2014-06-12 LAB — CBG MONITORING, ED
Glucose-Capillary: 470 mg/dL — ABNORMAL HIGH (ref 70–99)
Glucose-Capillary: 428 mg/dL — ABNORMAL HIGH (ref 70–99)
Glucose-Capillary: 419 mg/dL — ABNORMAL HIGH (ref 70–99)

## 2014-06-12 LAB — CBC WITH DIFFERENTIAL/PLATELET
Basophils Absolute: 0.1 10*3/uL (ref 0.0–0.1)
Basophils Relative: 1 % (ref 0–1)
Eosinophils Absolute: 0.1 10*3/uL (ref 0.0–0.7)
Eosinophils Relative: 1 % (ref 0–5)
HCT: 41.6 % (ref 36.0–46.0)
Hemoglobin: 13.8 g/dL (ref 12.0–15.0)
Lymphocytes Relative: 24 % (ref 12–46)
Lymphs Abs: 2.2 10*3/uL (ref 0.7–4.0)
MCH: 29.9 pg (ref 26.0–34.0)
MCHC: 33.2 g/dL (ref 30.0–36.0)
MCV: 90 fL (ref 78.0–100.0)
Monocytes Absolute: 1.3 10*3/uL — ABNORMAL HIGH (ref 0.1–1.0)
Monocytes Relative: 14 % — ABNORMAL HIGH (ref 3–12)
Neutro Abs: 5.6 10*3/uL (ref 1.7–7.7)
Neutrophils Relative %: 61 % (ref 43–77)
Platelets: 299 10*3/uL (ref 150–400)
RBC: 4.62 MIL/uL (ref 3.87–5.11)
RDW: 12.3 % (ref 11.5–15.5)
WBC: 9.3 10*3/uL (ref 4.0–10.5)

## 2014-06-12 LAB — URINE MICROSCOPIC-ADD ON

## 2014-06-12 LAB — COMPREHENSIVE METABOLIC PANEL
ALT: 16 U/L (ref 0–35)
AST: 14 U/L (ref 0–37)
Albumin: 2.8 g/dL — ABNORMAL LOW (ref 3.5–5.2)
Alkaline Phosphatase: 89 U/L (ref 39–117)
Anion gap: 13 (ref 5–15)
BUN: 22 mg/dL (ref 6–23)
CO2: 21 mmol/L (ref 19–32)
Calcium: 8.9 mg/dL (ref 8.4–10.5)
Chloride: 94 mmol/L — ABNORMAL LOW (ref 96–112)
Creatinine, Ser: 1.29 mg/dL — ABNORMAL HIGH (ref 0.50–1.10)
GFR calc Af Amer: 55 mL/min — ABNORMAL LOW (ref >90)
GFR calc non Af Amer: 48 mL/min — ABNORMAL LOW (ref >90)
Glucose, Bld: 551 mg/dL (ref 70–99)
Potassium: 4.7 mmol/L (ref 3.5–5.1)
Sodium: 128 mmol/L — ABNORMAL LOW (ref 135–145)
Total Bilirubin: 1 mg/dL (ref 0.3–1.2)
Total Protein: 7.6 g/dL (ref 6.0–8.3)

## 2014-06-12 LAB — URINALYSIS, ROUTINE W REFLEX MICROSCOPIC
Bilirubin Urine: NEGATIVE
Glucose, UA: >1000 mg/dL — AB
Ketones, ur: 15 mg/dL — AB
Nitrite: POSITIVE — AB
Protein, ur: NEGATIVE mg/dL
Specific Gravity, Urine: 1.022 (ref 1.005–1.030)
Urobilinogen, UA: 0.2 mg/dL (ref 0.0–1.0)
pH: 5 (ref 5.0–8.0)

## 2014-06-12 MED ORDER — NITROFURANTOIN MONOHYD MACRO 100 MG PO CAPS: 100.0000 mg | ORAL_CAPSULE | Freq: Two times a day (BID) | ORAL | Status: DC

## 2014-06-12 MED ORDER — INSULIN ASPART 100 UNIT/ML ~~LOC~~ SOLN
10.0000 [IU] | Freq: Once | SUBCUTANEOUS | Status: DC
Start: 2014-06-12 — End: 2014-06-14

## 2014-06-12 MED ORDER — DEXTROSE 5 % IV SOLN
1.0000 g | Freq: Once | INTRAVENOUS | Status: AC
  Administered 2014-06-12: 1 g via INTRAVENOUS
  Filled 2014-06-12: qty 10

## 2014-06-12 MED ORDER — METRONIDAZOLE 500 MG PO TABS
2000.0000 mg | ORAL_TABLET | Freq: Once | ORAL | Status: AC
  Administered 2014-06-12: 2000 mg via ORAL
  Filled 2014-06-12: qty 4

## 2014-06-12 MED ORDER — SODIUM CHLORIDE 0.9 % IV BOLUS (SEPSIS)
1000.0000 mL | Freq: Once | INTRAVENOUS | Status: AC
  Administered 2014-06-12: 1000 mL via INTRAVENOUS

## 2014-06-12 MED ORDER — ONDANSETRON 4 MG PO TBDP
4.0000 mg | ORAL_TABLET | Freq: Once | ORAL | Status: AC
  Administered 2014-06-12: 4 mg via ORAL
  Filled 2014-06-12: qty 1

## 2014-06-12 MED ORDER — INSULIN ASPART 100 UNIT/ML ~~LOC~~ SOLN
10.0000 [IU] | Freq: Once | SUBCUTANEOUS | Status: AC
  Administered 2014-06-12: 10 [IU] via SUBCUTANEOUS
  Filled 2014-06-12: qty 1

## 2014-06-12 MED ORDER — INSULIN ASPART 100 UNIT/ML ~~LOC~~ SOLN
10.0000 [IU] | Freq: Once | SUBCUTANEOUS | Status: AC
  Administered 2014-06-12: 10 [IU] via INTRAVENOUS
  Filled 2014-06-12: qty 1

## 2014-06-12 MED ORDER — INSULIN ASPART 100 UNIT/ML ~~LOC~~ SOLN: 10.0000 [IU] | Freq: Once | SUBCUTANEOUS | Status: DC

## 2014-06-12 NOTE — ED Notes (Signed)
Pt ambulated to the bathroom.  

## 2014-06-12 NOTE — ED Notes (Signed)
PT monitored by pulse ox, bp cuff, and 12-lead. 

## 2014-06-12 NOTE — ED Notes (Signed)
CBG 419 

## 2014-06-12 NOTE — Discharge Instructions (Signed)
Please read and follow all provided instructions.  Your diagnoses today include:  1. Hyperglycemia without ketosis   2. Acute cystitis without hematuria   3. Trichomoniasis     Tests performed today include:  Blood counts and electrolytes - high blood sugar  Urine test - shows urinary tract infection and trichomonas  Vital signs. See below for your results today.   Medications prescribed:   Macrobid - antibiotic for urinary tract infection  You have been prescribed an antibiotic medicine: take the entire course of medicine even if you are feeling better. Stopping early can cause the antibiotic not to work.  Take any prescribed medications only as directed.  Home care instructions:  Follow any educational materials contained in this packet.  Follow-up instructions: Please [facility] Monday for further evaluation of your symptoms.   Return instructions:   Please return to the Emergency Department if you experience worsening symptoms.   Please return if you have any other emergent concerns.  Additional Information:  Your vital signs today were: BP 116/72 mmHg   Pulse 98   Temp(Src) 99 F (37.2 C) (Oral)   Resp 14   Ht 5\' 7"  (1.702 m)   Wt 250 lb (113.399 kg)   BMI 39.15 kg/m2   SpO2 97%   LMP 05/12/2014 If your blood pressure (BP) was elevated above 135/85 this visit, please have this repeated by your doctor within one month. --------------

## 2014-06-12 NOTE — ED Notes (Signed)
Pt states she has had fever and chills since Tuesday.  Pt has generalized weakness.  Denies v/d.  Pt states she feels nauseated since tues.  CBG 470.

## 2014-06-12 NOTE — ED Notes (Signed)
CBG = 428  RN Janie informed of result.

## 2014-06-12 NOTE — Progress Notes (Signed)
ED CM received consult from J. Gieple PA-C concerning establishing follow up care and medication assistance. Patient presented to [facility] ED with c/o frequent urination, fatigue, nausea, mild abdominal pain, dysuria and lower back pain, patient is a diabetic. Reviewed patient's record, no insurance listed, CBG in the 400's in the ED.  Met with patient at bedside, she reports losing her job and insurance last year and has not been able to afford health care. She states, she has not taken insulin in a while. Discussed the [facility] program and the guidelines including $3 per prescription co-pay, and one time per calendar year patient agreeable. ED CM enrolled patient, printed [place] letter and given to patient with information of the participating pharmacies. ED CM also discussed the importance and benefits of primary care, and reviewed the different types of setting to access health care, and the conditions appropriate for each setting. Patient verbalized understanding teach back done. Discussed the affordable clinics in the [facility] to access health care. Patient selected [facility]. Patient made aware that she can attend the [facility] on Monday April 11th at 9 am patient agreeable with discharge plan. Patient verbalized appreciation for the assistance. Discussed discharge plan with Claudean Kinds PA-C he is agreeable with disposition plan. No further ED CM needs identified

## 2014-06-12 NOTE — ED Notes (Signed)
Pt returned to bed. Pt monitored by pulse ox, bp cuff, and 12-lead. 

## 2014-06-12 NOTE — ED Provider Notes (Signed)
CSN: [id number]     Arrival date & time 06/12/14  4098 History   First MD Initiated Contact with Patient 06/12/14 [phone]     Chief Complaint  Patient presents with  . Hyperglycemia     (Consider location/radiation/quality/duration/timing/severity/associated sxs/prior Treatment) HPI Comments: Patient with history of insulin-dependent diabetes, peripheral arterial disease -- presents with complaint of four-day history of elevated blood sugar. She has not checked her blood sugar at home however has been having frequent urination, fatigue, nausea, mild abdominal pain, dysuria and lower back pain. Patient states that she has been out of her insulin for several months. She is currently unable to afford her medications. She states subjective fever and chills. She has had nasal congestion recently and thinks that she has a cold. No vomiting or diarrhea. No other treatments prior to arrival. The onset of this condition was acute. The course is constant. Aggravating factors: none. Alleviating factors: none.    Patient is a 49 y.o. female presenting with hyperglycemia. The history is provided by the patient.  Hyperglycemia Associated symptoms: abdominal pain (mild, generalized), dysuria, fatigue, fever (subjective), increased thirst, nausea and polyuria   Associated symptoms: no chest pain and no vomiting     Past Medical History  Diagnosis Date  . Immune deficiency disorder     pt states she has no clue what this is  . PVD (peripheral vascular disease)   . PTSD (post-traumatic stress disorder)   . Diabetes mellitus   . Hyperlipidemia   . Anxiety disorder   . Sleep apnea     no test done. dr says needs one  . H/O hiatal hernia   . Fibromyalgia   . Anemia   . PONV (postoperative nausea and vomiting)   . Hypertension     takes Lisinopril and Diovan daily  . Joint pain   . Joint swelling   . GERD (gastroesophageal reflux disease)     doesn't take any meds  . Hyperthyroidism     yrs ago but  doesn't require meds for this  . Depression, major     takes wellbutrin and celexa daily  . Anxiety     takes xanax daily  . PTSD (post-traumatic stress disorder)   . Insomnia    Past Surgical History  Procedure Laterality Date  . Amputation      lft foot toes 1.2.3  . Femoral bypass  04/29/2008    left  . Toe amputation  07/12/2008     left foot toes 1,2,3  . Aortogram    . Cholecystectomy  1990  . Tubal ligation    . Femoral-popliteal bypass graft  08/27/2011    Procedure: BYPASS GRAFT FEMORAL-POPLITEAL ARTERY;  Surgeon: Sherren Kerns, MD;  Location: [facility] OR;  Service: Vascular;  Laterality: Right;  . Femoral-popliteal bypass graft  08/29/2011    Procedure: BYPASS GRAFT FEMORAL-POPLITEAL ARTERY;  Surgeon: Sherren Kerns, MD;  Location: [facility] OR;  Service: Vascular;  Laterality: Right;  Vein Patch Angionplasty and Thrombectomy   . Intraoperative arteriogram  08/29/2011    Procedure: INTRA OPERATIVE ARTERIOGRAM;  Surgeon: Sherren Kerns, MD;  Location: [facility] OR;  Service: Vascular;  Laterality: Right;  . Vascular surgery    . Colonoscopy    . Dilation and curettage of uterus    . Amputation  01/07/2012    Procedure: AMPUTATION RAY;  Surgeon: Sherren Kerns, MD;  Location: [facility] OR;  Service: Vascular;  Laterality: Right;  4TH & 5TH toes  . Abdominal aortagram N/A  08/10/2011    Procedure: ABDOMINAL Ronny Flurry;  Surgeon: Sherren Kerns, MD;  Location: [facility] CATH LAB;  Service: Cardiovascular;  Laterality: N/A;  . Lower extremity angiogram N/A 08/10/2011    Procedure: LOWER EXTREMITY ANGIOGRAM;  Surgeon: Sherren Kerns, MD;  Location: [facility] CATH LAB;  Service: Cardiovascular;  Laterality: N/A;   Family History  Problem Relation Age of Onset  . Diabetes Mother   . Hyperlipidemia Mother   . Hypertension Mother   . Diabetes Father   . Heart disease Father     before age 50  . Hypertension Father   . Hyperlipidemia Father   . Hypertension Sister   . Diabetes Brother    History  Substance  Use Topics  . Smoking status: Former Smoker -- 1.00 packs/day for 10 years    Types: Cigarettes    Quit date: 07/25/2008  . Smokeless tobacco: Never Used     Comment: quit 37yrs ago  . Alcohol Use: No   OB History    No data available     Review of Systems  Constitutional: Positive for fever (subjective), chills and fatigue.  HENT: Positive for congestion and rhinorrhea. Negative for sore throat.   Eyes: Negative for redness.  Respiratory: Negative for cough.   Cardiovascular: Negative for chest pain.  Gastrointestinal: Positive for nausea and abdominal pain (mild, generalized). Negative for vomiting and diarrhea.  Endocrine: Positive for polydipsia and polyuria.  Genitourinary: Positive for dysuria. Negative for urgency and flank pain.  Musculoskeletal: Positive for back pain. Negative for myalgias.  Skin: Negative for rash.  Neurological: Negative for headaches.    Allergies  Amoxicillin-pot clavulanate; Codeine; Darvocet; and Percocet  Home Medications   Prior to Admission medications   Medication Sig Start Date End Date Taking? Authorizing Provider  ALPRAZolam Prudy Feeler) 1 MG tablet Take 1 mg by mouth 4 (four) times daily as needed. anxiety 06/18/11   Historical Provider, MD  buPROPion (WELLBUTRIN XL) 300 MG 24 hr tablet Take 300 mg by mouth daily.    Historical Provider, MD  citalopram (CELEXA) 20 MG tablet Take 20 mg by mouth daily.    Historical Provider, MD  clindamycin (CLEOCIN) 300 MG capsule Take 1 capsule (300 mg total) by mouth 3 (three) times daily. 12/20/11   Sherren Kerns, MD  doxycycline (VIBRAMYCIN) 50 MG capsule Take 2 capsules (100 mg total) by mouth 2 (two) times daily. 12/20/11   Sherren Kerns, MD  glimepiride (AMARYL) 4 MG tablet Take 4 mg by mouth daily. 08/23/11   Historical Provider, MD  insulin glargine (LANTUS) 100 UNIT/ML injection Inject 30 Units into the skin 2 (two) times daily.     Historical Provider, MD  insulin regular (NOVOLIN R,HUMULIN R)  100 units/mL injection Inject 20 Units into the skin 3 (three) times daily before meals.     Historical Provider, MD  JANUMET 50-500 MG per tablet Take 1 tablet by mouth 2 (two) times daily.  08/23/11   Historical Provider, MD  lisinopril-hydrochlorothiazide (PRINZIDE,ZESTORETIC) 20-25 MG per tablet Take 1 tablet by mouth daily.  11/01/11   Historical Provider, MD  oxyCODONE (OXY IR/ROXICODONE) 5 MG immediate release tablet Take 5 mg by mouth every 6 (six) hours as needed. For pain 10/22/11   Historical Provider, MD  oxyCODONE-acetaminophen (PERCOCET/ROXICET) 5-325 MG per tablet Take 1-2 tablets by mouth every 4 (four) hours as needed. For pain 12/20/11   Sherren Kerns, MD  traMADol (ULTRAM) 50 MG tablet Take 1 tablet (50 mg total) by mouth  every 6 (six) hours as needed for pain. 01/07/12   Sherren Kerns, MD  valsartan-hydrochlorothiazide (DIOVAN-HCT) 160-25 MG per tablet Take 1 tablet by mouth daily.     Historical Provider, MD   BP 119/67 mmHg  Pulse 101  Temp(Src) 99 F (37.2 C) (Oral)  Resp 18  SpO2 100%  LMP 05/12/2014   Physical Exam  Constitutional: She appears well-developed and well-nourished.  HENT:  Head: Normocephalic and atraumatic.  Right Ear: Tympanic membrane, external ear and ear canal normal.  Left Ear: Tympanic membrane, external ear and ear canal normal.  Nose: Mucosal edema present. No rhinorrhea.  Mouth/Throat: Oropharynx is clear and moist. Mucous membranes are dry.  Eyes: Conjunctivae are normal. Right eye exhibits no discharge. Left eye exhibits no discharge.  Neck: Normal range of motion. Neck supple.  Cardiovascular: Normal rate, regular rhythm and normal heart sounds.   No murmur heard. Pulmonary/Chest: Effort normal and breath sounds normal. No respiratory distress. She has no wheezes. She has no rales.  Abdominal: Soft. Bowel sounds are normal. There is tenderness (Mild, generalized). There is no rigidity, no rebound, no guarding, no CVA tenderness, no  tenderness at McBurney's point and negative Murphy's sign.  Neurological: She is alert.  Skin: Skin is warm and dry.  Psychiatric: She has a normal mood and affect.  Nursing note and vitals reviewed.   ED Course  Procedures (including critical care time) Labs Review Labs Reviewed  CBC WITH DIFFERENTIAL/PLATELET - Abnormal; Notable for the following:    Monocytes Relative 14 (*)    Monocytes Absolute 1.3 (*)    All other components within normal limits  COMPREHENSIVE METABOLIC PANEL - Abnormal; Notable for the following:    Sodium 128 (*)    Chloride 94 (*)    Glucose, Bld 551 (*)    Creatinine, Ser 1.29 (*)    Albumin 2.8 (*)    GFR calc non Af Amer 48 (*)    GFR calc Af Amer 55 (*)    All other components within normal limits  URINALYSIS, ROUTINE W REFLEX MICROSCOPIC - Abnormal; Notable for the following:    APPearance CLOUDY (*)    Glucose, UA >1000 (*)    Hgb urine dipstick MODERATE (*)    Ketones, ur 15 (*)    Nitrite POSITIVE (*)    Leukocytes, UA SMALL (*)    All other components within normal limits  URINE MICROSCOPIC-ADD ON - Abnormal; Notable for the following:    Bacteria, UA FEW (*)    All other components within normal limits  CBG MONITORING, ED - Abnormal; Notable for the following:    Glucose-Capillary 470 (*)    All other components within normal limits  CBG MONITORING, ED - Abnormal; Notable for the following:    Glucose-Capillary 428 (*)    All other components within normal limits  CBG MONITORING, ED - Abnormal; Notable for the following:    Glucose-Capillary 419 (*)    All other components within normal limits    Imaging Review No results found.   EKG Interpretation None       9:36 AM Patient seen and examined. Work-up initiated. Medications ordered.   Vital signs reviewed and are as follows: BP 119/67 mmHg  Pulse 101  Temp(Src) 99 F (37.2 C) (Oral)  Resp 18  SpO2 100%  LMP 05/12/2014  11:09 AM patient updated on results. She is  nearing completion of her second liter of fluids. Will recheck blood sugar at that time and decide  if she needs insulin. Antibiotics order for urinary tract infection. Patient notified of trichomonas.  2:28 PM Patient has received fluids, insulin, antibiotics. She has been seen by nurse care manager. She will [facility]. Patient given prescription for insulin for this weekend. Patient also prescribed Macrobid.   MDM   Final diagnoses:  Hyperglycemia without ketosis  Acute cystitis without hematuria  Trichomoniasis   Hyperglycemia: Likely due to non-compliance + UTI. No sign of DKA at this time. Normal anion gap. Patient treated as above.  Acute cystitis: Rocephin, home on macrobid (allergy to amox).   Trich: Flagyl x 1 in ED.     Renne CriglerJoshua Allecia Bells, PA-C 06/12/14 1434  Rolland PorterMark James, MD 06/13/14 [phone]

## 2014-06-13 ENCOUNTER — Telehealth: Payer: Self-pay | Admitting: Surgery

## 2014-06-13 NOTE — Telephone Encounter (Signed)
ED CM made second attempt to reach patient by phone to provide her with her scheduled appt April 11th at 11a with Dr. Venetia NightAmao, still no answer and unable to LVM. [facility].

## 2014-06-13 NOTE — Telephone Encounter (Signed)
ED CM attempted to contact patient with new scheduled appt. Unable to reach patient or LVM. Will make second attempt.

## 2014-06-14 ENCOUNTER — Ambulatory Visit: Payer: Self-pay | Attending: Family Medicine | Admitting: Family Medicine

## 2014-06-14 ENCOUNTER — Encounter: Payer: Self-pay | Admitting: Family Medicine

## 2014-06-14 VITALS — BP 122/85 | HR 84 | Temp 98.6°F | Resp 18 | Ht 67.0 in | Wt 232.0 lb

## 2014-06-14 DIAGNOSIS — E0590 Thyrotoxicosis, unspecified without thyrotoxic crisis or storm: Secondary | ICD-10-CM | POA: Insufficient documentation

## 2014-06-14 DIAGNOSIS — Z79899 Other long term (current) drug therapy: Secondary | ICD-10-CM | POA: Insufficient documentation

## 2014-06-14 DIAGNOSIS — F329 Major depressive disorder, single episode, unspecified: Secondary | ICD-10-CM | POA: Insufficient documentation

## 2014-06-14 DIAGNOSIS — E1165 Type 2 diabetes mellitus with hyperglycemia: Secondary | ICD-10-CM | POA: Insufficient documentation

## 2014-06-14 DIAGNOSIS — K219 Gastro-esophageal reflux disease without esophagitis: Secondary | ICD-10-CM | POA: Insufficient documentation

## 2014-06-14 DIAGNOSIS — H409 Unspecified glaucoma: Secondary | ICD-10-CM | POA: Insufficient documentation

## 2014-06-14 DIAGNOSIS — F4310 Post-traumatic stress disorder, unspecified: Secondary | ICD-10-CM | POA: Insufficient documentation

## 2014-06-14 DIAGNOSIS — F32A Depression, unspecified: Secondary | ICD-10-CM | POA: Insufficient documentation

## 2014-06-14 DIAGNOSIS — I739 Peripheral vascular disease, unspecified: Secondary | ICD-10-CM | POA: Insufficient documentation

## 2014-06-14 DIAGNOSIS — E1149 Type 2 diabetes mellitus with other diabetic neurological complication: Secondary | ICD-10-CM

## 2014-06-14 DIAGNOSIS — I10 Essential (primary) hypertension: Secondary | ICD-10-CM | POA: Insufficient documentation

## 2014-06-14 DIAGNOSIS — Z87891 Personal history of nicotine dependence: Secondary | ICD-10-CM | POA: Insufficient documentation

## 2014-06-14 DIAGNOSIS — M797 Fibromyalgia: Secondary | ICD-10-CM | POA: Insufficient documentation

## 2014-06-14 DIAGNOSIS — F419 Anxiety disorder, unspecified: Secondary | ICD-10-CM | POA: Insufficient documentation

## 2014-06-14 DIAGNOSIS — E118 Type 2 diabetes mellitus with unspecified complications: Secondary | ICD-10-CM

## 2014-06-14 DIAGNOSIS — Z89439 Acquired absence of unspecified foot: Secondary | ICD-10-CM | POA: Insufficient documentation

## 2014-06-14 DIAGNOSIS — E1140 Type 2 diabetes mellitus with diabetic neuropathy, unspecified: Secondary | ICD-10-CM | POA: Insufficient documentation

## 2014-06-14 DIAGNOSIS — Z794 Long term (current) use of insulin: Secondary | ICD-10-CM | POA: Insufficient documentation

## 2014-06-14 DIAGNOSIS — Z89432 Acquired absence of left foot: Secondary | ICD-10-CM

## 2014-06-14 DIAGNOSIS — E785 Hyperlipidemia, unspecified: Secondary | ICD-10-CM | POA: Insufficient documentation

## 2014-06-14 LAB — GLUCOSE, POCT (MANUAL RESULT ENTRY)
POC Glucose: 302 mg/dl — AB (ref 70–99)
POC Glucose: 299 mg/dl — AB (ref 70–99)

## 2014-06-14 LAB — POCT GLYCOSYLATED HEMOGLOBIN (HGB A1C): Hemoglobin A1C: >15

## 2014-06-14 MED ORDER — PNEUMOCOCCAL VAC POLYVALENT 25 MCG/0.5ML IJ INJ
0.5000 mL | INJECTION | Freq: Once | INTRAMUSCULAR | Status: AC
  Administered 2014-06-14: 0.5 mL via INTRAMUSCULAR

## 2014-06-14 MED ORDER — LOSARTAN POTASSIUM 50 MG PO TABS: 50.0000 mg | ORAL_TABLET | Freq: Every day | ORAL | Status: DC

## 2014-06-14 MED ORDER — INSULIN GLARGINE 100 UNIT/ML SOLOSTAR PEN: 10.0000 [IU] | PEN_INJECTOR | Freq: Every day | SUBCUTANEOUS | Status: DC

## 2014-06-14 MED ORDER — INSULIN ASPART 100 UNIT/ML ~~LOC~~ SOLN
10.0000 [IU] | Freq: Once | SUBCUTANEOUS | Status: AC
  Administered 2014-06-14: 10 [IU] via SUBCUTANEOUS

## 2014-06-14 MED ORDER — INSULIN ASPART 100 UNIT/ML ~~LOC~~ SOLN: 15.0000 [IU] | Freq: Three times a day (TID) | SUBCUTANEOUS | Status: DC

## 2014-06-14 NOTE — Progress Notes (Signed)
Patient is diabetic, [employer] insurance, has not had diabetic medications. Hospital Saturday for diabetes. Patient diagnosed with bladder infection. Patients glucose currently 302, patient reports taking 15 units of novolog this morning.

## 2014-06-14 NOTE — Patient Instructions (Signed)
Diabetes and Exercise Exercising regularly is important. It is not just about losing weight. It has many health benefits, such as:  Improving your overall fitness, flexibility, and endurance.  Increasing your bone density.  Helping with weight control.  Decreasing your body fat.  Increasing your muscle strength.  Reducing stress and tension.  Improving your overall health. People with diabetes who exercise gain additional benefits because exercise:  Reduces appetite.  Improves the body's use of blood sugar (glucose).  Helps lower or control blood glucose.  Decreases blood pressure.  Helps control blood lipids (such as cholesterol and triglycerides).  Improves the body's use of the hormone insulin by:  Increasing the body's insulin sensitivity.  Reducing the body's insulin needs.  Decreases the risk for heart disease because exercising:  Lowers cholesterol and triglycerides levels.  Increases the levels of good cholesterol (such as high-density lipoproteins [HDL]) in the body.  Lowers blood glucose levels. YOUR ACTIVITY PLAN  Choose an activity that you enjoy and set realistic goals. Your health care provider or diabetes educator can help you make an activity plan that works for you. Exercise regularly as directed by your health care provider. This includes:  Performing resistance training twice a week such as push-ups, sit-ups, lifting weights, or using resistance bands.  Performing 150 minutes of cardio exercises each week such as walking, running, or playing sports.  Staying active and spending no more than 90 minutes at one time being inactive. Even short bursts of exercise are good for you. Three 10-minute sessions spread throughout the day are just as beneficial as a single 30-minute session. Some exercise ideas include:  Taking the dog for a walk.  Taking the stairs instead of the elevator.  Dancing to your favorite song.  Doing an exercise  video.  Doing your favorite exercise with a friend. RECOMMENDATIONS FOR EXERCISING WITH TYPE 1 OR TYPE 2 DIABETES   Check your blood glucose before exercising. If blood glucose levels are greater than 240 mg/dL, check for urine ketones. Do not exercise if ketones are present.  Avoid injecting insulin into areas of the body that are going to be exercised. For example, avoid injecting insulin into:  The arms when playing tennis.  The legs when jogging.  Keep a record of:  Food intake before and after you exercise.  Expected peak times of insulin action.  Blood glucose levels before and after you exercise.  The type and amount of exercise you have done.  Review your records with your health care provider. Your health care provider will help you to develop guidelines for adjusting food intake and insulin amounts before and after exercising.  If you take insulin or oral hypoglycemic agents, watch for signs and symptoms of hypoglycemia. They include:  Dizziness.  Shaking.  Sweating.  Chills.  Confusion.  Drink plenty of water while you exercise to prevent dehydration or heat stroke. Body water is lost during exercise and must be replaced.  Talk to your health care provider before starting an exercise program to make sure it is safe for you. Remember, almost any type of activity is better than none. Document Released: 05/12/2003 Document Revised: 07/06/2013 Document Reviewed: 07/29/2012 ExitCare Patient Information 2015 [employer]. This information is not intended to replace advice given to you by your health care provider. Make sure you discuss any questions you have with your health care provider.  

## 2014-06-14 NOTE — Progress Notes (Signed)
Subjective:    Patient ID: Kelly Barber, female    DOB: 02-18-1966, 49 y.o.   MRN: [id number]  HPI  Kelly Barber had presented at the ED on 06/12/14 with urinary frequency, fatigue, nausea ,abdominal pain and dysuria and had not been checking her sugars had also been out of her insulin for several months given she has not been under the care of her primary care physician and had been unable to afford her medications. Her blood sugar was in the 500s but there was no sign of DKA she also had a UTI for which she received Rocephin and was placed on Macrobid, she also tested positive for trichomoniasis and received 1 dose of Flagyl in the emergency room.  Interval history: She has a hard time obtaining her medications. States she has been stressed at work and has been unable to get time off to go see a physician for the care of her diabetes. She complains of psychosocial issues, financial constraints and the fact that she lives in fear of becoming homeless. She does have glaucoma was previously under the care of an ophthalmologist but when she lost her insurance she was lost to follow-up       Past Medical History  Diagnosis Date  . Immune deficiency disorder     pt states she has no clue what this is  . PVD (peripheral vascular disease)   . PTSD (post-traumatic stress disorder)   . Diabetes mellitus   . Hyperlipidemia   . Anxiety disorder   . Sleep apnea     no test done. dr says needs one  . H/O hiatal hernia   . Fibromyalgia   . Anemia   . PONV (postoperative nausea and vomiting)   . Hypertension     takes Lisinopril and Diovan daily  . Joint pain   . Joint swelling   . GERD (gastroesophageal reflux disease)     doesn't take any meds  . Hyperthyroidism     yrs ago but doesn't require meds for this  . Depression, major     takes wellbutrin and celexa daily  . Anxiety     takes xanax daily  . PTSD (post-traumatic stress disorder)   . Insomnia     Past Surgical History   Procedure Laterality Date  . Amputation      lft foot toes 1.2.3  . Femoral bypass  04/29/2008    left  . Toe amputation  07/12/2008     left foot toes 1,2,3  . Aortogram    . Cholecystectomy  1990  . Tubal ligation    . Femoral-popliteal bypass graft  08/27/2011    Procedure: BYPASS GRAFT FEMORAL-POPLITEAL ARTERY;  Surgeon: Sherren Kerns, MD;  Location: [facility] OR;  Service: Vascular;  Laterality: Right;  . Femoral-popliteal bypass graft  08/29/2011    Procedure: BYPASS GRAFT FEMORAL-POPLITEAL ARTERY;  Surgeon: Sherren Kerns, MD;  Location: [facility] OR;  Service: Vascular;  Laterality: Right;  Vein Patch Angionplasty and Thrombectomy   . Intraoperative arteriogram  08/29/2011    Procedure: INTRA OPERATIVE ARTERIOGRAM;  Surgeon: Sherren Kerns, MD;  Location: [facility] OR;  Service: Vascular;  Laterality: Right;  . Vascular surgery    . Colonoscopy    . Dilation and curettage of uterus    . Amputation  01/07/2012    Procedure: AMPUTATION RAY;  Surgeon: Sherren Kerns, MD;  Location: [facility] OR;  Service: Vascular;  Laterality: Right;  4TH & 5TH toes  . Abdominal  aortagram N/A 08/10/2011    Procedure: ABDOMINAL Ronny FlurryAORTAGRAM;  Surgeon: Sherren Kernsharles E Fields, MD;  [facility];  Service: Cardiovascular;  Laterality: N/A;  . Lower extremity angiogram N/A 08/10/2011    Procedure: LOWER EXTREMITY ANGIOGRAM;  Surgeon: Sherren Kernsharles E Fields, MD;  [facility];  Service: Cardiovascular;  Laterality: N/A;    History   Social History  . Marital Status: Divorced    Spouse Name: N/A  . Number of Children: N/A  . Years of Education: N/A   Occupational History  . Not on file.   Social History Main Topics  . Smoking status: Former Smoker -- 1.00 packs/day for 10 years    Types: Cigarettes    Quit date: 07/25/2008  . Smokeless tobacco: Never Used     Comment: quit 2436yrs ago  . Alcohol Use: No  . Drug Use: No  . Sexual Activity: Not Currently   Other Topics Concern  . Not on file   Social History  Narrative    Allergies  Allergen Reactions  . Amoxicillin-Pot Clavulanate Shortness Of Breath  . Codeine Anaphylaxis  . Darvocet [Propoxyphene N-Acetaminophen] Anaphylaxis    Plain Tylenol ok  . Percocet [Oxycodone-Acetaminophen] Anaphylaxis    Plain Tylenol ok  . Statins Other (See Comments)    Joint pain    Current Outpatient Prescriptions on File Prior to Visit  Medication Sig Dispense Refill  . acetaminophen (TYLENOL) 325 MG tablet Take 325 mg by mouth every 6 (six) hours as needed for mild pain or moderate pain.    Marland Kitchen. insulin aspart (NOVOLOG) 100 UNIT/ML injection Inject 10 Units into the skin once. 10 mL 0  . nitrofurantoin, macrocrystal-monohydrate, (MACROBID) 100 MG capsule Take 1 capsule (100 mg total) by mouth 2 (two) times daily. 10 capsule 0   No current facility-administered medications on file prior to visit.       Review of Systems  General: negative for fever, weight loss, appetite change Eyes: no visual symptoms. ENT: no ear symptoms, no sinus tenderness, no nasal congestion or sore throat. Neck: no pain  Respiratory: no wheezing, shortness of breath, cough Cardiovascular: no chest pain, no dyspnea on exertion, no pedal edema, no orthopnea. Gastrointestinal: no abdominal pain, no diarrhea, no constipation Genito-Urinary: no urinary frequency, no dysuria, no polyuria. Hematologic: no bruising Endocrine: no cold or heat intolerance Neurological: no headaches, no seizures, no tremors Musculoskeletal: no joint pains, no joint swelling Skin: no pruritus, no rash. Psychological: admits depression, no suicidal ideation.       Objective:  Filed Vitals:   06/14/14 1013  BP: 122/85  Pulse: 84  Temp: 98.6 F (37 C)  Resp: 18      Physical Exam  Constitutional: normal appearing,  Eyes: PERRLA HENT: Head is atraumatic, normal sinuses, normal oropharynx, normal appearing tonsils and palate Neck: normal range of motion, no thyromegaly, no  JVD cardiovascular: normal rate and rhythm, normal heart sounds, no murmurs, rub or gallop, no pedal edema, weak dorsalis pedis bilaterally Respiratory: clear to auscultation bilaterally, no wheezes, no rales, no rhonchi Abdomen: soft, not tender to palpation, normal bowel sounds, no enlarged organs Extremities: Full ROM, no tenderness in joints, left partial foot amputation and right foot with amputation of middle two toes Skin: warm and dry, no lesions. Neurological: alert, oriented x3, cranial nerves I-XII grossly intact Psychological: depressedmood.         Assessment & Plan:  49 year old female patient with a history of uncontrolled diabetes mellitus complicated by amputations, hypertension who  has not had a primary care physician in over 2 years.  Type 2 diabetes mellitus: Uncontrolled with A1c of 9.1 and CBG of 302 today  I will administer 10 units of NovoLog and repeat her blood sugar after 30 minutes Microalbumin, lipids Pneumovax ordered today and I have referred her to ophthalmology.  Hypertension: Uncontrolled, losartan added to her regimen and she will need a reassessment of her blood pressure at her next office visit. Low-sodium diet advised.  Depression: PH Q9 score 23 and GAD 7 score of 21. She has got some psychosocial factors which influenced this: As she has complained about being on the verge homelessness and she is overall well of her medical issues including the fact she was treated for Trichomonas at the ED and she was not aware of it and is troubled because she would have to inform her partner. She is being seen today by the behavioral health clinician and she will be reassessed at her next office visit for theneed for antidepressants.  Glaucoma. I have referred her to ophthalmology.

## 2014-06-15 DIAGNOSIS — H409 Unspecified glaucoma: Secondary | ICD-10-CM | POA: Insufficient documentation

## 2014-06-16 ENCOUNTER — Other Ambulatory Visit: Payer: Self-pay | Admitting: Family Medicine

## 2014-06-16 ENCOUNTER — Ambulatory Visit: Payer: Self-pay | Attending: Family Medicine

## 2014-06-16 ENCOUNTER — Encounter: Payer: Self-pay | Admitting: *Deleted

## 2014-06-16 DIAGNOSIS — E118 Type 2 diabetes mellitus with unspecified complications: Secondary | ICD-10-CM

## 2014-06-16 LAB — LIPID PANEL
Cholesterol: 197 mg/dL (ref 0–200)
HDL: 30 mg/dL — ABNORMAL LOW (ref >=46)
LDL Cholesterol: 125 mg/dL — ABNORMAL HIGH (ref 0–99)
Total CHOL/HDL Ratio: 6.6 Ratio
Triglycerides: 211 mg/dL — ABNORMAL HIGH (ref <150)
VLDL: 42 mg/dL — ABNORMAL HIGH (ref 0–40)

## 2014-06-17 ENCOUNTER — Other Ambulatory Visit: Payer: Self-pay | Admitting: Family Medicine

## 2014-06-17 ENCOUNTER — Telehealth: Payer: Self-pay

## 2014-06-17 DIAGNOSIS — E785 Hyperlipidemia, unspecified: Secondary | ICD-10-CM

## 2014-06-17 MED ORDER — EZETIMIBE 10 MG PO TABS: 10.0000 mg | ORAL_TABLET | Freq: Every day | ORAL | Status: DC

## 2014-06-17 NOTE — Telephone Encounter (Signed)
-----   Message from Jaclyn ShaggyEnobong Amao, MD sent at 06/17/2014 10:05 AM EDT ----- She has elevated LDL ( bad cholesterol) and HDL (good cholesterol) is low but she is allergic to statins and so I am placing her on Zetia.

## 2014-06-17 NOTE — Telephone Encounter (Signed)
-----   Message from Enobong Amao, MD sent at 06/17/2014 10:05 AM EDT ----- She has elevated LDL ( bad cholesterol) and HDL (good cholesterol) is low but she is allergic to statins and so I am placing her on Zetia. 

## 2014-06-17 NOTE — Progress Notes (Signed)
Elevated LDL but she is allergic to statins and so I am placing her on Zetia.

## 2014-06-17 NOTE — Telephone Encounter (Signed)
Nurse called patient, patient verified date of birth. Nurse explained to patient LDL, bad cholesterol, is elevated and HDL, good cholesterol, is low. Patient agreed with statin allergy listed on chart. [facility] and work note is with front office staff.

## 2014-06-21 ENCOUNTER — Telehealth: Payer: Self-pay | Admitting: Clinical

## 2014-06-21 NOTE — Telephone Encounter (Signed)
Left message to call back [place] @ [phone]

## 2014-06-25 ENCOUNTER — Telehealth: Payer: Self-pay | Admitting: Clinical

## 2014-06-25 NOTE — Telephone Encounter (Signed)
Second attempt at f/u call 

## 2014-07-05 ENCOUNTER — Ambulatory Visit: Payer: Self-pay | Admitting: Internal Medicine

## 2015-11-08 NOTE — Telephone Encounter (Signed)
Encounter opened in error

## 2016-12-21 ENCOUNTER — Other Ambulatory Visit (HOSPITAL_COMMUNITY): Payer: Self-pay | Admitting: General Surgery

## 2017-01-17 ENCOUNTER — Ambulatory Visit (HOSPITAL_COMMUNITY)
Admission: RE | Admit: 2017-01-17 | Discharge: 2017-01-17 | Disposition: A | Discharge status: Home / Self Care | Payer: BLUE CROSS/BLUE SHIELD | Source: Ambulatory Visit | Attending: General Surgery | Admitting: General Surgery

## 2017-01-17 ENCOUNTER — Other Ambulatory Visit: Payer: Self-pay

## 2017-01-29 ENCOUNTER — Encounter (INDEPENDENT_AMBULATORY_CARE_PROVIDER_SITE_OTHER): Payer: Self-pay

## 2017-01-29 ENCOUNTER — Ambulatory Visit: Payer: BLUE CROSS/BLUE SHIELD | Admitting: Neurology

## 2017-01-29 ENCOUNTER — Encounter: Payer: Self-pay | Admitting: Neurology

## 2017-01-29 VITALS — BP 145/87 | HR 84 | Ht 67.0 in | Wt 239.5 lb

## 2017-01-29 DIAGNOSIS — R351 Nocturia: Secondary | ICD-10-CM

## 2017-01-29 DIAGNOSIS — I739 Peripheral vascular disease, unspecified: Secondary | ICD-10-CM

## 2017-01-29 DIAGNOSIS — E1165 Type 2 diabetes mellitus with hyperglycemia: Secondary | ICD-10-CM | POA: Diagnosis not present

## 2017-01-29 DIAGNOSIS — R0681 Apnea, not elsewhere classified: Secondary | ICD-10-CM | POA: Diagnosis not present

## 2017-01-29 DIAGNOSIS — E669 Obesity, unspecified: Secondary | ICD-10-CM

## 2017-01-29 DIAGNOSIS — K219 Gastro-esophageal reflux disease without esophagitis: Secondary | ICD-10-CM | POA: Diagnosis not present

## 2017-01-29 DIAGNOSIS — G4719 Other hypersomnia: Secondary | ICD-10-CM

## 2017-01-29 DIAGNOSIS — R0683 Snoring: Secondary | ICD-10-CM | POA: Diagnosis not present

## 2017-01-29 NOTE — Patient Instructions (Addendum)

## 2017-01-29 NOTE — Progress Notes (Signed)
Subjective:    Patient ID: Kelly Barber is a 51 y.o. female.  HPI     Kelly FoleySaima Annemarie Sebree, MD, PhD [facility] [address], [address] [address] [place], [place] [id number]  Dear Dr. Sheliah HatchKinsinger,   I saw your patient, Kelly Barber, upon your kind request in my neurologic clinic today for initial consultation of her sleep disorder, in particular, concern for underlying obstructive sleep apnea. The patient is unaccompanied today. As you know, Kelly Barber is a 51 year old right-handed woman with an underlying medical history of hypertension, hyperlipidemia, high parathyroidism, reflux disease, poorly controlled diabetes (last hemoglobin A1c about a year ago around 15 per patient report, per chart review A1c was about 15 some years ago), anxiety, depression, anemia, peripheral vascular disease, s/p fem-pop bypass x 3, s/p 5 total toe amputations, history of PTSD (by chart review and depression and anxiety) obesity, who reports snoring and excessive daytime sleepiness. I reviewed your office note from 11/28/2016, which you kindly included. She is being evaluated for bariatric surgery. Her Epworth sleepiness score is 15 out of 24, fatigue score is 62 out of 63. She lives with her son and her daughter. She quit smoking 8 years ago and does not utilize alcohol. She drinks caffeine in the form of soda, about 2 per day. Trying to reduce tea.  She sees Dr. Evelene CroonKaur for PTSD, anxiety and depression. She is on Wellbutrin 150 mg once daily long-acting, Trentellix 20 mg daily and Xanax 1 mg up to 4 times a day as needed She sleeps with the TV on, which helps for her to be distracted at night, has 2 small dogs in the bed. She has been told she has apneas and Dr. Dione BoozeGroat, eye doctor has told her he has concerns she has OSA. 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 She has not had frequent morning headaches. She has nocturia about once or twice per  average night. She does not wake up rested. She has a high stress job. She also takes care of her of her 52 year old son who lives with her and is autistic, nonverbal. She has a 52 year old daughter. Her bedtime is around 7 or 8 PM, wakeup time around 6:30 or 7.   Her Past Medical History Is Significant For: Past Medical History:  Diagnosis Date  . Anemia   . Anxiety    takes xanax daily  . Anxiety disorder   . Depression, major    takes wellbutrin and celexa daily  . Diabetes mellitus   . Fibromyalgia   . GERD (gastroesophageal reflux disease)    doesn't take any meds  . H/O hiatal hernia   . Hyperlipidemia   . Hypertension    takes Lisinopril and Diovan daily  . Hyperthyroidism    yrs ago but doesn't require meds for this  . Immune deficiency disorder (HCC)    pt states she has no clue what this is  . Insomnia   . Joint pain   . Joint swelling   . PONV (postoperative nausea and vomiting)   . PTSD (post-traumatic stress disorder)   . PTSD (post-traumatic stress disorder)   . PVD (peripheral vascular disease) (HCC)   . Sleep apnea    no test done. dr says needs one    Her Past Surgical History Is Significant For: Past Surgical History:  Procedure Laterality Date  . ABDOMINAL AORTAGRAM N/A 08/10/2011   Procedure: ABDOMINAL Ronny FlurryAORTAGRAM;  Surgeon: Sherren Kernsharles E Fields, MD;  Location: [facility] CATH LAB;  Service: Cardiovascular;  Laterality: N/A;  . AMPUTATION     lft foot toes 1.2.3  . AMPUTATION  01/07/2012   Procedure: AMPUTATION RAY;  Surgeon: Sherren Kerns, MD;  Location: [facility] OR;  Service: Vascular;  Laterality: Right;  4TH & 5TH toes  . aortogram    . CHOLECYSTECTOMY  1990  . COLONOSCOPY    . DILATION AND CURETTAGE OF UTERUS    . FEMORAL BYPASS  04/29/2008   left  . FEMORAL-POPLITEAL BYPASS GRAFT  08/27/2011   Procedure: BYPASS GRAFT FEMORAL-POPLITEAL ARTERY;  Surgeon: Sherren Kerns, MD;  Location[facility] OR;  Service: Vascular;  Laterality: Right;  . FEMORAL-POPLITEAL BYPASS  GRAFT  08/29/2011   Procedure: BYPASS GRAFT FEMORAL-POPLITEAL ARTERY;  Surgeon: Sherren Kerns, MD;  Location: [facility] OR;  Service: Vascular;  Laterality: Right;  Vein Patch Angionplasty and Thrombectomy   . INTRAOPERATIVE ARTERIOGRAM  08/29/2011   Procedure: INTRA OPERATIVE ARTERIOGRAM;  Surgeon: Sherren Kerns, MD;  Location: [facility] OR;  Service: Vascular;  Laterality: Right;  . LOWER EXTREMITY ANGIOGRAM N/A 08/10/2011   Procedure: LOWER EXTREMITY ANGIOGRAM;  Surgeon: Sherren Kerns, MD;  Location: [facility] CATH LAB;  Service: Cardiovascular;  Laterality: N/A;  . TOE AMPUTATION  07/12/2008    left foot toes 1,2,3  . TUBAL LIGATION    . VASCULAR SURGERY      Her Family History Is Significant For: Family History  Problem Relation Age of Onset  . Diabetes Mother   . Hyperlipidemia Mother   . Hypertension Mother   . Diabetes Father   . Heart disease Father        before age 23  . Hypertension Father   . Hyperlipidemia Father   . Hypertension Sister   . Diabetes Brother     Her Social History Is Significant For: Social History   Socioeconomic History  . Marital status: Divorced    Spouse name: None  . Number of children: None  . Years of education: None  . Highest education level: None  Social Needs  . Financial resource strain: None  . Food insecurity - worry: None  . Food insecurity - inability: None  . Transportation needs - medical: None  . Transportation needs - non-medical: None  Occupational History  . None  Tobacco Use  . Smoking status: Former Smoker    Packs/day: 1.00    Years: 10.00    Pack years: 10.00    Types: Cigarettes    Last attempt to quit: 07/25/2008    Years since quitting: 8.5  . Smokeless tobacco: Never Used  . Tobacco comment: quit 33yrs ago  Substance and Sexual Activity  . Alcohol use: No  . Drug use: No  . Sexual activity: Not Currently  Other Topics Concern  . None  Social History Narrative  . None    Her Allergies Are:  Allergies  Allergen  Reactions  . Amoxicillin-Pot Clavulanate Shortness Of Breath  . Codeine Anaphylaxis  . Darvocet [Propoxyphene N-Acetaminophen] Anaphylaxis    Plain Tylenol ok  . Percocet [Oxycodone-Acetaminophen] Anaphylaxis    Plain Tylenol ok  . Statins Other (See Comments)    Joint pain  :   Her Current Medications Are:  Outpatient Encounter Medications as of 01/29/2017  Medication Sig  . acetaminophen (TYLENOL) 325 MG tablet Take 325 mg by mouth every 6 (six) hours as needed for mild pain or moderate pain.  Marland Kitchen amphetamine-dextroamphetamine (ADDERALL) 20 MG tablet   . ezetimibe (ZETIA) 10  MG tablet Take 1 tablet (10 mg total) by mouth daily.  . insulin aspart (NOVOLOG) 100 UNIT/ML injection Inject 10 Units into the skin once.  . insulin aspart (NOVOLOG) 100 UNIT/ML injection Inject 15 Units into the vein 3 (three) times daily with meals.  . Insulin Glargine (LANTUS SOLOSTAR) 100 UNIT/ML Solostar Pen Inject 10 Units into the skin daily at 10 pm.  . losartan (COZAAR) 50 MG tablet Take 1 tablet (50 mg total) by mouth daily.  . nitrofurantoin, macrocrystal-monohydrate, (MACROBID) 100 MG capsule Take 1 capsule (100 mg total) by mouth 2 (two) times daily.   No facility-administered encounter medications on file as of 01/29/2017.   : Review of Systems:  Out of a complete 14 point review of systems, all are reviewed and negative with the exception of these symptoms as listed below:  Review of Systems  Neurological:       Patient here to discuss her snoring and possible sleep apnea. She is trying to prepare for gastric bypass surgery.   Epworth Sleepiness Scale 0= would never doze 1= slight chance of dozing 2= moderate chance of dozing 3= high chance of dozing  Sitting and reading: 3  Watching TV: 3 Sitting inactive in a public place (ex. Theater or meeting): 2 As a passenger in a car for an hour without a break:0 Lying down to rest in the afternoon:3 Sitting and talking to someone:1 Sitting  quietly after lunch (no alcohol):3 In a car, while stopped in traffic:0 Total:15    Objective:  Neurological Exam  Physical Exam Physical Examination:   Vitals:   01/29/17 1544  BP: (!) 145/87  Pulse: 84    General Examination: The patient is a very pleasant 51 y.o. female in no acute distress. She appears well-developed and well-nourished and well groomed.   HEENT: Normocephalic, atraumatic, pupils are equal, round and reactive to light and accommodation. Wig in place. Extraocular tracking is good without limitation to gaze excursion or nystagmus noted. Normal smooth pursuit is noted. Hearing is grossly intact. Face is symmetric with normal facial animation and normal facial sensation. Speech is clear with no dysarthria noted. There is no hypophonia. There is no lip, neck/head, jaw or voice tremor. Neck is supple with full range of passive and active motion. There are no carotid bruits on auscultation. Oropharynx exam reveals: moderate mouth dryness, adequate dental hygiene and moderate airway crowding, due to larger tongue, tonsils of 2+, large uvula. Mallampati is class II. Neck circumference is 17-1/2 inches. Tongue protrudes centrally and palate elevates symmetrically.   Chest: Clear to auscultation without wheezing, rhonchi or crackles noted.  Heart: S1+S2+0, regular and normal without murmurs, rubs or gallops noted.   Abdomen: Soft, non-tender and non-distended with normal bowel sounds appreciated on auscultation.  Extremities: There is trace pitting edema in the distal lower extremities bilaterally. Pedal pulses are intact.  Skin: Warm and dry without trophic changes noted.  Musculoskeletal: exam reveals no obvious joint deformities, tenderness or joint swelling or erythema, status post toe amputations bilaterally.   Neurologically:  Mental status: The patient is awake, alert and oriented in all 4 spheres. Her immediate and remote memory, attention, language skills and fund  of knowledge are appropriate. There is no evidence of aphasia, agnosia, apraxia or anomia. Speech is clear with normal prosody and enunciation. Thought process is linear. Mood is normal and affect is normal.  Cranial nerves II - XII are as described above under HEENT exam. In addition: shoulder shrug is normal with equal  shoulder height noted. Motor exam: Normal bulk, strength and tone is noted. There is no drift, tremor or rebound. Romberg is negative. Reflexes are 1+ throughout. Fine motor skills and coordination: intact with normal finger taps, normal hand movements, normal rapid alternating patting, normal foot taps and normal foot agility.  Cerebellar testing: No dysmetria or intention tremor on finger to nose testing. Heel to shin is unremarkable bilaterally. There is no truncal or gait ataxia.  Sensory exam: intact to light touch in the upper and lower extremities.  Gait, station and balance: She stands easily. No veering to one side is noted. No leaning to one side is noted. Posture is age-appropriate and stance is narrow based. Gait shows normal stride length and normal pace.  Assessment and Plan:  In summary, CRESTA RIDEN is a very pleasant 51 y.o.-year old female with an underlying complex medical history of hypertension, hyperlipidemia, high parathyroidism, reflux disease, poorly controlled diabetes (last hemoglobin A1c about a year ago around 15 per patient report, per chart review A1c was about 15 some years ago), anxiety, depression, anemia, peripheral vascular disease, s/p fem-pop bypass x 3, s/p 5 total toe amputations, history of PTSD (by chart review and depression and anxiety) obesity, whose history and physical exam are concerning for obstructive sleep apnea (OSA). I had a long chat with the patient about my findings and the diagnosis of OSA, its prognosis and treatment options. We talked about medical treatments, surgical interventions and non-pharmacological approaches. I explained  in particular the risks and ramifications of untreated moderate to severe OSA, especially with respect to developing cardiovascular disease down the Road, including congestive heart failure, difficult to treat hypertension, cardiac arrhythmias, or stroke. Even type 2 diabetes has, in part, been linked to untreated OSA. Symptoms of untreated OSA include daytime sleepiness, memory problems, mood irritability and mood disorder such as depression and anxiety, lack of energy, as well as recurrent headaches, especially morning headaches. We talked about trying to maintain a healthy lifestyle in general, as well as the importance of weight control. I encouraged the patient to eat healthy, exercise daily and keep well hydrated, to keep a scheduled bedtime and wake time routine, to not skip any meals and eat healthy snacks in between meals. I advised the patient not to drive when feeling sleepy. I recommended the following at this time: sleep study with potential positive airway pressure titration. (We will score hypopneas at 3%).   I explained the sleep test procedure to the patient and also outlined possible surgical and non-surgical treatment options of OSA, including the use of a custom-made dental device (which would require a referral to a specialist dentist or oral surgeon), upper airway surgical options, such as pillar implants, radiofrequency surgery, tongue base surgery, and UPPP (which would involve a referral to an ENT surgeon). Rarely, jaw surgery such as mandibular advancement may be considered.  I also explained the CPAP treatment option to the patient, who indicated that she would be willing to try CPAP if the need arises. I explained the importance of being compliant with PAP treatment, not only for insurance purposes but primarily to improve Her symptoms, and for the patient's long term health benefit, including to reduce Her cardiovascular risks. I answered all her questions today and the patient was  in agreement. I would like to see her back after the sleep study is completed and encouraged her to call with any interim questions, concerns, problems or updates.   Thank you very much for allowing me  to participate in the care of this nice patient. If I can be of any further assistance to you please do not hesitate to call me at [phone].  Sincerely,   Star Age, MD, PhD

## 2017-01-31 ENCOUNTER — Encounter: Payer: BLUE CROSS/BLUE SHIELD | Attending: General Surgery | Admitting: Skilled Nursing Facility1

## 2017-01-31 ENCOUNTER — Encounter: Payer: Self-pay | Admitting: Skilled Nursing Facility1

## 2017-01-31 DIAGNOSIS — Z713 Dietary counseling and surveillance: Secondary | ICD-10-CM | POA: Insufficient documentation

## 2017-01-31 DIAGNOSIS — E119 Type 2 diabetes mellitus without complications: Secondary | ICD-10-CM

## 2017-01-31 DIAGNOSIS — Z6838 Body mass index (BMI) 38.0-38.9, adult: Secondary | ICD-10-CM | POA: Diagnosis not present
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
## 2017-02-01 ENCOUNTER — Telehealth: Payer: Self-pay

## 2017-02-01 DIAGNOSIS — G4719 Other hypersomnia: Secondary | ICD-10-CM

## 2017-02-01 NOTE — Telephone Encounter (Signed)
[employer] denied in lab study, need HST order. 

## 2017-02-04 NOTE — Telephone Encounter (Signed)
HST order placed. 

## 2017-02-13 ENCOUNTER — Ambulatory Visit: Payer: BLUE CROSS/BLUE SHIELD | Admitting: Skilled Nursing Facility1

## 2017-02-14 ENCOUNTER — Encounter: Payer: Self-pay | Admitting: Skilled Nursing Facility1

## 2017-02-14 ENCOUNTER — Encounter: Payer: BLUE CROSS/BLUE SHIELD | Attending: General Surgery | Admitting: Skilled Nursing Facility1

## 2017-02-14 DIAGNOSIS — Z713 Dietary counseling and surveillance: Secondary | ICD-10-CM | POA: Insufficient documentation

## 2017-02-14 DIAGNOSIS — Z6838 Body mass index (BMI) 38.0-38.9, adult: Secondary | ICD-10-CM | POA: Diagnosis not present

## 2017-02-14 DIAGNOSIS — E119 Type 2 diabetes mellitus without complications: Secondary | ICD-10-CM
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
## 2017-03-05 HISTORY — PX: BARIATRIC SURGERY: SHX1103

## 2017-03-13 ENCOUNTER — Ambulatory Visit (INDEPENDENT_AMBULATORY_CARE_PROVIDER_SITE_OTHER): Payer: BLUE CROSS/BLUE SHIELD | Admitting: Neurology

## 2017-03-13 DIAGNOSIS — R0683 Snoring: Secondary | ICD-10-CM

## 2017-03-13 DIAGNOSIS — G4720 Circadian rhythm sleep disorder, unspecified type: Secondary | ICD-10-CM

## 2017-03-13 DIAGNOSIS — R351 Nocturia: Secondary | ICD-10-CM

## 2017-03-13 DIAGNOSIS — G4733 Obstructive sleep apnea (adult) (pediatric): Secondary | ICD-10-CM

## 2017-03-13 DIAGNOSIS — I739 Peripheral vascular disease, unspecified: Secondary | ICD-10-CM

## 2017-03-13 DIAGNOSIS — R0681 Apnea, not elsewhere classified: Secondary | ICD-10-CM

## 2017-03-13 DIAGNOSIS — E669 Obesity, unspecified: Secondary | ICD-10-CM

## 2017-03-13 DIAGNOSIS — E1165 Type 2 diabetes mellitus with hyperglycemia: Secondary | ICD-10-CM

## 2017-03-13 DIAGNOSIS — G4719 Other hypersomnia: Secondary | ICD-10-CM

## 2017-03-13 DIAGNOSIS — K219 Gastro-esophageal reflux disease without esophagitis: Secondary | ICD-10-CM

## 2017-03-18 ENCOUNTER — Other Ambulatory Visit: Payer: Self-pay | Admitting: Neurology

## 2017-03-18 DIAGNOSIS — G4733 Obstructive sleep apnea (adult) (pediatric): Secondary | ICD-10-CM

## 2017-03-18 NOTE — Progress Notes (Signed)
Patient referred by Dr. Sheliah HatchKinsinger in surgery, seen by me on 01/29/17, split night sleep study on 03/13/17. Please call and notify patient that the recent sleep study confirmed the diagnosis of severe OSA. She did well with CPAP during the study with significant improvement of the respiratory events. Therefore, I would like start the patient on CPAP therapy at home by prescribing a machine for home use. I placed the order in the chart.  Please advise patient that we need a follow up appointment with either myself or one of our nurse practitioners in about 10 weeks post set-up to check for how the patient is feeling and how well the patient is using the machine, etc. Please go ahead and schedule the appointment, while you have the patient on the phone and make sure patient understands the importance of keeping this window for the FU appointment, as it is often an insurance requirement. Failing to adhere to this may result in losing coverage for sleep apnea treatment, at which point most patients are left with a choice of returning the machine or paying out of pocket (and we want neither of this to happen!).  Please re-enforce the importance of compliance with treatment and the need for us to monitor compliance data - again an insurance requirement and usually a good feedback for the patient as far as how they are doing.  Also remind patient, that any PAP machine or mask issues should be first addressed with the DME company, who provided the machine/mask.  Please ask if patient has a preference regarding DME company, may depend on the insurance too.  Please arrange for CPAP set up at home through a DME company of patient's choice.  Once you have spoken to the patient you can close the phone encounter. Please fax/route report to referring provider, thanks,   Huston FoleySaima Greyson Peavy, MD, PhD [facility]

## 2017-03-18 NOTE — Procedures (Signed)
PATIENT'S NAME:  Kelly Barber, Kelly Barber DOB:      1965-08-26      MR#:    [id number]     DATE OF RECORDING: 03/13/2017 REFERRING M.D.:  De Blanch Kinsinger, MD Study Performed:  Split-Night Titration Study HISTORY: 52 year old woman with a history of hypertension, hyperlipidemia, hyperparathyroidism, reflux disease, diabetes , anxiety, depression, anemia, peripheral vascular disease, depression, anxiety and obesity, who reports snoring and excessive daytime sleepiness. The patient endorsed the Epworth Sleepiness Scale at 15/24 points. The patient's weight 239 pounds with a height of 67 (inches), resulting in a BMI of 37.4 kg/m2. The patient's neck circumference measured 17.5 inches.  CURRENT MEDICATIONS: Tylenol, Adderall, Zetia, Novolog, Lantus, Cozaar, Macrobid.  PROCEDURE:  This is a multichannel digital polysomnogram utilizing the Somnostar 11.2 system.  Electrodes and sensors were applied and monitored per AASM Specifications.   EEG, EOG, Chin and Limb EMG, were sampled at 200 Hz.  ECG, Snore and Nasal Pressure, Thermal Airflow, Respiratory Effort, CPAP Flow and Pressure, Oximetry was sampled at 50 Hz. Digital video and audio were recorded.      BASELINE STUDY WITHOUT CPAP RESULTS:  Lights Out was at 00:02 and Lights On at 06:32 for the night, split study start at 01:34 AM, epoch 335. Total recording time (TRT) was 164.5, with a total sleep time (TST) of 123 minutes.   The patient's sleep latency was 28.5 minutes, which is delayed. REM sleep was absent. The sleep efficiency was 74.8 %.    SLEEP ARCHITECTURE: WASO (Wake after sleep onset) was 4 minutes, Stage N1 was 19.5 minutes, Stage N2 was 103.5 minutes, Stage N3 was 0 minutes and Stage R (REM sleep) was 0 minutes.  The percentages were Stage N1 15.9%, Stage N2 84.1%, both increased and Stage N3 and Stage R (REM sleep) were absent.  The arousals were noted as: 11 were spontaneous, 0 were associated with PLMs, 54 were associated with respiratory  events.  Audio and video analysis did not show any abnormal or unusual movements, behaviors, phonations or vocalizations. The patient took 1 bathroom break for the night. Moderate to loud snoring was noted. The EKG was in keeping with normal sinus rhythm (NSR).  RESPIRATORY ANALYSIS:  There were a total of 149 respiratory events:  20 obstructive apneas, 0 central apneas and 0 mixed apneas with a total of 20 apneas and an apnea index (AI) of 9.8. There were 129 hypopneas with a hypopnea index of 62.9. The patient also had 0 respiratory event related arousals (RERAs).  Snoring was noted.     The total APNEA/HYPOPNEA INDEX (AHI) was 72.7 /hour and the total RESPIRATORY DISTURBANCE INDEX was 72.7 /hour.  0 events occurred in REM sleep and 258 events in NREM. The REM AHI was 0, /hour versus a non-REM AHI of 72.7 /hour. The patient spent 215 minutes sleep time in the supine position 123 minutes in non-supine. The supine AHI was 0.0 /hour versus a non-supine AHI of 72.7 /hour.  OXYGEN SATURATION & C02:  The wake baseline 02 saturation was 92%, with the lowest being 75%. Time spent below 89% saturation equaled 113 minutes.  PERIODIC LIMB MOVEMENTS: The patient had a total of 0 Periodic Limb Movements.  The Periodic Limb Movement (PLM) index was 0 /hour and the PLM Arousal index was 0 /hour.  TITRATION STUDY WITH CPAP RESULTS:   The patient was fitted with a medium Simplus FFM (due to mouth breathing noted). CPAP was initiated at 5 cm H20 with heated humidity per AASM  split night standards and pressure was advanced to 13 cmH20 because of hypopneas, apneas and desaturations. At a PAP pressure of 13 cmH20, there was a reduction of the AHI to 0/hour with supine NREM sleep achieved and O2 nadir of 92%. Supine REM sleep was achieved on a pressure of 11 cm (O2 nadir of 89% and AHI of 1.6/hour).   Total recording time (TRT) was 226 minutes, with a total sleep time (TST) of 215 minutes. The patient's sleep latency  was 10 minutes. REM latency was 107 minutes.  The sleep efficiency was 95.1 %.    SLEEP ARCHITECTURE: Wake after sleep was 0.5 minutes, Stage N1 4 minutes, Stage N2 187.5 minutes, Stage N3 0 minutes and Stage R (REM sleep) 23.5 minutes. The percentages were: Stage N1 1.9%, Stage N2 87.2%, Stage N3 was absent, and Stage R (REM sleep) was 10.9%.  The arousals were noted as: 4 were spontaneous, 0 were associated with PLMs, 5 were associated with respiratory events.  RESPIRATORY ANALYSIS:  There were a total of 18 respiratory events: 1 obstructive apneas, 1 central apneas and 0 mixed apneas with a total of 2 apneas and an apnea index (AI) of .6. There were 16 hypopneas with a hypopnea index of 4.5 /hour. The patient also had 0 respiratory event related arousals (RERAs).      The total APNEA/HYPOPNEA INDEX  (AHI) was 5. /hour and the total RESPIRATORY DISTURBANCE INDEX was 5. /hour.  1 events occurred in REM sleep and 17 events in NREM. The REM AHI was 2.6 /hour versus a non-REM AHI of 5.3 /hour. REM sleep was achieved on a pressure of  cm/h2o (AHI was  .) The patient spent 100% of total sleep time in the supine position. The supine AHI was 5.1 /hour, versus a non-supine AHI of 0.0/hour.  OXYGEN SATURATION & C02:  The wake baseline 02 saturation was 93%, with the lowest being 76%. Time spent below 89% saturation equaled 11 minutes.  PERIODIC LIMB MOVEMENTS: The patient had a total of 0 Periodic Limb Movements. The Periodic Limb Movement (PLM) index was 0 /hour and the PLM Arousal index was 0 /hour.  Post-study, the patient indicated that sleep was better than usual.  POLYSOMNOGRAPHY IMPRESSION :   1. Obstructive Sleep Apnea (OSA)  2. Dysfunctions associated with sleep stages or arousals from sleep   RECOMMENDATIONS:  1. This patient has severe obstructive sleep apnea and responded well on CPAP therapy. I will, therefore, start the patient on home CPAP treatment at a pressure of 13 cm via medium FFM,  with heated humidity. The patient should be reminded to be fully compliant with PAP therapy to improve sleep related symptoms and decrease long term cardiovascular risks. Please note that untreated obstructive sleep apnea carries additional perioperative morbidity. Patients with significant obstructive sleep apnea should receive perioperative PAP therapy and the surgeons and particularly the anesthesiologist should be informed of the diagnosis and the severity of the sleep disordered breathing. 2. This study shows sleep fragmentation and abnormal sleep stage percentages; these are nonspecific findings and per se do not signify an intrinsic sleep disorder or a cause for the patient's sleep-related symptoms. Causes include (but are not limited to) the first night effect of the sleep study, circadian rhythm disturbances, medication effect or an underlying mood disorder or medical problem.  3. The patient should be cautioned not to drive, work at heights, or operate dangerous or heavy equipment when tired or sleepy. Review and reiteration of good sleep hygiene measures  should be pursued with any patient. 4. The patient will be seen in follow-up by Dr. Frances Furbish at [facility] for discussion of the test results and further management strategies. The referring provider will be notified of the test results.  I certify that I have reviewed the entire raw data recording prior to the issuance of this report in accordance with the Standards of Accreditation of the American Academy of Sleep Medicine (AASM)   Huston Foley, MD, PhD Diplomat, American Board of Psychiatry and Neurology (Neurology and Sleep Medicine)

## 2017-03-19 ENCOUNTER — Telehealth: Payer: Self-pay

## 2017-03-19 NOTE — Telephone Encounter (Signed)
I called pt to discuss her sleep study results. No answer, left a message asking her to call me back. 

## 2017-03-19 NOTE — Telephone Encounter (Signed)
-----   Message from Huston FoleySaima Athar, MD sent at 03/18/2017 10:04 AM EST ----- Patient referred by Dr. Sheliah HatchKinsinger in surgery, seen by me on 01/29/17, split night sleep study on 03/13/17. Please call and notify patient that the recent sleep study confirmed the diagnosis of severe OSA. She did well with CPAP during the study with significant improvement of the respiratory events. Therefore, I would like start the patient on CPAP therapy at home by prescribing a machine for home use. I placed the order in the chart.  Please advise patient that we need a follow up appointment with either myself or one of our nurse practitioners in about 10 weeks post set-up to check for how the patient is feeling and how well the patient is using the machine, etc. Please go ahead and schedule the appointment, while you have the patient on the phone and make sure patient understands the importance of keeping this window for the FU appointment, as it is often an insurance requirement. Failing to adhere to this may result in losing coverage for sleep apnea treatment, at which point most patients are left with a choice of returning the machine or paying out of pocket (and we want neither of this to happen!).  Please re-enforce the importance of compliance with treatment and the need for us to monitor compliance data - again an insurance requirement and usually a good feedback for the patient as far as how they are doing.  Also remind patient, that any PAP machine or mask issues should be first addressed with the DME company, who provided the machine/mask.  Please ask if patient has a preference regarding DME company, may depend on the insurance too.  Please arrange for CPAP set up at home through a DME company of patient's choice.  Once you have spoken to the patient you can close the phone encounter. Please fax/route report to referring provider, thanks,   Huston FoleySaima Athar, MD, PhD [facility]

## 2017-03-21 ENCOUNTER — Encounter: Payer: Self-pay | Admitting: Skilled Nursing Facility1

## 2017-03-21 ENCOUNTER — Encounter: Payer: BLUE CROSS/BLUE SHIELD | Attending: General Surgery | Admitting: Skilled Nursing Facility1

## 2017-03-21 DIAGNOSIS — Z6838 Body mass index (BMI) 38.0-38.9, adult: Secondary | ICD-10-CM | POA: Insufficient documentation

## 2017-03-21 DIAGNOSIS — Z713 Dietary counseling and surveillance: Secondary | ICD-10-CM | POA: Insufficient documentation

## 2017-03-21 DIAGNOSIS — E119 Type 2 diabetes mellitus without complications: Secondary | ICD-10-CM

## 2017-03-21 NOTE — Patient Instructions (Addendum)
-  It is okay to have carbohydrates   -Keep working on not drinking with meals  -Do not have drink with you while you are eating  -Be sure to eat throughout the day  -Try to eat every 3 hours
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
## 2017-03-25 NOTE — Telephone Encounter (Signed)
I called pt again to discuss. No answer, no VM set up, will try again later. 

## 2017-03-26 NOTE — Telephone Encounter (Signed)
Kelly Barber returned my call. I advised Kelly Barber that Dr. Frances FurbishAthar reviewed their sleep study results and found that Kelly Barber Kelly Barber has severe osa but did well with the cpap during the study with significant improvement of respiratory events. Dr. Frances FurbishAthar recommends that Kelly Barber start a cpap at home. I reviewed PAP compliance expectations with the Kelly Barber. Kelly Barber is agreeable to starting a CPAP. I advised Kelly Barber that an order will be sent to a DME, [facility], and [facility] will call the Kelly Barber within about one week after they file with the Kelly Barber's insurance. [facility] will show the Kelly Barber how to use the machine, fit for masks, and troubleshoot the CPAP if needed. A follow up appt was made for insurance purposes with Dr. Frances FurbishAthar on 06/25/17 at 3:00pm. Kelly Barber verbalized understanding to arrive 15 minutes early and bring their CPAP. A letter with all of this information in it will be mailed to the Kelly Barber as a reminder. I verified with the Kelly Barber that the address we have on file is correct. Kelly Barber verbalized understanding of results. Kelly Barber had no questions at this time but was encouraged to call back if questions arise.

## 2017-03-26 NOTE — Telephone Encounter (Signed)
I called pt again to discuss. No answer, VM is not set up. This is my 3rd attempt at reaching pt by phone. Will send pt a letter asking her to call me back.

## 2017-04-10 NOTE — Telephone Encounter (Signed)
Pt called she is not able to get in touch with [facility]. Pt said she and Heather/[facility] had been missing each other's return calls. She said RN told her if she had not heard from [facility] by 1/29 to call her. I gave her [employer]'s phone number, she will try again. Please call to advise

## 2017-04-10 NOTE — Telephone Encounter (Signed)
I have also reached out to [facility] and asked them to call pt.

## 2017-04-10 NOTE — Telephone Encounter (Signed)
[facility] informed me that they were able to get pt scheduled for her cpap start.

## 2017-04-25 ENCOUNTER — Encounter: Payer: Self-pay | Admitting: Skilled Nursing Facility1

## 2017-04-25 ENCOUNTER — Encounter: Payer: BLUE CROSS/BLUE SHIELD | Attending: General Surgery | Admitting: Skilled Nursing Facility1

## 2017-04-25 DIAGNOSIS — E119 Type 2 diabetes mellitus without complications: Secondary | ICD-10-CM

## 2017-04-25 DIAGNOSIS — Z6838 Body mass index (BMI) 38.0-38.9, adult: Secondary | ICD-10-CM | POA: Diagnosis not present

## 2017-04-25 DIAGNOSIS — Z713 Dietary counseling and surveillance: Secondary | ICD-10-CM | POA: Diagnosis not present
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
## 2017-04-25 NOTE — Patient Instructions (Addendum)
-  Put a protein shake in your smoothie

## 2017-05-16 ENCOUNTER — Encounter: Payer: Self-pay | Admitting: Neurology

## 2017-05-22 ENCOUNTER — Ambulatory Visit: Payer: BLUE CROSS/BLUE SHIELD | Admitting: Registered"

## 2017-05-28 ENCOUNTER — Encounter: Payer: BLUE CROSS/BLUE SHIELD | Attending: General Surgery | Admitting: Registered"

## 2017-05-28 ENCOUNTER — Encounter: Payer: Self-pay | Admitting: Registered"

## 2017-05-28 DIAGNOSIS — Z713 Dietary counseling and surveillance: Secondary | ICD-10-CM | POA: Diagnosis not present

## 2017-05-28 DIAGNOSIS — Z6838 Body mass index (BMI) 38.0-38.9, adult: Secondary | ICD-10-CM | POA: Insufficient documentation

## 2017-05-28 DIAGNOSIS — E119 Type 2 diabetes mellitus without complications: Secondary | ICD-10-CM

## 2017-05-28 NOTE — Patient Instructions (Addendum)
-   Adding protein to smoothies.   - Aim to increase physical activity to 20-30 min, 3x/week (include walking the dog).   - Aim to eat well-balanced meals.   - keep working on eating throughout the day  - keep working on your relationship with food
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
## 2017-05-29 ENCOUNTER — Other Ambulatory Visit: Payer: Self-pay

## 2017-05-29 DIAGNOSIS — I739 Peripheral vascular disease, unspecified: Secondary | ICD-10-CM

## 2017-06-23 ENCOUNTER — Encounter: Payer: Self-pay | Admitting: Neurology

## 2017-06-25 ENCOUNTER — Telehealth: Payer: Self-pay | Admitting: Neurology

## 2017-06-25 ENCOUNTER — Encounter: Payer: Self-pay | Admitting: Neurology

## 2017-06-25 ENCOUNTER — Ambulatory Visit: Payer: BLUE CROSS/BLUE SHIELD | Admitting: Neurology

## 2017-06-25 VITALS — BP 170/102 | HR 83 | Ht 66.0 in | Wt 231.0 lb

## 2017-06-25 DIAGNOSIS — G4733 Obstructive sleep apnea (adult) (pediatric): Secondary | ICD-10-CM

## 2017-06-25 DIAGNOSIS — Z789 Other specified health status: Secondary | ICD-10-CM

## 2017-06-25 DIAGNOSIS — Z9989 Dependence on other enabling machines and devices: Secondary | ICD-10-CM | POA: Diagnosis not present

## 2017-06-25 NOTE — Patient Instructions (Addendum)
Please continue using your CPAP regularly. While your insurance requires that you use CPAP at least 4 hours each night on 70% of the nights, I recommend, that you not skip any nights and use it throughout the night if you can. Getting used to CPAP and staying with the treatment long term does take time and patience and discipline. Untreated obstructive sleep apnea when it is moderate to severe can have an adverse impact on cardiovascular health and raise her risk for heart disease, arrhythmias, hypertension, congestive heart failure, stroke and diabetes. Untreated obstructive sleep apnea causes sleep disruption, nonrestorative sleep, and sleep deprivation. This can have an impact on your day to day functioning and cause daytime sleepiness and impairment of cognitive function, memory loss, mood disturbance, and problems focussing. Using CPAP regularly can improve these symptoms. You have done a good job in Feb through March, less good in the past 30 days.  Try some over the counter allergy medication, such as Zyrtec at night for allergy symptoms.  I will ask [facility] to give you the mask with used during the sleep study, which was a medium Simplus full face mask. You are now using a medium Resmed F20 full face mask.  Please look into using a salt water nasal rinse system, such as the EchoStareti Pot and follow the directions and my instructions. It may help your nasal congestion and help you tolerate your CPAP.   We will do a follow up with one of our nurse practitioners in 6 months.

## 2017-06-25 NOTE — Progress Notes (Signed)
Subjective:    Patient ID: Kelly Barber is a 52 y.o. female.  HPI     Interim history:   Ms. Cozza is a 52 year old right-handed woman with an underlying medical history of hypertension, hyperlipidemia, hyperparathyroidism, reflux disease, poorly controlled diabetes, anxiety, depression, anemia, peripheral vascular disease with s/p fem-pop bypass x 3, s/p 5 total toe amputations, history of PTSD (by chart review) and obesity, who presents for follow-up consultation of her obstructive sleep apnea after sleep study testing. The patient is unaccompanied today. I first met her on 01/29/2017 at the request of her bariatric surgeon, at which time she reported snoring and daytime somnolence. I advised her to proceed with sleep study testing. She had a split-night sleep study on 03/13/2017. I went over her test results with her in detail today. Sleep latency was 28.5 minutes at baseline with a sleep efficiency of 74.8%, REM sleep was absent. She had moderate to loud snoring. Total AHI at baseline was 72.7 per hour, average oxygen saturation 92%, nadir was 75% with significant time below 89% saturation of nearly 2 hours. She was fitted with a full facemask. CPAP was titrated from 5 cm to 13 cm. On the final pressure her AHI was 0 per hour with supine sleep achieved but non-REM sleep. O2 nadir was 92%. She had no significant PLMS during the study. Based on her test results I prescribed CPAP therapy for home use at a pressure of 13 cm via full facemask.  Today, 06/25/2017: I reviewed her CPAP compliance data from 05/25/2017 through 06/23/2017 which is a total of 30 days, during which time she used her machine every night with percent used days greater than 4 hours of only 37%, indicating significantly suboptimal compliance with an average usage of 3 hours and 29 minutes only, residual AHI at goal at 0.7 per hour, leak high with the 95th percentile at 41.4 L/m on a pressure of 13 cm with EPR of 3. She reports  doing okay with CPAP therapy. She has upcoming bariatric surgery soon. Of note, she achieved 73% compliance for more than 4 hours during 04/17/2017 through 05/16/2017 with an average usage of 5 hours and 21 minutes at the time, fulfilling compliance criteria during that month. She has had some adjustment in her DM medication, now at least has less nocturia, hoping to schedule her wt loss surgery in May if possible. She no longer is on Wellbutrin or Trintellix, now on Cymbalta 90 mg daily, no longer on the Adderall. She is struggling with dry mouth and dry eyes, she has noted a leak with her mask. She is not actually using the mask I originally prescribed for her. She is using a ResMed F 20 full facemask. She also has had recently more issues with congestion and sneezing. She has not tried allergy medication.  The patient's allergies, current medications, family history, past medical history, past social history, past surgical history and problem list were reviewed and updated as appropriate.   Previously (copied from previous notes for reference):   01/29/2017: (She) reports snoring and excessive daytime sleepiness. I reviewed your office note from 11/28/2016, which you kindly included. She is being evaluated for bariatric surgery. Her Epworth sleepiness score is 15 out of 24, fatigue score is 62 out of 63. She lives with her son and her daughter. She quit smoking 8 years ago and does not utilize alcohol. She drinks caffeine in the form of soda, about 2 per day. Trying to reduce tea.  She sees  Dr. Toy Care for PTSD, anxiety and depression. She is on Wellbutrin 150 mg once daily long-acting, Trentellix 20 mg daily and Xanax 1 mg up to 4 times a day as needed She sleeps with the TV on, which helps for her to be distracted at night, has 2 small dogs in the bed. She has been told she has apneas and Dr. Katy Fitch, eye doctor has told her he has concerns she has OSA. 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 She has not had frequent morning headaches. She has nocturia about once or twice per average night. She does not wake up rested. She has a high stress job. She also takes care of her of her 57 year old son who lives with her and is autistic, nonverbal. She has a 69 year old daughter. Her bedtime is around 7 or 8 PM, wakeup time around 6:30 or 7.   Her Past Medical History Is Significant For: Past Medical History:  Diagnosis Date  . Anemia   . Anxiety    takes xanax daily  . Anxiety disorder   . Depression, major    takes wellbutrin and celexa daily  . Diabetes mellitus   . Fibromyalgia   . GERD (gastroesophageal reflux disease)    doesn't take any meds  . H/O hiatal hernia   . Hyperlipidemia   . Hypertension    takes Lisinopril and Diovan daily  . Hyperthyroidism    yrs ago but doesn't require meds for this  . Immune deficiency disorder ([place])    pt states she has no clue what this is  . Insomnia   . Joint pain   . Joint swelling   . PONV (postoperative nausea and vomiting)   . PTSD (post-traumatic stress disorder)   . PTSD (post-traumatic stress disorder)   . PVD (peripheral vascular disease) ([place])   . Sleep apnea    no test done. dr says needs one    Her Past Surgical History Is Significant For: Past Surgical History:  Procedure Laterality Date  . ABDOMINAL AORTAGRAM N/A 08/10/2011   Procedure: ABDOMINAL [name];  Surgeon: Elam Dutch, MD;  Location: [facility] CATH LAB;  Service: Cardiovascular;  Laterality: N/A;  . AMPUTATION     lft foot toes 1.2.3  . AMPUTATION  01/07/2012   Procedure: AMPUTATION RAY;  Surgeon: Elam Dutch, MD;  Location[facility];  Service: Vascular;  Laterality: Right;  4TH & 5TH toes  . aortogram    . CHOLECYSTECTOMY  1990  . COLONOSCOPY    . DILATION AND CURETTAGE OF UTERUS    . FEMORAL BYPASS  04/29/2008   left  . FEMORAL-POPLITEAL BYPASS GRAFT  08/27/2011   Procedure: BYPASS GRAFT  FEMORAL-POPLITEAL ARTERY;  Surgeon: Elam Dutch, MD;  Location: [facility];  Service: Vascular;  Laterality: Right;  . FEMORAL-POPLITEAL BYPASS GRAFT  08/29/2011   Procedure: BYPASS GRAFT FEMORAL-POPLITEAL ARTERY;  Surgeon: Elam Dutch, MD;  Location: [facility] OR;  Service: Vascular;  Laterality: Right;  Vein Patch Angionplasty and Thrombectomy   . INTRAOPERATIVE ARTERIOGRAM  08/29/2011   Procedure: INTRA OPERATIVE ARTERIOGRAM;  Surgeon: Elam Dutch, MD;  Location: [facility];  Service: Vascular;  Laterality: Right;  . LOWER EXTREMITY ANGIOGRAM N/A 08/10/2011   Procedure: LOWER EXTREMITY ANGIOGRAM;  Surgeon: Elam Dutch, MD;  Location: [facility] CATH LAB;  Service: Cardiovascular;  Laterality: N/A;  . TOE AMPUTATION  07/12/2008    left foot toes 1,2,3  . TUBAL LIGATION    .  VASCULAR SURGERY      Her Family History Is Significant For: Family History  Problem Relation Age of Onset  . Diabetes Mother   . Hyperlipidemia Mother   . Hypertension Mother   . Diabetes Father   . Heart disease Father        before age 65  . Hypertension Father   . Hyperlipidemia Father   . Hypertension Sister   . Diabetes Brother     Her Social History Is Significant For: Social History   Socioeconomic History  . Marital status: Divorced    Spouse name: Not on file  . Number of children: Not on file  . Years of education: Not on file  . Highest education level: Not on file  Occupational History  . Not on file  Social Needs  . Financial resource strain: Not on file  . Food insecurity:    Worry: Not on file    Inability: Not on file  . Transportation needs:    Medical: Not on file    Non-medical: Not on file  Tobacco Use  . Smoking status: Former Smoker    Packs/day: 1.00    Years: 10.00    Pack years: 10.00    Types: Cigarettes    Last attempt to quit: 07/25/2008    Years since quitting: 8.9  . Smokeless tobacco: Never Used  . Tobacco comment: quit 36yr ago  Substance and Sexual Activity  .  Alcohol use: No  . Drug use: No  . Sexual activity: Not Currently  Lifestyle  . Physical activity:    Days per week: Not on file    Minutes per session: Not on file  . Stress: Not on file  Relationships  . Social connections:    Talks on phone: Not on file    Gets together: Not on file    Attends religious service: Not on file    Active member of club or organization: Not on file    Attends meetings of clubs or organizations: Not on file    Relationship status: Not on file  Other Topics Concern  . Not on file  Social History Narrative  . Not on file    Her Allergies Are:  Allergies  Allergen Reactions  . Amoxicillin-Pot Clavulanate Shortness Of Breath  . Codeine Anaphylaxis  . Darvocet [Propoxyphene N-Acetaminophen] Anaphylaxis    Plain Tylenol ok  . Percocet [Oxycodone-Acetaminophen] Anaphylaxis    Plain Tylenol ok  . Statins Other (See Comments)    Joint pain  :   Her Current Medications Are:  Outpatient Encounter Medications as of 06/25/2017  Medication Sig  . acetaminophen (TYLENOL) 325 MG tablet Take 325 mg by mouth every 6 (six) hours as needed for mild pain or moderate pain.  .Marland Kitchenamphetamine-dextroamphetamine (ADDERALL) 20 MG tablet   . ezetimibe (ZETIA) 10 MG tablet Take 1 tablet (10 mg total) by mouth daily.  . insulin aspart (NOVOLOG) 100 UNIT/ML injection Inject 10 Units into the skin once.  . Insulin Glargine (LANTUS SOLOSTAR) 100 UNIT/ML Solostar Pen Inject 10 Units into the skin daily at 10 pm.  . losartan (COZAAR) 50 MG tablet Take 1 tablet (50 mg total) by mouth daily.  . Semaglutide (OZEMPIC) 0.25 or 0.5 MG/DOSE SOPN Inject into the skin.  . [DISCONTINUED] insulin aspart (NOVOLOG) 100 UNIT/ML injection Inject 15 Units into the vein 3 (three) times daily with meals.  . [DISCONTINUED] nitrofurantoin, macrocrystal-monohydrate, (MACROBID) 100 MG capsule Take 1 capsule (100 mg total) by mouth 2 (  two) times daily.   No facility-administered encounter  medications on file as of 06/25/2017.   :  Review of Systems:  Out of a complete 14 point review of systems, all are reviewed and negative with the exception of these symptoms as listed below: Review of Systems  Neurological:       Pt reports that she is doing ok with her cpap. Pt has upcoming bariatric surgery.    Objective:  Neurological Exam  Physical Exam Physical Examination:   Vitals:   06/25/17 1504  BP: (!) 170/102  Pulse: 83    General Examination: The patient is a very pleasant 52 y.o. female in no acute distress. She appears well-developed and well-nourished and well groomed.   HEENT: Normocephalic, atraumatic, pupils are equal, round and reactive to light and accommodation. Extraocular tracking is good without limitation to gaze excursion or nystagmus noted. Normal smooth pursuit is noted. Hearing is grossly intact. Face is symmetric with normal facial animation and normal facial sensation. Speech is clear with no dysarthria noted. There is no hypophonia. There is no lip, neck/head, jaw or voice tremor. Neck is supple with full range of passive and active motion. There are no carotid bruits on auscultation. Oropharynx exam reveals: moderate mouth dryness, adequate dental hygiene and moderate airway crowding. Tongue protrudes centrally and palate elevates symmetrically.   Chest: Clear to auscultation without wheezing, rhonchi or crackles noted.  Heart: S1+S2+0, regular and normal without murmurs, rubs or gallops noted.   Abdomen: Soft, non-tender and non-distended with normal bowel sounds appreciated on auscultation.  Extremities: There is trace pitting edema in the distal lower extremities bilaterally.   Skin: Warm and dry without trophic changes noted.  Musculoskeletal: exam reveals no obvious joint deformities, tenderness or joint swelling or erythema, she is status post toe amputations bilaterally.   Neurologically:  Mental status: The patient is awake,  alert and oriented in all 4 spheres. Her immediate and remote memory, attention, language skills and fund of knowledge are appropriate. There is no evidence of aphasia, agnosia, apraxia or anomia. Speech is clear with normal prosody and enunciation. Thought process is linear. Mood is normal and affect is normal.  Cranial nerves II - XII are as described above under HEENT exam.  HimMotor exam: Normal bulk, strength and tone is noted. There is no drift, tremor or rebound. Fine motor skills and coordination: grossly intact.  Cerebellar testing: No dysmetria or intention tremor. There is no truncal or gait ataxia.  Sensory exam: intact to light touch in the upper and lower extremities.  Gait, station and balance: She stands easily. No veering to one side is noted. No leaning to one side is noted. Posture is age-appropriate and stance is narrow based. Gait shows normal stride length and normal pace.  Assessment and Plan:  In summary, KRISSY OREBAUGH is a very pleasant 52 year old female with an underlying complex medical history of hypertension, hyperlipidemia, high parathyroidism, reflux disease, poorly controlled diabetes, anxiety, depression, anemia, peripheral vascular disease with s/p fem-pop bypass x 3, s/p 5 total toe amputations, history of PTSD and obesity, who presents for follow-up consultation of her severe obstructive sleep apnea, after a split-night sleep study in January 2019 which showed a baseline AHI of 72.7 per hour. She has established treatment with CPAP therapy at a pressure of 13 cm with fluctuating compliance and recent issues with nasal congestion. Also, her mask has been leaking too much consistently. She did fulfill compliance criteria in the month of mid February  through mid March 2019. Since then, more recently her compliance has declinedbut she has had more issues with diabetes control and nocturia recently as well as increase in allergy symptoms. She is advised to try Zyrtec at  night for allergy symptoms. In addition, she may benefit from using a nasal rinse system such as neti pot. Furthermore, she will benefit from a mask refit which I requested through her DME company. She is commended for her treatment adherence and trying to be compliant despite recent issues. She is hoping to get her bariatric surgery scheduled for May. I suggested a 6 month follow-up, sooner if needed.She can see one of our nurse practitioners next time. I answered all her questions today and she was in agreement. I spent 25 minutes in total face-to-face time with the patient, more than 50% of which was spent in counseling and coordination of care, reviewing test results, reviewing medication and discussing or reviewing the diagnosis of OSA, its prognosis and treatment options. Pertinent laboratory and imaging test results that were available during this visit with the patient were reviewed by me and considered in my medical decision making (see chart for details).

## 2017-06-26 NOTE — Telephone Encounter (Signed)
error 

## 2017-06-27 ENCOUNTER — Encounter: Payer: BLUE CROSS/BLUE SHIELD | Attending: General Surgery | Admitting: Registered"

## 2017-06-27 ENCOUNTER — Encounter: Payer: Self-pay | Admitting: Registered"

## 2017-06-27 DIAGNOSIS — Z6838 Body mass index (BMI) 38.0-38.9, adult: Secondary | ICD-10-CM | POA: Insufficient documentation

## 2017-06-27 DIAGNOSIS — E119 Type 2 diabetes mellitus without complications: Secondary | ICD-10-CM

## 2017-06-27 DIAGNOSIS — Z713 Dietary counseling and surveillance: Secondary | ICD-10-CM | POA: Diagnosis not present

## 2017-06-27 NOTE — Patient Instructions (Addendum)
-   Aim to have fruit with protein option such as apple with peanut butter, plum with nuts, etc.   - Try 2-3 protein powder/shake options that you enjoy with at least 15 grams or more of protein and 5 grams or less of carbohydrates.
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
## 2017-07-10 ENCOUNTER — Encounter: Payer: Self-pay | Admitting: Podiatry

## 2017-07-10 ENCOUNTER — Ambulatory Visit: Payer: BLUE CROSS/BLUE SHIELD | Admitting: Podiatry

## 2017-07-10 ENCOUNTER — Ambulatory Visit (INDEPENDENT_AMBULATORY_CARE_PROVIDER_SITE_OTHER): Payer: BLUE CROSS/BLUE SHIELD

## 2017-07-10 DIAGNOSIS — E0843 Diabetes mellitus due to underlying condition with diabetic autonomic (poly)neuropathy: Secondary | ICD-10-CM | POA: Diagnosis not present

## 2017-07-10 DIAGNOSIS — M779 Enthesopathy, unspecified: Secondary | ICD-10-CM

## 2017-07-10 DIAGNOSIS — B351 Tinea unguium: Secondary | ICD-10-CM

## 2017-07-10 DIAGNOSIS — M79676 Pain in unspecified toe(s): Secondary | ICD-10-CM | POA: Diagnosis not present

## 2017-07-10 DIAGNOSIS — I739 Peripheral vascular disease, unspecified: Secondary | ICD-10-CM

## 2017-07-15 NOTE — Progress Notes (Signed)
   SUBJECTIVE Patient with a history of type 2 diabetes mellitus presents to office today with a chief complaint of lateral left ankle pain that began about five months ago. She also reports a painful callus lesion noted to the plantar left forefoot. She has been taking Cymbalta for treatment with some relief. She is also complaining of elongated, thickened nails that cause pain while ambulating in shoes. She is unable to trim her own nails. Patient is here for further evaluation and treatment.   Past Medical History:  Diagnosis Date  . Anemia   . Anxiety    takes xanax daily  . Anxiety disorder   . Depression, major    takes wellbutrin and celexa daily  . Diabetes mellitus   . Fibromyalgia   . GERD (gastroesophageal reflux disease)    doesn't take any meds  . H/O hiatal hernia   . Hyperlipidemia   . Hypertension    takes Lisinopril and Diovan daily  . Hyperthyroidism    yrs ago but doesn't require meds for this  . Immune deficiency disorder ([facility])    pt states she has no clue what this is  . Insomnia   . Joint pain   . Joint swelling   . PONV (postoperative nausea and vomiting)   . PTSD (post-traumatic stress disorder)   . PTSD (post-traumatic stress disorder)   . PVD (peripheral vascular disease) ([facility])   . Sleep apnea    no test done. dr says needs one    OBJECTIVE General Patient is awake, alert, and oriented x 3 and in no acute distress. Derm Skin is dry and supple bilateral. Negative open lesions or macerations. Remaining integument unremarkable. Nails are tender, long, thickened and dystrophic with subungual debris, consistent with onychomycosis, digits 4-5 left and 1-3 right. No signs of infection noted. Vasc  DP and PT pedal pulses palpable bilaterally. Temperature gradient within normal limits.  Neuro Epicritic and protective threshold sensation diminished bilaterally.  Musculoskeletal Exam No symptomatic pedal deformities noted bilateral. Muscular strength within  normal limits.  Radiographic Exam:  Normal osseous mineralization. Joint spaces preserved. No fracture/dislocation/boney destruction.     ASSESSMENT 1. Diabetes Mellitus w/ peripheral neuropathy 2. Onychomycosis of nail due to dermatophyte bilateral 3. Pain in foot bilateral 4. H/o toe amputation 1-3 left 5. H/o toe amputation 4-5 right  6. PVD BLE  PLAN OF CARE 1. Patient evaluated today. X-Rays reviewed.  2. Instructed to maintain good pedal hygiene and foot care. Stressed importance of controlling blood sugar.  3. Mechanical debridement of nails 4-5 left, 1-3 right performed using a nail nipper. Filed with dremel without incident.  4. Appointment with [facility] for DM shoes.  5. Return to clinic in 3 mos.     Kelly Barber, DPM [facility]  Dr. Felecia Barber, DPM    [address]                                        [place], [place] [address]                Office [phone]  Fax [phone]

## 2017-07-18 ENCOUNTER — Ambulatory Visit (HOSPITAL_COMMUNITY)
Admission: RE | Admit: 2017-07-18 | Discharge: 2017-07-18 | Disposition: A | Discharge status: Home / Self Care | Payer: BLUE CROSS/BLUE SHIELD | Source: Ambulatory Visit | Attending: Vascular Surgery | Admitting: Vascular Surgery

## 2017-07-18 ENCOUNTER — Encounter: Payer: Self-pay | Admitting: Vascular Surgery

## 2017-07-18 ENCOUNTER — Other Ambulatory Visit: Payer: Self-pay

## 2017-07-18 ENCOUNTER — Ambulatory Visit (INDEPENDENT_AMBULATORY_CARE_PROVIDER_SITE_OTHER)
Admission: RE | Admit: 2017-07-18 | Discharge: 2017-07-18 | Disposition: A | Discharge status: Home / Self Care | Payer: BLUE CROSS/BLUE SHIELD | Source: Ambulatory Visit | Attending: Vascular Surgery | Admitting: Vascular Surgery

## 2017-07-18 ENCOUNTER — Ambulatory Visit: Payer: BLUE CROSS/BLUE SHIELD | Admitting: Vascular Surgery

## 2017-07-18 ENCOUNTER — Encounter: Payer: BLUE CROSS/BLUE SHIELD | Attending: General Surgery | Admitting: Registered"

## 2017-07-18 ENCOUNTER — Encounter: Payer: Self-pay | Admitting: Registered"

## 2017-07-18 VITALS — BP 139/96 | HR 90 | Temp 98.5°F | Resp 20 | Ht 66.7 in | Wt 224.0 lb

## 2017-07-18 DIAGNOSIS — Z6838 Body mass index (BMI) 38.0-38.9, adult: Secondary | ICD-10-CM | POA: Diagnosis not present

## 2017-07-18 DIAGNOSIS — E119 Type 2 diabetes mellitus without complications: Secondary | ICD-10-CM

## 2017-07-18 DIAGNOSIS — I739 Peripheral vascular disease, unspecified: Secondary | ICD-10-CM

## 2017-07-18 DIAGNOSIS — Z95828 Presence of other vascular implants and grafts: Secondary | ICD-10-CM | POA: Insufficient documentation

## 2017-07-18 DIAGNOSIS — Z713 Dietary counseling and surveillance: Secondary | ICD-10-CM | POA: Insufficient documentation

## 2017-07-18 DIAGNOSIS — E785 Hyperlipidemia, unspecified: Secondary | ICD-10-CM | POA: Insufficient documentation

## 2017-07-18 DIAGNOSIS — Z87891 Personal history of nicotine dependence: Secondary | ICD-10-CM | POA: Diagnosis not present

## 2017-07-18 DIAGNOSIS — I10 Essential (primary) hypertension: Secondary | ICD-10-CM | POA: Diagnosis not present

## 2017-07-18 DIAGNOSIS — E1151 Type 2 diabetes mellitus with diabetic peripheral angiopathy without gangrene: Secondary | ICD-10-CM | POA: Insufficient documentation

## 2017-07-18 NOTE — Patient Instructions (Addendum)
-   Aim to add at least 1 bottle of water to current fluid intake.   - Try 2-3 protein powder/shake options that you enjoy with at least 15 grams or more of protein and 5 grams or less of carbohydrates.
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
## 2017-07-18 NOTE — Progress Notes (Signed)
Patient is a 52 year old female who previously underwent right femoral to above-knee popliteal bypass June 2013.  This was done with a vein.  The indication was for rest pain.  She subsequently underwent amputation of toes 4 and 5 on the right foot.  She had previously undergone a left femoral endarterectomy and left femoral-above-knee popliteal bypass in 2010.  She reports no claudication symptoms.  She has no rest pain.  She has no nonhealing wounds.  Primary reason for her office visit today was to do a checkup prior to undergoing gastric bypass surgery.  Past Medical History:  Diagnosis Date  . Anemia   . Anxiety    takes xanax daily  . Anxiety disorder   . Depression, major    takes wellbutrin and celexa daily  . Diabetes mellitus   . Fibromyalgia   . GERD (gastroesophageal reflux disease)    doesn't take any meds  . H/O hiatal hernia   . Hyperlipidemia   . Hypertension    takes Lisinopril and Diovan daily  . Hyperthyroidism    yrs ago but doesn't require meds for this  . Immune deficiency disorder (HCC)    pt states she has no clue what this is  . Insomnia   . Joint pain   . Joint swelling   . PONV (postoperative nausea and vomiting)   . PTSD (post-traumatic stress disorder)   . PTSD (post-traumatic stress disorder)   . PVD (peripheral vascular disease) (HCC)   . Sleep apnea    no test done. dr says needs one    Past Surgical History:  Procedure Laterality Date  . ABDOMINAL AORTAGRAM N/A 08/10/2011   Procedure: ABDOMINAL Ronny Flurry;  Surgeon: Sherren Kerns, MD;  Location: [facility] CATH LAB;  Service: Cardiovascular;  Laterality: N/A;  . AMPUTATION     lft foot toes 1.2.3  . AMPUTATION  01/07/2012   Procedure: AMPUTATION RAY;  Surgeon: Sherren Kerns, MD;  Location: [facility] OR;  Service: Vascular;  Laterality: Right;  4TH & 5TH toes  . aortogram    . CHOLECYSTECTOMY  1990  . COLONOSCOPY    . DILATION AND CURETTAGE OF UTERUS    . FEMORAL BYPASS  04/29/2008   left  .  FEMORAL-POPLITEAL BYPASS GRAFT  08/27/2011   Procedure: BYPASS GRAFT FEMORAL-POPLITEAL ARTERY;  Surgeon: Sherren Kerns, MD;  Location: [facility] OR;  Service: Vascular;  Laterality: Right;  . FEMORAL-POPLITEAL BYPASS GRAFT  08/29/2011   Procedure: BYPASS GRAFT FEMORAL-POPLITEAL ARTERY;  Surgeon: Sherren Kerns, MD;  Location: [facility] OR;  Service: Vascular;  Laterality: Right;  Vein Patch Angionplasty and Thrombectomy   . INTRAOPERATIVE ARTERIOGRAM  08/29/2011   Procedure: INTRA OPERATIVE ARTERIOGRAM;  Surgeon: Sherren Kerns, MD;  Location: [facility] OR;  Service: Vascular;  Laterality: Right;  . LOWER EXTREMITY ANGIOGRAM N/A 08/10/2011   Procedure: LOWER EXTREMITY ANGIOGRAM;  Surgeon: Sherren Kerns, MD;  Location: [facility] CATH LAB;  Service: Cardiovascular;  Laterality: N/A;  . TOE AMPUTATION  07/12/2008    left foot toes 1,2,3  . TUBAL LIGATION    . VASCULAR SURGERY      Review of systems: She denies shortness of breath.  She denies chest pain.  Current Outpatient Medications on File Prior to Visit  Medication Sig Dispense Refill  . acetaminophen (TYLENOL) 325 MG tablet Take 325 mg by mouth every 6 (six) hours as needed for mild pain or moderate pain.    Marland Kitchen ALPRAZolam (XANAX) 1 MG tablet TAKE 1 TABLET BY MOUTH 4  TIMES DAILY AS NEEDED  3  . DULoxetine (CYMBALTA) 30 MG capsule Take 90 mg by mouth daily.  3  . ezetimibe (ZETIA) 10 MG tablet Take 1 tablet (10 mg total) by mouth daily. 30 tablet 3  . insulin aspart (NOVOLOG) 100 UNIT/ML injection Inject 10 Units into the skin once. (Patient taking differently: Inject 18 Units into the skin 2 (two) times daily. ) 10 mL 0  . losartan (COZAAR) 50 MG tablet Take 1 tablet (50 mg total) by mouth daily. 30 tablet 2  . losartan-hydrochlorothiazide (HYZAAR) 100-12.5 MG tablet Take 1 tablet by mouth daily.  2  . NOVOLIN N 100 UNIT/ML injection   1  . pioglitazone (ACTOS) 15 MG tablet Take 15 mg by mouth daily.  3  . Semaglutide (OZEMPIC) 0.25 or 0.5 MG/DOSE SOPN Inject  into the skin.     No current facility-administered medications on file prior to visit.     Allergies  Allergen Reactions  . Amoxicillin-Pot Clavulanate Shortness Of Breath  . Codeine Anaphylaxis  . Darvocet [Propoxyphene N-Acetaminophen] Anaphylaxis    Plain Tylenol ok  . Percocet [Oxycodone-Acetaminophen] Anaphylaxis    Plain Tylenol ok  . Statins Other (See Comments)    Joint pain    Physical exam:  Vitals:   07/18/17 1540  BP: (!) 139/96  Pulse: 90  Resp: 20  Temp: 98.5 F (36.9 C)  TempSrc: Oral  SpO2: 98%  Weight: 224 lb (101.6 kg)  Height: 5' 6.7" (1.694 m)    Neck: No carotid bruits  Chest: Clear to auscultation bilaterally  Cardiac: Regular rate and rhythm  Abdomen: Soft nontender no mass  Extremities: 2+ femoral pulses absent pedal pulses bilaterally  Data: Patient had a duplex exam of both bypass graft today.  This showed biphasic to triphasic flow with no evidence of proximal or distal stenosis.  She also underwent bilateral ABIs which were 1.24 on the right 1.18 on the left essentially the same as 3 years ago.  Assessment: Patent bilateral femoral above-knee popliteal bypass graft was no significant recurrent stenosis.  Currently asymptomatic.  Plan: The patient will return for graft surveillance scan and ABIs in 1 year.  She will return sooner if she develops recurrent symptoms of nonhealing wounds or claudication or rest pain.  [name], MD [facility] Office: [phone] Pager: [phone]

## 2017-07-23 ENCOUNTER — Other Ambulatory Visit: Payer: BLUE CROSS/BLUE SHIELD | Admitting: Orthotics

## 2017-07-24 ENCOUNTER — Encounter: Payer: Self-pay | Admitting: Nurse Practitioner

## 2017-08-05 ENCOUNTER — Encounter: Payer: BLUE CROSS/BLUE SHIELD | Attending: General Surgery | Admitting: Registered"

## 2017-08-05 DIAGNOSIS — Z6838 Body mass index (BMI) 38.0-38.9, adult: Secondary | ICD-10-CM | POA: Diagnosis not present

## 2017-08-05 DIAGNOSIS — E119 Type 2 diabetes mellitus without complications: Secondary | ICD-10-CM

## 2017-08-05 DIAGNOSIS — Z713 Dietary counseling and surveillance: Secondary | ICD-10-CM | POA: Insufficient documentation

## 2017-08-07 NOTE — Progress Notes (Signed)
  Pre-Operative Nutrition Class:  Appt start time: 8:15   End time:  9:15.  Patient was seen on 08/05/2017 for Pre-Operative Bariatric Surgery Education at [facility].   Surgery date: TBD Surgery type: RYGB Start weight at [facility]: 241.3 Weight today: 229.6   Samples given per MNT protocol. Patient educated on appropriate usage: Bariatric Advantage Multivitamin Lot # [id number] Exp: 12/2018  Bariatric Advantage Calcium Citrate Lot # [id number] Exp: 12/09/2017  [name] Protein Shake Lot # [id number] Exp: 12/20/2017   The following the learning objectives were met by the patient during this course:  Identify Pre-Op Dietary Goals and will begin 2 weeks pre-operatively  Identify appropriate sources of fluids and proteins   State protein recommendations and appropriate sources pre and post-operatively  Identify Post-Operative Dietary Goals and will follow for 2 weeks post-operatively  Identify appropriate multivitamin and calcium sources  Describe the need for physical activity post-operatively and will follow MD recommendations  State when to call healthcare provider regarding medication questions or post-operative complications  Handouts given during class include:  Pre-Op Bariatric Surgery Diet Handout  Protein Shake Handout  Post-Op Bariatric Surgery Nutrition Handout  BELT Program Information Flyer  Support Group Information Flyer  [facility] Bariatric Supplements Price List  Follow-Up Plan: Patient will follow-up at [facility] 2 weeks post operatively for diet advancement per MD.

## 2017-08-15 ENCOUNTER — Ambulatory Visit: Payer: Self-pay | Admitting: General Surgery

## 2017-08-27 NOTE — Progress Notes (Signed)
LOV [name], FNP 06-28-17 on chart   Last labs 06-07-17 on chart , [facility]   EKG , CXR 01-17-17 epic

## 2017-08-27 NOTE — Patient Instructions (Addendum)
Kelly Barber  08/27/2017   Your procedure is scheduled on: 09-02-17  Report to [facility] Main  Entrance    Report to admitting at 5:30AM    Call this number if you have problems the morning of surgery [phone]    PLEASE BRING CPAP MASK AND TUBING ONLY. DEVICE WILL BE PROVIDED!   Remember: NO SOLID FOOD AFTER 600 PM THE NIGHT BEFORE YOUR SURGERY. YOU MAY DRINK CLEAR FLUIDS. DRINK 2 PRE-SURGERY DRINKS THE NIGHT BEFORE SURGERY. MORNING OF SURGERY DRINK:  1SHAKE BEFORE YOU LEAVE HOME. DRINK ALL OF THE SHAKE AT ONE TIME.   THE SHAKE YOU DRINK BEFORE YOU LEAVE HOME WILL BE THE LAST FLUIDS YOU DRINK BEFORE SURGERY.   CLEAR LIQUID DIET   Foods Allowed                                                                     Foods Excluded  Coffee and tea, regular and decaf                             liquids that you cannot  Plain Jell-O in any flavor                                             see through such as: Fruit ices (not with fruit pulp)                                     milk, soups, orange juice  Iced Popsicles                                    All solid food Carbonated beverages, regular and diet                                    Cranberry, grape and apple juices Sports drinks like Gatorade Lightly seasoned clear broth or consume(fat free) Sugar, honey syrup  Sample Menu Breakfast                                Lunch                                     Supper Cranberry juice                    Beef broth                            Chicken broth Jell-O  Grape juice                           Apple juice Coffee or tea                        Jell-O                                      Popsicle                                                Coffee or tea                        Coffee or tea       Take these medicines the morning of surgery with A SIP OF WATER:  XANAX IF NEEDED, EYE DROPS                                 You may not have any metal on your body including hair pins and              piercings  Do not wear jewelry, make-up, lotions, powders or perfumes, deodorant             Do not wear nail polish.  Do not shave  48 hours prior to surgery.          Do not bring valuables to the hospital. [facility] IS NOT             RESPONSIBLE   FOR VALUABLES.  Contacts, dentures or bridgework may not be worn into surgery.  Leave suitcase in the car. After surgery it may be brought to your room.                  Please read over the following fact sheets you were given: _____________________________________________________________________    How to Manage Your Diabetes Before and After Surgery  Why is it important to control my blood sugar before and after surgery? . Improving blood sugar levels before and after surgery helps healing and can limit problems. . A way of improving blood sugar control is eating a healthy diet by: o  Eating less sugar and carbohydrates o  Increasing activity/exercise o  Talking with your doctor about reaching your blood sugar goals . High blood sugars (greater than 180 mg/dL) can raise your risk of infections and slow your recovery, so you will need to focus on controlling your diabetes during the weeks before surgery. . Make sure that the doctor who takes care of your diabetes knows about your planned surgery including the date and location.  How do I manage my blood sugar before surgery? . Check your blood sugar at least 4 times a day, starting 2 days before surgery, to make sure that the level is not too high or low. o Check your blood sugar the morning of your surgery when you wake up and every 2 hours until you get to the Short Stay unit. . If your blood sugar is less than 70 mg/dL, you will need to treat for low blood sugar: o  Do not take insulin. o Treat a low blood sugar (less than 70 mg/dL) with  cup of clear juice (cranberry or apple), 4 glucose  tablets, OR glucose gel. o Recheck blood sugar in 15 minutes after treatment (to make sure it is greater than 70 mg/dL). If your blood sugar is not greater than 70 mg/dL on recheck, call [phone] for further instructions. . Report your blood sugar to the short stay nurse when you get to Short Stay.  . If you are admitted to the hospital after surgery: o Your blood sugar will be checked by the staff and you will probably be given insulin after surgery (instead of oral diabetes medicines) to make sure you have good blood sugar levels. o The goal for blood sugar control after surgery is 80-180 mg/dL.   WHAT DO I DO ABOUT MY DIABETES MEDICATION?    . THE DAY BEFORE SURGERY, take your normal dose of NOVOLIN (N) INSULIN in the morning. Only take 50% of your dose of NOVOLIN (N)  INSULIN or 9 units at night.     . THE MORNING OF SURGERY, Only take 50% of your dose of NOVOLIN (N)  INSULIN or 9 units in the morning.    Patient Signature:  Date:   Nurse Signature:  Date:   Reviewed and Endorsed by [facility] Patient Education Committee, August 2015     PAIN IS EXPECTED AFTER SURGERY AND WILL NOT BE COMPLETELY ELIMINATED. AMBULATION AND TYLENOL WILL HELP REDUCE INCISIONAL AND GAS PAIN. MOVEMENT IS KEY!  YOU ARE EXPECTED TO BE OUT OF BED WITHIN 4 HOURS OF ADMISSION TO YOUR PATIENT ROOM.  SITTING IN THE RECLINER THROUGHOUT THE DAY IS IMPORTANT FOR DRINKING FLUIDS AND MOVING GAS THROUGHOUT THE GI TRACT.  COMPRESSION STOCKINGS SHOULD BE WORN [facility] STAY UNLESS YOU ARE WALKING.   INCENTIVE SPIROMETER SHOULD BE USED EVERY HOUR WHILE AWAKE TO DECREASE POST-OPERATIVE COMPLICATIONS SUCH AS PNEUMONIA.  WHEN DISCHARGED HOME, IT IS IMPORTANT TO CONTINUE TO WALK EVERY HOUR AND USE THE INCENTIVE SPIROMETER EVERY HOUR.          _____________________________________________________________________  [facility] - Preparing for Surgery Before surgery, you can play an  important role.  Because skin is not sterile, your skin needs to be as free of germs as possible.  You can reduce the number of germs on your skin by washing with CHG (chlorahexidine gluconate) soap before surgery.  CHG is an antiseptic cleaner which kills germs and bonds with the skin to continue killing germs even after washing. Please DO NOT use if you have an allergy to CHG or antibacterial soaps.  If your skin becomes reddened/irritated stop using the CHG and inform your nurse when you arrive at Short Stay. Do not shave (including legs and underarms) for at least 48 hours prior to the first CHG shower.  You may shave your face/neck. Please follow these instructions carefully:  1.  Shower with CHG Soap the night before surgery and the  morning of Surgery.  2.  If you choose to wash your hair, wash your hair first as usual with your  normal  shampoo.  3.  After you shampoo, rinse your hair and body thoroughly to remove the  shampoo.                           4.  Use CHG as you would any other liquid soap.  You can apply chg directly  to  the skin and wash                       Gently with a scrungie or clean washcloth.  5.  Apply the CHG Soap to your body ONLY FROM THE NECK DOWN.   Do not use on face/ open                           Wound or open sores. Avoid contact with eyes, ears mouth and genitals (private parts).                       Wash face,  Genitals (private parts) with your normal soap.             6.  Wash thoroughly, paying special attention to the area where your surgery  will be performed.  7.  Thoroughly rinse your body with warm water from the neck down.  8.  DO NOT shower/wash with your normal soap after using and rinsing off  the CHG Soap.                9.  Pat yourself dry with a clean towel.            10.  Wear clean pajamas.            11.  Place clean sheets on your bed the night of your first shower and do not  sleep with pets. Day of Surgery : Do not apply any  lotions/deodorants the morning of surgery.  Please wear clean clothes to the hospital/surgery center.  FAILURE TO FOLLOW THESE INSTRUCTIONS MAY RESULT IN THE CANCELLATION OF YOUR SURGERY PATIENT SIGNATURE_________________________________  NURSE SIGNATURE__________________________________  ________________________________________________________________________   Rogelia MireIncentive Spirometer  An incentive spirometer is a tool that can help keep your lungs clear and active. This tool measures how well you are filling your lungs with each breath. Taking long deep breaths may help reverse or decrease the chance of developing breathing (pulmonary) problems (especially infection) following:  A long period of time when you are unable to move or be active. BEFORE THE PROCEDURE   If the spirometer includes an indicator to show your best effort, your nurse or respiratory therapist will set it to a desired goal.  If possible, sit up straight or lean slightly forward. Try not to slouch.  Hold the incentive spirometer in an upright position. INSTRUCTIONS FOR USE  1. Sit on the edge of your bed if possible, or sit up as far as you can in bed or on a chair. 2. Hold the incentive spirometer in an upright position. 3. Breathe out normally. 4. Place the mouthpiece in your mouth and seal your lips tightly around it. 5. Breathe in slowly and as deeply as possible, raising the piston or the ball toward the top of the column. 6. Hold your breath for 3-5 seconds or for as long as possible. Allow the piston or ball to fall to the bottom of the column. 7. Remove the mouthpiece from your mouth and breathe out normally. 8. Rest for a few seconds and repeat Steps 1 through 7 at least 10 times every 1-2 hours when you are awake. Take your time and take a few normal breaths between deep breaths. 9. The spirometer may include an indicator to show your best effort. Use the indicator as a goal to work toward during each  repetition. 10. After each  set of 10 deep breaths, practice coughing to be sure your lungs are clear. If you have an incision (the cut made at the time of surgery), support your incision when coughing by placing a pillow or rolled up towels firmly against it. Once you are able to get out of bed, walk around indoors and cough well. You may stop using the incentive spirometer when instructed by your caregiver.  RISKS AND COMPLICATIONS  Take your time so you do not get dizzy or light-headed.  If you are in pain, you may need to take or ask for pain medication before doing incentive spirometry. It is harder to take a deep breath if you are having pain. AFTER USE  Rest and breathe slowly and easily.  It can be helpful to keep track of a log of your progress. Your caregiver can provide you with a simple table to help with this. If you are using the spirometer at home, follow these instructions: SEEK MEDICAL CARE IF:   You are having difficultly using the spirometer.  You have trouble using the spirometer as often as instructed.  Your pain medication is not giving enough relief while using the spirometer.  You develop fever of 100.5 F (38.1 C) or higher. SEEK IMMEDIATE MEDICAL CARE IF:   You cough up bloody sputum that had not been present before.  You develop fever of 102 F (38.9 C) or greater.  You develop worsening pain at or near the incision site. MAKE SURE YOU:   Understand these instructions.  Will watch your condition.  Will get help right away if you are not doing well or get worse. Document Released: 07/02/2006 Document Revised: 05/14/2011 Document Reviewed: 09/02/2006 ExitCare Patient Information 2014 ExitCare, [place].   ________________________________________________________________________  WHAT IS A BLOOD TRANSFUSION? Blood Transfusion Information  A transfusion is the replacement of blood or some of its parts. Blood is made up of multiple cells which provide  different functions.  Red blood cells carry oxygen and are used for blood loss replacement.  White blood cells fight against infection.  Platelets control bleeding.  Plasma helps clot blood.  Other blood products are available for specialized needs, such as hemophilia or other clotting disorders. BEFORE THE TRANSFUSION  Who gives blood for transfusions?   Healthy volunteers who are fully evaluated to make sure their blood is safe. This is blood bank blood. Transfusion therapy is the safest it has ever been in the practice of medicine. Before blood is taken from a donor, a complete history is taken to make sure that person has no history of diseases nor engages in risky social behavior (examples are intravenous drug use or sexual activity with multiple partners). The donor's travel history is screened to minimize risk of transmitting infections, such as malaria. The donated blood is tested for signs of infectious diseases, such as HIV and hepatitis. The blood is then tested to be sure it is compatible with you in order to minimize the chance of a transfusion reaction. If you or a relative donates blood, this is often done in anticipation of surgery and is not appropriate for emergency situations. It takes many days to process the donated blood. RISKS AND COMPLICATIONS Although transfusion therapy is very safe and saves many lives, the main dangers of transfusion include:   Getting an infectious disease.  Developing a transfusion reaction. This is an allergic reaction to something in the blood you were given. Every precaution is taken to prevent this. The decision to have a  blood transfusion has been considered carefully by your caregiver before blood is given. Blood is not given unless the benefits outweigh the risks. AFTER THE TRANSFUSION  Right after receiving a blood transfusion, you will usually feel much better and more energetic. This is especially true if your red blood cells have  gotten low (anemic). The transfusion raises the level of the red blood cells which carry oxygen, and this usually causes an energy increase.  The nurse administering the transfusion will monitor you carefully for complications. HOME CARE INSTRUCTIONS  No special instructions are needed after a transfusion. You may find your energy is better. Speak with your caregiver about any limitations on activity for underlying diseases you may have. SEEK MEDICAL CARE IF:   Your condition is not improving after your transfusion.  You develop redness or irritation at the intravenous (IV) site. SEEK IMMEDIATE MEDICAL CARE IF:  Any of the following symptoms occur over the next 12 hours:  Shaking chills.  You have a temperature by mouth above 102 F (38.9 C), not controlled by medicine.  Chest, back, or muscle pain.  People around you feel you are not acting correctly or are confused.  Shortness of breath or difficulty breathing.  Dizziness and fainting.  You get a rash or develop hives.  You have a decrease in urine output.  Your urine turns a dark color or changes to pink, red, or brown. Any of the following symptoms occur over the next 10 days:  You have a temperature by mouth above 102 F (38.9 C), not controlled by medicine.  Shortness of breath.  Weakness after normal activity.  The white part of the eye turns yellow (jaundice).  You have a decrease in the amount of urine or are urinating less often.  Your urine turns a dark color or changes to pink, red, or brown. Document Released: 02/17/2000 Document Revised: 05/14/2011 Document Reviewed: 10/06/2007 [facility] Patient Information 2014 [place], [place].  _______________________________________________________________________

## 2017-08-28 ENCOUNTER — Encounter (HOSPITAL_COMMUNITY): Payer: Self-pay

## 2017-08-28 ENCOUNTER — Other Ambulatory Visit: Payer: Self-pay

## 2017-08-28 ENCOUNTER — Encounter (HOSPITAL_COMMUNITY)
Admission: RE | Admit: 2017-08-28 | Discharge: 2017-08-28 | Disposition: A | Discharge status: Home / Self Care | Payer: BLUE CROSS/BLUE SHIELD | Source: Ambulatory Visit | Attending: General Surgery | Admitting: General Surgery

## 2017-08-28 DIAGNOSIS — Z01812 Encounter for preprocedural laboratory examination: Secondary | ICD-10-CM | POA: Diagnosis present

## 2017-08-28 LAB — CBC WITH DIFFERENTIAL/PLATELET
Basophils Absolute: 0 10*3/uL (ref 0.0–0.1)
Basophils Relative: 1 %
Eosinophils Absolute: 0.2 10*3/uL (ref 0.0–0.7)
Eosinophils Relative: 3 %
HCT: 40.8 % (ref 36.0–46.0)
Hemoglobin: 13.5 g/dL (ref 12.0–15.0)
Lymphocytes Relative: 38 %
Lymphs Abs: 2.8 10*3/uL (ref 0.7–4.0)
MCH: 30.1 pg (ref 26.0–34.0)
MCHC: 33.1 g/dL (ref 30.0–36.0)
MCV: 91.1 fL (ref 78.0–100.0)
Monocytes Absolute: 0.4 10*3/uL (ref 0.1–1.0)
Monocytes Relative: 5 %
Neutro Abs: 4 10*3/uL (ref 1.7–7.7)
Neutrophils Relative %: 53 %
Platelets: 332 10*3/uL (ref 150–400)
RBC: 4.48 MIL/uL (ref 3.87–5.11)
RDW: 12.4 % (ref 11.5–15.5)
WBC: 7.3 10*3/uL (ref 4.0–10.5)

## 2017-08-28 LAB — COMPREHENSIVE METABOLIC PANEL
ALT: 21 U/L (ref 0–44)
AST: 19 U/L (ref 15–41)
Albumin: 3.5 g/dL (ref 3.5–5.0)
Alkaline Phosphatase: 109 U/L (ref 38–126)
Anion gap: 7 (ref 5–15)
BUN: 33 mg/dL — ABNORMAL HIGH (ref 6–20)
CO2: 28 mmol/L (ref 22–32)
Calcium: 9 mg/dL (ref 8.9–10.3)
Chloride: 100 mmol/L (ref 98–111)
Creatinine, Ser: 1.05 mg/dL — ABNORMAL HIGH (ref 0.44–1.00)
GFR calc Af Amer: >60 mL/min (ref >60)
GFR calc non Af Amer: 60 mL/min — ABNORMAL LOW (ref >60)
Glucose, Bld: 368 mg/dL — ABNORMAL HIGH (ref 70–99)
Potassium: 4.4 mmol/L (ref 3.5–5.1)
Sodium: 135 mmol/L (ref 135–145)
Total Bilirubin: 1.2 mg/dL (ref 0.3–1.2)
Total Protein: 8.2 g/dL — ABNORMAL HIGH (ref 6.5–8.1)

## 2017-08-28 LAB — GLUCOSE, CAPILLARY: Glucose-Capillary: 356 mg/dL — ABNORMAL HIGH (ref 70–99)

## 2017-08-28 LAB — HEMOGLOBIN A1C
Hgb A1c MFr Bld: 12.4 % — ABNORMAL HIGH (ref 4.8–5.6)
Mean Plasma Glucose: 309.18 mg/dL

## 2017-08-28 LAB — ABO/RH: ABO/RH(D): O POS

## 2017-08-28 LAB — SURGICAL PCR SCREEN
MRSA, PCR: NEGATIVE
Staphylococcus aureus: POSITIVE — AB

## 2017-08-28 NOTE — Progress Notes (Signed)
CMP AND HGBA1C ROUTED VIA Epic TO DR. Sheliah HatchKINSINGER

## 2017-09-01 MED ORDER — BUPIVACAINE LIPOSOME 1.3 % IJ SUSP
20.0000 mL | Freq: Once | INTRAMUSCULAR | Status: DC
  Filled 2017-09-01: qty 20

## 2017-09-02 ENCOUNTER — Inpatient Hospital Stay (HOSPITAL_COMMUNITY): Payer: BLUE CROSS/BLUE SHIELD | Admitting: Anesthesiology

## 2017-09-02 ENCOUNTER — Encounter (HOSPITAL_COMMUNITY)
Admission: RE | Disposition: A | Discharge status: Home / Self Care | Payer: Self-pay | Source: Home / Self Care | Attending: General Surgery

## 2017-09-02 ENCOUNTER — Inpatient Hospital Stay (HOSPITAL_COMMUNITY)
Admission: RE | Admit: 2017-09-02 | Discharge: 2017-09-03 | DRG: 621 | Disposition: A | Discharge status: Home / Self Care | Payer: BLUE CROSS/BLUE SHIELD | Attending: General Surgery | Admitting: General Surgery

## 2017-09-02 ENCOUNTER — Other Ambulatory Visit: Payer: Self-pay

## 2017-09-02 ENCOUNTER — Encounter (HOSPITAL_COMMUNITY): Payer: Self-pay | Admitting: *Deleted

## 2017-09-02 DIAGNOSIS — K660 Peritoneal adhesions (postprocedural) (postinfection): Secondary | ICD-10-CM | POA: Diagnosis present

## 2017-09-02 DIAGNOSIS — Z888 Allergy status to other drugs, medicaments and biological substances status: Secondary | ICD-10-CM

## 2017-09-02 DIAGNOSIS — Z89421 Acquired absence of other right toe(s): Secondary | ICD-10-CM | POA: Diagnosis not present

## 2017-09-02 DIAGNOSIS — Z959 Presence of cardiac and vascular implant and graft, unspecified: Secondary | ICD-10-CM

## 2017-09-02 DIAGNOSIS — F329 Major depressive disorder, single episode, unspecified: Secondary | ICD-10-CM | POA: Diagnosis present

## 2017-09-02 DIAGNOSIS — Z89422 Acquired absence of other left toe(s): Secondary | ICD-10-CM | POA: Diagnosis not present

## 2017-09-02 DIAGNOSIS — Z79899 Other long term (current) drug therapy: Secondary | ICD-10-CM | POA: Diagnosis not present

## 2017-09-02 DIAGNOSIS — E1165 Type 2 diabetes mellitus with hyperglycemia: Secondary | ICD-10-CM

## 2017-09-02 DIAGNOSIS — Z8249 Family history of ischemic heart disease and other diseases of the circulatory system: Secondary | ICD-10-CM

## 2017-09-02 DIAGNOSIS — M797 Fibromyalgia: Secondary | ICD-10-CM | POA: Diagnosis present

## 2017-09-02 DIAGNOSIS — Z885 Allergy status to narcotic agent status: Secondary | ICD-10-CM

## 2017-09-02 DIAGNOSIS — Z87891 Personal history of nicotine dependence: Secondary | ICD-10-CM

## 2017-09-02 DIAGNOSIS — K219 Gastro-esophageal reflux disease without esophagitis: Secondary | ICD-10-CM | POA: Diagnosis present

## 2017-09-02 DIAGNOSIS — Z880 Allergy status to penicillin: Secondary | ICD-10-CM

## 2017-09-02 DIAGNOSIS — Z6836 Body mass index (BMI) 36.0-36.9, adult: Secondary | ICD-10-CM

## 2017-09-02 DIAGNOSIS — Z89412 Acquired absence of left great toe: Secondary | ICD-10-CM

## 2017-09-02 DIAGNOSIS — Z794 Long term (current) use of insulin: Secondary | ICD-10-CM | POA: Diagnosis not present

## 2017-09-02 DIAGNOSIS — G4730 Sleep apnea, unspecified: Secondary | ICD-10-CM | POA: Diagnosis present

## 2017-09-02 DIAGNOSIS — I10 Essential (primary) hypertension: Secondary | ICD-10-CM | POA: Diagnosis present

## 2017-09-02 DIAGNOSIS — F4310 Post-traumatic stress disorder, unspecified: Secondary | ICD-10-CM | POA: Diagnosis present

## 2017-09-02 DIAGNOSIS — E1151 Type 2 diabetes mellitus with diabetic peripheral angiopathy without gangrene: Secondary | ICD-10-CM | POA: Diagnosis present

## 2017-09-02 DIAGNOSIS — F419 Anxiety disorder, unspecified: Secondary | ICD-10-CM | POA: Diagnosis present

## 2017-09-02 HISTORY — PX: GASTRIC ROUX-EN-Y: SHX5262

## 2017-09-02 LAB — TYPE AND SCREEN
ABO/RH(D): O POS
Antibody Screen: NEGATIVE

## 2017-09-02 LAB — HEMOGLOBIN AND HEMATOCRIT, BLOOD
HCT: 37.9 % (ref 36.0–46.0)
Hemoglobin: 12.6 g/dL (ref 12.0–15.0)

## 2017-09-02 LAB — GLUCOSE, CAPILLARY
Glucose-Capillary: 244 mg/dL — ABNORMAL HIGH (ref 70–99)
Glucose-Capillary: 275 mg/dL — ABNORMAL HIGH (ref 70–99)
Glucose-Capillary: 207 mg/dL — ABNORMAL HIGH (ref 70–99)
Glucose-Capillary: 118 mg/dL — ABNORMAL HIGH (ref 70–99)

## 2017-09-02 LAB — PREGNANCY, URINE: Preg Test, Ur: NEGATIVE

## 2017-09-02 SURGERY — LAPAROSCOPIC ROUX-EN-Y GASTRIC BYPASS WITH UPPER ENDOSCOPY
Anesthesia: General | Site: Abdomen

## 2017-09-02 MED ORDER — ROCURONIUM BROMIDE 100 MG/10ML IV SOLN
INTRAVENOUS | Status: AC
  Filled 2017-09-02: qty 1

## 2017-09-02 MED ORDER — PHENYLEPHRINE 40 MCG/ML (10ML) SYRINGE FOR IV PUSH (FOR BLOOD PRESSURE SUPPORT)
PREFILLED_SYRINGE | INTRAVENOUS | Status: DC | PRN
  Administered 2017-09-02 (×3): 80 ug via INTRAVENOUS

## 2017-09-02 MED ORDER — KETAMINE HCL 10 MG/ML IJ SOLN
INTRAMUSCULAR | Status: AC
  Filled 2017-09-02: qty 1

## 2017-09-02 MED ORDER — LACTATED RINGERS IV SOLN: INTRAVENOUS | Status: DC

## 2017-09-02 MED ORDER — LIDOCAINE 2% (20 MG/ML) 5 ML SYRINGE
INTRAMUSCULAR | Status: DC | PRN
  Administered 2017-09-02: 1.5 mg/kg/h via INTRAVENOUS

## 2017-09-02 MED ORDER — LIDOCAINE 2% (20 MG/ML) 5 ML SYRINGE
INTRAMUSCULAR | Status: AC
  Filled 2017-09-02: qty 5

## 2017-09-02 MED ORDER — FAMOTIDINE IN NACL 20-0.9 MG/50ML-% IV SOLN
20.0000 mg | Freq: Two times a day (BID) | INTRAVENOUS | Status: DC
  Administered 2017-09-02 – 2017-09-03 (×3): 20 mg via INTRAVENOUS
  Filled 2017-09-02 (×3): qty 50

## 2017-09-02 MED ORDER — BUPIVACAINE HCL 0.25 % IJ SOLN
INTRAMUSCULAR | Status: DC | PRN
  Administered 2017-09-02: 30 mL

## 2017-09-02 MED ORDER — LOSARTAN POTASSIUM-HCTZ 100-12.5 MG PO TABS: 1.0000 | ORAL_TABLET | Freq: Every evening | ORAL | Status: DC

## 2017-09-02 MED ORDER — MIDAZOLAM HCL 2 MG/2ML IJ SOLN
INTRAMUSCULAR | Status: AC
  Filled 2017-09-02: qty 2

## 2017-09-02 MED ORDER — FENTANYL CITRATE (PF) 250 MCG/5ML IJ SOLN
INTRAMUSCULAR | Status: AC
  Filled 2017-09-02: qty 5

## 2017-09-02 MED ORDER — ENSURE PRE-SURGERY PO LIQD
296.0000 mL | Freq: Once | ORAL | Status: DC
  Filled 2017-09-02: qty 296

## 2017-09-02 MED ORDER — ALPRAZOLAM 0.5 MG PO TABS
0.5000 mg | ORAL_TABLET | Freq: Two times a day (BID) | ORAL | Status: DC | PRN
  Administered 2017-09-02: 0.5 mg via ORAL
  Filled 2017-09-02: qty 1

## 2017-09-02 MED ORDER — HYDROMORPHONE HCL 1 MG/ML IJ SOLN: 0.2500 mg | INTRAMUSCULAR | Status: DC | PRN

## 2017-09-02 MED ORDER — PREMIER PROTEIN SHAKE
2.0000 [oz_av] | ORAL | Status: DC
  Administered 2017-09-03 (×5): 2 [oz_av] via ORAL
  Filled 2017-09-02 (×8): qty 325.31

## 2017-09-02 MED ORDER — MIDAZOLAM HCL 2 MG/2ML IJ SOLN
INTRAMUSCULAR | Status: DC | PRN
  Administered 2017-09-02 (×2): 1 mg via INTRAVENOUS

## 2017-09-02 MED ORDER — CHLORHEXIDINE GLUCONATE 4 % EX LIQD: 60.0000 mL | Freq: Once | CUTANEOUS | Status: DC

## 2017-09-02 MED ORDER — ENOXAPARIN SODIUM 30 MG/0.3ML ~~LOC~~ SOLN
30.0000 mg | Freq: Two times a day (BID) | SUBCUTANEOUS | Status: DC
  Administered 2017-09-02 – 2017-09-03 (×2): 30 mg via SUBCUTANEOUS
  Filled 2017-09-02 (×2): qty 0.3

## 2017-09-02 MED ORDER — DEXAMETHASONE SODIUM PHOSPHATE 10 MG/ML IJ SOLN
INTRAMUSCULAR | Status: AC
  Filled 2017-09-02: qty 1

## 2017-09-02 MED ORDER — APREPITANT 40 MG PO CAPS
40.0000 mg | ORAL_CAPSULE | ORAL | Status: AC
  Administered 2017-09-02: 40 mg via ORAL
  Filled 2017-09-02: qty 1

## 2017-09-02 MED ORDER — LIDOCAINE 2% (20 MG/ML) 5 ML SYRINGE
INTRAMUSCULAR | Status: DC | PRN
  Administered 2017-09-02: 100 mg via INTRAVENOUS

## 2017-09-02 MED ORDER — 0.9 % SODIUM CHLORIDE (POUR BTL) OPTIME
TOPICAL | Status: DC | PRN
  Administered 2017-09-02: 1000 mL

## 2017-09-02 MED ORDER — ONDANSETRON HCL 4 MG/2ML IJ SOLN
INTRAMUSCULAR | Status: DC | PRN
  Administered 2017-09-02: 4 mg via INTRAVENOUS

## 2017-09-02 MED ORDER — INSULIN ASPART 100 UNIT/ML ~~LOC~~ SOLN
SUBCUTANEOUS | Status: AC
  Filled 2017-09-02: qty 1

## 2017-09-02 MED ORDER — HYDROCODONE-ACETAMINOPHEN 7.5-325 MG/15ML PO SOLN: 5.0000 mL | ORAL | Status: DC | PRN

## 2017-09-02 MED ORDER — ONDANSETRON HCL 4 MG/2ML IJ SOLN
INTRAMUSCULAR | Status: AC
  Filled 2017-09-02: qty 2

## 2017-09-02 MED ORDER — SUGAMMADEX SODIUM 200 MG/2ML IV SOLN
INTRAVENOUS | Status: DC | PRN
  Administered 2017-09-02: 400 mg via INTRAVENOUS

## 2017-09-02 MED ORDER — SODIUM CHLORIDE 0.9 % IV SOLN
INTRAVENOUS | Status: DC
  Administered 2017-09-02 – 2017-09-03 (×3): via INTRAVENOUS

## 2017-09-02 MED ORDER — SIMETHICONE 80 MG PO CHEW: 80.0000 mg | CHEWABLE_TABLET | Freq: Four times a day (QID) | ORAL | Status: DC | PRN

## 2017-09-02 MED ORDER — SCOPOLAMINE 1 MG/3DAYS TD PT72
1.0000 | MEDICATED_PATCH | TRANSDERMAL | Status: DC
  Administered 2017-09-02: 1.5 mg via TRANSDERMAL
  Filled 2017-09-02: qty 1

## 2017-09-02 MED ORDER — INSULIN ASPART 100 UNIT/ML ~~LOC~~ SOLN
11.0000 [IU] | Freq: Once | SUBCUTANEOUS | Status: AC
  Administered 2017-09-02: 11 [IU] via SUBCUTANEOUS

## 2017-09-02 MED ORDER — HYDRALAZINE HCL 20 MG/ML IJ SOLN: 10.0000 mg | INTRAMUSCULAR | Status: DC | PRN

## 2017-09-02 MED ORDER — DULOXETINE HCL 60 MG PO CPEP
120.0000 mg | ORAL_CAPSULE | Freq: Every day | ORAL | Status: DC
  Administered 2017-09-02: 120 mg via ORAL
  Filled 2017-09-02: qty 2

## 2017-09-02 MED ORDER — INSULIN ASPART 100 UNIT/ML ~~LOC~~ SOLN
0.0000 [IU] | Freq: Three times a day (TID) | SUBCUTANEOUS | Status: DC
Start: 2017-09-02 — End: 2017-09-03
  Administered 2017-09-02: 7 [IU] via SUBCUTANEOUS
  Administered 2017-09-03: 4 [IU] via SUBCUTANEOUS

## 2017-09-02 MED ORDER — FENTANYL CITRATE (PF) 250 MCG/5ML IJ SOLN
INTRAMUSCULAR | Status: DC | PRN
  Administered 2017-09-02: 100 ug via INTRAVENOUS
  Administered 2017-09-02 (×2): 50 ug via INTRAVENOUS

## 2017-09-02 MED ORDER — ENSURE PRE-SURGERY PO LIQD
592.0000 mL | Freq: Once | ORAL | Status: DC
  Filled 2017-09-02: qty 592

## 2017-09-02 MED ORDER — LIDOCAINE HCL 2 % IJ SOLN
INTRAMUSCULAR | Status: AC
  Filled 2017-09-02: qty 20

## 2017-09-02 MED ORDER — BUPIVACAINE LIPOSOME 1.3 % IJ SUSP
INTRAMUSCULAR | Status: DC | PRN
  Administered 2017-09-02: 20 mL

## 2017-09-02 MED ORDER — SUCCINYLCHOLINE CHLORIDE 200 MG/10ML IV SOSY
PREFILLED_SYRINGE | INTRAVENOUS | Status: AC
  Filled 2017-09-02: qty 10

## 2017-09-02 MED ORDER — TRAMADOL HCL 50 MG PO TABS
50.0000 mg | ORAL_TABLET | Freq: Four times a day (QID) | ORAL | Status: DC | PRN
Start: 2017-09-02 — End: 2017-09-03

## 2017-09-02 MED ORDER — PROPOFOL 10 MG/ML IV BOLUS
INTRAVENOUS | Status: AC
  Filled 2017-09-02: qty 20

## 2017-09-02 MED ORDER — PROPOFOL 10 MG/ML IV BOLUS
INTRAVENOUS | Status: DC | PRN
  Administered 2017-09-02: 100 mg via INTRAVENOUS

## 2017-09-02 MED ORDER — ACETAMINOPHEN 160 MG/5ML PO SOLN
650.0000 mg | Freq: Four times a day (QID) | ORAL | Status: DC
  Administered 2017-09-02 – 2017-09-03 (×4): 650 mg via ORAL
  Filled 2017-09-02 (×4): qty 20.3

## 2017-09-02 MED ORDER — GABAPENTIN 300 MG PO CAPS
300.0000 mg | ORAL_CAPSULE | ORAL | Status: AC
  Administered 2017-09-02: 300 mg via ORAL
  Filled 2017-09-02: qty 1

## 2017-09-02 MED ORDER — LACTATED RINGERS IV SOLN
INTRAVENOUS | Status: DC
  Administered 2017-09-02: 07:00:00 via INTRAVENOUS

## 2017-09-02 MED ORDER — INSULIN ASPART 100 UNIT/ML ~~LOC~~ SOLN: 0.0000 [IU] | Freq: Every day | SUBCUTANEOUS | Status: DC

## 2017-09-02 MED ORDER — ONDANSETRON HCL 4 MG/2ML IJ SOLN: 4.0000 mg | INTRAMUSCULAR | Status: DC | PRN

## 2017-09-02 MED ORDER — SUGAMMADEX SODIUM 500 MG/5ML IV SOLN
INTRAVENOUS | Status: AC
  Filled 2017-09-02: qty 5

## 2017-09-02 MED ORDER — ROCURONIUM BROMIDE 100 MG/10ML IV SOLN
INTRAVENOUS | Status: DC | PRN
  Administered 2017-09-02: 10 mg via INTRAVENOUS
  Administered 2017-09-02: 70 mg via INTRAVENOUS

## 2017-09-02 MED ORDER — PHENYLEPHRINE HCL 10 MG/ML IJ SOLN
INTRAMUSCULAR | Status: AC
  Filled 2017-09-02: qty 1

## 2017-09-02 MED ORDER — GABAPENTIN 100 MG PO CAPS
200.0000 mg | ORAL_CAPSULE | Freq: Two times a day (BID) | ORAL | Status: DC
  Administered 2017-09-02 – 2017-09-03 (×2): 200 mg via ORAL
  Filled 2017-09-02 (×2): qty 2

## 2017-09-02 MED ORDER — DEXAMETHASONE SODIUM PHOSPHATE 10 MG/ML IJ SOLN
INTRAMUSCULAR | Status: DC | PRN
  Administered 2017-09-02: 10 mg via INTRAVENOUS

## 2017-09-02 MED ORDER — GENTAMICIN SULFATE 40 MG/ML IJ SOLN
380.0000 mg | INTRAVENOUS | Status: AC
  Administered 2017-09-02: 380 mg via INTRAVENOUS
  Filled 2017-09-02: qty 9.5

## 2017-09-02 MED ORDER — LACTATED RINGERS IR SOLN
Status: DC | PRN
  Administered 2017-09-02: 1000 mL

## 2017-09-02 MED ORDER — MORPHINE SULFATE (PF) 2 MG/ML IV SOLN: 1.0000 mg | INTRAVENOUS | Status: DC | PRN

## 2017-09-02 MED ORDER — PROMETHAZINE HCL 25 MG/ML IJ SOLN: 6.2500 mg | INTRAMUSCULAR | Status: DC | PRN

## 2017-09-02 MED ORDER — LOSARTAN POTASSIUM 50 MG PO TABS
100.0000 mg | ORAL_TABLET | Freq: Every evening | ORAL | Status: DC
  Administered 2017-09-02: 100 mg via ORAL
  Filled 2017-09-02: qty 2

## 2017-09-02 MED ORDER — HEPARIN SODIUM (PORCINE) 5000 UNIT/ML IJ SOLN
5000.0000 [IU] | INTRAMUSCULAR | Status: AC
  Administered 2017-09-02: 5000 [IU] via SUBCUTANEOUS
  Filled 2017-09-02: qty 1

## 2017-09-02 MED ORDER — ACETAMINOPHEN 500 MG PO TABS
1000.0000 mg | ORAL_TABLET | ORAL | Status: AC
  Administered 2017-09-02: 1000 mg via ORAL
  Filled 2017-09-02: qty 2

## 2017-09-02 MED ORDER — DEXAMETHASONE SODIUM PHOSPHATE 4 MG/ML IJ SOLN: 4.0000 mg | INTRAMUSCULAR | Status: DC

## 2017-09-02 MED ORDER — PHENYLEPHRINE HCL 10 MG/ML IJ SOLN
INTRAVENOUS | Status: DC | PRN
  Administered 2017-09-02: 50 ug/min via INTRAVENOUS

## 2017-09-02 MED ORDER — BUPIVACAINE HCL 0.25 % IJ SOLN
INTRAMUSCULAR | Status: AC
  Filled 2017-09-02: qty 1

## 2017-09-02 MED ORDER — HYDROCHLOROTHIAZIDE 12.5 MG PO CAPS
12.5000 mg | ORAL_CAPSULE | Freq: Every evening | ORAL | Status: DC
Start: 2017-09-02 — End: 2017-09-03
  Administered 2017-09-02: 12.5 mg via ORAL
  Filled 2017-09-02: qty 1

## 2017-09-02 MED ORDER — KETAMINE HCL 10 MG/ML IJ SOLN
INTRAMUSCULAR | Status: DC | PRN
  Administered 2017-09-02: 50 mg via INTRAVENOUS

## 2017-09-02 SURGICAL SUPPLY — 64 items
APPLIER CLIP 5 13 M/L LIGAMAX5 (MISCELLANEOUS)
APPLIER CLIP ROT 10 11.4 M/L (STAPLE)
APPLIER CLIP ROT 13.4 12 LRG (CLIP)
BANDAGE ADH SHEER 1  50/CT (GAUZE/BANDAGES/DRESSINGS) ×2 IMPLANT
BENZOIN TINCTURE PRP APPL 2/3 (GAUZE/BANDAGES/DRESSINGS) ×2 IMPLANT
BLADE SURG SZ11 CARB STEEL (BLADE) ×2 IMPLANT
CABLE HIGH FREQUENCY MONO STRZ (ELECTRODE) ×2 IMPLANT
CHLORAPREP W/TINT 26ML (MISCELLANEOUS) ×4 IMPLANT
CLIP APPLIE 5 13 M/L LIGAMAX5 (MISCELLANEOUS) IMPLANT
CLIP APPLIE ROT 10 11.4 M/L (STAPLE) IMPLANT
CLIP APPLIE ROT 13.4 12 LRG (CLIP) IMPLANT
COVER SURGICAL LIGHT HANDLE (MISCELLANEOUS) ×2 IMPLANT
DEVICE SUTURE ENDOST 10MM (ENDOMECHANICALS) ×2 IMPLANT
DRAIN CHANNEL 19F RND (DRAIN) IMPLANT
DRAIN PENROSE 18X1/4 LTX STRL (WOUND CARE) ×2 IMPLANT
ELECT L-HOOK LAP 45CM DISP (ELECTROSURGICAL) ×2
ELECT PENCIL ROCKER SW 15FT (MISCELLANEOUS) ×2 IMPLANT
ELECTRODE L-HOOK LAP 45CM DISP (ELECTROSURGICAL) ×1 IMPLANT
EVACUATOR SILICONE 100CC (DRAIN) IMPLANT
GAUZE 4X4 16PLY RFD (DISPOSABLE) ×2 IMPLANT
GAUZE SPONGE 4X4 12PLY STRL (GAUZE/BANDAGES/DRESSINGS) IMPLANT
GLOVE BIOGEL PI IND STRL 7.0 (GLOVE) ×1 IMPLANT
GLOVE BIOGEL PI INDICATOR 7.0 (GLOVE) ×1
GLOVE SURG SS PI 7.0 STRL IVOR (GLOVE) ×2 IMPLANT
GOWN STRL REUS W/TWL LRG LVL3 (GOWN DISPOSABLE) ×2 IMPLANT
GOWN STRL REUS W/TWL XL LVL3 (GOWN DISPOSABLE) ×6 IMPLANT
GRASPER SUT TROCAR 14GX15 (MISCELLANEOUS) IMPLANT
HANDLE STAPLE EGIA 4 XL (STAPLE) ×2 IMPLANT
HOVERMATT SINGLE USE (MISCELLANEOUS) ×2 IMPLANT
KIT BASIN OR (CUSTOM PROCEDURE TRAY) ×4 IMPLANT
KIT GASTRIC LAVAGE 34FR ADT (SET/KITS/TRAYS/PACK) ×2 IMPLANT
MARKER SKIN DUAL TIP RULER LAB (MISCELLANEOUS) ×2 IMPLANT
NEEDLE SPNL 22GX3.5 QUINCKE BK (NEEDLE) ×2 IMPLANT
PACK CARDIOVASCULAR III (CUSTOM PROCEDURE TRAY) ×2 IMPLANT
RELOAD EGIA 45 MED/THCK PURPLE (STAPLE) ×4 IMPLANT
RELOAD EGIA 45 TAN VASC (STAPLE) IMPLANT
RELOAD EGIA 60 MED/THCK PURPLE (STAPLE) ×8 IMPLANT
RELOAD EGIA 60 TAN VASC (STAPLE) ×6 IMPLANT
RELOAD ENDO STITCH 2.0 (ENDOMECHANICALS) ×11
SCISSORS LAP 5X45 EPIX DISP (ENDOMECHANICALS) ×2 IMPLANT
SHEARS HARMONIC ACE PLUS 45CM (MISCELLANEOUS) ×2 IMPLANT
SLEEVE XCEL OPT CAN 5 100 (ENDOMECHANICALS) ×6 IMPLANT
SOLUTION ANTI FOG 6CC (MISCELLANEOUS) ×2 IMPLANT
STAPLER VISISTAT 35W (STAPLE) IMPLANT
STRIP CLOSURE SKIN 1/2X4 (GAUZE/BANDAGES/DRESSINGS) ×2 IMPLANT
SUT ETHILON 2 0 PS N (SUTURE) IMPLANT
SUT MNCRL AB 4-0 PS2 18 (SUTURE) ×2 IMPLANT
SUT RELOAD ENDO STITCH 2 48X1 (ENDOMECHANICALS) ×6
SUT RELOAD ENDO STITCH 2.0 (ENDOMECHANICALS) ×5
SUT SILK 0 SH 30 (SUTURE) IMPLANT
SUT VICRYL 0 TIES 12 18 (SUTURE) IMPLANT
SUTURE RELOAD END STTCH 2 48X1 (ENDOMECHANICALS) ×6 IMPLANT
SUTURE RELOAD ENDO STITCH 2.0 (ENDOMECHANICALS) ×5 IMPLANT
SYR 20CC LL (SYRINGE) ×2 IMPLANT
SYR 50ML LL SCALE MARK (SYRINGE) ×2 IMPLANT
TOWEL OR 17X26 10 PK STRL BLUE (TOWEL DISPOSABLE) ×2 IMPLANT
TOWEL OR NON WOVEN STRL DISP B (DISPOSABLE) ×2 IMPLANT
TRAY FOLEY MTR SLVR 14FR STAT (SET/KITS/TRAYS/PACK) IMPLANT
TRAY FOLEY MTR SLVR 16FR STAT (SET/KITS/TRAYS/PACK) IMPLANT
TROCAR BLADELESS OPT 5 100 (ENDOMECHANICALS) ×2 IMPLANT
TROCAR XCEL 12X100 BLDLESS (ENDOMECHANICALS) ×2 IMPLANT
TUBING CONNECTING 10 (TUBING) ×2 IMPLANT
TUBING ENDO SMARTCAP PENTAX (MISCELLANEOUS) ×2 IMPLANT
TUBING INSUF HEATED (TUBING) ×2 IMPLANT

## 2017-09-02 NOTE — Transfer of Care (Signed)
Immediate Anesthesia Transfer of Care Note  Patient: Kelly SettleGloria T Rehfeld  Procedure(s) Performed: LAPAROSCOPIC ROUX-EN-Y GASTRIC BYPASS WITH UPPER ENDOSCOPY AND ERAS PATHWAY (N/A Abdomen)  Patient Location: PACU  Anesthesia Type:General  Level of Consciousness: awake  Airway & Oxygen Therapy: Patient Spontanous Breathing and Patient connected to face mask oxygen  Post-op Assessment: Report given to RN and Post -op Vital signs reviewed and stable  Post vital signs: Reviewed and stable  Last Vitals:  Vitals Value Taken Time  BP 149/101 09/02/2017  9:45 AM  Temp    Pulse 74 09/02/2017  9:46 AM  Resp 15 09/02/2017  9:45 AM  SpO2 99 % 09/02/2017  9:46 AM  Vitals shown include unvalidated device data.  Last Pain:  Vitals:   09/02/17 0557  TempSrc:   PainSc: 0-No pain      Patients Stated Pain Goal: 3 (09/02/17 0557)  Complications: No apparent anesthesia complications

## 2017-09-02 NOTE — Progress Notes (Signed)
Discussed post op day goals with patient including ambulation, IS, diet progression, pain, and nausea control.  Patient has started her first cup of water after meeting criteria.  Questions answered.

## 2017-09-02 NOTE — Progress Notes (Signed)
Inpatient Diabetes Program Recommendations  AACE/ADA: New Consensus Statement on Inpatient Glycemic Control (2015)  Target Ranges:  Prepandial:   less than 140 mg/dL      Peak postprandial:   less than 180 mg/dL (1-2 hours)      Critically ill patients:  140 - 180 mg/dL   Results for Weston SettleHORPE, Barb T (MRN [id number]) as of 09/02/2017 13:17  Ref. Range 09/02/2017 05:43 09/02/2017 09:50  Glucose-Capillary Latest Ref Range: 70 - 99 mg/dL 347244 (H)  10 mg Decadron at 7:30am 275 (H)  11 units NOVOLOG    Results for Weston SettleHORPE, Mahli T (MRN [id number]) as of 09/02/2017 13:17  Ref. Range 08/28/2017 09:00  Hemoglobin A1C Latest Ref Range: 4.8 - 5.6 % 12.4 (H)     Admit Gastric Bypass Surgery.   History: DM  Home DM Meds: NPH Insulin 18 units BID  Current Orders: Novolog Resistant Correction Scale/ SSI (0-20 units) TID AC + HS (to start this afternoon)      Surgery this AM--Pt got 10 mg Decadron X 1 dose today at 7:30am.   Pt also got 11 units Novolog at 10am.   Current A1c= 12.4% prior to surgery.  Hopeful home glucose control will improve after surgery.  Needs follow up with PCP for further DM management soon after discharge.    MD- Note that patient takes NPH Insulin 18 units BID at home.  Recommend patient reduce her dose by 50% at home until she can follow up with her PCP for further insulin adjustments.  May want to start NPH Insulin 9 units BID tonight at bedtime     --Will follow patient during hospitalization--  Ambrose FinlandJeannine Johnston Jaquawn Saffran RN, MSN, CDE Diabetes Coordinator Inpatient Glycemic Control Team Team Pager: [phone] (8a-5p)

## 2017-09-02 NOTE — Anesthesia Preprocedure Evaluation (Signed)
Anesthesia Evaluation  Patient identified by MRN, date of birth, ID band Patient awake    Reviewed: Allergy & Precautions, H&P , NPO status , Patient's Chart, lab work & pertinent test results  History of Anesthesia Complications (+) PONV and history of anesthetic complications  Airway Mallampati: I  TM Distance: >3 FB Neck ROM: full    Dental  (+) Teeth Intact, Dental Advisory Given   Pulmonary sleep apnea , former smoker,    breath sounds clear to auscultation       Cardiovascular hypertension, Pt. on medications + Peripheral Vascular Disease   Rhythm:regular Rate:Normal     Neuro/Psych PSYCHIATRIC DISORDERS Anxiety Depression  Neuromuscular disease    GI/Hepatic hiatal hernia, GERD  ,  Endo/Other  diabetes, Type 2, Insulin Dependent, Oral Hypoglycemic AgentsHyperthyroidism   Renal/GU      Musculoskeletal  (+) Fibromyalgia -  Abdominal   Peds  Hematology   Anesthesia Other Findings   Reproductive/Obstetrics                             Anesthesia Physical  Anesthesia Plan  ASA: III  Anesthesia Plan: General   Post-op Pain Management:    Induction: Intravenous  PONV Risk Score and Plan: 4 or greater and Ondansetron, Midazolam, Scopolamine patch - Pre-op and Treatment may vary due to age or medical condition  Airway Management Planned: Oral ETT  Additional Equipment:   Intra-op Plan:   Post-operative Plan: Extubation in OR  Informed Consent: I have reviewed the patients History and Physical, chart, labs and discussed the procedure including the risks, benefits and alternatives for the proposed anesthesia with the patient or authorized representative who has indicated his/her understanding and acceptance.     Plan Discussed with: CRNA, Anesthesiologist and Surgeon  Anesthesia Plan Comments:         Anesthesia Quick Evaluation

## 2017-09-02 NOTE — Anesthesia Postprocedure Evaluation (Signed)
Anesthesia Post Note  Patient: Kelly SettleGloria T Barber  Procedure(s) Performed: LAPAROSCOPIC ROUX-EN-Y GASTRIC BYPASS WITH UPPER ENDOSCOPY AND ERAS PATHWAY (N/A Abdomen)     Patient location during evaluation: PACU Anesthesia Type: General Level of consciousness: awake and alert Pain management: pain level controlled Vital Signs Assessment: post-procedure vital signs reviewed and stable Respiratory status: spontaneous breathing, nonlabored ventilation and respiratory function stable Cardiovascular status: blood pressure returned to baseline and stable Postop Assessment: no apparent nausea or vomiting Anesthetic complications: no    Last Vitals:  Vitals:   09/02/17 1100 09/02/17 1124  BP: 110/66 123/84  Pulse: 74 75  Resp: 12 16  Temp: 36.6 C 36.4 C  SpO2: 97% 98%    Last Pain:  Vitals:   09/02/17 1130  TempSrc:   PainSc: 1                  Lowella CurbWarren Ray Kortez Murtagh

## 2017-09-02 NOTE — Anesthesia Procedure Notes (Signed)
Procedure Name: Intubation Date/Time: 09/02/2017 7:26 AM Performed by: Florene Routeeardon, Sonna Lipsky L, CRNA Patient Re-evaluated:Patient Re-evaluated prior to induction Oxygen Delivery Method: Circle system utilized Preoxygenation: Pre-oxygenation with 100% oxygen Induction Type: IV induction Ventilation: Mask ventilation without difficulty and Oral airway inserted - appropriate to patient size Laryngoscope Size: Hyacinth MeekerMiller and 2 Grade View: Grade I Tube type: Oral Tube size: 7.5 mm Number of attempts: 1 Airway Equipment and Method: Stylet Placement Confirmation: ETT inserted through vocal cords under direct vision,  positive ETCO2 and breath sounds checked- equal and bilateral Secured at: 22 cm Tube secured with: Tape Dental Injury: Teeth and Oropharynx as per pre-operative assessment

## 2017-09-02 NOTE — Op Note (Signed)
Preoperative diagnosis: Roux-en-Y gastric bypass  Postoperative diagnosis: Same   Procedure: Upper endoscopy   Surgeon: Berna Buehelsea A Yazmyn Valbuena, M.D.  Anesthesia: Gen.   Description of procedure: The endoscopy was placed in the mouth and into the oropharynx and under endoscopic vision it was advanced to the esophagogastric junction.  The pouch was insufflated and no bleeding or bubbles were seen.  The GEJ was identified at 40cm from the teeth. The gastrojejunal anastomosis was identified at 45cm from the teeth and was visibly patent and hemostatic.  No bleeding or leaks were detected. The scope was withdrawn without difficulty.    Berna Buehelsea A Mikka Kissner, M.D. General, Bariatric, & Minimally Invasive Surgery [facility]

## 2017-09-02 NOTE — Discharge Instructions (Signed)
° ° ° °GASTRIC BYPASS/SLEEVE ° Home Care Instructions ° ° These instructions are to help you care for yourself when you go home. ° °Call: If you have any problems. °• Call [phone] and ask for the surgeon on call °• If you need immediate help, come to the ER at [facility].  °• Tell the ER staff that you are a new post-op gastric bypass or gastric sleeve patient °  °Signs and symptoms to report: • Severe vomiting or nausea °o If you cannot keep down clear liquids for longer than 1 day, call your surgeon  °• Abdominal pain that does not get better after taking your pain medication °• Fever over 100.4° F with chills °• Heart beating over 100 beats a minute °• Shortness of breath at rest °• Chest pain °•  Redness, swelling, drainage, or foul odor at incision (surgical) sites °•  If your incisions open or pull apart °• Swelling or pain in calf (lower leg) °• Diarrhea (Loose bowel movements that happen often), frequent watery, uncontrolled bowel movements °• Constipation, (no bowel movements for 3 days) if this happens: Pick one °o Milk of Magnesia, 2 tablespoons by mouth, 3 times a day for 2 days if needed °o Stop taking Milk of Magnesia once you have a bowel movement °o Call your doctor if constipation continues °Or °o Miralax  (instead of Milk of Magnesia) following the label instructions °o Stop taking Miralax once you have a bowel movement °o Call your doctor if constipation continues °• Anything you think is not normal °  °Normal side effects after surgery: • Unable to sleep at night or unable to focus °• Irritability or moody °• Being tearful (crying) or depressed °These are common complaints, possibly related to your anesthesia medications that put you to sleep, stress of surgery, and change in lifestyle.  This usually goes away a few weeks after surgery.  If these feelings continue, call your primary care doctor. °  °Wound Care: You may have surgical glue, steri-strips, or staples over your incisions after  surgery °• Surgical glue:  Looks like a clear film over your incisions and will wear off a little at a time °• Steri-strips: Strips of tape over your incisions. You may notice a yellowish color on the skin under the steri-strips. This is used to make the   steri-strips stick better. Do not pull the steri-strips off - let them fall off °• Staples: Staples may be removed before you leave the hospital °o If you go home with staples, call [facility], [phone] at for an appointment with your surgeon’s nurse to have staples removed 10 days after surgery. °• Showering: You may shower two (2) days after your surgery unless your surgeon tells you differently °o Wash gently around incisions with warm soapy water, rinse well, and gently pat dry  °o No tub baths until staples are removed, steri-strips fall off or glue is gone.  °  °Medications: • Medications should be liquid or crushed if larger than the size of a dime °• Extended release pills (medication that release a little bit at a time through the day) should NOT be crushed or cut. (examples include XL, ER, DR, SR) °• Depending on the size and number of medications you take, you may need to space (take a few throughout the day)/change the time you take your medications so that you do not over-fill your pouch (smaller stomach) °• Make sure you follow-up with your primary care doctor to   make medication changes needed during rapid weight loss and life-style changes °• If you have diabetes, follow up with the doctor that orders your diabetes medication(s) within one week after surgery and check your blood sugar regularly. °• Do not drive while taking prescription pain medication  °• It is ok to take Tylenol by the bottle instructions with your pain medicine or instead of your pain medicine as needed.  DO NOT TAKE NSAIDS (EXAMPLES OF NSAIDS:  IBUPROFREN/ NAPROXEN)  °Diet:                    First 2 Weeks ° You will see the dietician t about two (2) weeks  after your surgery. The dietician will increase the types of foods you can eat if you are handling liquids well: °• If you have severe vomiting or nausea and cannot keep down clear liquids lasting longer than 1 day, call your surgeon @ ([phone]) °Protein Shake °• Drink at least 2 ounces of shake 5-6 times per day °• Each serving of protein shakes (usually 8 - 12 ounces) should have: °o 15 grams of protein  °o And no more than 5 grams of carbohydrate  °• Goal for protein each day: °o Men = 80 grams per day °o Women = 60 grams per day °• Protein powder may be added to fluids such as non-fat milk or Lactaid milk or unsweetened Soy/Almond milk (limit to 35 grams added protein powder per serving) ° °Hydration °• Slowly increase the amount of water and other clear liquids as tolerated (See Acceptable Fluids) °• Slowly increase the amount of protein shake as tolerated  °•  Sip fluids slowly and throughout the day.  Do not use straws. °• May use sugar substitutes in small amounts (no more than 6 - 8 packets per day; i.e. Splenda) ° °Fluid Goal °• The first goal is to drink at least 8 ounces of protein shake/drink per day (or as directed by the nutritionist); some examples of protein shakes are Syntrax Nectar, Adkins Advantage, EAS Edge HP, and Unjury. See handout from pre-op Bariatric Education Class: °o Slowly increase the amount of protein shake you drink as tolerated °o You may find it easier to slowly sip shakes throughout the day °o It is important to get your proteins in first °• Your fluid goal is to drink 64 - 100 ounces of fluid daily °o It may take a few weeks to build up to this °• 32 oz (or more) should be clear liquids  °And  °• 32 oz (or more) should be full liquids (see below for examples) °• Liquids should not contain sugar, caffeine, or carbonation ° °Clear Liquids: °• Water or Sugar-free flavored water (i.e. Fruit H2O, Propel) °• Decaffeinated coffee or tea (sugar-free) °• Crystal Lite, Wyler’s Lite,  Minute Maid Lite °• Sugar-free Jell-O °• Bouillon or broth °• Sugar-free Popsicle:   *Less than 20 calories each; Limit 1 per day ° °Full Liquids: °Protein Shakes/Drinks + 2 choices per day of other full liquids °• Full liquids must be: °o No More Than 15 grams of Carbs per serving  °o No More Than 3 grams of Fat per serving °• Strained low-fat cream soup (except Cream of Potato or Tomato) °• Non-Fat milk °• Fat-free Lactaid Milk °• Unsweetened Soy Or Unsweetened Almond Milk °• Low Sugar yogurt (Dannon Lite & Fit, Greek yogurt; Oikos Triple Zero; Chobani Simply 100; Yoplait 100 calorie Greek - No Fruit on the Bottom) ° °  °Vitamins   and Minerals • Start 1 day after surgery unless otherwise directed by your surgeon °• 2 Chewable Bariatric Specific Multivitamin / Multimineral Supplement with iron (Example: Bariatric Advantage Multi EA) °• Chewable Calcium with Vitamin D-3 °(Example: 3 Chewable Calcium Plus 600 with Vitamin D-3) °o Take 500 mg three (3) times a day for a total of 1500 mg each day °o Do not take all 3 doses of calcium at one time as it may cause constipation, and you can only absorb 500 mg  at a time  °o Do not mix multivitamins containing iron with calcium supplements; take 2 hours apart °• Menstruating women and those with a history of anemia (a blood disease that causes weakness) may need extra iron °o Talk with your doctor to see if you need more iron °• Do not stop taking or change any vitamins or minerals until you talk to your dietitian or surgeon °• Your Dietitian and/or surgeon must approve all vitamin and mineral supplements °  °Activity and Exercise: Limit your physical activity as instructed by your doctor.  It is important to continue walking at home.  During this time, use these guidelines: °• Do not lift anything greater than ten (10) pounds for at least two (2) weeks °• Do not go back to work or drive until your surgeon says you can °• You may have sex when you feel comfortable  °o It is  VERY important for female patients to use a reliable birth control method; fertility often increases after surgery  °o All hormonal birth control will be ineffective for 30 days after surgery due to medications given during surgery a barrier method must be used. °o Do not get pregnant for at least 18 months °• Start exercising as soon as your doctor tells you that you can °o Make sure your doctor approves any physical activity °• Start with a simple walking program °• Walk 5-15 minutes each day, 7 days per week.  °• Slowly increase until you are walking 30-45 minutes per day °Consider joining our BELT program. [phone] or email [email or web address] °  °Special Instructions Things to remember: °• Use your CPAP when sleeping if this applies to you ° °• [facility] has two free Bariatric Surgery Support Groups that meet monthly °o The 3rd Thursday of each month, 6 pm, [facility]  °o The 2nd Friday of each month, 11:45 am in the private dining room in the basement of [facility] °• It is very important to keep all follow up appointments with your surgeon, dietitian, primary care physician, and behavioral health practitioner °• Routine follow up schedule with your surgeon include appointments at 2-3 weeks, 6-8 weeks, 6 months, and 1 year at a minimum.  Your surgeon may request to see you more often.   °o After the first year, please follow up with your bariatric surgeon and dietitian at least once a year in order to maintain best weight loss results °[facility]: [phone] °[facility]: [phone] °Bariatric Nurse Coordinator: [phone] °  °   Reviewed and Endorsed  °by [facility] Patient Education Committee, June, 2016 °Edits Approved: Aug, 2018 ° ° ° °

## 2017-09-02 NOTE — Op Note (Signed)
Preop Diagnosis: Obesity Class III  Postop Diagnosis: same  Procedure performed: laparoscopic Roux en Y gastric bypass  Assitant: Phylliss Blakeshelsea Connor  Indications:  The patient is a 52 y.o. year-old morbidly obese female who has been followed in the Bariatric Clinic as an outpatient. This patient was diagnosed with morbid obesity with a BMI of Body mass index is 35.63 kg/m. and significant co-morbidities including hypertension, insulin dependent diabetes and GERD.  The patient was counseled extensively in the Bariatric Outpatient Clinic and after a thorough explanation of the risks and benefits of surgery (including death from complications, bowel leak, infection such as peritonitis and/or sepsis, internal hernia, bleeding, need for blood transfusion, bowel obstruction, organ failure, pulmonary embolus, deep venous thrombosis, wound infection, incisional hernia, skin breakdown, and others entailed on the consent form) and after a compliant diet and exercise program, the patient was scheduled for an elective laparoscopic gastric bypass.  Description of Operation:  Following informed consent, the patient was taken to the operating room and placed on the operating table in the supine position.  She had previously received prophylactic antibiotics and subcutaneous heparin for DVT prophylaxis in the pre-op holding area.  After induction of general endotracheal anesthesia by the anesthesiologist, the patient underwent placement of sequential compression devices, Foley catheter and an oro-gastric tube.  A timeout was confirmed by the surgery and anesthesia teams.  The patient was adequately padded at all pressure points and placed on a footboard to prevent slippage from the OR table during extremes of position during surgery.  She underwent a routine sterile prep and drape of her entire abdomen.    Next, A transverse incision was made under the left subcostal area and a 5mm optical viewing trocar was introduced  into the peritoneal cavity. Pneumoperitoneum was applied with a high flow and low pressure. A laparoscope was inserted to confirm placement. A extraperitoneal block was then placed at the lateral abdominal wall using exparel diluted with marcaine . 5 additional trocars were placed: 1 5mm trocar to the left of the midline. 1 additional 5mm trocar in the left lateral area, 1 12mm trocar in the right mid abdomen, and 1 5mm trocar in the right subcostal area.  There were multiple areas of adhesions of the omentum to the abdominal wall in the periumbilical and right upper quadrant area. The periumbilical area and midline area adhesions were dissected free with harmonic scalpel to allow mobility of the omentum and access to the stomach.  The greater omentum was flipped over the transverse colon and under the left lobe of the liver. The ligament of trietz was identified. 40cm of jejunum was measured starting from the ligament of Trietz. The mesentery was checked to ensure mobility. Next, a 60mm 2-323mm tristapler was used to divide the jejunum at this location. The harmonic scalpel was used to divide the mesentery down to the origin. A 1/2" penrose was sutured to the distal side. 100cm of jejunum was measured starting at the division. 2-0 silk was used to appose the biliary limb to the 100cm mark of jejunum in 2 places. Enterotomies were made in the biliary and common channels and a 60mm 2-3 tristapler was used to create the J-J anastomosis. A 2-0 silk was used to appose the enterotomy edges and a 600mm 2-3 tristapler was used to close the enterotomy. An anti-obstruction 2-0 silk suture was placed. Next, the mesenteric defect was closed with a 2-0 silk in running fashion.The J-J appeared patent and in neutral position.  Next, the omentum  was divided using the Harmonic scalpel. The patient was placed in steep Reverse Trendelenberg position. A Nathanson retracted was placed through a subxiphoid incision and used to  retract the liver. The fat pad over the fundus was incised to free the fundus. Next, a position along the lesser curve 6cm from GE junction was identified. The pars flaccida was entered and the fat over the lesser curve divided to enter the lesser sac. Multiple 60mm 3-63mm tristaple firings were peformed to create a 6cm pouch. The Roux limb was identified using the placed penrose and brought up to the stomach in antecolic fashion. The limb was inspected to ensure a neutral position. A 2-0 vicryl suture was then used to create a posterior layer connecting the stomach to the Roux limb jejunum in running fashion. Next cautery was used to create an enterotomy along the medial aspect of this suture line and Harmonic scalpel used to create gastotomy. A 45mm 3-65mm tristapler was then used to create a 25-48mm anastomosis. 2 2-0 vicryl sutures were used in running fashion to close the gastrotomy. Finally, a 2-0 vicryl suture was used to close an anterior layer of stomach and jejunum over the anastomosis in running fashion. The penrose was removed from the Roux limb. A 2-0 vicryl was used to appose the transverse mesocolon to the mesentery of the Roux limb.  The assistant then went and performed an upper endoscopy and leak test. No bubbles were seen and the pouch and limb distended appropriately. The limb and pouch were deflated, the endoscope was removed. Hemostasis was ensured. The 12mm trocar fascia was closed with 0 vicryl. Pneumoperitoneum was evacuated, all ports were removed and all incisions closed with 4-0 monocryl suture in subcuticular fashion. Steristrips and bandaids were put in place for dressing. The patient awoke from anesthesia and was brought to pacu in stable condition. All counts were correct.  Specimens:  None  Estimated Blood Loss: <30 ml  Local Anesthesia: 50 ml Exparel: 0.5% Marcaine Mix  Post-Op Plan:       Pain Management: PO, prn      Antibiotics: Prophylactic      Anticoagulation:  Prophylactic, Starting now      Post Op Studies/Consults: Not applicable      Intended Discharge: within 48h      Intended Outpatient Follow-Up: Two Week      Intended Outpatient Studies: Not Applicable      Other: Not Applicable   De Blanch Kelly Barber

## 2017-09-02 NOTE — H&P (Signed)
Kelly Barber is an 52 y.o. female.   Chief Complaint: obesity HPI: 52 yo female with long history of diabetes and obesity. She has now completed all requirements and is ready to proceed with bariatric surgery.  Past Medical History:  Diagnosis Date  . Anemia   . Anxiety    takes xanax daily  . Anxiety disorder   . Depression, major    takes wellbutrin and celexa daily  . Diabetes mellitus    type 2   . Fibromyalgia   . GERD (gastroesophageal reflux disease)    doesn't take any meds  . H/O hiatal hernia   . Hyperlipidemia   . Hypertension    takes Lisinopril and Diovan daily  . Hyperthyroidism    yrs ago but doesn't require meds for this  . Immune deficiency disorder (HCC)    pt states she has no clue what this is  . Insomnia   . Joint pain   . Joint swelling   . PTSD (post-traumatic stress disorder)   . PTSD (post-traumatic stress disorder)   . PVD (peripheral vascular disease) (HCC)   . Sleep apnea     Past Surgical History:  Procedure Laterality Date  . ABDOMINAL AORTAGRAM N/A 08/10/2011   Procedure: ABDOMINAL Ronny Flurry;  Surgeon: Sherren Kerns, MD;  Location: [facility] CATH LAB;  Service: Cardiovascular;  Laterality: N/A;  . AMPUTATION     lft foot toes 1.2.3  . AMPUTATION  01/07/2012   Procedure: AMPUTATION RAY;  Surgeon: Sherren Kerns, MD;  Location: [facility] OR;  Service: Vascular;  Laterality: Right;  4TH & 5TH toes  . aortogram    . CHOLECYSTECTOMY  1990  . COLONOSCOPY    . DILATION AND CURETTAGE OF UTERUS    . FEMORAL BYPASS  04/29/2008   left  . FEMORAL-POPLITEAL BYPASS GRAFT  08/27/2011   Procedure: BYPASS GRAFT FEMORAL-POPLITEAL ARTERY;  Surgeon: Sherren Kerns, MD;  Location: [facility] OR;  Service: Vascular;  Laterality: Right;  . FEMORAL-POPLITEAL BYPASS GRAFT  08/29/2011   Procedure: BYPASS GRAFT FEMORAL-POPLITEAL ARTERY;  Surgeon: Sherren Kerns, MD;  Location: [facility] OR;  Service: Vascular;  Laterality: Right;  Vein Patch Angionplasty and Thrombectomy   .  INTRAOPERATIVE ARTERIOGRAM  08/29/2011   Procedure: INTRA OPERATIVE ARTERIOGRAM;  Surgeon: Sherren Kerns, MD;  Location: [facility] OR;  Service: Vascular;  Laterality: Right;  . LOWER EXTREMITY ANGIOGRAM N/A 08/10/2011   Procedure: LOWER EXTREMITY ANGIOGRAM;  Surgeon: Sherren Kerns, MD;  Location: [facility] CATH LAB;  Service: Cardiovascular;  Laterality: N/A;  . TOE AMPUTATION  07/12/2008    left foot toes 1,2,3  . TUBAL LIGATION    . VASCULAR SURGERY      Family History  Problem Relation Age of Onset  . Diabetes Mother   . Hyperlipidemia Mother   . Hypertension Mother   . Diabetes Father   . Heart disease Father        before age 53  . Hypertension Father   . Hyperlipidemia Father   . Hypertension Sister   . Diabetes Brother    Social History:  reports that she quit smoking about 9 years ago. Her smoking use included cigarettes. She has a 10.00 pack-year smoking history. She has never used smokeless tobacco. She reports that she does not drink alcohol or use drugs.  Allergies:  Allergies  Allergen Reactions  . Amoxicillin-Pot Clavulanate Shortness Of Breath    Has patient had a PCN reaction causing immediate rash, facial/tongue/throat swelling, SOB or lightheadedness  with hypotension: Yes Has patient had a PCN reaction causing severe rash involving mucus membranes or skin necrosis: No Has patient had a PCN reaction that required hospitalization: Yes Has patient had a PCN reaction occurring within the last 10 years: No If all of the above answers are "NO", then may proceed with Cephalosporin use.   . Codeine Anaphylaxis  . Darvocet [Propoxyphene N-Acetaminophen] Anaphylaxis    Plain Tylenol ok  . Oxycodone Anaphylaxis  . Statins Other (See Comments)    Joint pain    Medications Prior to Admission  Medication Sig Dispense Refill  . ALPRAZolam (XANAX) 1 MG tablet TAKE 1 MG BY MOUTH 4 TIMES DAILY AS NEEDED FOR ANXIETY  3  . DULoxetine (CYMBALTA) 60 MG capsule Take 120 mg by mouth at  bedtime.    Marland Kitchen. ketotifen (ZADITOR) 0.025 % ophthalmic solution Place 1 drop into both eyes 2 (two) times daily as needed (allergies).    . losartan-hydrochlorothiazide (HYZAAR) 100-12.5 MG tablet Take 1 tablet by mouth every evening.   2  . NOVOLIN N 100 UNIT/ML injection Inject 18 Units into the skin 2 (two) times daily.   1  . Polyvinyl Alcohol-Povidone (REFRESH OP) Place 1 drop into both eyes 2 (two) times daily as needed (dry eyes).    . insulin aspart (NOVOLOG) 100 UNIT/ML injection Inject 10 Units into the skin once. (Patient not taking: Reported on 08/23/2017) 10 mL 0    Results for orders placed or performed during the hospital encounter of 09/02/17 (from the past 48 hour(s))  Pregnancy, urine STAT morning of surgery     Status: None   Collection Time: 09/02/17  5:37 AM  Result Value Ref Range   Preg Test, Ur NEGATIVE NEGATIVE    Comment:        THE SENSITIVITY OF THIS METHODOLOGY IS >20 mIU/mL. Performed at [facility], [address] [place], [place] [id number]   Glucose, capillary     Status: Abnormal   Collection Time: 09/02/17  5:43 AM  Result Value Ref Range   Glucose-Capillary 244 (H) 70 - 99 mg/dL   No results found.  Review of Systems  Constitutional: Negative for chills and fever.  HENT: Negative for hearing loss.   Eyes: Negative for blurred vision and double vision.  Respiratory: Negative for cough and hemoptysis.   Cardiovascular: Negative for chest pain and palpitations.  Gastrointestinal: Negative for abdominal pain, nausea and vomiting.  Genitourinary: Negative for dysuria and urgency.  Musculoskeletal: Negative for myalgias and neck pain.  Skin: Negative for itching and rash.  Neurological: Negative for dizziness, tingling and headaches.  Endo/Heme/Allergies: Does not bruise/bleed easily.  Psychiatric/Behavioral: Negative for depression and suicidal ideas.    Blood pressure 126/81, pulse 85, temperature 98.4 F (36.9 C), temperature  source Oral, resp. rate 18, height 5\' 7"  (1.702 m), weight 103.2 kg (227 lb 8 oz), SpO2 100 %. Physical Exam  Vitals reviewed. Constitutional: She is oriented to person, place, and time. She appears well-developed and well-nourished.  HENT:  Head: Normocephalic and atraumatic.  Eyes: Pupils are equal, round, and reactive to light. Conjunctivae and EOM are normal.  Neck: Normal range of motion. Neck supple.  Cardiovascular: Normal rate and regular rhythm.  Respiratory: Effort normal and breath sounds normal.  GI: Soft. Bowel sounds are normal. She exhibits no distension. There is no tenderness.  Musculoskeletal: Normal range of motion.  Neurological: She is alert and oriented to person, place, and time.  Skin: Skin is warm and  dry.  Psychiatric: She has a normal mood and affect. Her behavior is normal.     Assessment/Plan 52 yo female with class II obesity with diabetes and hypertension -lap RNY gastric bypass -Enhanced recovery protocol -bariatric protocol  Rodman Pickle, MD 09/02/2017, 7:04 AM

## 2017-09-03 ENCOUNTER — Encounter (HOSPITAL_COMMUNITY): Payer: Self-pay | Admitting: General Surgery

## 2017-09-03 LAB — CBC WITH DIFFERENTIAL/PLATELET
Basophils Absolute: 0 10*3/uL (ref 0.0–0.1)
Basophils Relative: 0 %
Eosinophils Absolute: 0 10*3/uL (ref 0.0–0.7)
Eosinophils Relative: 0 %
HCT: 38.4 % (ref 36.0–46.0)
Hemoglobin: 12.5 g/dL (ref 12.0–15.0)
Lymphocytes Relative: 22 %
Lymphs Abs: 2.2 10*3/uL (ref 0.7–4.0)
MCH: 29.8 pg (ref 26.0–34.0)
MCHC: 32.6 g/dL (ref 30.0–36.0)
MCV: 91.4 fL (ref 78.0–100.0)
Monocytes Absolute: 0.8 10*3/uL (ref 0.1–1.0)
Monocytes Relative: 8 %
Neutro Abs: 6.7 10*3/uL (ref 1.7–7.7)
Neutrophils Relative %: 70 %
Platelets: 331 10*3/uL (ref 150–400)
RBC: 4.2 MIL/uL (ref 3.87–5.11)
RDW: 12.8 % (ref 11.5–15.5)
WBC: 9.6 10*3/uL (ref 4.0–10.5)

## 2017-09-03 LAB — COMPREHENSIVE METABOLIC PANEL
ALT: 34 U/L (ref 0–44)
AST: 29 U/L (ref 15–41)
Albumin: 2.9 g/dL — ABNORMAL LOW (ref 3.5–5.0)
Alkaline Phosphatase: 87 U/L (ref 38–126)
Anion gap: 8 (ref 5–15)
BUN: 20 mg/dL (ref 6–20)
CO2: 25 mmol/L (ref 22–32)
Calcium: 8.6 mg/dL — ABNORMAL LOW (ref 8.9–10.3)
Chloride: 107 mmol/L (ref 98–111)
Creatinine, Ser: 0.75 mg/dL (ref 0.44–1.00)
GFR calc Af Amer: >60 mL/min (ref >60)
GFR calc non Af Amer: >60 mL/min (ref >60)
Glucose, Bld: 95 mg/dL (ref 70–99)
Potassium: 3.9 mmol/L (ref 3.5–5.1)
Sodium: 140 mmol/L (ref 135–145)
Total Bilirubin: 0.9 mg/dL (ref 0.3–1.2)
Total Protein: 7.2 g/dL (ref 6.5–8.1)

## 2017-09-03 LAB — GLUCOSE, CAPILLARY
Glucose-Capillary: 191 mg/dL — ABNORMAL HIGH (ref 70–99)
Glucose-Capillary: 86 mg/dL (ref 70–99)

## 2017-09-03 MED ORDER — INSULIN NPH (HUMAN) (ISOPHANE) 100 UNIT/ML ~~LOC~~ SUSP
9.0000 [IU] | Freq: Two times a day (BID) | SUBCUTANEOUS | Status: DC
  Administered 2017-09-03: 9 [IU] via SUBCUTANEOUS
  Filled 2017-09-03: qty 10

## 2017-09-03 MED ORDER — NOVOLIN N 100 UNIT/ML ~~LOC~~ SUSP: 9.0000 [IU] | Freq: Two times a day (BID) | SUBCUTANEOUS | 1 refills | Status: DC

## 2017-09-03 MED ORDER — TRAMADOL HCL 50 MG PO TABS: 50.0000 mg | ORAL_TABLET | Freq: Four times a day (QID) | ORAL | 0 refills | Status: DC | PRN

## 2017-09-03 MED ORDER — ONDANSETRON 4 MG PO TBDP: 4.0000 mg | ORAL_TABLET | Freq: Four times a day (QID) | ORAL | 0 refills | Status: DC | PRN

## 2017-09-03 MED ORDER — PANTOPRAZOLE SODIUM 40 MG PO TBEC: 40.0000 mg | DELAYED_RELEASE_TABLET | Freq: Every day | ORAL | 0 refills | Status: DC

## 2017-09-03 MED ORDER — GABAPENTIN 300 MG PO CAPS: 300.0000 mg | ORAL_CAPSULE | Freq: Three times a day (TID) | ORAL | 0 refills | Status: DC

## 2017-09-03 NOTE — Progress Notes (Addendum)
Inpatient Diabetes Program Recommendations  AACE/ADA: New Consensus Statement on Inpatient Glycemic Control (2015)  Target Ranges:  Prepandial:   less than 140 mg/dL      Peak postprandial:   less than 180 mg/dL (1-2 hours)      Critically ill patients:  140 - 180 mg/dL   Results for ELLINA, SIVERTSEN (MRN [id number]) as of 09/03/2017 10:42  Ref. Range 09/02/2017 05:43 09/02/2017 09:50 09/02/2017 17:20 09/02/2017 21:55  Glucose-Capillary Latest Ref Range: 70 - 99 mg/dL 244 (H) 275 (H) 207 (H) 118 (H)   Results for MYRA, WENG (MRN [id number]) as of 09/03/2017 10:42  Ref. Range 09/03/2017 07:26  Glucose-Capillary Latest Ref Range: 70 - 99 mg/dL 191 (H)     Admit Gastric Bypass Surgery.   History: DM  Home DM Meds: NPH Insulin 18 units BID  Current Orders: NPH Insulin- 9 units BID      Novolog Resistant Correction Scale/ SSI (0-20 units) TID AC + HS      Met with patient this AM to discuss insulin plans for home.  Explained to pt that Dr. Kieth Brightly restarted her NPH insulin at 50% total home dose this AM (9 units BID).  Encouraged pt to check her CBGs at least TID AC at home and to keep track of her CBGs.  Explained to pt that her need for insulin may greatly reduce now that she has had surgery.  Patient stated she understood this information, and plans to follow up with her PCP (Dr. Gwenyth Bouillon) soon after discharge.  Reviewed signs and symptoms of Hypoglycemia and how to treat HYPO at home.  Also encouraged pt to call her PCP if she has consistent CBG elevations at home as well.     --Will follow patient during hospitalization--  Wyn Quaker RN, MSN, CDE Diabetes Coordinator Inpatient Glycemic Control Team Team Pager: [phone] (8a-5p)

## 2017-09-03 NOTE — Discharge Summary (Signed)
Physician Discharge Summary  Kelly Barber [id number] DOB: [birth year] DOA: 09/02/2017  PCP: Kelly Barber  Admit date: 09/02/2017 Discharge date: 09/03/2017  Recommendations for Outpatient Follow-up:  1.  (include homehealth, outpatient follow-up instructions, specific recommendations for PCP to follow-up on, etc.)  Follow-up Information    Helix Lafontaine, De BlanchLuke Aaron, MD. Go on 09/16/2017.   Specialty:  General Surgery Why:  at 215 pm Contact information: [address] [place] [address] [phone]        Faelyn Sigler, De BlanchLuke Aaron, MD .   Specialty:  General Surgery Contact information: [address] [place] [address] [phone]          Discharge Diagnoses:  Active Problems:   Morbid obesity [facility](HCC)   Surgical Procedure: Laparoscopic Sleeve Gastrectomy, upper endoscopy  Discharge Condition: Good Disposition: Home  Diet recommendation: Postoperative sleeve gastrectomy diet (liquids only)  Filed Weights   09/02/17 0552 09/03/17 0536  Weight: 103.2 kg (227 lb 8 oz) 104.4 kg (230 lb 2.6 oz)     Hospital Course:  The patient was admitted after undergoing Roux-en-Y gastric bypass. POD 0 she ambulated well. POD 1 she was started on the water diet protocol and tolerated 250 ml in the first shift. Once meeting the water amount she was advanced to bariatric protein shakes which they tolerated and were discharged home POD 1.  Treatments: surgery: Roux-en-Y gastric bypass  Discharge Instructions  Discharge Instructions    Ambulate hourly while awake   Complete by:  As directed    Call MD for:  difficulty breathing, headache or visual disturbances   Complete by:  As directed    Call MD for:  persistant dizziness or light-headedness   Complete by:  As directed    Call MD for:  persistant nausea and vomiting   Complete by:  As directed    Call MD for:  redness, tenderness, or signs of infection (pain, swelling, redness, odor or  green/yellow discharge around incision site)   Complete by:  As directed    Call MD for:  severe uncontrolled pain   Complete by:  As directed    Call MD for:  temperature >101 F   Complete by:  As directed    Diet bariatric full liquid   Complete by:  As directed    Discharge wound care:   Complete by:  As directed    Remove Bandaids tomorrow, ok to shower tomorrow. Steristrips may fall off in 1-3 weeks.   Incentive spirometry   Complete by:  As directed    Perform hourly while awake     Allergies as of 09/03/2017      Reactions   Amoxicillin-pot Clavulanate Shortness Of Breath   Has patient had a PCN reaction causing immediate rash, facial/tongue/throat swelling, SOB or lightheadedness with hypotension: Yes Has patient had a PCN reaction causing severe rash involving mucus membranes or skin necrosis: No Has patient had a PCN reaction that required hospitalization: Yes Has patient had a PCN reaction occurring within the last 10 years: No If all of the above answers are "NO", then may proceed with Cephalosporin use.   Codeine Anaphylaxis   Darvocet [propoxyphene N-acetaminophen] Anaphylaxis   Plain Tylenol ok   Oxycodone Anaphylaxis   Statins Other (See Comments)   Joint pain      Medication List    STOP taking these medications   insulin aspart 100 UNIT/ML injection Commonly known as:  novoLOG     TAKE  these medications   ALPRAZolam 1 MG tablet Commonly known as:  XANAX TAKE 1 MG BY MOUTH 4 TIMES DAILY AS NEEDED FOR ANXIETY   DULoxetine 60 MG capsule Commonly known as:  CYMBALTA Take 120 mg by mouth at bedtime.   gabapentin 300 MG capsule Commonly known as:  NEURONTIN Take 1 capsule (300 mg total) by mouth every 8 (eight) hours. for 30 days   ketotifen 0.025 % ophthalmic solution Commonly known as:  ZADITOR Place 1 drop into both eyes 2 (two) times daily as needed (allergies).   losartan-hydrochlorothiazide 100-12.5 MG tablet Commonly known as:  HYZAAR Take 1  tablet by mouth every evening. Notes to patient:  Monitor Blood Pressure Daily and keep a log for primary care physician.  You may need to make changes to your medications with rapid weight loss.     NOVOLIN N 100 UNIT/ML injection Generic drug:  insulin NPH Human Inject 0.09 mLs (9 Units total) into the skin 2 (two) times daily. What changed:  how much to take   ondansetron 4 MG disintegrating tablet Commonly known as:  ZOFRAN-ODT Take 1 tablet (4 mg total) by mouth every 6 (six) hours as needed for nausea or vomiting.   pantoprazole 40 MG tablet Commonly known as:  PROTONIX Take 1 tablet (40 mg total) by mouth daily.   REFRESH OP Place 1 drop into both eyes 2 (two) times daily as needed (dry eyes).   traMADol 50 MG tablet Commonly known as:  ULTRAM Take 1 tablet (50 mg total) by mouth every 6 (six) hours as needed (pain).            Discharge Care Instructions  (From admission, onward)        Start     Ordered   09/03/17 0000  Discharge wound care:    Comments:  Remove Bandaids tomorrow, ok to shower tomorrow. Steristrips may fall off in 1-3 weeks.   09/03/17 4098     Follow-up Information    Orlando Thalmann, De Blanch, MD. Go on 09/16/2017.   Specialty:  General Surgery Why:  at 215 pm Contact information: [address] [place] [address] [phone]        Evann Erazo, De Blanch, MD .   Specialty:  General Surgery Contact information: [address] [place] [address] [phone]            The results of significant diagnostics from this hospitalization (including imaging, microbiology, ancillary and laboratory) are listed below for reference.    Significant Diagnostic Studies: No results found.  Labs: Basic Metabolic Panel: Recent Labs  Lab 08/28/17 0900 09/03/17 0523  NA 135 140  K 4.4 3.9  CL 100 107  CO2 28 25  GLUCOSE 368* 95  BUN 33* 20  CREATININE 1.05* 0.75  CALCIUM 9.0 8.6*   Liver Function  Tests: Recent Labs  Lab 08/28/17 0900 09/03/17 0523  AST 19 29  ALT 21 34  ALKPHOS 109 87  BILITOT 1.2 0.9  PROT 8.2* 7.2  ALBUMIN 3.5 2.9*    CBC: Recent Labs  Lab 08/28/17 0900 09/02/17 1038 09/03/17 0523  WBC 7.3  --  9.6  NEUTROABS 4.0  --  6.7  HGB 13.5 12.6 12.5  HCT 40.8 37.9 38.4  MCV 91.1  --  91.4  PLT 332  --  331    CBG: Recent Labs  Lab 09/02/17 0543 09/02/17 0950 09/02/17 1720 09/02/17 2155 09/03/17 0726  GLUCAP 244* 275* 207*  118* 191*    Active Problems:   Morbid obesity [facility])   [facility] Bariatric VTE risk calculator : 0.16% risk of post operative VTE no chemical prophylaxis recommended ([email or web address])  Time coordinating discharge: 

## 2017-09-03 NOTE — Progress Notes (Signed)
Discharge instructions given to pt and all questions were answered.  

## 2017-09-03 NOTE — Progress Notes (Signed)
Patient alert and oriented, pain is controlled. Patient is tolerating fluids, advanced to protein shake today, patient is tolerating well. Reviewed Gastric Bypass discharge instructions with patient and patient is able to articulate understanding. Provided information on BELT program, Support Group and [facility] outpatient pharmacy. All questions answered, will continue to monitor.   Total fluid intake 787 Per dehydration protocol call one week post op

## 2017-09-03 NOTE — Progress Notes (Signed)
Patient alert and oriented, Post op day 1.  Provided support and encouragement.  Encouraged pulmonary toilet, ambulation and small sips of liquids.  Completed 12o ounces of bari clear fluid and protein shake overnight.  All questions answered.  Will continue to monitor.

## 2017-09-09 ENCOUNTER — Telehealth (HOSPITAL_COMMUNITY): Payer: Self-pay

## 2017-09-09 NOTE — Telephone Encounter (Signed)
Patient called to discuss post bariatric surgery follow up questions.  See below:   1.  Tell me about your pain and pain management?having a small amt of pain but decreased with medication  2.  Let's talk about fluid intake.  How much total fluid are you taking in?46 ounces of fluid- patient was following 30-15-30 rule.  We discussed to drink fluids continuously and the rule would come in to play during phase III diet  3.  How much protein have you taken in the last 2 days?60 grams of protein  4.  Have you had nausea?  Tell me about when have experienced nausea and what you did to help?no nausea  5.  Has the frequency or color changed with your urine?urinating frequently  6.  Tell me what your incisions look like?no problems  7.  Have you been passing gas? BM?passing gas had bm some constipation concerns.  Directed to review dc handout and utilize either miralax or MOM as directed.  8.  If a problem or question were to arise who would you call?  Do you know contact numbers for [facility], [facility], and [facility]?aware of how to contact all resources  9.  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  How are your vitamins and calcium going?  How are you taking them?Taking celebrate MVI 2x's per day and calcium 3 x's per day  Saw PCP last Thursday, taken off diuretic/bp combo med per patient only on bp medication.  Has not been taking insulin states her blood sugars have been in 70's.  We discussed what to do should a blood sugar be below 70 and encouraged patient to contact PCP to make them aware that Insulin has been discontinued by the patient.

## 2017-09-17 ENCOUNTER — Encounter: Payer: BLUE CROSS/BLUE SHIELD | Attending: General Surgery | Admitting: Skilled Nursing Facility1

## 2017-09-17 ENCOUNTER — Encounter: Payer: Self-pay | Admitting: Skilled Nursing Facility1

## 2017-09-17 DIAGNOSIS — Z6838 Body mass index (BMI) 38.0-38.9, adult: Secondary | ICD-10-CM | POA: Diagnosis not present

## 2017-09-17 DIAGNOSIS — E119 Type 2 diabetes mellitus without complications: Secondary | ICD-10-CM

## 2017-09-17 DIAGNOSIS — Z713 Dietary counseling and surveillance: Secondary | ICD-10-CM | POA: Diagnosis not present

## 2017-09-17 NOTE — Progress Notes (Signed)
Bariatric Class:  Appt start time: 1530 end time:  1630.  2 Week Post-Operative Nutrition Class  Patient was seen on 09/17/2017 for Post-[facility]ative [facility]   Pt admits to crushing up cheetoes and chips and then getting very sick resulting in emesis. Pt states she has a compulsive shopping addiction leading to waking from sleep to purchase items and getting her tongue pierced. Pt states she was realizing she was eating to deal with emotions so she started sleeping instead throughout the day. Dietitian advised she continue to work with her mental health professional on these topics.    Surgery date: 09/02/2017 Surgery type: RYGB Start weight at [facility]: 241.3 Weight today: 229.6  TANITA  BODY COMP RESULTS  09/17/2017   BMI (kg/m^2) 33.7   Fat Mass (lbs) 101.4   Fat Free Mass (lbs) 113.6   Total Body Water (lbs) 82   The following the learning objectives were met by the patient during this course:  Identifies Phase 3A (Soft, High Proteins) Dietary Goals and will begin from 2 weeks post-operatively to 2 months post-operatively  Identifies appropriate sources of fluids and proteins   States protein recommendations and appropriate sources post-operatively  Identifies the need for appropriate texture modifications, mastication, and bite sizes when consuming solids  Identifies appropriate multivitamin and calcium sources post-operatively  Describes the need for physical activity post-operatively and will follow MD recommendations  States when to call healthcare provider regarding medication questions or post-operative complications  Handouts given during class include:  Phase 3A: Soft, High Protein Diet Handout  Follow-Up Plan: Patient will follow-up at [facility] in 6 weeks for 2 month post-op nutrition visit for diet advancement per MD.

## 2017-09-23 ENCOUNTER — Telehealth: Payer: Self-pay | Admitting: Registered"

## 2017-09-23 NOTE — Telephone Encounter (Signed)
RD called pt to verify fluid intake once starting soft, solid proteins 2 week post-bariatric surgery. Pt unavailable. RD left voicemail.    

## 2017-10-14 ENCOUNTER — Other Ambulatory Visit: Payer: Self-pay | Admitting: General Surgery

## 2017-10-16 ENCOUNTER — Ambulatory Visit: Payer: BLUE CROSS/BLUE SHIELD | Admitting: Podiatry

## 2017-10-16 ENCOUNTER — Ambulatory Visit (HOSPITAL_COMMUNITY)
Admission: RE | Admit: 2017-10-16 | Discharge: 2017-10-16 | Disposition: A | Discharge status: Home / Self Care | Payer: BLUE CROSS/BLUE SHIELD | Source: Ambulatory Visit | Attending: General Surgery | Admitting: General Surgery

## 2017-10-16 DIAGNOSIS — E860 Dehydration: Secondary | ICD-10-CM | POA: Insufficient documentation

## 2017-10-16 LAB — COMPREHENSIVE METABOLIC PANEL
ALT: 22 U/L (ref 0–44)
AST: 19 U/L (ref 15–41)
Albumin: 3.3 g/dL — ABNORMAL LOW (ref 3.5–5.0)
Alkaline Phosphatase: 78 U/L (ref 38–126)
Anion gap: 11 (ref 5–15)
BUN: 30 mg/dL — ABNORMAL HIGH (ref 6–20)
CO2: 24 mmol/L (ref 22–32)
Calcium: 8.3 mg/dL — ABNORMAL LOW (ref 8.9–10.3)
Chloride: 108 mmol/L (ref 98–111)
Creatinine, Ser: 0.83 mg/dL (ref 0.44–1.00)
GFR calc Af Amer: >60 mL/min (ref >60)
GFR calc non Af Amer: >60 mL/min (ref >60)
Glucose, Bld: 117 mg/dL — ABNORMAL HIGH (ref 70–99)
Potassium: 3 mmol/L — ABNORMAL LOW (ref 3.5–5.1)
Sodium: 143 mmol/L (ref 135–145)
Total Bilirubin: 1.1 mg/dL (ref 0.3–1.2)
Total Protein: 7.2 g/dL (ref 6.5–8.1)

## 2017-10-16 MED ORDER — SODIUM CHLORIDE 0.9 % IV BOLUS
2000.0000 mL | Freq: Once | INTRAVENOUS | Status: AC
  Administered 2017-10-16: 2000 mL via INTRAVENOUS

## 2017-10-16 MED ORDER — ONDANSETRON HCL 4 MG/2ML IJ SOLN: 4.0000 mg | INTRAMUSCULAR | Status: DC | PRN

## 2017-10-16 MED ORDER — SODIUM CHLORIDE 0.9 % IV SOLN: INTRAVENOUS | Status: DC

## 2017-10-16 MED ORDER — ONDANSETRON 4 MG PO TBDP: 4.0000 mg | ORAL_TABLET | ORAL | Status: DC | PRN

## 2017-10-16 NOTE — Progress Notes (Addendum)
                              Patient Care Center Note   Diagnosis:Dehydration  Provider: Dr. Sheliah HatchKinsinger  Procedure: 2 L normal saline bolus   Note: [name] came into the Patient Care Center, received 2 L normal saline bolus over 2 hours via PIV. Patient blood pressure was taken pre and post with a significant improvement. Patient stated she felt better. Patient received discharge instructions and verbalized understanding. Patient alert, orientated and ambulatory at time of discharge.   Per order labs drawn.  Alonza BogusMelvina Tamara Monteith, RN

## 2017-10-16 NOTE — Progress Notes (Signed)
Questioned orders of bolus administration and lab orders. Dr. Sheliah HatchKinsinger office notified, received call back from Dr. Sheliah HatchKinsinger, verbal orders to administered 2 L over two hour and draw CMP at any time.

## 2017-10-16 NOTE — Discharge Instructions (Signed)
Received 2 L of normal saline for dehydration.

## 2017-10-28 ENCOUNTER — Ambulatory Visit: Payer: BLUE CROSS/BLUE SHIELD | Admitting: Skilled Nursing Facility1

## 2017-10-31 ENCOUNTER — Encounter: Payer: Self-pay | Admitting: Skilled Nursing Facility1

## 2017-10-31 ENCOUNTER — Encounter: Payer: BLUE CROSS/BLUE SHIELD | Attending: General Surgery | Admitting: Skilled Nursing Facility1

## 2017-10-31 DIAGNOSIS — Z6838 Body mass index (BMI) 38.0-38.9, adult: Secondary | ICD-10-CM | POA: Diagnosis not present

## 2017-10-31 DIAGNOSIS — Z713 Dietary counseling and surveillance: Secondary | ICD-10-CM | POA: Diagnosis present

## 2017-10-31 DIAGNOSIS — E669 Obesity, unspecified: Secondary | ICD-10-CM

## 2017-10-31 NOTE — Progress Notes (Signed)
Follow-up visit:  8 Weeks Post-Operative RYGB Surgery  Primary concerns today: Post-operative Bariatric Surgery Nutrition Management  Pt needed to go to the rehydration clinic. Pt states she is feeling her energy coming back. Pt waved her hand to have dietitian Stop talking so she could take a facetime call. Pt states sometimes she cannot tolerate enough food so she drinks 1 protein shake. Pt states when she eats meat she feels nauseous; but then upon further digging the pt has not had any reaction to any meats she is just afraid she will react so she does not eat anything. Pt states she can tolerate beans and cheese as well as baked fish and chicken. Pt states she has overeaten and now she is afraid to eat. Pt states she is worried about failing meaning gaining weight. Pt states she has been working with her therapist. Pt staets she got dehydrated because she just laid in bed all day due to post partum depression she feels as though she broke up with a husband;pt states food is giving her anxiety. Pt states she has been craving bread and vegetables. Pt is extremely emotional and had multiple ups and downs in a 10 minute period. Dietitian attempted to assuaged her to give her what she needs.  Pt was given several meal ideas and recipes. Dietitian offered for her next appt to be with Mirian Capuchinonetta Floyd, RD but pt states NO! I will just be mad at her too.   Surgery date: 09/02/2017 Surgery type: RYGB [facility] Weight today: 202.8 Wt change: 26.8  TANITA  BODY COMP RESULTS  09/17/2017 10/31/2017   BMI (kg/m^2) 33.7 31.8   Fat Mass (lbs) 101.4 84   Fat Free Mass (lbs) 113.6 118.8   Total Body Water (lbs) 82 85   24-hr recall: eating 3 times a day only 2 days a week; pt is a poor historian  B (AM): unsweet applesauce  Snk (AM):   L (PM): 1 2 oz string cheese Snk (PM):  D (PM): 2 deli slices  Snk (PM):   Fluid intake: 84 ounces: 32 oz of plain water then flavored with packets and  diet green tea Estimated total protein intake: 30  Medications: sliding scale for insulin and no longer on blood pressure medication  Supplementation: celebrate, calcium and iron  CBG monitoring: 90-180 Average CBG per patient: 180 Last patient reported A1c:   Using straws: no Drinking while eating: no Having you been chewing well:no Chewing/swallowing difficulties: no Changes in vision: no Changes to mood/headaches: no Hair loss/Cahnges to skin/Changes to nails: no Any difficulty focusing or concentrating: no Sweating: no Dizziness/Lightheaded: no Palpitations: no  Carbonated beverages: no N/V/D/C/GAS: no Abdominal Pain: no Dumping syndrome: no  Recent physical activity:  Peddling under desk 2 times a day 30 minutes a day,   Progress Towards Goal(s):  In progress.  Handouts given during visit include:  Protein options    Nutritional Diagnosis:  [place]-3.3 Overweight/obesity related to past poor dietary habits and physical inactivity as evidenced by patient w/ recent RYGB surgery following dietary guidelines for continued weight loss.  Intervention:  Nutrition cousneling. Dietitian worked with pt on meeting her dietary needs.  Teaching Method Utilized: Visual Auditory Hands on  Barriers to learning/adherence to lifestyle change: anxiety/depression  Demonstrated degree of understanding via:  Teach Back   Monitoring/Evaluation:  Dietary intake, exercise, and body weight. Follow up in 2 weeks

## 2017-11-13 ENCOUNTER — Ambulatory Visit: Payer: BLUE CROSS/BLUE SHIELD | Admitting: Podiatry

## 2017-11-13 DIAGNOSIS — E0843 Diabetes mellitus due to underlying condition with diabetic autonomic (poly)neuropathy: Secondary | ICD-10-CM | POA: Diagnosis not present

## 2017-11-13 DIAGNOSIS — B351 Tinea unguium: Secondary | ICD-10-CM

## 2017-11-13 DIAGNOSIS — I739 Peripheral vascular disease, unspecified: Secondary | ICD-10-CM | POA: Diagnosis not present

## 2017-11-13 DIAGNOSIS — M79676 Pain in unspecified toe(s): Secondary | ICD-10-CM

## 2017-11-18 ENCOUNTER — Encounter: Payer: BLUE CROSS/BLUE SHIELD | Attending: General Surgery | Admitting: Skilled Nursing Facility1

## 2017-11-18 ENCOUNTER — Encounter: Payer: Self-pay | Admitting: Skilled Nursing Facility1

## 2017-11-18 DIAGNOSIS — Z6838 Body mass index (BMI) 38.0-38.9, adult: Secondary | ICD-10-CM | POA: Insufficient documentation

## 2017-11-18 DIAGNOSIS — Z713 Dietary counseling and surveillance: Secondary | ICD-10-CM | POA: Insufficient documentation

## 2017-11-18 DIAGNOSIS — E119 Type 2 diabetes mellitus without complications: Secondary | ICD-10-CM

## 2017-11-18 NOTE — Progress Notes (Signed)
Follow-up visit:  8 Weeks Post-Operative RYGB Surgery  Primary concerns today: Post-operative Bariatric Surgery Nutrition Management  Pt arrived visibly irritated with minimal eye contact and short abrupt sentences.  Pt states she buys the eggs already peeled and cooked and "does what works for her". Pt states if she gets a "head hunger" she drinks diet green tea. Pt states she has more energy now. Pt states she is going to start water aerobics. Pt states she does not want to drink any protein shakes and just will keep on with the restrictive diet. Pt states she hates having to pay attention to her food.  Pt admits to psychologically thinking eating more often throughout the day will cause more weight gain.Pt states she is very afraid of overeating so she under eats. Pt states a lot of people have something to say about how she eats which irritates her. Pt states she is realizing she should not look at the scale. Pt states with her shopping addiction she bought an air fryer and pressure cooker.    Surgery date: 09/02/2017 Surgery type: RYGB [facility]: 241.3 Weight today: 194.8 Wt change: 8 pounds  TANITA  BODY COMP RESULTS  09/17/2017 10/31/2017 11/18/2017   BMI (kg/m^2) 33.7 31.8 30.5   Fat Mass (lbs) 101.4 84 78.6   Fat Free Mass (lbs) 113.6 118.8 116.2   Total Body Water (lbs) 82 85 83   24-hr recall: 7 days a week pt is a poor historian  B (7:30-8:30AM): boiled egg and one [place]key sausage Snk (AM):   L (12:15PM): wendys chili Snk (PM):  D (4-6PM): chili Snk (PM):   Fluid intake: 84 ounces: 32 oz of plain water then flavored with packets and diet green tea Estimated total protein intake: 30  Medications: sliding scale for insulin and no longer on blood pressure medication  Supplementation: celebrate, calcium and iron  CBG monitoring: 90-150 Average CBG per patient: 180 Last patient reported A1c:   Using straws: no Drinking while eating: no Having you been chewing  well:no Chewing/swallowing difficulties: no Changes in vision: no Changes to mood/headaches: no Hair loss/Cahnges to skin/Changes to nails: no Any difficulty focusing or concentrating: no Sweating: no Dizziness/Lightheaded: no Palpitations: no  Carbonated beverages: no N/V/D/C/GAS: no Abdominal Pain: no Dumping syndrome: no  Recent physical activity:  Peddling under desk 2 times a day 30 minutes a day,   Progress Towards Goal(s):  In progress.  Handouts given during visit include:  Protein options    Nutritional Diagnosis:  [place]-3.3 Overweight/obesity related to past poor dietary habits and physical inactivity as evidenced by patient w/ recent RYGB surgery following dietary guidelines for continued weight loss.  Intervention:  Nutrition cousneling. Dietitian worked with pt on meeting her dietary needs. Goals: -Eat your 3 ounces of protein first then start in in your vegetables: you might only get a bite or 2 into your vegetables when you feel satisfied (per meal-3 meals a day) -Today and tomorrow eat soft cooked vegetables and if tolerated move onto raw or cooked    Teaching Method Utilized: Visual Auditory Hands on  Barriers to learning/adherence to lifestyle change: anxiety/depression  Demonstrated degree of understanding via:  Teach Back   Monitoring/Evaluation:  Dietary intake, exercise, and body weight. Follow up in 2 weeks

## 2017-11-18 NOTE — Patient Instructions (Addendum)
-  Eat your 3 ounces of protein first then start in in your vegetables: you might only get a bite or 2 into your vegetables when you feel satisfied (per meal-3 meals a day)  -Today and tomorrow eat soft cooked vegetables and if tolerated move onto raw or cooked

## 2017-11-20 NOTE — Progress Notes (Signed)
Visit  SUBJECTIVE Patient with a history of type 2 diabetes mellitus presents to office today complaining of elongated, thickened nails that cause pain while ambulating in shoes. She is unable to trim her own nails. Patient is here for further evaluation and treatment.   Past Medical History:  Diagnosis Date  . Anemia   . Anxiety    takes xanax daily  . Anxiety disorder   . Depression, major    takes wellbutrin and celexa daily  . Diabetes mellitus    type 2   . Fibromyalgia   . GERD (gastroesophageal reflux disease)    doesn't take any meds  . H/O hiatal hernia   . Hyperlipidemia   . Hypertension    takes Lisinopril and Diovan daily  . Hyperthyroidism    yrs ago but doesn't require meds for this  . Immune deficiency disorder (HCC)    pt states she has no clue what this is  . Insomnia   . Joint pain   . Joint swelling   . PTSD (post-traumatic stress disorder)   . PTSD (post-traumatic stress disorder)   . PVD (peripheral vascular disease) (HCC)   . Sleep apnea     OBJECTIVE General Patient is awake, alert, and oriented x 3 and in no acute distress. Derm Skin is dry and supple bilateral. Negative open lesions or macerations. Remaining integument unremarkable. Nails are tender, long, thickened and dystrophic with subungual debris, consistent with onychomycosis, digits 4-5 left and 1-3 right. No signs of infection noted. Vasc  DP and PT pedal pulses palpable bilaterally. Temperature gradient within normal limits.  Neuro Epicritic and protective threshold sensation diminished bilaterally.  Musculoskeletal Exam No symptomatic pedal deformities noted bilateral. Muscular strength within normal limits.  ASSESSMENT 1. Diabetes Mellitus w/ peripheral neuropathy 2. Onychomycosis of nail due to dermatophyte bilateral 3. Pain in foot bilateral 4. H/o toe amputation 1-3 left 5. H/o toe amputation 4-5 right  6. PVD BLE  PLAN OF CARE 1. Patient evaluated today. 2. Instructed to  maintain good pedal hygiene and foot care. Stressed importance of controlling blood sugar.  3. Mechanical debridement of nails 4-5 left, 1-3 right performed using a nail nipper. Filed with dremel without incident.  4. Return to clinic in 3 mos.   Recently had bariatric surgery and lost 45 pounds.    Felecia ShellingBrent M. Jani Ploeger, DPM [facility]  Dr. Felecia ShellingBrent M. Amelia Burgard, DPM    [address]                                        [place], [place] [address]                Office [phone]  Fax [phone]

## 2017-12-03 ENCOUNTER — Encounter: Payer: Self-pay | Admitting: Skilled Nursing Facility1

## 2017-12-03 ENCOUNTER — Encounter: Payer: BLUE CROSS/BLUE SHIELD | Attending: General Surgery | Admitting: Skilled Nursing Facility1

## 2017-12-03 DIAGNOSIS — E669 Obesity, unspecified: Secondary | ICD-10-CM

## 2017-12-03 DIAGNOSIS — Z6838 Body mass index (BMI) 38.0-38.9, adult: Secondary | ICD-10-CM | POA: Insufficient documentation

## 2017-12-03 DIAGNOSIS — Z713 Dietary counseling and surveillance: Secondary | ICD-10-CM | POA: Insufficient documentation

## 2017-12-03 NOTE — Progress Notes (Signed)
Follow-up visit:  8 Weeks Post-Operative RYGB Surgery  Primary concerns today: Post-operative Bariatric Surgery Nutrition Management  Pt answered her phone during the appointment. Pt state she realized now that not eating every 2-3 hours she gets low blood sugar (81). Pt states she experienced nausea from having glazed chicken from a restaurant. Pt states he realized cauliflower product have a lot of salt so she stays away from it. Pt states when she eats raw vegetables: cucumbers with vinegar the next day dry heaving (the cucumbers were eaten about 20 hours before she felt like it caused an issue). Pt states she tries to eat protein before vegetables. Pt states she bought protein water to drink every now and then. Pt state she is realizing processed foods should not be her go tos.  Pt states she needs to come every 2 weeks for accountability.    Surgery date: 09/02/2017 Surgery type: RYGB Start weight at [facility]: 241.3 Weight today: 191.6 Wt change: 3 pounds  TANITA  BODY COMP RESULTS  09/17/2017 10/31/2017 11/18/2017 12/03/2017   BMI (kg/m^2) 33.7 31.8 30.5 30   Fat Mass (lbs) 101.4 84 78.6 75.2   Fat Free Mass (lbs) 113.6 118.8 116.2 116.4   Total Body Water (lbs) 82 85 83 63   24-hr recall: 7 days a week 4 food events; pt is a poor historian; eating meals out of the house  B (7:30-8:30AM): boiled egg and one [place] sausage Snk (10AM):  100 calorie pack of peanuts L (12:15PM): wendys chili or boiled eggs Snk (PM): 100 calorie of peanuts  D (4-6PM): chili or chicken 3 ounces and pintos Snk (PM): vienna sausage   Fluid intake: 84 ounces: 32 oz of plain water then flavored with packets and diet green tea Estimated total protein intake: 60  Medications: sliding scale for insulin and no longer on blood pressure medication  Supplementation: celebrate, calcium and iron  CBG monitoring: 90-150 Average CBG per patient: 180 Last patient reported A1c: 8.9  Using straws: no Drinking while  eating: no Having you been chewing well:no Chewing/swallowing difficulties: no Changes in vision: no Changes to mood/headaches: no Hair loss/Cahnges to skin/Changes to nails: no Any difficulty focusing or concentrating: no Sweating: no Dizziness/Lightheaded: no Palpitations: no  Carbonated beverages: no N/V/D/C/GAS: no Abdominal Pain: no Dumping syndrome: no  Recent physical activity:  ADL's due to depression   Progress Towards Goal(s):  In progress.  Handouts given during visit include:  Protein options    Nutritional Diagnosis:  [place]-3.3 Overweight/obesity related to past poor dietary habits and physical inactivity as evidenced by patient w/ recent RYGB surgery following dietary guidelines for continued weight loss.  Intervention:  Nutrition cousneling. Dietitian worked with pt on meeting her dietary needs. Goals: -Eat your 3 ounces of protein first then start in in your vegetables: you might only get a bite or 2 into your vegetables when you feel satisfied (per meal-3 meals a day) -Today and tomorrow eat soft cooked vegetables and if tolerated move onto raw or cooked   -Get on the treadmill   Teaching Method Utilized: Visual Auditory Hands on  Barriers to learning/adherence to lifestyle change: anxiety/depression  Demonstrated degree of understanding via:  Teach Back   Monitoring/Evaluation:  Dietary intake, exercise, and body weight. Follow up in 2 weeks

## 2017-12-17 ENCOUNTER — Encounter: Payer: Self-pay | Admitting: Nurse Practitioner

## 2017-12-23 ENCOUNTER — Encounter: Payer: Self-pay | Admitting: Skilled Nursing Facility1

## 2017-12-23 ENCOUNTER — Encounter: Payer: BLUE CROSS/BLUE SHIELD | Admitting: Skilled Nursing Facility1

## 2017-12-23 DIAGNOSIS — Z713 Dietary counseling and surveillance: Secondary | ICD-10-CM | POA: Diagnosis not present

## 2017-12-23 DIAGNOSIS — E669 Obesity, unspecified: Secondary | ICD-10-CM

## 2017-12-23 NOTE — Progress Notes (Signed)
Follow-up visit:  8 Weeks Post-Operative RYGB Surgery  Primary concerns today: Post-operative Bariatric Surgery Nutrition Management Pt states she had a low blood sugar (73) on the treadmill and realized she needs to have a snack before getting on the treadmill. Pt states she has been feeling good and waiting for her antidepressants to kick in. Pt states she uses her 50 ounce bottle to ensure she meets her fluid needs. Pt states if she has a bad day she has to reset to liquid only diet. Pt states when she eats her protein she does not have any room for vegetables. Pt states he feels cheese causes her constipation.    Surgery date: 09/02/2017 Surgery type: RYGB Start weight at [facility]: 241.3 Weight today: 189.8 Wt change: 1 pound  TANITA  BODY COMP RESULTS  09/17/2017 10/31/2017 11/18/2017 12/03/2017 12/23/2017   BMI (kg/m^2) 33.7 31.8 30.5 30 29.7   Fat Mass (lbs) 101.4 84 78.6 75.2 71.2   Fat Free Mass (lbs) 113.6 118.8 116.2 116.4 118.6   Total Body Water (lbs) 82 85 83 83 84.6   24-hr recall: 7 days a week 4 food events; pt is a poor historian; eating meals out of the house  B (7:30-8:30AM): boiled egg and one [place] sausage Snk (10AM):  100 calorie pack of peanuts L (12:15PM): wendys chili or boiled eggs Snk (PM): 100 calorie of peanuts  D (4-6PM): chili or chicken 3 ounces and pintos Snk (PM): vienna sausage   Fluid intake: 100 ounces: 32 oz of plain water then flavored with packets and diet green tea Estimated total protein intake: 60  Medications: sliding scale for insulin and no longer on blood pressure medication  Supplementation: celebrate, calcium and iron  CBG monitoring: 90-150 Average CBG per patient: 180 Last patient reported A1c: 8.9  Using straws: no Drinking while eating: no Having you been chewing well:no Chewing/swallowing difficulties: no Changes in vision: no Changes to mood/headaches: no Hair loss/Cahnges to skin/Changes to nails: no Any difficulty  focusing or concentrating: no Sweating: no Dizziness/Lightheaded: no Palpitations: no  Carbonated beverages: no N/V/D/C/GAS: no Abdominal Pain: no Dumping syndrome: no  Recent physical activity:  Some walking  Progress Towards Goal(s):  In progress.   Nutritional Diagnosis:  [place]-3.3 Overweight/obesity related to past poor dietary habits and physical inactivity as evidenced by patient w/ recent RYGB surgery following dietary guidelines for continued weight loss.  Intervention:  Nutrition cousneling. Dietitian worked with pt on meeting her dietary needs. Goals: -Switch the capsule multivitamin  -Try eating vegetables for snacks  Teaching Method Utilized: Visual Auditory Hands on  Barriers to learning/adherence to lifestyle change: anxiety/depression  Demonstrated degree of understanding via:  Teach Back   Monitoring/Evaluation:  Dietary intake, exercise, and body weight. Follow up in 2 weeks

## 2017-12-25 ENCOUNTER — Ambulatory Visit: Payer: BLUE CROSS/BLUE SHIELD | Admitting: Nurse Practitioner

## 2018-01-21 ENCOUNTER — Encounter: Payer: BLUE CROSS/BLUE SHIELD | Attending: General Surgery | Admitting: Skilled Nursing Facility1

## 2018-01-21 ENCOUNTER — Encounter: Payer: Self-pay | Admitting: Skilled Nursing Facility1

## 2018-01-21 DIAGNOSIS — E669 Obesity, unspecified: Secondary | ICD-10-CM

## 2018-01-21 DIAGNOSIS — Z713 Dietary counseling and surveillance: Secondary | ICD-10-CM | POA: Diagnosis present

## 2018-01-21 DIAGNOSIS — Z6838 Body mass index (BMI) 38.0-38.9, adult: Secondary | ICD-10-CM | POA: Diagnosis not present

## 2018-01-21 NOTE — Progress Notes (Signed)
Follow-up visit:  Post-Operative RYGB Surgery  Primary concerns today: Post-operative Bariatric Surgery Nutrition Management   Pt arrives with "Kelly Barber". Pt states due to time management she is having 3 meals or less a day. Pt states she has Been eating plain greek yogurt and adds ranch packet states she did not know she could have flavored greek yogurt. Pt states she feels very anxious around food and has a lot of fear with eating foods. Pt states she still sees herself as an obese person and sits far back from her desk thinking she needs the extra room. Pt states she is very afraid of eating foods that she thinks of as bad because they will cause her to gain too much weight and have obesity yet again. Pt states she has not been working with her talk therapist in over a month. Dietitian advised pt to make an appt with her talk therapist and call the dietitian for an appt when she has that appt scheduled. Dietitian also advsied pt to go to support group this week and gave her the information for time and place.  Surgery date: 09/02/2017 Surgery type: RYGB [facility]: 241.3 Weight today: 181 Wt change: 8 pound  TANITA  BODY COMP RESULTS  12/03/2017 12/23/2017 01/21/2018   BMI (kg/m^2) 30 29.7 28.3   Fat Mass (lbs) 75.2 71.2 68   Fat Free Mass (lbs) 116.4 118.6 113   Total Body Water (lbs) 83 84.6 80   24-hr recall: 3-4 days a week 3 meals; pt is a poor historian; eating meals out of the house  B (7:30-8:30AM):  2 scrambled eggs from [employer] Snk (10AM):  100 calorie pack of peanuts L (12:15PM): half wendys chili or boiled eggs Snk (PM): 100 calorie of peanuts  D (4-6PM): half wendys chili or chicken 3 ounces and pintos Snk (PM): vienna sausage   Fluid intake: 100 ounces: 32 oz of plain water then flavored with packets and diet green tea Estimated total protein intake: 60  Medications: sliding scale for insulin and no longer on blood pressure medication  Supplementation:  bariatric advantage, calcium   CBG monitoring: 90-160 Average CBG per patient: 110 Last patient reported A1c: 8.9  Using straws: no Drinking while eating: no Having you been chewing well:no Chewing/swallowing difficulties: no Changes in vision: no Changes to mood/headaches: no Hair loss/Cahnges to skin/Changes to nails: no Any difficulty focusing or concentrating: no Sweating: no Dizziness/Lightheaded: no Palpitations: no  Carbonated beverages: no N/V/D/C/GAS: no Abdominal Pain: no Dumping syndrome: no  Recent physical activity:  Some walking  Progress Towards Goal(s):  In progress.   Nutritional Diagnosis:  [place]-3.3 Overweight/obesity related to past poor dietary habits and physical inactivity as evidenced by patient w/ recent RYGB surgery following dietary guidelines for continued weight loss.  Intervention:  Nutrition cousneling. Dietitian worked with pt on meeting her dietary needs. Goals: -Eat 3 meals a day -Make an appt with your therapist   Teaching Method Utilized: Visual Auditory Hands on  Barriers to learning/adherence to lifestyle change: anxiety/depression  Demonstrated degree of understanding via:  Teach Back   Monitoring/Evaluation:  Dietary intake, exercise, and body weight. Follow up in 2 weeks

## 2018-02-12 ENCOUNTER — Ambulatory Visit: Payer: BLUE CROSS/BLUE SHIELD | Admitting: Podiatry

## 2018-02-12 ENCOUNTER — Encounter: Payer: Self-pay | Admitting: Podiatry

## 2018-02-12 DIAGNOSIS — M79676 Pain in unspecified toe(s): Secondary | ICD-10-CM

## 2018-02-12 DIAGNOSIS — E0843 Diabetes mellitus due to underlying condition with diabetic autonomic (poly)neuropathy: Secondary | ICD-10-CM

## 2018-02-12 DIAGNOSIS — I739 Peripheral vascular disease, unspecified: Secondary | ICD-10-CM

## 2018-02-12 DIAGNOSIS — B351 Tinea unguium: Secondary | ICD-10-CM | POA: Diagnosis not present

## 2018-02-16 NOTE — Progress Notes (Signed)
Visit  SUBJECTIVE Patient with a history of type 2 diabetes mellitus presents to office today complaining of elongated, thickened nails that cause pain while ambulating in shoes. She is unable to trim her own nails. Patient is here for further evaluation and treatment.   Past Medical History:  Diagnosis Date  . Anemia   . Anxiety    takes xanax daily  . Anxiety disorder   . Depression, major    takes wellbutrin and celexa daily  . Diabetes mellitus    type 2   . Fibromyalgia   . GERD (gastroesophageal reflux disease)    doesn't take any meds  . H/O hiatal hernia   . Hyperlipidemia   . Hypertension    takes Lisinopril and Diovan daily  . Hyperthyroidism    yrs ago but doesn't require meds for this  . Immune deficiency disorder (HCC)    pt states she has no clue what this is  . Insomnia   . Joint pain   . Joint swelling   . PTSD (post-traumatic stress disorder)   . PTSD (post-traumatic stress disorder)   . PVD (peripheral vascular disease) (HCC)   . Sleep apnea     OBJECTIVE General Patient is awake, alert, and oriented x 3 and in no acute distress. Derm Skin is dry and supple bilateral. Negative open lesions or macerations. Remaining integument unremarkable. Nails are tender, long, thickened and dystrophic with subungual debris, consistent with onychomycosis, digits 4-5 left and 1-3 right. No signs of infection noted. Vasc  DP and PT pedal pulses palpable bilaterally. Temperature gradient within normal limits.  Neuro Epicritic and protective threshold sensation diminished bilaterally.  Musculoskeletal Exam No symptomatic pedal deformities noted bilateral. Muscular strength within normal limits.  ASSESSMENT 1. Diabetes Mellitus w/ peripheral neuropathy 2. Onychomycosis of nail due to dermatophyte bilateral 3. Pain in foot bilateral 4. H/o toe amputation 1-3 left 5. H/o toe amputation 4-5 right  6. PVD BLE  PLAN OF CARE 1. Patient evaluated today. 2. Instructed to  maintain good pedal hygiene and foot care. Stressed importance of controlling blood sugar.  3. Mechanical debridement of nails 4-5 left, 1-3 right performed using a nail nipper. Filed with dremel without incident.  4. Return to clinic in 3 mos.   Recently had bariatric surgery and lost 45 pounds.    Sanjit Mcmichael M. Lorelle Macaluso, DPM [facility]  Dr. Carlas Vandyne M. Jackqulyn Mendel, DPM    [address]                                        [place], [place] [address]                Office [phone]  Fax [phone]      

## 2018-05-14 ENCOUNTER — Ambulatory Visit: Payer: BLUE CROSS/BLUE SHIELD | Admitting: Podiatry

## 2018-05-19 ENCOUNTER — Other Ambulatory Visit: Payer: Self-pay | Admitting: Family Medicine

## 2018-05-19 DIAGNOSIS — Z1231 Encounter for screening mammogram for malignant neoplasm of breast: Secondary | ICD-10-CM

## 2018-06-12 ENCOUNTER — Ambulatory Visit: Payer: BLUE CROSS/BLUE SHIELD

## 2018-07-23 ENCOUNTER — Ambulatory Visit: Payer: BLUE CROSS/BLUE SHIELD

## 2018-10-06 ENCOUNTER — Telehealth: Payer: Self-pay

## 2018-10-06 NOTE — Telephone Encounter (Signed)
Received notice from [facility] that pt's cpap was picked up due to non compliance.  I called pt. She would like to keep her appt on 8/6 to discuss alternative tx options. I reminded pt of appt date and time and of check in time. Pt verbalized understanding.

## 2018-10-09 ENCOUNTER — Ambulatory Visit: Payer: 59 | Admitting: Neurology

## 2018-10-09 ENCOUNTER — Other Ambulatory Visit: Payer: Self-pay

## 2018-10-09 NOTE — Telephone Encounter (Signed)
Pt did not show for their appt with Dr. Athar today.  

## 2018-11-19 ENCOUNTER — Other Ambulatory Visit: Payer: Self-pay

## 2018-11-19 ENCOUNTER — Encounter: Payer: Self-pay | Admitting: Neurology

## 2018-11-19 ENCOUNTER — Ambulatory Visit: Payer: 59 | Admitting: Neurology

## 2018-11-19 VITALS — BP 158/93 | HR 78 | Ht 67.0 in | Wt 187.0 lb

## 2018-11-19 DIAGNOSIS — G4733 Obstructive sleep apnea (adult) (pediatric): Secondary | ICD-10-CM

## 2018-11-19 DIAGNOSIS — E049 Nontoxic goiter, unspecified: Secondary | ICD-10-CM

## 2018-11-19 DIAGNOSIS — E663 Overweight: Secondary | ICD-10-CM

## 2018-11-19 DIAGNOSIS — Z9884 Bariatric surgery status: Secondary | ICD-10-CM | POA: Diagnosis not present

## 2018-11-19 DIAGNOSIS — R634 Abnormal weight loss: Secondary | ICD-10-CM | POA: Diagnosis not present

## 2018-11-19 DIAGNOSIS — R0683 Snoring: Secondary | ICD-10-CM

## 2018-11-19 DIAGNOSIS — Z789 Other specified health status: Secondary | ICD-10-CM

## 2018-11-19 NOTE — Patient Instructions (Signed)
You have lost significant weight after your weight loss surgery, keep up the good work.  Let us reevaluate your sleep apnea with a sleep study.  Your previous diagnosis was severe sleep apnea but this was before your weight loss surgery last year.You may still have some degree of obstructive sleep apnea and CPAP therapy may still be your best option, we will be in touch after testing is completed and go from there.

## 2018-11-19 NOTE — Progress Notes (Signed)
Subjective:    Patient ID: Kelly Barber is a 53 y.o. female.  HPI     Interim history:   Kelly Barber is a 53 year old right-handed woman with an underlying medical history of hypertension, hyperlipidemia, hyperparathyroidism, reflux disease, poorly controlled diabetes, anxiety, depression, anemia, peripheral vascular disease with s/p fem-pop bypass x 3, s/p 5 total toe amputations, history of PTSD (by chart review) and obesity, who presents for follow-up consultation of her obstructive sleep apnea.  The patient is unaccompanied today.  I last saw her on 06/25/2017, at which time we talked about her split-night sleep study results from 03/13/2017 indicating severe sleep apnea with a baseline AHI of 72.7/h, O2 nadir of 75%.  She had trouble using CPAP, she was compliant in the early part of the year in 2019, from mid February through mid March 2019.  She was planning bariatric surgery.  She was struggling with dry mouth and dry eyes.  She had intermittent issues with congestion and more sneezing.  She had not tried any over-the-counter allergy medication for this.  She was using a fullface mask.  She was advised to follow-up in 6 months.   Per DME company, she lost coverage for her CPAP in the interim due to noncompliance.  Today, 11/19/2018: She reports that she has lost quite a bit of weight, she estimates that she lost 60 to 70 pounds since her maximum weight, she had gastric bypass surgery on 09/02/2017.  She wants to lose more weight, her goal is to be in the 160s.  She has developed issues with her thyroid, goiter, she may be looking at thyroidectomy.  She has an appointment with the endocrinologist next week, she believes it is Dr. Chalmers Barber.  She had difficulty tolerating the CPAP, and was uncomfortable, she could not sleep and the position she wanted to, she took off the mask in the middle of the night.  She lost coverage of her CPAP shortly before her bariatric surgery but does report she needed either  additional oxygen or ventilatory support after her bariatric surgery as she recalls. She still snores.  The patient's allergies, current medications, family history, past medical history, past social history, past surgical history and problem list were reviewed and updated as appropriate.    Previously (copied from previous notes for reference):   I first met her on 01/29/2017 at the request of her bariatric surgeon, at which time she reported snoring and daytime somnolence. I advised her to proceed with sleep study testing. She had a split-night sleep study on 03/13/2017. I went over her test results with her in detail today. Sleep latency was 28.5 minutes at baseline with a sleep efficiency of 74.8%, REM sleep was absent. She had moderate to loud snoring. Total AHI at baseline was 72.7 per hour, average oxygen saturation 92%, nadir was 75% with significant time below 89% saturation of nearly 2 hours. She was fitted with a full facemask. CPAP was titrated from 5 cm to 13 cm. On the final pressure her AHI was 0 per hour with supine sleep achieved but non-REM sleep. O2 nadir was 92%. She had no significant PLMS during the study. Based on her test results I prescribed CPAP therapy for home use at a pressure of 13 cm via full facemask.   I reviewed her CPAP compliance data from 05/25/2017 through 06/23/2017 which is a total of 30 days, during which time she used her machine every night with percent used days greater than 4 hours of only  37%, indicating significantly suboptimal compliance with an average usage of 3 hours and 29 minutes only, residual AHI at goal at 0.7 per hour, leak high with the 95th percentile at 41.4 L/m on a pressure of 13 cm with EPR of 3.    01/29/2017: (She) reports snoring and excessive daytime sleepiness. I reviewed your office note from 11/28/2016, which you kindly included. She is being evaluated for bariatric surgery. Her Epworth sleepiness score is 15 out of 24, fatigue score is  62 out of 63. She lives with her son and her daughter. She quit smoking 8 years ago and does not utilize alcohol. She drinks caffeine in the form of soda, about 2 per day. Trying to reduce tea.  She sees Dr. Toy Barber for PTSD, anxiety and depression. She is on Wellbutrin 150 mg once daily long-acting, Trentellix 20 mg daily and Xanax 1 mg up to 4 times a day as needed She sleeps with the TV on, which helps for her to be distracted at night, has 2 small dogs in the bed. She has been told she has apneas and Dr. Katy Barber, eye doctor has told her he has concerns she has OSA. 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 She has not had frequent morning headaches. She has nocturia about once or twice per average night. She does not wake up rested. She has a high stress job. She also takes Barber of her of her 92 year old son who lives with her and is autistic, nonverbal. She has a 6 year old daughter. Her bedtime is around 7 or 8 PM, wakeup time around 6:30 or 7.   Her Past Medical History Is Significant For: Past Medical History:  Diagnosis Date  . Anemia   . Anxiety    takes xanax daily  . Anxiety disorder   . Depression, major    takes wellbutrin and celexa daily  . Diabetes mellitus    type 2   . Fibromyalgia   . GERD (gastroesophageal reflux disease)    doesn't take any meds  . H/O hiatal hernia   . Hyperlipidemia   . Hypertension    takes Lisinopril and Diovan daily  . Hyperthyroidism    yrs ago but doesn't require meds for this  . Immune deficiency disorder ([place])    pt states she has no clue what this is  . Insomnia   . Joint pain   . Joint swelling   . PTSD (post-traumatic stress disorder)   . PTSD (post-traumatic stress disorder)   . PVD (peripheral vascular disease) ([place])   . Sleep apnea     Her Past Surgical History Is Significant For: Past Surgical History:  Procedure Laterality Date  . ABDOMINAL AORTAGRAM N/A 08/10/2011    Procedure: ABDOMINAL [name];  Surgeon: Elam Dutch, MD;  Location[facility] CATH LAB;  Service: Cardiovascular;  Laterality: N/A;  . AMPUTATION     lft foot toes 1.2.3  . AMPUTATION  01/07/2012   Procedure: AMPUTATION RAY;  Surgeon: Elam Dutch, MD;  Location: [facility] OR;  Service: Vascular;  Laterality: Right;  4TH & 5TH toes  . aortogram    . CHOLECYSTECTOMY  1990  . COLONOSCOPY    . DILATION AND CURETTAGE OF UTERUS    . FEMORAL BYPASS  04/29/2008   left  . FEMORAL-POPLITEAL BYPASS GRAFT  08/27/2011   Procedure: BYPASS GRAFT FEMORAL-POPLITEAL ARTERY;  Surgeon: Elam Dutch, MD;  Location: [facility];  Service: Vascular;  Laterality: Right;  .  FEMORAL-POPLITEAL BYPASS GRAFT  08/29/2011   Procedure: BYPASS GRAFT FEMORAL-POPLITEAL ARTERY;  Surgeon: Elam Dutch, MD;  Location: [facility] OR;  Service: Vascular;  Laterality: Right;  Vein Patch Angionplasty and Thrombectomy   . GASTRIC ROUX-EN-Y N/A 09/02/2017   Procedure: LAPAROSCOPIC ROUX-EN-Y GASTRIC BYPASS WITH UPPER ENDOSCOPY AND ERAS PATHWAY;  Surgeon: Kinsinger, Arta Bruce, MD;  Location: [facility] ORS;  Service: General;  Laterality: N/A;  . INTRAOPERATIVE ARTERIOGRAM  08/29/2011   Procedure: INTRA OPERATIVE ARTERIOGRAM;  Surgeon: Elam Dutch, MD;  Location: [facility];  Service: Vascular;  Laterality: Right;  . LOWER EXTREMITY ANGIOGRAM N/A 08/10/2011   Procedure: LOWER EXTREMITY ANGIOGRAM;  Surgeon: Elam Dutch, MD;  Location: [facility] C[facility] LAB;  Service: Cardiovascular;  Laterality: N/A;  . TOE AMPUTATION  07/12/2008    left foot toes 1,2,3  . TUBAL LIGATION    . VASCULAR SURGERY      Her Family History Is Significant For: Family History  Problem Relation Age of Onset  . Diabetes Mother   . Hyperlipidemia Mother   . Hypertension Mother   . Diabetes Father   . Heart disease Father        before age 27  . Hypertension Father   . Hyperlipidemia Father   . Hypertension Sister   . Diabetes Brother     Her Social History Is Significant  For: Social History   Socioeconomic History  . Marital status: Divorced    Spouse name: Not on file  . Number of children: Not on file  . Years of education: Not on file  . Highest education level: Not on file  Occupational History  . Not on file  Social Needs  . Financial resource strain: Not on file  . Food insecurity    Worry: Not on file    Inability: Not on file  . Transportation needs    Medical: Not on file    Non-medical: Not on file  Tobacco Use  . Smoking status: Former Smoker    Packs/day: 1.00    Years: 10.00    Pack years: 10.00    Types: Cigarettes    Quit date: 07/25/2008    Years since quitting: 10.3  . Smokeless tobacco: Never Used  . Tobacco comment: quit 22yr ago  Substance and Sexual Activity  . Alcohol use: No  . Drug use: No  . Sexual activity: Not Currently  Lifestyle  . Physical activity    Days per week: Not on file    Minutes per session: Not on file  . Stress: Not on file  Relationships  . Social [place] phone: Not on file    Gets together: Not on file    Attends religious service: Not on file    Active member of club or organization: Not on file    Attends meetings of clubs or organizations: Not on file    Relationship status: Not on file  Other Topics Concern  . Not on file  Social History Narrative  . Not on file    Her Allergies Are:  Allergies  Allergen Reactions  . Amoxicillin-Pot Clavulanate Shortness Of Breath    Has patient had a PCN reaction causing immediate rash, facial/tongue/throat swelling, SOB or lightheadedness with hypotension: Yes Has patient had a PCN reaction causing severe rash involving mucus membranes or skin necrosis: No Has patient had a PCN reaction that required hospitalization: Yes Has patient had a PCN reaction occurring within the last 10 years: No If  all of the above answers are "NO", then may proceed with Cephalosporin use.   . Codeine Anaphylaxis  . Darvocet [Propoxyphene  N-Acetaminophen] Anaphylaxis    Plain Tylenol ok  . Oxycodone Anaphylaxis  . Statins Other (See Comments)    Joint pain  :   Her Current Medications Are:  Outpatient Encounter Medications as of 11/19/2018  Medication Sig  . ALPRAZolam (XANAX) 1 MG tablet TAKE 1 MG BY MOUTH 4 TIMES DAILY AS NEEDED FOR ANXIETY  . DULoxetine (CYMBALTA) 60 MG capsule Take 120 mg by mouth at bedtime.  . [DISCONTINUED] traMADol (ULTRAM) 50 MG tablet Take 1 tablet (50 mg total) by mouth every 6 (six) hours as needed (pain).  . [DISCONTINUED] gabapentin (NEURONTIN) 300 MG capsule Take 1 capsule (300 mg total) by mouth every 8 (eight) hours. for 30 days  . [DISCONTINUED] ketotifen (ZADITOR) 0.025 % ophthalmic solution Place 1 drop into both eyes 2 (two) times daily as needed (allergies).  . [DISCONTINUED] losartan-hydrochlorothiazide (HYZAAR) 100-12.5 MG tablet Take 1 tablet by mouth every evening.   . [DISCONTINUED] NOVOLIN N 100 UNIT/ML injection Inject 0.09 mLs (9 Units total) into the skin 2 (two) times daily.  . [DISCONTINUED] ondansetron (ZOFRAN-ODT) 4 MG disintegrating tablet Take 1 tablet (4 mg total) by mouth every 6 (six) hours as needed for nausea or vomiting.  . [DISCONTINUED] pantoprazole (PROTONIX) 40 MG tablet Take 1 tablet (40 mg total) by mouth daily.  . [DISCONTINUED] Polyvinyl Alcohol-Povidone (REFRESH OP) Place 1 drop into both eyes 2 (two) times daily as needed (dry eyes).   No facility-administered encounter medications on file as of 11/19/2018.   :  Review of Systems:  Out of a complete 14 point review of systems, all are reviewed and negative with the exception of these symptoms as listed below: Review of Systems  Neurological:       Pt presents today to discuss her sleep. Pt has lost weight after her bariatric surgery.  Pt does endorse snoring. Pt could tolerate cpap but she felt like the mask leaked. Pt is interested in alternative treatments to her cpap.    Objective:  Neurological  Exam  Physical Exam Physical Examination:   Vitals:   11/19/18 0944  BP: (!) 158/93  Pulse: 78    General Examination: The patient is a very pleasant 53 y.o. female in no acute distress. She appears well-developed and well-nourished and well groomed.   HEENT:Normocephalic, atraumatic, pupils are equal, round and reactive to light. Extraocular tracking is good without limitation to gaze excursion or nystagmus noted. Normal smooth pursuit is noted. Hearing is grossly intact. Face is symmetric with normal facial animation. Speech is clear with no dysarthria noted. There is no hypophonia. There is no lip, neck/head, jaw or voice tremor. Neck is supple with full range of passive and active motion. There are no carotid bruits on auscultation. Oropharynx exam reveals:mild mouth dryness, adequatedental hygiene and moderateairway crowding.Tongue protrudes centrally and palate elevates symmetrically.   Chest:Clear to auscultation without wheezing, rhonchi or crackles noted.  Heart:S1+S2+0, regular and normal without murmurs, rubs or gallops noted.   Abdomen:Soft, non-tender and non-distended with normal bowel sounds appreciated on auscultation.  Extremities:There isno pitting edema in the distal lower extremities bilaterally.   Skin: Warm and dry without trophic changes noted.  Musculoskeletal: exam reveals no obvious joint deformities, tenderness or joint swelling or erythema. (s/p toe amputations bilaterally, not confirmed on exam today).   Neurologically:  Mental status: The patient is awake, alert and oriented in  all 4 spheres.Herimmediate and remote memory, attention, language skills and fund of knowledge are appropriate. There is no evidence of aphasia, agnosia, apraxia or anomia. Speech is clear with normal prosody and enunciation. Thought process is linear. Mood is normaland affect is normal.  Cranial nerves II - XII are as described above under HEENT exam.  HimMotor  exam: Normal bulk, strength and tone is noted. There is no tremor. Fine motor skills and coordination: grossly intact.  Cerebellar testing: No dysmetria or intention tremor. There is no truncal or gait ataxia.  Sensory exam: intact to light touch in the upper and lower extremities.  Gait, station and balance:Shestands easily. No veering to one side is noted. No leaning to one side is noted. Posture is age-appropriate and stance is narrow based. Gait showsnormalstride length and normalpace.  Assessmentand Plan:  In summary,Kelly T Thorpeis a very pleasant 61 year oldfemalewith an underlyingcomplexmedical history of hypertension, hyperlipidemia, high parathyroidism, reflux disease, poorly controlled diabetes, anxiety, depression, anemia, peripheral vascular disease with s/p fem-pop bypass x 3, s/p 5 total toe amputations, history of PTSD and obesity With status post weight loss surgery in July 2019, who presents for follow-up consultation of her severe obstructive sleep apnea. Her split-night sleep study in January 2019 showed a baseline AHI of 72.7 per hour , O2 nadir of 75%.  She no longer has a CPAP machine, she lost coverage for it in or around May 2019.  She has achieved quite a bit of weight loss since her bariatric surgery last year.  She has lost about 60 or 70 pounds altogether.  She still has some snoring and sleep disruption.  She is advised to return for a sleep study to reevaluate her obstructive sleep apnea and we talked about treatment options for her today. She is continuing to work on her weight loss.  We talked about the possibility of utilizing a dental device versus surgical treatments.  Unfortunately, she has had dental problems as well.  Realistically, if she has more than just mild residual obstructive sleep apnea, positive airway treatment may be the best option for her.  She is open to that possibility and would be willing to try CPAP or AutoPap again if the need arises.   We will proceed with sleep study testing and take it from there.  I answered all her questions today and she was in agreement. I spent 25 minutes in total face-to-face time with the patient, more than 50% of which was spent in counseling and coordination of Barber, reviewing test results, reviewing medication and discussing or reviewing the diagnosis of OSA, its prognosis and treatment options. Pertinent laboratory and imaging test results that were available during this visit with the patient were reviewed by me and considered in my medical decision making (see chart for details).

## 2018-11-27 ENCOUNTER — Other Ambulatory Visit (HOSPITAL_COMMUNITY): Payer: Self-pay | Admitting: Internal Medicine

## 2018-11-27 ENCOUNTER — Other Ambulatory Visit: Payer: Self-pay | Admitting: Internal Medicine

## 2018-11-27 DIAGNOSIS — E042 Nontoxic multinodular goiter: Secondary | ICD-10-CM

## 2018-12-01 ENCOUNTER — Ambulatory Visit (INDEPENDENT_AMBULATORY_CARE_PROVIDER_SITE_OTHER): Payer: 59 | Admitting: Neurology

## 2018-12-01 DIAGNOSIS — Z9884 Bariatric surgery status: Secondary | ICD-10-CM

## 2018-12-01 DIAGNOSIS — R0683 Snoring: Secondary | ICD-10-CM

## 2018-12-01 DIAGNOSIS — R634 Abnormal weight loss: Secondary | ICD-10-CM

## 2018-12-01 DIAGNOSIS — Z789 Other specified health status: Secondary | ICD-10-CM

## 2018-12-01 DIAGNOSIS — G4733 Obstructive sleep apnea (adult) (pediatric): Secondary | ICD-10-CM | POA: Diagnosis not present

## 2018-12-01 DIAGNOSIS — E663 Overweight: Secondary | ICD-10-CM

## 2018-12-01 DIAGNOSIS — E049 Nontoxic goiter, unspecified: Secondary | ICD-10-CM

## 2018-12-04 NOTE — Procedures (Signed)
Patient Information     First Name: Kelly Last Name: Barber ID: [id number]  Birth Date: 12-Feb-1966 Age: 53 Gender: Female  Referring Provider: Zachery Dauer, FNP BMI: 29.4 (W=187 lb, H=5' 7'')       Sleep Study Information    Study Date: Dec 01, 2018 S/H/A Version: [id number] / 4.1.1528 / 71  History:    53 year old woman with a history of hypertension, hyperlipidemia, hyperparathyroidism, reflux disease, poorly controlled diabetes, anxiety, depression, anemia, peripheral vascular disease with s/p fem-pop bypass x 3, s/p 5 total toe amputations, history of PTSD (by chart review) and obesity, with s/p weight loss surgery who presents for re-evaluation of her prior diagnosis of severe obstructive sleep apnea.  Summary & Diagnosis:    Mild OSA  Recommendations:      This home sleep test demonstrates overall mild obstructive sleep apnea with a total AHI of 5.8/hour and O2 nadir of 80%. She had several desaturations into the 80s. Given the patient's medical history and sleep related complaints, treatment with positive airway pressure is recommended. This can be achieved in the form of autoPAP trial/titration at home. A  full night CPAP titration study will help with proper treatment settings and mask fitting if needed. Alternative treatments may include weight loss along with avoidance of the supine sleep position, or an oral appliance in appropriate candidates.   Please note that untreated obstructive sleep apnea may carry additional perioperative morbidity. Patients with significant obstructive sleep apnea should receive perioperative PAP therapy and the surgeons and particularly the anesthesiologist should be informed of the diagnosis and the severity of the sleep disordered breathing. The patient should be cautioned not to drive, work at heights, or operate dangerous or heavy equipment when tired or sleepy. Review and reiteration of good sleep hygiene measures should be pursued with any patient.  Other causes of the patient's symptoms, including circadian rhythm disturbances, an underlying mood disorder, medication effect and/or an underlying medical problem cannot be ruled out based on this test. Clinical correlation is recommended.   The patient and her referring provider will be notified of the test results. The patient will be seen in follow up in sleep clinic at [facility].  I certify that I have reviewed the raw data recording prior to the issuance of this report in accordance with the standards of the American Academy of Sleep Medicine (AASM).  Huston Foley, MD, PhD [facility]) Diplomat, ABPN (Neurology and Sleep)           Sleep Summary  Oxygen Saturation Statistics   Start Study Time: End Study Time: Total Recording Time:     11:12:25 PM 9:03:09 AM      9 h, 50 min  Total Sleep Time % REM of Sleep Time:  6 h, 31 min  27.4    Mean: 94 Minimum: 80 Maximum: 99  Mean of Desaturations Nadirs (%):   92  Oxygen Desaturation. %:   4-9 10-20 >20 Total  Events Number Total    18  1 94.7 5.3  0 0.0  19 100.0  Oxygen Saturation: <90 <=88 <85 <80 <70  Duration (minutes): Sleep % 0.1 0.0  0.1 0.1  0.0 0.0 0.0 0.0 0.0 0.0     Respiratory Indices      Total Events REM NREM All Night  pRDI:  32  pAHI:  32 ODI:  19  pAHIc:  6  % CSR: 0.0 9.9 9.9 4.6 2.3 3.9 3.9 2.9 0.5 5.8 5.8 3.4  1.1       Pulse Rate Statistics during Sleep (BPM)      Mean: 82 Minimum: 42 Maximum: 112    Indices are calculated using technically valid sleep time of  5 hrs, 32 min. Central-Indices are calculated using technically valid sleep time of  5  hrs, 24 min. pRDI/pAHI are calculated using oxi desaturations ? 3%  Body Position Statistics  Position Supine Prone Right Left Non-Supine  Sleep (min) 45.0 170.7 99.0 76.5 346.2  Sleep % 11.5 43.6 25.3 19.6 88.5  pRDI 6.4 1.8 14.6 7.2 5.7  pAHI 6.4 1.8 14.6 7.2 5.7  ODI 6.4 0.7 10.7 2.4 3.2     Snoring  Statistics Snoring Level (dB) >40 >50 >60 >70 >80 >Threshold (45)  Sleep (min) 163.1 24.9 7.8 3.3 0.0 48.6  Sleep % 41.7 6.4 2.0 0.8 0.0 12.4    Mean: 42 dB Sleep Stages Chart

## 2018-12-04 NOTE — Addendum Note (Signed)
Addended by: Star Age on: 12/04/2018 07:18 PM   Modules accepted: Orders

## 2018-12-04 NOTE — Progress Notes (Signed)
Patient was last seen by me on 11/19/18 for re-eval of OSA post wt loss surgery, she had a HST on 12/01/18.    Please call and notify the patient that the recent sleep study did confirm the diagnosis of obstructive sleep apnea. OSA is overall mild, but still worth treating, especially, since her O2 saturations dropped to 80%. I would recommend treatment for her now mild OSA with autoPAP, which means, I will Rx an autoPAP machine for home use home, through a DME company (of her choice, or as per insurance requirement). The DME representative will educate her on how to use the machine, how to put the mask on, etc. I have placed an order in the chart. Please send referral, talk to patient, send report to referring MD. We will need a FU in sleep clinic for 10 weeks post-PAP set up, please arrange that with me or one of our NPs. Thanks,   Star Age, MD, PhD [facility])

## 2018-12-08 ENCOUNTER — Telehealth: Payer: Self-pay

## 2018-12-08 NOTE — Telephone Encounter (Signed)
-----   Message from Star Age, MD sent at 12/04/2018  7:18 PM EDT ----- Patient was last seen by me on 11/19/18 for re-eval of OSA post wt loss surgery, she had a HST on 12/01/18.    Please call and notify the patient that the recent sleep study did confirm the diagnosis of obstructive sleep apnea. OSA is overall mild, but still worth treating, especially, since her O2 saturations dropped to 80%. I would recommend treatment for her now mild OSA with autoPAP, which means, I will Rx an autoPAP machine for home use home, through a DME company (of her choice, or as per insurance requirement). The DME representative will educate her on how to use the machine, how to put the mask on, etc. I have placed an order in the chart. Please send referral, talk to patient, send report to referring MD. We will need a FU in sleep clinic for 10 weeks post-PAP set up, please arrange that with me or one of our NPs. Thanks,   Star Age, MD, PhD [facility])

## 2018-12-08 NOTE — Telephone Encounter (Signed)
I called pt. I advised pt that Dr. Rexene Alberts reviewed their sleep study results and found that pt has mild osa. Dr. Rexene Alberts recommends that pt start an auto pap at home. I reviewed PAP compliance expectations with the pt. Pt is agreeable to starting an auto-PAP. I advised pt that an order will be sent to a DME, [facility], and [facility] will call the pt within about one week after they file with the pt's insurance. [facility] will show the pt how to use the machine, fit for masks, and troubleshoot the auto-PAP if needed. A follow up appt was made for insurance purposes with Jinny Blossom, NP on 02/25/2019 at 11:00am. Pt verbalized understanding to arrive 15 minutes early and bring their auto-PAP. A letter with all of this information in it will be mailed to the pt as a reminder. I verified with the pt that the address we have on file is correct. Pt verbalized understanding of results. Pt had no questions at this time but was encouraged to call back if questions arise. I have sent the order to [facility] and have received confirmation that they have received the order.

## 2018-12-09 DIAGNOSIS — E119 Type 2 diabetes mellitus without complications: Secondary | ICD-10-CM | POA: Insufficient documentation

## 2018-12-09 DIAGNOSIS — I10 Essential (primary) hypertension: Secondary | ICD-10-CM | POA: Insufficient documentation

## 2018-12-09 DIAGNOSIS — E079 Disorder of thyroid, unspecified: Secondary | ICD-10-CM | POA: Insufficient documentation

## 2018-12-09 DIAGNOSIS — D649 Anemia, unspecified: Secondary | ICD-10-CM | POA: Insufficient documentation

## 2018-12-11 ENCOUNTER — Other Ambulatory Visit: Payer: Self-pay | Admitting: Obstetrics & Gynecology

## 2018-12-11 DIAGNOSIS — R928 Other abnormal and inconclusive findings on diagnostic imaging of breast: Secondary | ICD-10-CM

## 2018-12-16 ENCOUNTER — Other Ambulatory Visit: Payer: Self-pay

## 2018-12-16 ENCOUNTER — Ambulatory Visit
Admission: RE | Admit: 2018-12-16 | Discharge: 2018-12-16 | Disposition: A | Discharge status: Home / Self Care | Payer: 59 | Source: Ambulatory Visit | Attending: Obstetrics & Gynecology | Admitting: Obstetrics & Gynecology

## 2018-12-16 DIAGNOSIS — R928 Other abnormal and inconclusive findings on diagnostic imaging of breast: Secondary | ICD-10-CM

## 2018-12-17 ENCOUNTER — Encounter (HOSPITAL_COMMUNITY): Payer: 59

## 2018-12-17 ENCOUNTER — Encounter (HOSPITAL_COMMUNITY)
Admission: RE | Admit: 2018-12-17 | Discharge: 2018-12-17 | Disposition: A | Discharge status: Home / Self Care | Payer: 59 | Source: Ambulatory Visit | Attending: Internal Medicine | Admitting: Internal Medicine

## 2018-12-17 DIAGNOSIS — E042 Nontoxic multinodular goiter: Secondary | ICD-10-CM | POA: Insufficient documentation

## 2018-12-18 ENCOUNTER — Encounter (HOSPITAL_COMMUNITY): Payer: 59

## 2018-12-22 ENCOUNTER — Encounter (HOSPITAL_COMMUNITY): Payer: 59

## 2018-12-22 ENCOUNTER — Encounter (HOSPITAL_COMMUNITY)
Admission: RE | Admit: 2018-12-22 | Discharge: 2018-12-22 | Disposition: A | Discharge status: Home / Self Care | Payer: 59 | Source: Ambulatory Visit | Attending: Internal Medicine | Admitting: Internal Medicine

## 2018-12-22 ENCOUNTER — Other Ambulatory Visit: Payer: Self-pay

## 2018-12-22 ENCOUNTER — Encounter (HOSPITAL_COMMUNITY): Payer: Self-pay

## 2018-12-22 DIAGNOSIS — E042 Nontoxic multinodular goiter: Secondary | ICD-10-CM | POA: Insufficient documentation

## 2018-12-22 MED ORDER — SODIUM IODIDE I-123 7.4 MBQ CAPS
434.0000 | ORAL_CAPSULE | Freq: Once | ORAL | Status: AC
  Administered 2018-12-22: 434.4 via ORAL

## 2018-12-23 ENCOUNTER — Ambulatory Visit (HOSPITAL_COMMUNITY): Payer: 59

## 2018-12-23 ENCOUNTER — Encounter (HOSPITAL_COMMUNITY)
Admission: RE | Admit: 2018-12-23 | Discharge: 2018-12-23 | Disposition: A | Discharge status: Home / Self Care | Payer: 59 | Source: Ambulatory Visit | Attending: Internal Medicine | Admitting: Internal Medicine

## 2018-12-23 ENCOUNTER — Other Ambulatory Visit (HOSPITAL_COMMUNITY): Payer: 59

## 2018-12-23 ENCOUNTER — Encounter (HOSPITAL_COMMUNITY): Payer: 59

## 2018-12-24 ENCOUNTER — Other Ambulatory Visit (HOSPITAL_COMMUNITY): Payer: 59

## 2018-12-31 ENCOUNTER — Other Ambulatory Visit: Payer: Self-pay | Admitting: Internal Medicine

## 2018-12-31 DIAGNOSIS — E042 Nontoxic multinodular goiter: Secondary | ICD-10-CM

## 2019-01-02 ENCOUNTER — Ambulatory Visit
Admission: RE | Admit: 2019-01-02 | Discharge: 2019-01-02 | Disposition: A | Discharge status: Home / Self Care | Payer: 59 | Source: Ambulatory Visit | Attending: Internal Medicine | Admitting: Internal Medicine

## 2019-01-02 DIAGNOSIS — E042 Nontoxic multinodular goiter: Secondary | ICD-10-CM

## 2019-01-13 ENCOUNTER — Other Ambulatory Visit: Payer: Self-pay | Admitting: Internal Medicine

## 2019-01-13 DIAGNOSIS — E042 Nontoxic multinodular goiter: Secondary | ICD-10-CM

## 2019-01-13 DIAGNOSIS — E0590 Thyrotoxicosis, unspecified without thyrotoxic crisis or storm: Secondary | ICD-10-CM

## 2019-01-22 ENCOUNTER — Other Ambulatory Visit: Payer: Self-pay

## 2019-01-22 ENCOUNTER — Ambulatory Visit
Admission: RE | Admit: 2019-01-22 | Discharge: 2019-01-22 | Disposition: A | Discharge status: Home / Self Care | Payer: 59 | Source: Ambulatory Visit | Attending: Internal Medicine | Admitting: Internal Medicine

## 2019-01-22 DIAGNOSIS — E0590 Thyrotoxicosis, unspecified without thyrotoxic crisis or storm: Secondary | ICD-10-CM

## 2019-01-22 DIAGNOSIS — E042 Nontoxic multinodular goiter: Secondary | ICD-10-CM

## 2019-01-22 MED ORDER — IOPAMIDOL (ISOVUE-300) INJECTION 61%
75.0000 mL | Freq: Once | INTRAVENOUS | Status: AC | PRN
  Administered 2019-01-22: 75 mL via INTRAVENOUS

## 2019-02-19 ENCOUNTER — Ambulatory Visit: Payer: Self-pay | Admitting: Surgery

## 2019-02-25 ENCOUNTER — Other Ambulatory Visit: Payer: Self-pay

## 2019-02-25 ENCOUNTER — Encounter: Payer: Self-pay | Admitting: Adult Health

## 2019-02-25 ENCOUNTER — Ambulatory Visit: Payer: 59 | Admitting: Adult Health

## 2019-02-25 VITALS — BP 140/93 | HR 76 | Temp 96.9°F | Ht 67.0 in | Wt 194.4 lb

## 2019-02-25 DIAGNOSIS — E049 Nontoxic goiter, unspecified: Secondary | ICD-10-CM

## 2019-02-25 DIAGNOSIS — Z9989 Dependence on other enabling machines and devices: Secondary | ICD-10-CM

## 2019-02-25 DIAGNOSIS — G4733 Obstructive sleep apnea (adult) (pediatric): Secondary | ICD-10-CM | POA: Diagnosis not present

## 2019-02-25 NOTE — Patient Instructions (Signed)

## 2019-02-25 NOTE — Progress Notes (Addendum)
PATIENT: Kelly Barber DOB: 1966/02/04  REASON FOR VISIT: follow up HISTORY FROM: patient  HISTORY OF PRESENT ILLNESS: Today 02/25/19:  Kelly Barber is a 53 year old female with a history of obstructive sleep apnea on CPAP.  His download indicates that he uses machine 30 out of 30 days for compliance of 100%.  She uses machine greater then 4 hours 15 days for compliance of 50%.  On average she uses her machine 3 hours and 55 minutes.  Her residual AHI is 12.3 on 5-12 cm of water.  Her pressure in the 95th percentile is 11.8 with maximum pressure at 12.  She does not have a significant leak.  She reports that she is still trying to get used to using the CPAP machine.  She also states that she was recently diagnosed with a goiter and will be having surgery.  She returns today for an evaluation.  CT soft tissue neck with contrast: Diffuse enlargement of the thyroid gland, more on the left lobe than the right, without discernible focal nodule. Recent ultrasound showed a diffusely heterogeneous nature. The gland has enlarged further since the study of 2007. Cephalo caudal length of the right lobe at that time was 7.3 cm in the left lobe was 7.8 cm.  HISTORY 11/19/2018: She reports that she has lost quite a bit of weight, she estimates that she lost 60 to 70 pounds since her maximum weight, she had gastric bypass surgery on 09/02/2017.  She wants to lose more weight, her goal is to be in the 160s.  She has developed issues with her thyroid, goiter, she may be looking at thyroidectomy.  She has an appointment with the endocrinologist next week, she believes it is Kelly Barber.  She had difficulty tolerating the CPAP, and was uncomfortable, she could not sleep and the position she wanted to, she took off the mask in the middle of the night.  She lost coverage of her CPAP shortly before her bariatric surgery but does report she needed either additional oxygen or ventilatory support after her bariatric  surgery as she recalls. She still snores.  The patient's allergies, current medications, family history, past medical history, past social history, past surgical history and problem list were reviewed and updated as appropriate.  REVIEW OF SYSTEMS: Out of a complete 14 system review of symptoms, the patient complains only of the following symptoms, and all other reviewed systems are negative.  See HPI  ALLERGIES: Allergies  Allergen Reactions  . Amoxicillin-Pot Clavulanate Shortness Of Breath    Has patient had a PCN reaction causing immediate rash, facial/tongue/throat swelling, SOB or lightheadedness with hypotension: Yes Has patient had a PCN reaction causing severe rash involving mucus membranes or skin necrosis: No Has patient had a PCN reaction that required hospitalization: Yes Has patient had a PCN reaction occurring within the last 10 years: No If all of the above answers are "NO", then may proceed with Cephalosporin use.   . Codeine Anaphylaxis  . Darvocet [Propoxyphene N-Acetaminophen] Anaphylaxis    Plain Tylenol ok  . Oxycodone Anaphylaxis  . Statins Other (See Comments)    Joint pain  . Propoxyphene     HOME MEDICATIONS: Outpatient Medications Prior to Visit  Medication Sig Dispense Refill  . ALPRAZolam (XANAX) 1 MG tablet TAKE 1 MG BY MOUTH 4 TIMES DAILY AS NEEDED FOR ANXIETY  3  . Dulaglutide (TRULICITY) 0.75 MG/0.5ML SOPN Trulicity 0.75 mg/0.5 mL subcutaneous pen injector    . DULoxetine (CYMBALTA) 60 MG  capsule Take 120 mg by mouth at bedtime.     No facility-administered medications prior to visit.    PAST MEDICAL HISTORY: Past Medical History:  Diagnosis Date  . Anemia   . Anxiety    takes xanax daily  . Anxiety disorder   . Depression, major    takes wellbutrin and celexa daily  . Diabetes mellitus    type 2   . Fibromyalgia   . GERD (gastroesophageal reflux disease)    doesn't take any meds  . H/O hiatal hernia   . Hyperlipidemia   .  Hypertension    takes Lisinopril and Diovan daily  . Hyperthyroidism    yrs ago but doesn't require meds for this  . Immune deficiency disorder (HCC)    pt states she has no clue what this is  . Insomnia   . Joint pain   . Joint swelling   . PTSD (post-traumatic stress disorder)   . PTSD (post-traumatic stress disorder)   . PVD (peripheral vascular disease) (HCC)   . Sleep apnea     PAST SURGICAL HISTORY: Past Surgical History:  Procedure Laterality Date  . ABDOMINAL AORTAGRAM N/A 08/10/2011   Procedure: ABDOMINAL Ronny FlurryAORTAGRAM;  Surgeon: Sherren Kernsharles E Fields, MD;  [facility];  Service: Cardiovascular;  Laterality: N/A;  . AMPUTATION     lft foot toes 1.2.3  . AMPUTATION  01/07/2012   Procedure: AMPUTATION RAY;  Surgeon: Sherren Kernsharles E Fields, MD;  [facility];  Service: Vascular;  Laterality: Right;  4TH & 5TH toes  . aortogram    . CHOLECYSTECTOMY  1990  . COLONOSCOPY    . DILATION AND CURETTAGE OF UTERUS    . FEMORAL BYPASS  04/29/2008   left  . FEMORAL-POPLITEAL BYPASS GRAFT  08/27/2011   Procedure: BYPASS GRAFT FEMORAL-POPLITEAL ARTERY;  Surgeon: Sherren Kernsharles E Fields, MD;  [facility];  Service: Vascular;  Laterality: Right;  . FEMORAL-POPLITEAL BYPASS GRAFT  08/29/2011   Procedure: BYPASS GRAFT FEMORAL-POPLITEAL ARTERY;  Surgeon: Sherren Kernsharles E Fields, MD;  [facility];  Service: Vascular;  Laterality: Right;  Vein Patch Angionplasty and Thrombectomy   . GASTRIC ROUX-EN-Y N/A 09/02/2017   Procedure: LAPAROSCOPIC ROUX-EN-Y GASTRIC BYPASS WITH UPPER ENDOSCOPY AND ERAS PATHWAY;  Surgeon: Kinsinger, De BlanchLuke Aaron, MD;  Location: [facility] ORS;  Service: General;  Laterality: N/A;  . INTRAOPERATIVE ARTERIOGRAM  08/29/2011   Procedure: INTRA OPERATIVE ARTERIOGRAM;  Surgeon: Sherren Kernsharles E Fields, MD;  [facility];  Service: Vascular;  Laterality: Right;  . LOWER EXTREMITY ANGIOGRAM N/A 08/10/2011   Procedure: LOWER EXTREMITY ANGIOGRAM;  Surgeon: Sherren Kernsharles E Fields, MD;  [facility];   Service: Cardiovascular;  Laterality: N/A;  . TOE AMPUTATION  07/12/2008    left foot toes 1,2,3  . TUBAL LIGATION    . VASCULAR SURGERY      FAMILY HISTORY: Family History  Problem Relation Age of Onset  . Diabetes Mother   . Hyperlipidemia Mother   . Hypertension Mother   . Diabetes Father   . Heart disease Father        before age 54  . Hypertension Father   . Hyperlipidemia Father   . Hypertension Sister   . Diabetes Brother     SOCIAL HISTORY: Social History   Socioeconomic History  . Marital status: Divorced    Spouse name: Not on file  . Number of children: Not on file  . Years of education: Not on file  . Highest education level: Not on file  Occupational History  . Not on file  Tobacco Use  . Smoking status: Former Smoker    Packs/day: 1.00    Years: 10.00    Pack years: 10.00    Types: Cigarettes    Quit date: 07/25/2008    Years since quitting: 10.5  . Smokeless tobacco: Never Used  . Tobacco comment: quit 24yrs ago  Substance and Sexual Activity  . Alcohol use: No  . Drug use: No  . Sexual activity: Not Currently  Other Topics Concern  . Not on file  Social History Narrative  . Not on file   Social Determinants of Health   Financial Resource Strain:   . Difficulty of Paying Living Expenses: Not on file  Food Insecurity:   . Worried About Programme researcher, broadcasting/film/video in the Last Year: Not on file  . Ran Out of Food in the Last Year: Not on file  Transportation Needs:   . Lack of Transportation (Medical): Not on file  . Lack of Transportation (Non-Medical): Not on file  Physical Activity:   . Days of Exercise per Week: Not on file  . Minutes of Exercise per Session: Not on file  Stress:   . Feeling of Stress : Not on file  Social Connections:   . Frequency of Communication with Friends and Family: Not on file  . Frequency of Social Gatherings with Friends and Family: Not on file  . Attends Religious Services: Not on file  . Active Member of Clubs  or Organizations: Not on file  . Attends Banker Meetings: Not on file  . Marital Status: Not on file  Intimate Partner Violence:   . Fear of Current or Ex-Partner: Not on file  . Emotionally Abused: Not on file  . Physically Abused: Not on file  . Sexually Abused: Not on file      PHYSICAL EXAM  Vitals:   02/25/19 1051  BP: (!) 140/93  Pulse: 76  Temp: (!) 96.9 F (36.1 C)  Weight: 194 lb 6.4 oz (88.2 kg)  Height:  (1.702 m)   Body mass index is 30.45 kg/m.  Generalized: Well developed, in no acute distress  Chest: Lungs clear to auscultation bilaterally  Neurological examination  Mentation: Alert oriented to time, place, history taking. Follows all commands speech and language fluent Cranial nerve II-XII: Extraocular movements were full, visual field were full on confrontational test Head turning and shoulder shrug  were normal and symmetric. Motor: The motor testing reveals 5 over 5 strength of all 4 extremities. Good symmetric motor tone is noted throughout.  Sensory: Sensory testing is intact to soft touch on all 4 extremities. No evidence of extinction is noted.  Gait and station: Gait is normal.    DIAGNOSTIC DATA (LABS, IMAGING, TESTING) - I reviewed patient records, labs, notes, testing and imaging myself where available.  Lab Results  Component Value Date   WBC 9.6 09/03/2017   HGB 12.5 09/03/2017   HCT 38.4 09/03/2017   MCV 91.4 09/03/2017   PLT 331 09/03/2017      Component Value Date/Time   NA 143 10/16/2017 1135   K 3.0 (L) 10/16/2017 1135   CL 108 10/16/2017 1135   CO2 24 10/16/2017 1135   GLUCOSE 117 (H) 10/16/2017 1135   BUN 30 (H) 10/16/2017 1135   CREATININE 0.83 10/16/2017 1135   CALCIUM 8.3 (L) 10/16/2017 1135   PROT 7.2 10/16/2017 1135   ALBUMIN 3.3 (L) 10/16/2017 1135   AST 19 10/16/2017  1135   ALT 22 10/16/2017 1135   ALKPHOS 78 10/16/2017 1135   BILITOT 1.1 10/16/2017 1135   GFRNONAA >60 10/16/2017 1135    GFRAA >60 10/16/2017 1135   Lab Results  Component Value Date   CHOL 197 06/16/2014   HDL 30 (L) 06/16/2014   LDLCALC 125 (H) 06/16/2014   TRIG 211 (H) 06/16/2014   CHOLHDL 6.6 06/16/2014   Lab Results  Component Value Date   HGBA1C 12.4 (H) 08/28/2017   No results found for: VITAMINB12 Lab Results  Component Value Date   TSH 0.605 02/23/2008      ASSESSMENT AND PLAN 53 y.o. year old female  has a past medical history of Anemia, Anxiety, Anxiety disorder, Depression, major, Diabetes mellitus, Fibromyalgia, GERD (gastroesophageal reflux disease), H/O hiatal hernia, Hyperlipidemia, Hypertension, Hyperthyroidism, Immune deficiency disorder ([place]), Insomnia, Joint pain, Joint swelling, PTSD (post-traumatic stress disorder), PTSD (post-traumatic stress disorder), PVD (peripheral vascular disease) ([place]), and Sleep apnea. here with:  1. Obstructive sleep apnea on CPAP  Patient CPAP download shows suboptimal compliance and residual AHI remains elevated.  I will increase her pressure 5 to 15 cm of water.  She is encouraged to continue using CPAP nightly and greater than 4 hours each night.  She is advised that if her symptoms worsen or she develops new symptoms she should let us know.  She will follow-up in 6 months or sooner if needed  I spent 15 minutes with the patient. 50% of this time was spent reviewing CPAP download   Ward Givens, MSN, NP-C 02/25/2019, 10:52 AM [facility] [address] [place], [place] [address] [phone]  I reviewed the above note and documentation by the Nurse Practitioner and agree with the history, exam, assessment and plan as outlined above. I was available for consultation. Star Age, MD, PhD [facility])

## 2019-03-02 ENCOUNTER — Encounter (HOSPITAL_COMMUNITY): Payer: Self-pay

## 2019-03-19 ENCOUNTER — Other Ambulatory Visit: Payer: Self-pay

## 2019-03-19 ENCOUNTER — Telehealth (HOSPITAL_COMMUNITY): Payer: Self-pay

## 2019-03-19 DIAGNOSIS — I739 Peripheral vascular disease, unspecified: Secondary | ICD-10-CM

## 2019-03-19 NOTE — Progress Notes (Signed)
History of Present Illness:  Patient is a 54 y.o. year old female who presents for surveillance s/p right femoral to above-knee popliteal bypass June 2013.  This was done with a vein.  The indication was for rest pain.  She subsequently underwent amputation of toes 4 and 5 on the right foot.  She had previously undergone a left femoral endarterectomy and left femoral-above-knee popliteal bypass in 2010.   She denise symptoms of claudication and rest pain at this time.  She is not taking Aspirin or statins at this time.  She has an allergy to statins.    Past Medical History:  Diagnosis Date  . Anemia   . Anxiety    takes xanax daily  . Anxiety disorder   . Depression, major    takes wellbutrin and celexa daily  . Diabetes mellitus    type 2   . Fibromyalgia   . GERD (gastroesophageal reflux disease)    doesn't take any meds  . H/O hiatal hernia   . Hyperlipidemia   . Hypertension    takes Lisinopril and Diovan daily  . Hyperthyroidism    yrs ago but doesn't require meds for this  . Immune deficiency disorder ([place])    pt states she has no clue what this is  . Insomnia   . Joint pain   . Joint swelling   . PTSD (post-traumatic stress disorder)   . PTSD (post-traumatic stress disorder)   . PVD (peripheral vascular disease) ([place])   . Sleep apnea     Past Surgical History:  Procedure Laterality Date  . ABDOMINAL AORTAGRAM N/A 08/10/2011   Procedure: ABDOMINAL [name];  Surgeon: Elam Dutch, MD;  [facility] LAB;  Service: Cardiovascular;  Laterality: N/A;  . AMPUTATION     lft foot toes 1.2.3  . AMPUTATION  01/07/2012   Procedure: AMPUTATION RAY;  Surgeon: Elam Dutch, MD;  Location[facility] OR;  Service: Vascular;  Laterality: Right;  4TH & 5TH toes  . aortogram    . CHOLECYSTECTOMY  1990  . COLONOSCOPY    . DILATION AND CURETTAGE OF UTERUS    . FEMORAL BYPASS  04/29/2008   left  . FEMORAL-POPLITEAL BYPASS GRAFT  08/27/2011   Procedure: BYPASS GRAFT  FEMORAL-POPLITEAL ARTERY;  Surgeon: Elam Dutch, MD;  Location: [place];  Service: Vascular;  Laterality: Right;  . FEMORAL-POPLITEAL BYPASS GRAFT  08/29/2011   Procedure: BYPASS GRAFT FEMORAL-POPLITEAL ARTERY;  Surgeon: Elam Dutch, MD;  Location: [facility] OR;  Service: Vascular;  Laterality: Right;  Vein Patch Angionplasty and Thrombectomy   . GASTRIC ROUX-EN-Y N/A 09/02/2017   Procedure: LAPAROSCOPIC ROUX-EN-Y GASTRIC BYPASS WITH UPPER ENDOSCOPY AND ERAS PATHWAY;  Surgeon: Kinsinger, Arta Bruce, MD;  Location: [facility] ORS;  Service: General;  Laterality: N/A;  . INTRAOPERATIVE ARTERIOGRAM  08/29/2011   Procedure: INTRA OPERATIVE ARTERIOGRAM;  Surgeon: Elam Dutch, MD;  Location: [place];  Service: Vascular;  Laterality: Right;  . LOWER EXTREMITY ANGIOGRAM N/A 08/10/2011   Procedure: LOWER EXTREMITY ANGIOGRAM;  Surgeon: Elam Dutch, MD;  Location: [facility] CATH LAB;  Service: Cardiovascular;  Laterality: N/A;  . TOE AMPUTATION  07/12/2008    left foot toes 1,2,3  . TUBAL LIGATION    . VASCULAR SURGERY      ROS:   General:  No weight loss, Fever, chills  HEENT: No recent headaches, no nasal bleeding, no visual changes, no sore throat  Neurologic: No dizziness, blackouts, seizures. No recent symptoms of  stroke or mini- stroke. No recent episodes of slurred speech, or temporary blindness.  Cardiac: No recent episodes of chest pain/pressure, no shortness of breath at rest.  No shortness of breath with exertion.  Denies history of atrial fibrillation or irregular heartbeat  Vascular: No history of rest pain in feet.  No history of claudication.  No history of non-healing ulcer, No history of DVT   Pulmonary: No home oxygen, no productive cough, no hemoptysis,  No asthma or wheezing  Musculoskeletal:  [ ]  Arthritis, [ ]  Low back pain,  [ ]  Joint pain  Hematologic:No history of hypercoagulable state.  No history of easy bleeding.  No history of anemia  Gastrointestinal: No hematochezia or melena,   No gastroesophageal reflux, no trouble swallowing  Urinary: [ ]  chronic Kidney disease, [ ]  on HD - [ ]  MWF or [ ]  TTHS, [ ]  Burning with urination, [ ]  Frequent urination, [ ]  Difficulty urinating;   Skin: No rashes  Psychological: No history of anxiety,  No history of depression  Social History Social History   Tobacco Use  . Smoking status: Former Smoker    Packs/day: 1.00    Years: 10.00    Pack years: 10.00    Types: Cigarettes    Quit date: 07/25/2008    Years since quitting: 10.6  . Smokeless tobacco: Never Used  . Tobacco comment: quit 77yrs ago  Substance Use Topics  . Alcohol use: No  . Drug use: No    Family History Family History  Problem Relation Age of Onset  . Diabetes Mother   . Hyperlipidemia Mother   . Hypertension Mother   . Diabetes Father   . Heart disease Father        before age 28  . Hypertension Father   . Hyperlipidemia Father   . Hypertension Sister   . Diabetes Brother     Allergies  Allergies  Allergen Reactions  . Amoxicillin-Pot Clavulanate Shortness Of Breath    Has patient had a PCN reaction causing immediate rash, facial/tongue/throat swelling, SOB or lightheadedness with hypotension: Yes Has patient had a PCN reaction causing severe rash involving mucus membranes or skin necrosis: No Has patient had a PCN reaction that required hospitalization: Yes Has patient had a PCN reaction occurring within the last 10 years: No If all of the above answers are "NO", then may proceed with Cephalosporin use.   . Codeine Anaphylaxis  . Darvocet [Propoxyphene N-Acetaminophen] Anaphylaxis    Plain Tylenol ok  . Oxycodone Anaphylaxis  . Statins Other (See Comments)    Joint pain  . Propoxyphene      Current Outpatient Medications  Medication Sig Dispense Refill  . ALPRAZolam (XANAX) 1 MG tablet TAKE 1 MG BY MOUTH 4 TIMES DAILY AS NEEDED FOR ANXIETY  3  . Dulaglutide (TRULICITY) 0.75 MG/0.5ML SOPN Trulicity 0.75 mg/0.5 mL subcutaneous  pen injector    . DULoxetine (CYMBALTA) 60 MG capsule Take 120 mg by mouth at bedtime.     No current facility-administered medications for this visit.    Physical Examination  Vitals:   03/20/19 0916  BP: 133/87  Pulse: 78  Resp: 16  Temp: (!) 97 F (36.1 C)  TempSrc: Temporal  SpO2: 97%  Weight: 197 lb (89.4 kg)  Height: 5\' 7"  (1.702 m)    Body mass index is 30.85 kg/m.  General:  Alert and oriented, no acute distress HEENT: Normal Neck: No bruit or JVD Pulmonary: Clear to auscultation bilaterally Cardiac: Regular Rate  and Rhythm without murmur Abdomen: Soft, non-tender, non-distended, no mass, no scars Skin: No rash Extremity Pulses:  2+ radial, brachial, femoral, weak dorsalis pedis, posterior tibial pulses bilaterally Musculoskeletal: No deformity or edema, well healed amputations B feet.  Neurologic: Upper and lower extremity motor 5/5 and symmetric  DATA:     ABI Findings: +---------+------------------+-----+---------+--------+ Right    Rt Pressure (mmHg)IndexWaveform Comment  +---------+------------------+-----+---------+--------+ Brachial 118                                      +---------+------------------+-----+---------+--------+ ATA      160               1.22                   +---------+------------------+-----+---------+--------+ PTA      158               1.21 triphasic         +---------+------------------+-----+---------+--------+ DP                              triphasic         +---------+------------------+-----+---------+--------+ Great Toe111               0.85                   +---------+------------------+-----+---------+--------+  +---------+------------------+-----+---------+----------+ Left     Lt Pressure (mmHg)IndexWaveform Comment    +---------+------------------+-----+---------+----------+ Brachial 131                                         +---------+------------------+-----+---------+----------+ ATA      151               1.15                     +---------+------------------+-----+---------+----------+ PTA      153               1.17 triphasic           +---------+------------------+-----+---------+----------+ DP                              triphasic           +---------+------------------+-----+---------+----------+ Great Toe                                amputation +---------+------------------+-----+---------+----------+  +-------+-----------+-----------+------------+------------+ ABI/TBIToday's ABIToday's TBIPrevious ABIPrevious TBI +-------+-----------+-----------+------------+------------+ Right  1.22       0.85       1.24        0.89         +-------+-----------+-----------+------------+------------+ Left   1.17       amputation 1.18        amputation   +-------+-----------+-----------+------------+------------+    Bilateral ABIs appear essentially unchanged compared to prior study on 07/18/2017.   Summary: Right: Resting right ankle-brachial index is within normal range. No evidence of significant right lower extremity arterial disease. The right toe-brachial index is normal. RT great toe pressure = 111 mmHg.  Left: Resting left ankle-brachial index is within normal range. No evidence of significant left lower extremity arterial  disease.   ASSESSMENT:  S/P Patent bilateral femoral above-knee popliteal bypass graft was no significant recurrent stenosis.  Currently asymptomatic.  PLAN:  The patient will return for graft surveillance scan and ABIs in 1 year.  She will return sooner if she develops recurrent symptoms of nonhealing wounds or claudication or rest pain.  She agrees to the plan of care. She will start taking 81 mg Aspirin daily and ask her primary care physician to advice her about hyperlipidemia prevention and medications other than Statins for  preventative.  Mosetta Pigeon PA-C [facility] Office: [phone]  MD in clinic [place]

## 2019-03-19 NOTE — Telephone Encounter (Signed)

## 2019-03-20 ENCOUNTER — Ambulatory Visit (HOSPITAL_COMMUNITY)
Admission: RE | Admit: 2019-03-20 | Discharge: 2019-03-20 | Disposition: A | Discharge status: Home / Self Care | Payer: 59 | Source: Ambulatory Visit | Attending: Vascular Surgery | Admitting: Vascular Surgery

## 2019-03-20 ENCOUNTER — Other Ambulatory Visit: Payer: Self-pay

## 2019-03-20 ENCOUNTER — Ambulatory Visit: Payer: 59 | Admitting: Physician Assistant

## 2019-03-20 ENCOUNTER — Ambulatory Visit (INDEPENDENT_AMBULATORY_CARE_PROVIDER_SITE_OTHER)
Admission: RE | Admit: 2019-03-20 | Discharge: 2019-03-20 | Disposition: A | Discharge status: Home / Self Care | Payer: 59 | Source: Ambulatory Visit | Attending: Vascular Surgery | Admitting: Vascular Surgery

## 2019-03-20 VITALS — BP 133/87 | HR 78 | Temp 97.0°F | Resp 16 | Ht 67.0 in | Wt 197.0 lb

## 2019-03-20 DIAGNOSIS — I739 Peripheral vascular disease, unspecified: Secondary | ICD-10-CM

## 2019-03-24 ENCOUNTER — Other Ambulatory Visit: Payer: Self-pay | Admitting: *Deleted

## 2019-03-24 DIAGNOSIS — I739 Peripheral vascular disease, unspecified: Secondary | ICD-10-CM

## 2019-04-06 ENCOUNTER — Other Ambulatory Visit (HOSPITAL_COMMUNITY): Payer: 59

## 2019-04-09 ENCOUNTER — Ambulatory Visit: Admit: 2019-04-09 | Payer: 59 | Admitting: Surgery

## 2019-04-09 SURGERY — THYROIDECTOMY
Anesthesia: General

## 2019-05-21 NOTE — Progress Notes (Signed)
DUE TO COVID-19 ONLY ONE VISITOR IS ALLOWED TO COME WITH YOU AND STAY IN THE WAITING ROOM ONLY DURING PRE OP AND PROCEDURE DAY OF SURGERY. THE 1 VISITOR MAY VISIT WITH YOU AFTER SURGERY IN YOUR PRIVATE ROOM DURING VISITING HOURS ONLY!  YOU NEED TO HAVE A COVID 19 TEST ON___3/23/21____ @__0855am_____ , THIS TEST MUST BE DONE BEFORE SURGERY, COME  Kelly Barber , [id number].  ([place]) ONCE YOUR COVID TEST IS COMPLETED, PLEASE BEGIN THE QUARANTINE INSTRUCTIONS AS OUTLINED IN YOUR HANDOUT.                Kelly Barber  05/21/2019   Your procedure is scheduled on:  05/26/2019   Report to [facility] Main  Entrance   Report to admitting at    0730AM     Call this number if you have problems the morning of surgery [phone]    Remember: Do not eat food or drink liquids :After Midnight. BRUSH YOUR TEETH MORNING OF SURGERY AND RINSE YOUR MOUTH OUT, NO CHEWING GUM CANDY OR MINTS.     Take these medicines the morning of surgery with A SIP OF WATER:  Xanax if needed, Adderall  DO NOT TAKE ANY DIABETIC MEDICATIONS DAY OF YOUR SURGERY                               You may not have any metal on your body including hair pins and              piercings  Do not wear jewelry, make-up, lotions, powders or perfumes, deodorant             Do not wear nail polish on your fingernails.  Do not shave  48 hours prior to surgery.                Do not bring valuables to the hospital. [place].  Contacts, dentures or bridgework may not be worn into surgery.  Leave suitcase in the car. After surgery it may be brought to your room.     Patients discharged the day of surgery will not be allowed to drive home. IF YOU ARE HAVING SURGERY AND GOING HOME THE SAME DAY, YOU MUST HAVE AN ADULT TO DRIVE YOU HOME AND BE WITH YOU FOR 24 HOURS. YOU MAY GO HOME BY TAXI OR UBER OR ORTHERWISE, BUT AN ADULT MUST ACCOMPANY YOU HOME AND  STAY WITH YOU FOR 24 HOURS.  Name and phone number of your driver:                Please read over the following fact sheets you were given: _____________________________________________________________________             [facility] - Preparing for Surgery Before surgery, you can play an important role.  Because skin is not sterile, your skin needs to be as free of germs as possible.  You can reduce the number of germs on your skin by washing with CHG (chlorahexidine gluconate) soap before surgery.  CHG is an antiseptic cleaner which kills germs and bonds with the skin to continue killing germs even after washing. Please DO NOT use if you have an allergy to CHG or antibacterial soaps.  If your skin becomes reddened/irritated stop using the CHG and inform your  nurse when you arrive at Short Stay. Do not shave (including legs and underarms) for at least 48 hours prior to the first CHG shower.  You may shave your face/neck. Please follow these instructions carefully:  1.  Shower with CHG Soap the night before surgery and the  morning of Surgery.  2.  If you choose to wash your hair, wash your hair first as usual with your  normal  shampoo.  3.  After you shampoo, rinse your hair and body thoroughly to remove the  shampoo.                           4.  Use CHG as you would any other liquid soap.  You can apply chg directly  to the skin and wash                       Gently with a scrungie or clean washcloth.  5.  Apply the CHG Soap to your body ONLY FROM THE NECK DOWN.   Do not use on face/ open                           Wound or open sores. Avoid contact with eyes, ears mouth and genitals (private parts).                       Wash face,  Genitals (private parts) with your normal soap.             6.  Wash thoroughly, paying special attention to the area where your surgery  will be performed.  7.  Thoroughly rinse your body with warm water from the neck down.  8.  DO NOT shower/wash with your  normal soap after using and rinsing off  the CHG Soap.                9.  Pat yourself dry with a clean towel.            10.  Wear clean pajamas.            11.  Place clean sheets on your bed the night of your first shower and do not  sleep with pets. Day of Surgery : Do not apply any lotions/deodorants the morning of surgery.  Please wear clean clothes to the hospital/surgery center.  FAILURE TO FOLLOW THESE INSTRUCTIONS MAY RESULT IN THE CANCELLATION OF YOUR SURGERY PATIENT SIGNATURE_________________________________  NURSE SIGNATURE__________________________________  ________________________________________________________________________

## 2019-05-22 ENCOUNTER — Encounter (HOSPITAL_COMMUNITY)
Admission: RE | Admit: 2019-05-22 | Discharge: 2019-05-22 | Disposition: A | Discharge status: Home / Self Care | Payer: 59 | Source: Ambulatory Visit | Attending: Surgery | Admitting: Surgery

## 2019-05-22 ENCOUNTER — Encounter (HOSPITAL_COMMUNITY): Payer: Self-pay

## 2019-05-22 ENCOUNTER — Other Ambulatory Visit: Payer: Self-pay

## 2019-05-22 HISTORY — DX: Other complications of anesthesia, initial encounter: T88.59XA

## 2019-05-25 ENCOUNTER — Encounter (HOSPITAL_COMMUNITY)
Admission: RE | Admit: 2019-05-25 | Discharge: 2019-05-25 | Disposition: A | Discharge status: Home / Self Care | Payer: 59 | Source: Ambulatory Visit | Attending: Surgery | Admitting: Surgery

## 2019-05-25 ENCOUNTER — Other Ambulatory Visit: Payer: Self-pay

## 2019-05-25 ENCOUNTER — Ambulatory Visit (HOSPITAL_COMMUNITY)
Admission: RE | Admit: 2019-05-25 | Discharge: 2019-05-25 | Disposition: A | Discharge status: Home / Self Care | Payer: 59 | Source: Ambulatory Visit | Attending: Anesthesiology | Admitting: Anesthesiology

## 2019-05-25 DIAGNOSIS — E049 Nontoxic goiter, unspecified: Secondary | ICD-10-CM | POA: Diagnosis present

## 2019-05-25 LAB — BASIC METABOLIC PANEL
Anion gap: 11 (ref 5–15)
BUN: 29 mg/dL — ABNORMAL HIGH (ref 6–20)
CO2: 26 mmol/L (ref 22–32)
Calcium: 9.1 mg/dL (ref 8.9–10.3)
Chloride: 102 mmol/L (ref 98–111)
Creatinine, Ser: 0.74 mg/dL (ref 0.44–1.00)
GFR calc Af Amer: >60 mL/min (ref >60)
GFR calc non Af Amer: >60 mL/min (ref >60)
Glucose, Bld: 246 mg/dL — ABNORMAL HIGH (ref 70–99)
Potassium: 4.3 mmol/L (ref 3.5–5.1)
Sodium: 139 mmol/L (ref 135–145)

## 2019-05-25 LAB — GLUCOSE, CAPILLARY: Glucose-Capillary: 242 mg/dL — ABNORMAL HIGH (ref 70–99)

## 2019-05-25 LAB — HEMOGLOBIN A1C
Hgb A1c MFr Bld: 9.5 % — ABNORMAL HIGH (ref 4.8–5.6)
Mean Plasma Glucose: 225.95 mg/dL

## 2019-05-25 LAB — CBC
HCT: 39.7 % (ref 36.0–46.0)
Hemoglobin: 12.5 g/dL (ref 12.0–15.0)
MCH: 29.9 pg (ref 26.0–34.0)
MCHC: 31.5 g/dL (ref 30.0–36.0)
MCV: 95 fL (ref 80.0–100.0)
Platelets: 249 10*3/uL (ref 150–400)
RBC: 4.18 MIL/uL (ref 3.87–5.11)
RDW: 12.6 % (ref 11.5–15.5)
WBC: 5.9 10*3/uL (ref 4.0–10.5)
nRBC: 0 % (ref 0.0–0.2)

## 2019-05-26 ENCOUNTER — Encounter (HOSPITAL_COMMUNITY): Payer: Self-pay | Admitting: Surgery

## 2019-05-26 ENCOUNTER — Other Ambulatory Visit (HOSPITAL_COMMUNITY)
Admission: RE | Admit: 2019-05-26 | Discharge: 2019-05-26 | Disposition: A | Discharge status: Home / Self Care | Payer: 59 | Source: Ambulatory Visit | Attending: Surgery | Admitting: Surgery

## 2019-05-26 DIAGNOSIS — G4730 Sleep apnea, unspecified: Secondary | ICD-10-CM | POA: Diagnosis not present

## 2019-05-26 DIAGNOSIS — E1151 Type 2 diabetes mellitus with diabetic peripheral angiopathy without gangrene: Secondary | ICD-10-CM | POA: Diagnosis not present

## 2019-05-26 DIAGNOSIS — Z87891 Personal history of nicotine dependence: Secondary | ICD-10-CM | POA: Diagnosis not present

## 2019-05-26 DIAGNOSIS — I10 Essential (primary) hypertension: Secondary | ICD-10-CM | POA: Diagnosis not present

## 2019-05-26 DIAGNOSIS — Z6830 Body mass index (BMI) 30.0-30.9, adult: Secondary | ICD-10-CM | POA: Diagnosis not present

## 2019-05-26 DIAGNOSIS — E0590 Thyrotoxicosis, unspecified without thyrotoxic crisis or storm: Secondary | ICD-10-CM | POA: Diagnosis not present

## 2019-05-26 DIAGNOSIS — K219 Gastro-esophageal reflux disease without esophagitis: Secondary | ICD-10-CM | POA: Diagnosis not present

## 2019-05-26 DIAGNOSIS — F329 Major depressive disorder, single episode, unspecified: Secondary | ICD-10-CM | POA: Diagnosis not present

## 2019-05-26 DIAGNOSIS — Z20822 Contact with and (suspected) exposure to covid-19: Secondary | ICD-10-CM | POA: Insufficient documentation

## 2019-05-26 DIAGNOSIS — Z9884 Bariatric surgery status: Secondary | ICD-10-CM | POA: Diagnosis not present

## 2019-05-26 DIAGNOSIS — Z8249 Family history of ischemic heart disease and other diseases of the circulatory system: Secondary | ICD-10-CM | POA: Diagnosis not present

## 2019-05-26 DIAGNOSIS — M797 Fibromyalgia: Secondary | ICD-10-CM | POA: Diagnosis not present

## 2019-05-26 DIAGNOSIS — E049 Nontoxic goiter, unspecified: Secondary | ICD-10-CM | POA: Diagnosis present

## 2019-05-26 DIAGNOSIS — Z01812 Encounter for preprocedural laboratory examination: Secondary | ICD-10-CM | POA: Insufficient documentation

## 2019-05-26 DIAGNOSIS — E7800 Pure hypercholesterolemia, unspecified: Secondary | ICD-10-CM | POA: Diagnosis not present

## 2019-05-26 DIAGNOSIS — Z8349 Family history of other endocrine, nutritional and metabolic diseases: Secondary | ICD-10-CM | POA: Diagnosis not present

## 2019-05-26 DIAGNOSIS — F4310 Post-traumatic stress disorder, unspecified: Secondary | ICD-10-CM | POA: Diagnosis not present

## 2019-05-26 LAB — SARS CORONAVIRUS 2 (TAT 6-24 HRS): SARS Coronavirus 2: NEGATIVE

## 2019-05-26 NOTE — H&P (Signed)
General Surgery [facility]  RUSSELL ENGELSTAD DOB: 09/05/1965 Single / Language: Kelly Barber / Race: Black or African American Female   History of Present Illness  The patient is a 54 year old female who presents with a complaint of Enlarged thyroid.  CHIEF COMPLAINT: thyroid goiter, hyperthyroidism  Patient is referred by Dr. Marlene Lard for surgical evaluation and management of large thyroid goiter with subclinical hyperthyroidism. Patient has had a known thyroid goiter dating back many years. She has developed mild to moderate compressive symptoms including dysphagia and globus sensation. She is having intermittent episodes of hoarseness. Patient has subclinical hyperthyroidism and is recently been taking methimazole. Her most recent TSH level was 0.67. Patient had undergone a nuclear medicine thyroid uptake scan in October 2020 showing uptake within normal limits although subjectively decreased. Patient also underwent a CT scan of the neck in November 2020 showing diffuse enlargement of the thyroid gland with the left lobe measuring 9.1 cm and right lobe measuring 7.6 cm. This showed interval enlargement compared to a prior study in 2007. There is also intrathoracic extension of the left thyroid lobe. Patient had previously taken levothyroxine many years ago. She denies any prior head or neck surgery. There is a family history of Graves' disease and the patient's mother and unknown type of medical thyroid disease in the patient's brother and sister, according to the patient. Patient is known to our practice from previous bariatric surgery and has had significant weight loss. She now presents for consideration for thyroidectomy for management of thyroid goiter with intrathoracic extension and subclinical hyperthyroidism.   Problem List/Past Medical DIABETES MELLITUS, CONTROLLED (E11.9)  HYPERTENSION, BENIGN (I10)  PTSD (POST-TRAUMATIC STRESS DISORDER)  (F43.10)  OBESITY (E66.9)  PERIPHERAL ARTERIAL DISEASE (I73.9)  SUBSTERNAL GOITER (E04.9)  SUBCLINICAL HYPERTHYROIDISM (E05.90)  FATIGUE (R53.83)  MORBID OBESITY (E66.01) [11/28/2016]: S/P GASTRIC BYPASS (Z98.84) [09/16/2017]: TAKING MULTIPLE MEDICATIONS FOR CHRONIC DISEASE (R69) [10/30/2017]: MALNUTRITION FOLLOWING GASTROINTESTINAL SURGERY (K91.2) [10/30/2017]:  Past Surgical History  Bypass Surgery for Poor Blood Flow to Legs  Colon Polyp Removal - Colonoscopy  Foot Surgery  Bilateral. Gallbladder Surgery - Open  Gastric Bypass  Mammoplasty; Reduction  Bilateral.  Diagnostic Studies History  Colonoscopy  within last year >10 years ago Mammogram  within last year >3 years ago Pap Smear  1-5 years ago  Allergies Percocet *ANALGESICS - OPIOID*  Hives.  Medication History Amphetamine-Dextroamphetamine (20MG  Tablet, Oral) Active. Trulicity (0.75MG /0.5ML Soln Pen-inj, Subcutaneous) Active. methIMAzole (5MG  Tablet, Oral) Active. Xanax (0.5MG  Tablet, Oral) Active. traZODone HCl (150MG  Tablet, Oral) Active. Multivitamin Adult (Oral) Active. Medications Reconciled  Social History  Caffeine use  Carbonated beverages, Tea. No alcohol use  No drug use  Tobacco use  Former smoker.  Family History Alcohol Abuse  Father. Arthritis  Father, Mother. Depression  Brother, Daughter, Father, Mother, Sister, Son. Diabetes Mellitus  Brother, Father, Mother, Sister, Son. Heart Disease  Father. Hypertension  Father, Mother, Sister. Thyroid problems  Brother, Mother. Heart disease in female family member before age 59  Heart disease in female family member before age 20   Pregnancy / Birth History Age at menarche  10 years. Age of menopause  23-55 Gravida  3 4 Irregular periods  Maternal age  55-25 49-20 Para  2 Contraceptive History  Contraceptive implant.  Other Problems  Anxiety Disorder  Back Pain  Bladder Problems   Depression  Diabetes Mellitus  High blood pressure  Hypercholesterolemia  Sleep Apnea  Thyroid Disease  Vascular Disease  Cholelithiasis  Gastroesophageal  Reflux Disease     Review of Systems General Present- Fatigue and Weight Gain. Not Present- Appetite Loss, Chills, Fever, Night Sweats and Weight Loss. Skin Present- Dryness. Not Present- Change in Wart/Mole, Hives, Jaundice, New Lesions, Non-Healing Wounds, Rash and Ulcer. HEENT Present- Hoarseness, Visual Disturbances and Wears glasses/contact lenses. Not Present- Earache, Hearing Loss, Nose Bleed, Oral Ulcers, Ringing in the Ears, Seasonal Allergies, Sinus Pain, Sore Throat and Yellow Eyes. Respiratory Present- Snoring. Not Present- Bloody sputum, Chronic Cough, Difficulty Breathing and Wheezing. Breast Not Present- Breast Mass, Breast Pain, Nipple Discharge and Skin Changes. Cardiovascular Present- Swelling of Extremities. Not Present- Chest Pain, Difficulty Breathing Lying Down, Leg Cramps, Palpitations, Rapid Heart Rate and Shortness of Breath. Gastrointestinal Present- Difficulty Swallowing and Hemorrhoids. Not Present- Abdominal Pain, Bloating, Bloody Stool, Change in Bowel Habits, Chronic diarrhea, Constipation, Excessive gas, Gets full quickly at meals, Indigestion, Nausea, Rectal Pain and Vomiting. Musculoskeletal Present- Joint Stiffness and Swelling of Extremities. Not Present- Back Pain, Joint Pain, Muscle Pain and Muscle Weakness. Neurological Present- Numbness, Tingling and Trouble walking. Not Present- Decreased Memory, Fainting, Headaches, Seizures, Tremor and Weakness. Psychiatric Present- Anxiety, Change in Sleep Pattern, Depression and Fearful. Not Present- Bipolar and Frequent crying. Endocrine Present- Cold Intolerance. Not Present- Excessive Hunger, Hair Changes, Heat Intolerance, Hot flashes and New Diabetes. Hematology Present- Persistent Infections. Not Present- Blood Thinners, Easy Bruising, Excessive  bleeding, Gland problems and HIV.  Vitals Weight: 192.13 lb Height: 66.5in Body Surface Area: 1.98 m Body Mass Index: 30.54 kg/m  Temp.: 98.32F  Pulse: 81 (Regular)  P.OX: 99% (Room air) BP: 170/90 (Sitting, Left Arm, Standard)   Physical Exam  GENERAL APPEARANCE Development: normal Nutritional status: normal Gross deformities: none  SKIN Rash, lesions, ulcers: none Induration, erythema: none Nodules: none palpable  EYES Conjunctiva and lids: normal Pupils: equal and reactive Iris: normal bilaterally  EARS, NOSE, MOUTH, THROAT External ears: no lesion or deformity External nose: no lesion or deformity Hearing: grossly normal Lips: Patient is wearing a mask Dentition:Patient is wearing a mask Oral mucosa:Patient is wearing a mask  NECK Symmetric: no Trachea: midline Thyroid: Thyroid gland is diffusely nodular, relatively soft, and asymmetric. Right thyroid lobe is situated more anteriorly and superiorly. It measures approximately 8 cm in greatest dimension. Left thyroid lobe is located more deeply and inferiorly extending beneath the left clavicle. There is no associated lymphadenopathy.  CHEST Respiratory effort: normal Retraction or accessory muscle use: no Breath sounds: normal bilaterally Rales, rhonchi, wheeze: none  CARDIOVASCULAR Auscultation: regular rhythm, normal rate Murmurs: none Pulses: carotid and radial pulse 2+ palpable Lower extremity edema: none Lower extremity varicosities: none  MUSCULOSKELETAL Station and gait: normal Digits and nails: no clubbing or cyanosis Muscle strength: grossly normal all extremities Range of motion: grossly normal all extremities Deformity: none  LYMPHATIC Cervical: none palpable Supraclavicular: none palpable  PSYCHIATRIC Oriented to person, place, and time: yes Mood and affect: normal for situation Judgment and insight: appropriate for situation    Assessment & Plan   THYROID GOITER  (E04.9) SUBSTERNAL GOITER (E04.9) SUBCLINICAL HYPERTHYROIDISM (E05.90)  Patient is referred by her endocrinologist, Dr. Gayla Medicus, for surgical evaluation and management of thyroid goiter with intrathoracic extension and subclinical hyperthyroidism. Patient is provided with written literature on thyroid surgery to review at home.  Patient has a very large thyroid goiter which extends into the anterior mediastinum on the left side. She has mild to moderate compressive symptoms. She also has subclinical hyperthyroidism and has been previously treated with methimazole. She presents today to  discuss total thyroidectomy for management. We discussed the risk and benefits of the procedure including the risk of recurrent laryngeal nerve injury and injury to parathyroid glands. We discussed the hospital stay to be anticipated. We discussed the location and size of the surgical incision. We discussed her postoperative recovery. We discussed the need for lifelong thyroid hormone replacement. The patient understands and wishes to proceed with surgery in the near future. We will make arrangements at a time convenient for the patient.  The risks and benefits of the procedure have been discussed at length with the patient. The patient understands the proposed procedure, potential alternative treatments, and the course of recovery to be expected. All of the patient's questions have been answered at this time. The patient wishes to proceed with surgery.   Armandina Gemma, MD [facility] Office: [phone]

## 2019-05-29 ENCOUNTER — Other Ambulatory Visit: Payer: Self-pay

## 2019-05-29 ENCOUNTER — Ambulatory Visit (HOSPITAL_COMMUNITY): Payer: 59 | Admitting: Registered Nurse

## 2019-05-29 ENCOUNTER — Ambulatory Visit (HOSPITAL_COMMUNITY)
Admission: RE | Admit: 2019-05-29 | Discharge: 2019-05-30 | Disposition: A | Discharge status: Home / Self Care | Payer: 59 | Source: Ambulatory Visit | Attending: Surgery | Admitting: Surgery

## 2019-05-29 ENCOUNTER — Encounter (HOSPITAL_COMMUNITY)
Admission: RE | Disposition: A | Discharge status: Home / Self Care | Payer: Self-pay | Source: Ambulatory Visit | Attending: Surgery

## 2019-05-29 ENCOUNTER — Ambulatory Visit (HOSPITAL_COMMUNITY): Payer: 59 | Admitting: Physician Assistant

## 2019-05-29 ENCOUNTER — Encounter (HOSPITAL_COMMUNITY): Payer: Self-pay | Admitting: Surgery

## 2019-05-29 DIAGNOSIS — F4310 Post-traumatic stress disorder, unspecified: Secondary | ICD-10-CM | POA: Insufficient documentation

## 2019-05-29 DIAGNOSIS — E0590 Thyrotoxicosis, unspecified without thyrotoxic crisis or storm: Secondary | ICD-10-CM | POA: Insufficient documentation

## 2019-05-29 DIAGNOSIS — E1151 Type 2 diabetes mellitus with diabetic peripheral angiopathy without gangrene: Secondary | ICD-10-CM | POA: Insufficient documentation

## 2019-05-29 DIAGNOSIS — F329 Major depressive disorder, single episode, unspecified: Secondary | ICD-10-CM | POA: Insufficient documentation

## 2019-05-29 DIAGNOSIS — Z8249 Family history of ischemic heart disease and other diseases of the circulatory system: Secondary | ICD-10-CM | POA: Insufficient documentation

## 2019-05-29 DIAGNOSIS — Z20822 Contact with and (suspected) exposure to covid-19: Secondary | ICD-10-CM | POA: Insufficient documentation

## 2019-05-29 DIAGNOSIS — E7800 Pure hypercholesterolemia, unspecified: Secondary | ICD-10-CM | POA: Insufficient documentation

## 2019-05-29 DIAGNOSIS — Z87891 Personal history of nicotine dependence: Secondary | ICD-10-CM | POA: Insufficient documentation

## 2019-05-29 DIAGNOSIS — E049 Nontoxic goiter, unspecified: Secondary | ICD-10-CM | POA: Insufficient documentation

## 2019-05-29 DIAGNOSIS — Z9884 Bariatric surgery status: Secondary | ICD-10-CM | POA: Insufficient documentation

## 2019-05-29 DIAGNOSIS — Z6830 Body mass index (BMI) 30.0-30.9, adult: Secondary | ICD-10-CM | POA: Insufficient documentation

## 2019-05-29 DIAGNOSIS — K219 Gastro-esophageal reflux disease without esophagitis: Secondary | ICD-10-CM | POA: Insufficient documentation

## 2019-05-29 DIAGNOSIS — I10 Essential (primary) hypertension: Secondary | ICD-10-CM | POA: Insufficient documentation

## 2019-05-29 DIAGNOSIS — G4730 Sleep apnea, unspecified: Secondary | ICD-10-CM | POA: Insufficient documentation

## 2019-05-29 DIAGNOSIS — M797 Fibromyalgia: Secondary | ICD-10-CM | POA: Insufficient documentation

## 2019-05-29 DIAGNOSIS — Z8349 Family history of other endocrine, nutritional and metabolic diseases: Secondary | ICD-10-CM | POA: Insufficient documentation

## 2019-05-29 HISTORY — PX: THYROIDECTOMY: SHX17

## 2019-05-29 LAB — GLUCOSE, CAPILLARY: Glucose-Capillary: 191 mg/dL — ABNORMAL HIGH (ref 70–99)

## 2019-05-29 SURGERY — THYROIDECTOMY
Anesthesia: General | Site: Neck

## 2019-05-29 MED ORDER — DEXAMETHASONE SODIUM PHOSPHATE 10 MG/ML IJ SOLN
INTRAMUSCULAR | Status: AC
  Filled 2019-05-29: qty 1

## 2019-05-29 MED ORDER — HYDRALAZINE HCL 20 MG/ML IJ SOLN
10.0000 mg | Freq: Once | INTRAMUSCULAR | Status: AC
  Administered 2019-05-29: 10 mg via INTRAVENOUS

## 2019-05-29 MED ORDER — EPHEDRINE SULFATE-NACL 50-0.9 MG/10ML-% IV SOSY
PREFILLED_SYRINGE | INTRAVENOUS | Status: DC | PRN
  Administered 2019-05-29: 15 mg via INTRAVENOUS

## 2019-05-29 MED ORDER — OXYCODONE HCL 5 MG PO TABS
5.0000 mg | ORAL_TABLET | ORAL | Status: DC | PRN
  Administered 2019-05-30: 5 mg via ORAL
  Filled 2019-05-29: qty 1

## 2019-05-29 MED ORDER — PROPOFOL 10 MG/ML IV BOLUS
INTRAVENOUS | Status: DC | PRN
  Administered 2019-05-29: 150 mg via INTRAVENOUS
  Administered 2019-05-29: 30 mg via INTRAVENOUS

## 2019-05-29 MED ORDER — ACETAMINOPHEN 650 MG RE SUPP: 650.0000 mg | Freq: Four times a day (QID) | RECTAL | Status: DC | PRN

## 2019-05-29 MED ORDER — ONDANSETRON HCL 4 MG/2ML IJ SOLN: 4.0000 mg | Freq: Four times a day (QID) | INTRAMUSCULAR | Status: DC | PRN

## 2019-05-29 MED ORDER — ESMOLOL HCL 100 MG/10ML IV SOLN
INTRAVENOUS | Status: AC
  Filled 2019-05-29: qty 10

## 2019-05-29 MED ORDER — PHENYLEPHRINE 40 MCG/ML (10ML) SYRINGE FOR IV PUSH (FOR BLOOD PRESSURE SUPPORT)
PREFILLED_SYRINGE | INTRAVENOUS | Status: DC | PRN
  Administered 2019-05-29: 120 ug via INTRAVENOUS
  Administered 2019-05-29: 80 ug via INTRAVENOUS
  Administered 2019-05-29: 60 ug via INTRAVENOUS

## 2019-05-29 MED ORDER — DULOXETINE HCL 60 MG PO CPEP
120.0000 mg | ORAL_CAPSULE | Freq: Every day | ORAL | Status: DC
  Administered 2019-05-29: 120 mg via ORAL
  Filled 2019-05-29: qty 2

## 2019-05-29 MED ORDER — MEPERIDINE HCL 50 MG/ML IJ SOLN: 6.2500 mg | INTRAMUSCULAR | Status: DC | PRN

## 2019-05-29 MED ORDER — KCL IN DEXTROSE-NACL 20-5-0.45 MEQ/L-%-% IV SOLN
INTRAVENOUS | Status: DC
  Filled 2019-05-29: qty 1000

## 2019-05-29 MED ORDER — LIDOCAINE 2% (20 MG/ML) 5 ML SYRINGE
INTRAMUSCULAR | Status: DC | PRN
  Administered 2019-05-29: 100 mg via INTRAVENOUS

## 2019-05-29 MED ORDER — LACTATED RINGERS IV SOLN
INTRAVENOUS | Status: DC
  Administered 2019-05-29: 1000 mL via INTRAVENOUS

## 2019-05-29 MED ORDER — ROCURONIUM BROMIDE 10 MG/ML (PF) SYRINGE
PREFILLED_SYRINGE | INTRAVENOUS | Status: DC | PRN
  Administered 2019-05-29: 70 mg via INTRAVENOUS
  Administered 2019-05-29: 15 mg via INTRAVENOUS

## 2019-05-29 MED ORDER — ROCURONIUM BROMIDE 10 MG/ML (PF) SYRINGE
PREFILLED_SYRINGE | INTRAVENOUS | Status: AC
  Filled 2019-05-29: qty 10

## 2019-05-29 MED ORDER — ALPRAZOLAM 1 MG PO TABS
1.0000 mg | ORAL_TABLET | Freq: Four times a day (QID) | ORAL | Status: DC | PRN
  Administered 2019-05-29: 1 mg via ORAL
  Filled 2019-05-29: qty 1

## 2019-05-29 MED ORDER — CHLORHEXIDINE GLUCONATE CLOTH 2 % EX PADS: 6.0000 | MEDICATED_PAD | Freq: Once | CUTANEOUS | Status: DC

## 2019-05-29 MED ORDER — HYDROMORPHONE HCL 1 MG/ML IJ SOLN
1.0000 mg | INTRAMUSCULAR | Status: DC | PRN
  Administered 2019-05-29: 20:00:00 1 mg via INTRAVENOUS
  Filled 2019-05-29: qty 1

## 2019-05-29 MED ORDER — FENTANYL CITRATE (PF) 250 MCG/5ML IJ SOLN
INTRAMUSCULAR | Status: AC
  Filled 2019-05-29: qty 5

## 2019-05-29 MED ORDER — PHENYLEPHRINE 40 MCG/ML (10ML) SYRINGE FOR IV PUSH (FOR BLOOD PRESSURE SUPPORT)
PREFILLED_SYRINGE | INTRAVENOUS | Status: AC
  Filled 2019-05-29: qty 10

## 2019-05-29 MED ORDER — SCOPOLAMINE 1 MG/3DAYS TD PT72
MEDICATED_PATCH | TRANSDERMAL | Status: AC
  Administered 2019-05-29: 1.5 mg via TRANSDERMAL
  Filled 2019-05-29: qty 1

## 2019-05-29 MED ORDER — MIDAZOLAM HCL 2 MG/2ML IJ SOLN
INTRAMUSCULAR | Status: AC
  Filled 2019-05-29: qty 2

## 2019-05-29 MED ORDER — LIDOCAINE 2% (20 MG/ML) 5 ML SYRINGE
INTRAMUSCULAR | Status: AC
  Filled 2019-05-29: qty 5

## 2019-05-29 MED ORDER — CALCIUM CARBONATE 1250 (500 CA) MG PO TABS
2.0000 | ORAL_TABLET | Freq: Three times a day (TID) | ORAL | Status: DC
  Administered 2019-05-29 – 2019-05-30 (×2): 1000 mg via ORAL
  Filled 2019-05-29 (×2): qty 1

## 2019-05-29 MED ORDER — PROPOFOL 10 MG/ML IV BOLUS
INTRAVENOUS | Status: AC
  Filled 2019-05-29: qty 20

## 2019-05-29 MED ORDER — HYDRALAZINE HCL 20 MG/ML IJ SOLN
INTRAMUSCULAR | Status: AC
  Filled 2019-05-29: qty 1

## 2019-05-29 MED ORDER — MIDAZOLAM HCL 5 MG/5ML IJ SOLN
INTRAMUSCULAR | Status: DC | PRN
  Administered 2019-05-29: 2 mg via INTRAVENOUS

## 2019-05-29 MED ORDER — TRAZODONE HCL 50 MG PO TABS
50.0000 mg | ORAL_TABLET | Freq: Every day | ORAL | Status: DC
  Administered 2019-05-29: 50 mg via ORAL
  Filled 2019-05-29: qty 1

## 2019-05-29 MED ORDER — CIPROFLOXACIN IN D5W 400 MG/200ML IV SOLN
400.0000 mg | INTRAVENOUS | Status: AC
  Administered 2019-05-29: 400 mg via INTRAVENOUS
  Filled 2019-05-29: qty 200

## 2019-05-29 MED ORDER — 0.9 % SODIUM CHLORIDE (POUR BTL) OPTIME
TOPICAL | Status: DC | PRN
  Administered 2019-05-29: 1000 mL

## 2019-05-29 MED ORDER — ONDANSETRON HCL 4 MG/2ML IJ SOLN
INTRAMUSCULAR | Status: DC | PRN
  Administered 2019-05-29: 4 mg via INTRAVENOUS

## 2019-05-29 MED ORDER — SUGAMMADEX SODIUM 200 MG/2ML IV SOLN
INTRAVENOUS | Status: DC | PRN
  Administered 2019-05-29: 200 mg via INTRAVENOUS

## 2019-05-29 MED ORDER — TRAMADOL HCL 50 MG PO TABS
50.0000 mg | ORAL_TABLET | Freq: Four times a day (QID) | ORAL | Status: DC | PRN
  Administered 2019-05-29 – 2019-05-30 (×2): 50 mg via ORAL
  Filled 2019-05-29 (×2): qty 1

## 2019-05-29 MED ORDER — KETOROLAC TROMETHAMINE 15 MG/ML IJ SOLN: 15.0000 mg | Freq: Once | INTRAMUSCULAR | Status: DC

## 2019-05-29 MED ORDER — ESMOLOL HCL 100 MG/10ML IV SOLN
INTRAVENOUS | Status: DC | PRN
  Administered 2019-05-29: 20 mg via INTRAVENOUS

## 2019-05-29 MED ORDER — ACETAMINOPHEN 325 MG PO TABS: 650.0000 mg | ORAL_TABLET | Freq: Four times a day (QID) | ORAL | Status: DC | PRN

## 2019-05-29 MED ORDER — PROMETHAZINE HCL 25 MG/ML IJ SOLN: 6.2500 mg | INTRAMUSCULAR | Status: DC | PRN

## 2019-05-29 MED ORDER — FENTANYL CITRATE (PF) 100 MCG/2ML IJ SOLN
25.0000 ug | INTRAMUSCULAR | Status: DC | PRN
  Administered 2019-05-29 (×2): 50 ug via INTRAVENOUS

## 2019-05-29 MED ORDER — FENTANYL CITRATE (PF) 250 MCG/5ML IJ SOLN
INTRAMUSCULAR | Status: DC | PRN
  Administered 2019-05-29: 150 ug via INTRAVENOUS
  Administered 2019-05-29 (×2): 50 ug via INTRAVENOUS

## 2019-05-29 MED ORDER — ONDANSETRON HCL 4 MG/2ML IJ SOLN
INTRAMUSCULAR | Status: AC
  Filled 2019-05-29: qty 2

## 2019-05-29 MED ORDER — ONDANSETRON 4 MG PO TBDP: 4.0000 mg | ORAL_TABLET | Freq: Four times a day (QID) | ORAL | Status: DC | PRN

## 2019-05-29 MED ORDER — EPHEDRINE 5 MG/ML INJ
INTRAVENOUS | Status: AC
  Filled 2019-05-29: qty 10

## 2019-05-29 MED ORDER — SCOPOLAMINE 1 MG/3DAYS TD PT72: 1.0000 | MEDICATED_PATCH | Freq: Once | TRANSDERMAL | Status: DC

## 2019-05-29 MED ORDER — FENTANYL CITRATE (PF) 100 MCG/2ML IJ SOLN
INTRAMUSCULAR | Status: AC
  Filled 2019-05-29: qty 4

## 2019-05-29 SURGICAL SUPPLY — 28 items
ATTRACTOMAT 16X20 MAGNETIC DRP (DRAPES) ×2 IMPLANT
BLADE SURG 15 STRL LF DISP TIS (BLADE) ×1 IMPLANT
BLADE SURG 15 STRL SS (BLADE) ×2
CHLORAPREP W/TINT 26 (MISCELLANEOUS) ×2 IMPLANT
CLIP VESOCCLUDE MED 6/CT (CLIP) ×8 IMPLANT
CLIP VESOCCLUDE SM WIDE 6/CT (CLIP) ×6 IMPLANT
COVER SURGICAL LIGHT HANDLE (MISCELLANEOUS) ×2 IMPLANT
COVER WAND RF STERILE (DRAPES) ×2 IMPLANT
DERMABOND ADVANCED (GAUZE/BANDAGES/DRESSINGS) ×1
DERMABOND ADVANCED .7 DNX12 (GAUZE/BANDAGES/DRESSINGS) ×1 IMPLANT
DRAPE LAPAROTOMY T 98X78 PEDS (DRAPES) ×2 IMPLANT
ELECT REM PT RETURN 15FT ADLT (MISCELLANEOUS) ×2 IMPLANT
GAUZE 4X4 16PLY RFD (DISPOSABLE) ×2 IMPLANT
GLOVE SURG ORTHO 8.0 STRL STRW (GLOVE) ×2 IMPLANT
GOWN STRL REUS W/TWL XL LVL3 (GOWN DISPOSABLE) ×4 IMPLANT
HEMOSTAT SURGICEL 2X4 FIBR (HEMOSTASIS) ×2 IMPLANT
ILLUMINATOR WAVEGUIDE N/F (MISCELLANEOUS) ×2 IMPLANT
KIT BASIN OR (CUSTOM PROCEDURE TRAY) ×2 IMPLANT
KIT TURNOVER KIT A (KITS) IMPLANT
PACK BASIC VI WITH GOWN DISP (CUSTOM PROCEDURE TRAY) ×2 IMPLANT
PENCIL SMOKE EVACUATOR (MISCELLANEOUS) IMPLANT
SHEARS HARMONIC 9CM CVD (BLADE) ×2 IMPLANT
SUT MNCRL AB 4-0 PS2 18 (SUTURE) ×2 IMPLANT
SUT VIC AB 3-0 SH 18 (SUTURE) ×4 IMPLANT
SYR BULB IRRIGATION 50ML (SYRINGE) ×2 IMPLANT
TOWEL OR 17X26 10 PK STRL BLUE (TOWEL DISPOSABLE) ×2 IMPLANT
TOWEL OR NON WOVEN STRL DISP B (DISPOSABLE) ×2 IMPLANT
TUBING CONNECTING 10 (TUBING) ×2 IMPLANT

## 2019-05-29 NOTE — Transfer of Care (Signed)
Immediate Anesthesia Transfer of Care Note  Patient: Kelly Barber  Procedure(s) Performed: TOTAL THYROIDECTOMY (N/A Neck)  Patient Location: PACU  Anesthesia Type:General  Level of Consciousness: sedated  Airway & Oxygen Therapy: Patient Spontanous Breathing and Patient connected to face mask oxygen  Post-op Assessment: Report given to RN and Post -op Vital signs reviewed and stable  Post vital signs: Reviewed and stable  Last Vitals:  Vitals Value Taken Time  BP 204/100 05/29/19 1130  Temp    Pulse 79 05/29/19 1131  Resp 17 05/29/19 1131  SpO2 100 % 05/29/19 1131  Vitals shown include unvalidated device data.  Last Pain:  Vitals:   05/29/19 0805  TempSrc:   PainSc: 6          Complications: No apparent anesthesia complications

## 2019-05-29 NOTE — Discharge Instructions (Signed)
[facility]  THYROID & PARATHYROID SURGERY:  POST-OP INSTRUCTIONS  Always review your discharge instruction sheet from the facility where your surgery was performed.  A prescription for pain medication may be given to you upon discharge.  Take your pain medication as prescribed.  If narcotic pain medicine is not needed, then you may take acetaminophen (Tylenol) or ibuprofen (Advil) as needed.  Take your usually prescribed medications unless otherwise directed.  If you need a refill on your pain medication, please contact our office during regular business hours.  Prescriptions cannot be processed by our office after 5 pm or on weekends.  Start with a light diet upon arrival home, such as soup and crackers or toast.  Be sure to drink plenty of fluids daily.  Resume your normal diet the day after surgery.  Most patients will experience some swelling and bruising on the chest and neck area.  Ice packs will help.  Swelling and bruising can take several days to resolve.   It is common to experience some constipation after surgery.  Increasing fluid intake and taking a stool softener (Colace) will usually help or prevent this problem.  A mild laxative (Milk of Magnesia or Miralax) should be taken according to package directions if there has been no bowel movement after 48 hours.  You have steri-strips and a gauze dressing over your incision.  You may remove the gauze bandage on the second day after surgery, and you may shower at that time.  Leave your steri-strips (small skin tapes) in place directly over the incision.  These strips should remain on the skin for 5-7 days and then be removed.  You may get them wet in the shower and pat them dry.  You may resume regular (light) daily activities beginning the next day (such as daily self-care, walking, climbing stairs) gradually increasing activities as tolerated.  You may have sexual intercourse when it is comfortable.  Refrain from  any heavy lifting or straining until approved by your doctor.  You may drive when you no longer are taking prescription pain medication, you can comfortably wear a seatbelt, and you can safely maneuver your car and apply brakes.  You should see your doctor in the office for a follow-up appointment approximately three weeks after your surgery.  Make sure that you call for this appointment within a day or two after you arrive home to insure a convenient appointment time.  WHEN TO CALL YOUR DOCTOR: -- Fever greater than 101.5 -- Inability to urinate -- Nausea and/or vomiting - persistent -- Extreme swelling or bruising -- Continued bleeding from incision -- Increased pain, redness, or drainage from the incision -- Difficulty swallowing or breathing -- Muscle cramping or spasms -- Numbness or tingling in hands or around lips  The clinic staff is available to answer your questions during regular business hours.  Please don't hesitate to call and ask to speak to one of the nurses if you have concerns.  Mardy Lucier, MD [facility] Office: [phone] 

## 2019-05-29 NOTE — Op Note (Signed)
Procedure Note  Pre-operative Diagnosis:  Substernal thyroid goiter, hyperthyroidism  Post-operative Diagnosis:  same  Surgeon:  Darnell Level, MD  Assistant:  none   Procedure:  Total thyroidectomy  Anesthesia:  General  Estimated Blood Loss:  minimal  Drains: none         Specimen: thyroid to pathology  Indications:  Patient is referred by Dr. Marlene Lard for surgical evaluation and management of large thyroid goiter with subclinical hyperthyroidism. Patient has had a known thyroid goiter dating back many years. She has developed mild to moderate compressive symptoms including dysphagia and globus sensation. She is having intermittent episodes of hoarseness. Patient has subclinical hyperthyroidism and is recently been taking methimazole. Her most recent TSH level was 0.67. Patient had undergone a nuclear medicine thyroid uptake scan in October 2020 showing uptake within normal limits although subjectively decreased. Patient also underwent a CT scan of the neck in November 2020 showing diffuse enlargement of the thyroid gland with the left lobe measuring 9.1 cm and right lobe measuring 7.6 cm. This showed interval enlargement compared to a prior study in 2007. There is also intrathoracic extension of the left thyroid lobe. Patient had previously taken levothyroxine many years ago. She denies any prior head or neck surgery. There is a family history of Graves' disease and the patient's mother and unknown type of medical thyroid disease in the patient's brother and sister, according to the patient. Patient is known to our practice from previous bariatric surgery and has had significant weight loss. She now presents for consideration for thyroidectomy for management of thyroid goiter with intrathoracic extension and subclinical hyperthyroidism.  Procedure Details: Procedure was done in OR #1 at the [facility]. The patient was brought to the operating room and placed in  a supine position on the operating room table. Following administration of general anesthesia, the patient was positioned and then prepped and draped in the usual aseptic fashion. After ascertaining that an adequate level of anesthesia had been achieved, a small Kocher incision was made with #15 blade. Dissection was carried through subcutaneous tissues and platysma.Hemostasis was achieved with the electrocautery. Skin flaps were elevated cephalad and caudad from the thyroid notch to the sternal notch. A Mahorner self-retaining retractor was placed for exposure. Strap muscles were incised in the midline and dissection was begun on the left side.  Strap muscles were reflected laterally.  Left thyroid lobe was markedly enlarged and extended beneath the clavicle into the posterior mediastinum.  The left lobe was gently mobilized with blunt dissection. Superior pole vessels were dissected out and divided individually between small and medium ligaclips with the harmonic scalpel. The thyroid lobe was rolled anteriorly. Branches of the inferior thyroid artery were divided between small ligaclips with the harmonic scalpel. Inferior venous tributaries were divided between ligaclips. Both the superior and inferior parathyroid glands were identified and preserved on their vascular pedicles. The recurrent laryngeal nerve was identified and preserved along its course. The ligament of Allyson Sabal was released with the electrocautery and the gland was mobilized onto the anterior trachea. Isthmus was mobilized across the midline. There was no pyramidal lobe present. Dry pack was placed in the left neck.  The right thyroid lobe was gently mobilized with blunt dissection. Right thyroid lobe was moderately enlarged and extended well cephalad. Superior pole vessels were dissected out and divided between small and medium ligaclips with the Harmonic scalpel. Superior parathyroid was identified and preserved. Inferior venous tributaries were  divided between medium ligaclips with the harmonic  scalpel. The right thyroid lobe was rolled anteriorly and the branches of the inferior thyroid artery divided between small ligaclips. The right recurrent laryngeal nerve was identified and preserved along its course. The ligament of Gwenlyn Found was released with the electrocautery. The right thyroid lobe was mobilized onto the anterior trachea and the remainder of the thyroid was dissected off the anterior trachea and the thyroid was completely excised. A suture was used to mark the left lobe. The entire thyroid gland was submitted to pathology for review.  The neck was irrigated with warm saline. Fibrillar was placed throughout the operative field. Strap muscles were approximated in the midline with interrupted 3-0 Vicryl sutures. Platysma was closed with interrupted 3-0 Vicryl sutures. Skin was closed with a running 4-0 Monocryl subcuticular suture. Wound was washed and Dermabond was applied. The patient was awakened from anesthesia and brought to the recovery room. The patient tolerated the procedure well.   Armandina Gemma, MD [facility] Office: [phone]

## 2019-05-29 NOTE — Anesthesia Procedure Notes (Signed)
Procedure Name: Intubation Date/Time: 05/29/2019 9:28 AM Performed by: Talbot Grumbling, CRNA Pre-anesthesia Checklist: Patient identified, Emergency Drugs available, Suction available and Patient being monitored Patient Re-evaluated:Patient Re-evaluated prior to induction Oxygen Delivery Method: Circle system utilized Preoxygenation: Pre-oxygenation with 100% oxygen Induction Type: IV induction Ventilation: Mask ventilation without difficulty Laryngoscope Size: Mac and 3 Grade View: Grade I Tube type: Oral Tube size: 7.5 mm Number of attempts: 1 Airway Equipment and Method: Stylet Placement Confirmation: ETT inserted through vocal cords under direct vision,  positive ETCO2 and breath sounds checked- equal and bilateral Secured at: 22 cm Tube secured with: Tape Dental Injury: Teeth and Oropharynx as per pre-operative assessment

## 2019-05-29 NOTE — Anesthesia Preprocedure Evaluation (Addendum)
Anesthesia Evaluation  Patient identified by MRN, date of birth, ID band Patient awake    Reviewed: Allergy & Precautions, H&P , NPO status , Patient's Chart, lab work & pertinent test results  History of Anesthesia Complications (+) PONV and history of anesthetic complications  Airway Mallampati: I  TM Distance: >3 FB Neck ROM: full    Dental  (+) Teeth Intact, Dental Advisory Given   Pulmonary sleep apnea , former smoker,    breath sounds clear to auscultation       Cardiovascular hypertension, Pt. on medications + Peripheral Vascular Disease  Normal cardiovascular exam Rhythm:regular Rate:Normal     Neuro/Psych PSYCHIATRIC DISORDERS Anxiety Depression  Neuromuscular disease    GI/Hepatic hiatal hernia, GERD  ,  Endo/Other  diabetes, Type 2, Oral Hypoglycemic AgentsHyperthyroidism   Renal/GU      Musculoskeletal  (+) Fibromyalgia -  Abdominal (+) + obese,   Peds  Hematology   Anesthesia Other Findings   Reproductive/Obstetrics                             Anesthesia Physical  Anesthesia Plan  ASA: III  Anesthesia Plan: General   Post-op Pain Management:    Induction: Intravenous  PONV Risk Score and Plan: 4 or greater and Ondansetron, Midazolam, Scopolamine patch - Pre-op and Treatment may vary due to age or medical condition  Airway Management Planned: Oral ETT  Additional Equipment: None  Intra-op Plan:   Post-operative Plan: Extubation in OR  Informed Consent: I have reviewed the patients History and Physical, chart, labs and discussed the procedure including the risks, benefits and alternatives for the proposed anesthesia with the patient or authorized representative who has indicated his/her understanding and acceptance.     Dental advisory given  Plan Discussed with: CRNA  Anesthesia Plan Comments:         Anesthesia Quick Evaluation

## 2019-05-29 NOTE — Interval H&P Note (Signed)
History and Physical Interval Note:  05/29/2019 8:48 AM  Kelly Barber  has presented today for surgery, with the diagnosis of THYROID GOITER,  SUBSTERNAL EXTENSION, HYPERTHYROIDISM.  The various methods of treatment have been discussed with the patient and family. After consideration of risks, benefits and other options for treatment, the patient has consented to    Procedure(s): TOTAL THYROIDECTOMY (N/A) as a surgical intervention.    The patient's history has been reviewed, patient examined, no change in status, stable for surgery.  I have reviewed the patient's chart and labs.  Questions were answered to the patient's satisfaction.    Darnell Level, MD [facility] Office: [phone]   Darnell Level

## 2019-05-30 DIAGNOSIS — E049 Nontoxic goiter, unspecified: Secondary | ICD-10-CM | POA: Diagnosis not present

## 2019-05-30 LAB — BASIC METABOLIC PANEL
Anion gap: 10 (ref 5–15)
BUN: 28 mg/dL — ABNORMAL HIGH (ref 6–20)
CO2: 26 mmol/L (ref 22–32)
Calcium: 8.4 mg/dL — ABNORMAL LOW (ref 8.9–10.3)
Chloride: 101 mmol/L (ref 98–111)
Creatinine, Ser: 1.12 mg/dL — ABNORMAL HIGH (ref 0.44–1.00)
GFR calc Af Amer: >60 mL/min (ref >60)
GFR calc non Af Amer: 56 mL/min — ABNORMAL LOW (ref >60)
Glucose, Bld: 232 mg/dL — ABNORMAL HIGH (ref 70–99)
Potassium: 5.4 mmol/L — ABNORMAL HIGH (ref 3.5–5.1)
Sodium: 137 mmol/L (ref 135–145)

## 2019-05-30 MED ORDER — TRAMADOL HCL 50 MG PO TABS: 50.0000 mg | ORAL_TABLET | Freq: Four times a day (QID) | ORAL | 0 refills | Status: DC | PRN

## 2019-05-30 MED ORDER — CALCIUM CARBONATE 1250 (500 CA) MG PO TABS: 2.0000 | ORAL_TABLET | Freq: Three times a day (TID) | ORAL | 0 refills | Status: DC

## 2019-05-30 MED ORDER — ACETAMINOPHEN 325 MG PO TABS: 650.0000 mg | ORAL_TABLET | Freq: Four times a day (QID) | ORAL | Status: AC | PRN

## 2019-05-30 NOTE — Progress Notes (Signed)
Discharge instructions discussed with patient, verbalized agreement and understanding 

## 2019-05-30 NOTE — Discharge Summary (Signed)
Physician Discharge Summary  Patient ID: Kelly Barber MRN: [id number] DOB/AGE: 55-Jun-1967 55 y.o.  Admit date: 05/29/2019 Discharge date: 05/30/2019  Admission Diagnoses:  Discharge Diagnoses:  Principal Problem:   Substernal thyroid goiter Active Problems:   Subclinical hyperthyroidism   Discharged Condition: good  Hospital Course: She was admitted for supportive care after total thyroidectomy for substernal thyroid goiter and hyperthyroidism.  The evening of surgery she was able to tolerate a diet, and her pain was well controlled with oral medications.  On the morning of postop day 1, she remained in good condition.  Denies dysphagia or hoarseness.  Pain well controlled.  Morning labs within acceptable limits, her calcium did decrease only slightly from 9.1 to 8.4.  She will be discharged on calcium supplements.  Her endocrinologist will direct her thyroid supplementation.   Consults: None  Significant Diagnostic Studies: labs: See epic  Treatments: surgery: As above  Discharge Exam: Blood pressure 114/75, pulse 75, temperature 98.2 F (36.8 C), temperature source Oral, resp. rate 16, height 5\' 7"  (1.702 m), weight 93.3 kg, last menstrual period 03/05/2016, SpO2 97 %. General appearance: alert and cooperative Neck: Incision is clean dry intact.  Neck is soft, no seroma or hematoma.  Voice is normal. Resp: Unlabored Cardio: regular rate and rhythm Neurologic: Grossly normal Incision/Wound: Intact with Dermabond  Disposition: Discharge disposition: 01-Home or Self Care        Allergies as of 05/30/2019      Reactions   Amoxicillin-pot Clavulanate Shortness Of Breath   Has patient had a PCN reaction causing immediate rash, facial/tongue/throat swelling, SOB or lightheadedness with hypotension: Yes Has patient had a PCN reaction causing severe rash involving mucus membranes or skin necrosis: No Has patient had a PCN reaction that required hospitalization: Yes Has  patient had a PCN reaction occurring within the last 10 years: No If all of the above answers are "NO", then may proceed with Cephalosporin use.   Codeine Anaphylaxis   Darvocet [propoxyphene N-acetaminophen] Anaphylaxis   Plain Tylenol ok   Oxycodone Anaphylaxis   Statins Other (See Comments)   Joint pain   Propoxyphene       Medication List    TAKE these medications   acetaminophen 325 MG tablet Commonly known as: TYLENOL Take 2 tablets (650 mg total) by mouth every 6 (six) hours as needed for mild pain (or Fever >/= 101).   ALPRAZolam 1 MG tablet Commonly known as: XANAX Take 1 mg by mouth 4 (four) times daily as needed for anxiety.   amphetamine-dextroamphetamine 20 MG tablet Commonly known as: ADDERALL Take 20 mg by mouth in the morning, at noon, in the evening, and at bedtime.   calcium carbonate 1250 (500 Ca) MG tablet Commonly known as: OS-CAL - dosed in mg of elemental calcium Take 2 tablets (1,000 mg of elemental calcium total) by mouth 3 (three) times daily with meals.   clopidogrel 75 MG tablet Commonly known as: PLAVIX Take 75 mg by mouth every evening.   DULoxetine 60 MG capsule Commonly known as: CYMBALTA Take 120 mg by mouth at bedtime.   Ozempic (0.25 or 0.5 MG/DOSE) 2 MG/1.5ML Sopn Generic drug: Semaglutide(0.25 or 0.5MG /DOS) Inject 0.25 mg into the skin every Wednesday.   pregabalin 75 MG capsule Commonly known as: LYRICA Take 75 mg by mouth every evening.   traMADol 50 MG tablet Commonly known as: ULTRAM Take 1 tablet (50 mg total) by mouth every 6 (six) hours as needed (mild pain).   traZODone 50 MG  tablet Commonly known as: DESYREL Take 50 mg by mouth at bedtime.      Follow-up Information    Armandina Gemma, MD. Schedule an appointment as soon as possible for a visit in 3 weeks.   Specialty: General Surgery Why: For wound re-check Contact information: [address] [place] [address] [phone]            Signed: Clovis Riley 05/30/2019, 8:01 AM

## 2019-05-31 NOTE — Anesthesia Postprocedure Evaluation (Signed)
Anesthesia Post Note  Patient: Kelly Barber  Procedure(s) Performed: TOTAL THYROIDECTOMY (N/A Neck)     Patient location during evaluation: PACU Anesthesia Type: General Level of consciousness: sedated Pain management: pain level controlled Vital Signs Assessment: post-procedure vital signs reviewed and stable Respiratory status: spontaneous breathing Cardiovascular status: blood pressure returned to baseline Postop Assessment: no apparent nausea or vomiting Anesthetic complications: no    Last Vitals:  Vitals:   05/30/19 0126 05/30/19 0542  BP: 113/69 114/75  Pulse: 79 75  Resp: 14 16  Temp: 36.9 C 36.8 C  SpO2: 98% 97%    Last Pain:  Vitals:   05/30/19 0940  TempSrc:   PainSc: 7    Pain Goal: Patients Stated Pain Goal: 3 (05/29/19 2030)                 Caren Macadam

## 2019-06-01 LAB — SURGICAL PATHOLOGY

## 2019-06-01 LAB — GLUCOSE, CAPILLARY: Glucose-Capillary: 171 mg/dL — ABNORMAL HIGH (ref 70–99)

## 2019-06-01 NOTE — Progress Notes (Signed)
Please contact patient and notify of benign pathology results.  Landon Bassford M. Jamorion Gomillion, MD, FACS [facility] Office: [phone]   

## 2019-06-08 NOTE — Progress Notes (Signed)
[facility] - Clinic Note  06/10/2019     CHIEF COMPLAINT Patient presents for Diabetic Eye Exam   HISTORY OF PRESENT ILLNESS: Kelly Barber is a 54 y.o. female who presents to the clinic today for:   HPI    Diabetic Eye Exam    Vision is blurred for near and is blurred for distance (OS).  Associated Symptoms Floaters.  Negative for Flashes, Distortion, Blind Spot, Redness, Pain, Photophobia, Glare, Trauma, Jaw Claudication, Scalp Tenderness, Shoulder/Hip pain, Fever, Weight Loss and Fatigue.  Diabetes characteristics include Type 2 and on insulin.  This started 34 years ago.  Blood sugar level fluctuates.  Last Blood Glucose: Hasn't checked.  Last A1C 8.  Associated Diagnosis Neuropathy.  I, the attending physician,  performed the HPI with the patient and updated documentation appropriately.          Comments    Patient referred for diabetic eye exam. Patient states vision blurred off and on OS for the past 8 months. Patient notices this especially if she sleeps on left side. It takes awhile for vision to clear after getting out of bed OS. Sometimes, patient sees floaters OD, no flashes.  Last a1c was 8.0, checked about 3 months ago. Patient had thyroid removed on 03.26.21---patient starts taking synthroid this evening. Had bariatric surgery in 2019.        Last edited by Rennis Chris, MD on 06/10/2019  2:56 PM. (History)    pt is here on the referral of Dr. Laruth Bouchard, she states she has been seeing him for about a year and a half, she states he thought she had glaucoma at one point so he had her on pressure drops, but states he has now taken her off of those drops, pt states she has been diabetic since she was 21 (type 2), she states for the past 2 years her blood sugar has been much better, but for a number of years it was unmanageable bc she was sick for a long time, she states she has had 2 toes on her right foot amputated and 3 on her left foot, she had her  thyroid removed in March 2021, pt states she has peripheral artery disease, but has not had any heart attacks or strokes  Referring physician: Olivia Canter, MD [address] [place],  [place] [address]  HISTORICAL INFORMATION:   Selected notes from the MEDICAL RECORD NUMBER Referred by Dr. Laruth Bouchard for DME   CURRENT MEDICATIONS: No current outpatient medications on file. (Ophthalmic Drugs)   No current facility-administered medications for this visit. (Ophthalmic Drugs)   Current Outpatient Medications (Other)  Medication Sig  . acetaminophen (TYLENOL) 325 MG tablet Take 2 tablets (650 mg total) by mouth every 6 (six) hours as needed for mild pain (or Fever >/= 101).  Marland Kitchen ALPRAZolam (XANAX) 1 MG tablet Take 1 mg by mouth 4 (four) times daily as needed for anxiety.   Marland Kitchen amphetamine-dextroamphetamine (ADDERALL) 20 MG tablet Take 20 mg by mouth in the morning, at noon, in the evening, and at bedtime.   . calcium carbonate (OS-CAL - DOSED IN MG OF ELEMENTAL CALCIUM) 1250 (500 Ca) MG tablet Take 2 tablets (1,000 mg of elemental calcium total) by mouth 3 (three) times daily with meals.  . clopidogrel (PLAVIX) 75 MG tablet Take 75 mg by mouth every evening.  . DULoxetine (CYMBALTA) 60 MG capsule Take 120 mg by mouth at bedtime.  Marland Kitchen OZEMPIC, 0.25 OR  0.5 MG/DOSE, 2 MG/1.5ML SOPN Inject 0.25 mg into the skin every Wednesday.   . traMADol (ULTRAM) 50 MG tablet Take 1 tablet (50 mg total) by mouth every 6 (six) hours as needed (mild pain).  . traZODone (DESYREL) 50 MG tablet Take 50 mg by mouth at bedtime.  . pregabalin (LYRICA) 75 MG capsule Take 75 mg by mouth every evening.   No current facility-administered medications for this visit. (Other)      REVIEW OF SYSTEMS: ROS    Positive for: Musculoskeletal, Endocrine, Eyes   Negative for: Constitutional, Gastrointestinal, Neurological, Skin, Genitourinary, HENT, Cardiovascular, Respiratory, Psychiatric, Allergic/Imm, Heme/Lymph   Last  edited by Annalee GentaBarber, Daryl D, COT on 06/10/2019  1:44 PM. (History)       ALLERGIES Allergies  Allergen Reactions  . Amoxicillin-Pot Clavulanate Shortness Of Breath    Has patient had a PCN reaction causing immediate rash, facial/tongue/throat swelling, SOB or lightheadedness with hypotension: Yes Has patient had a PCN reaction causing severe rash involving mucus membranes or skin necrosis: No Has patient had a PCN reaction that required hospitalization: Yes Has patient had a PCN reaction occurring within the last 10 years: No If all of the above answers are "NO", then may proceed with Cephalosporin use.   . Codeine Anaphylaxis  . Darvocet [Propoxyphene N-Acetaminophen] Anaphylaxis    Plain Tylenol ok  . Oxycodone Anaphylaxis  . Statins Other (See Comments)    Joint pain  . Propoxyphene     PAST MEDICAL HISTORY Past Medical History:  Diagnosis Date  . Anemia   . Anxiety    takes xanax daily  . Anxiety disorder   . Complication of anesthesia   . Depression, major    takes wellbutrin and celexa daily  . Diabetes mellitus    type 2   . Fibromyalgia   . GERD (gastroesophageal reflux disease)    doesn't take any meds  . H/O hiatal hernia   . Hyperlipidemia   . Hypertension    takes Lisinopril and Diovan daily  . Hyperthyroidism    yrs ago but doesn't require meds for this  . Immune deficiency disorder (HCC)    pt states she has no clue what this is  . Insomnia   . Joint pain   . Joint swelling   . PTSD (post-traumatic stress disorder)   . PTSD (post-traumatic stress disorder)   . PVD (peripheral vascular disease) (HCC)   . Sleep apnea    Past Surgical History:  Procedure Laterality Date  . ABDOMINAL AORTAGRAM N/A 08/10/2011   Procedure: ABDOMINAL Ronny FlurryAORTAGRAM;  Surgeon: Sherren Kernsharles E Fields, MD;  [facility];  Service: Cardiovascular;  Laterality: N/A;  . AMPUTATION     lft foot toes 1.2.3  . AMPUTATION  01/07/2012   Procedure: AMPUTATION RAY;  Surgeon: Sherren Kernsharles E  Fields, MD;  [facility];  Service: Vascular;  Laterality: Right;  4TH & 5TH toes  . aortogram    . CHOLECYSTECTOMY  1990  . COLONOSCOPY    . DILATION AND CURETTAGE OF UTERUS    . FEMORAL BYPASS  04/29/2008   left  . FEMORAL-POPLITEAL BYPASS GRAFT  08/27/2011   Procedure: BYPASS GRAFT FEMORAL-POPLITEAL ARTERY;  Surgeon: Sherren Kernsharles E Fields, MD;  [facility];  Service: Vascular;  Laterality: Right;  . FEMORAL-POPLITEAL BYPASS GRAFT  08/29/2011   Procedure: BYPASS GRAFT FEMORAL-POPLITEAL ARTERY;  Surgeon: Sherren Kernsharles E Fields, MD;  [facility];  Service: Vascular;  Laterality: Right;  Vein Patch Angionplasty and Thrombectomy   .  GASTRIC ROUX-EN-Y N/A 09/02/2017   Procedure: LAPAROSCOPIC ROUX-EN-Y GASTRIC BYPASS WITH UPPER ENDOSCOPY AND ERAS PATHWAY;  Surgeon: Kinsinger, Arta Bruce, MD;  Location: [facility] ORS;  Service: General;  Laterality: N/A;  . INTRAOPERATIVE ARTERIOGRAM  08/29/2011   Procedure: INTRA OPERATIVE ARTERIOGRAM;  Surgeon: Elam Dutch, MD;  Location: [facility];  Service: Vascular;  Laterality: Right;  . LOWER EXTREMITY ANGIOGRAM N/A 08/10/2011   Procedure: LOWER EXTREMITY ANGIOGRAM;  Surgeon: Elam Dutch, MD;  Location: [facility] LAB;  Service: Cardiovascular;  Laterality: N/A;  . THYROIDECTOMY N/A 05/29/2019   Procedure: TOTAL THYROIDECTOMY;  Surgeon: Armandina Gemma, MD;  Location: [facility] ORS;  Service: General;  Laterality: N/A;  . TOE AMPUTATION  07/12/2008    left foot toes 1,2,3  . TUBAL LIGATION    . VASCULAR SURGERY      FAMILY HISTORY Family History  Problem Relation Age of Onset  . Diabetes Mother   . Hyperlipidemia Mother   . Hypertension Mother   . Diabetes Father   . Heart disease Father        before age 77  . Hypertension Father   . Hyperlipidemia Father   . Hypertension Sister   . Diabetes Brother   . Glaucoma Paternal Grandfather     SOCIAL HISTORY Social History   Tobacco Use  . Smoking status: Former Smoker    Packs/day: 1.00    Years: 10.00    Pack  years: 10.00    Types: Cigarettes    Quit date: 07/25/2008    Years since quitting: 10.8  . Smokeless tobacco: Never Used  . Tobacco comment: quit 5yrs ago  Substance Use Topics  . Alcohol use: No  . Drug use: No         OPHTHALMIC EXAM:  Base Eye Exam    Visual Acuity (Snellen - Linear)      Right Left   Dist cc 20/20 -2 20/40 +1   Dist ph cc NI 20/30 -2   Correction: Glasses       Tonometry (Tonopen, 2:00 PM)      Right Left   Pressure 19 20       Pupils      Dark Light Shape React APD   Right 3 2 Round Brisk None   Left 3 2 Round Brisk None       Visual Fields (Counting fingers)      Left Right    Full Full       Extraocular Movement      Right Left    Full, Ortho Full, Ortho       Dilation    Both eyes: 1.0% Mydriacyl, 2.5% Phenylephrine @ 2:00 PM        Slit Lamp and Fundus Exam    Slit Lamp Exam      Right Left   Lids/Lashes Dermatochalasis - upper lid Dermatochalasis - upper lid   Conjunctiva/Sclera Mild Melanosis Mild Melanosis   Cornea Punctate epithelial erosions Punctate epithelial erosions   Anterior Chamber Deep and clear, narrow temporal angle Deep and clear   Iris Round and dilated Round and dilated   Lens 2+ Nuclear sclerosis, 2+ Cortical cataract 2+ Nuclear sclerosis, 2+ Cortical cataract   Vitreous Vitreous syneresis Vitreous syneresis       Fundus Exam      Right Left   Disc Pink and Sharp, +cupping Pink and Sharp, central cupping and pallor   C/D Ratio 0.65 0.6   Macula Blunted foveal reflex, scattered Microaneurysms,  focal Exudates nasal to fovea, scattered cystic changes Blunted foveal reflex,+edma temporal to fovea, scattered focal MA and exudate   Vessels Vascular attenuation, Tortuous Vascular attenuation, Tortuous   Periphery Attached, rare MA/DBH    Attached, scattered DBH greatest posteriorly, White without pressure temporally        Refraction    Wearing Rx      Sphere Cylinder Axis Add   Right -8.25 +2.75 077  +2.25   Left -8.25 +2.50 087 +2.25       Manifest Refraction      Sphere Cylinder Axis Dist VA   Right -8.25 +2.75 065 NI   Left -7.75 +2.25 095 20/30+2          IMAGING AND PROCEDURES  Imaging and Procedures for @TODAY @  OCT, Retina - OU - Both Eyes       Right Eye Quality was good. Central Foveal Thickness: 248. Progression has no prior data. Findings include normal foveal contour, intraretinal fluid, no SRF, vitreomacular adhesion  (Scattered non-central IRF/cystic changes).   Left Eye Quality was good. Central Foveal Thickness: 265. Progression has no prior data. Findings include normal foveal contour, intraretinal fluid, no SRF, intraretinal hyper-reflective material, vitreomacular adhesion  (+IRF/DME).   Notes *Images captured and stored on drive  Diagnosis / Impression:  OD: NFP, no SRF, +IRF -- Scattered non-central IRF/cystic changes OS: NFP, no SRF, +IRF -- +DME  Clinical management:  See below  Abbreviations: NFP - Normal foveal profile. CME - cystoid macular edema. PED - pigment epithelial detachment. IRF - intraretinal fluid. SRF - subretinal fluid. EZ - ellipsoid zone. ERM - epiretinal membrane. ORA - outer retinal atrophy. ORT - outer retinal tubulation. SRHM - subretinal hyper-reflective material        Intravitreal Injection, Pharmacologic Agent - OS - Left Eye       Time Out 06/10/2019. 2:54 PM. Confirmed correct patient, procedure, site, and patient consented.   Anesthesia Topical anesthesia was used. Anesthetic medications included Proparacaine 0.5%, Lidocaine 2%.   Procedure Preparation included 5% betadine to ocular surface, eyelid speculum. A (32g) needle was used.   Injection:  1.25 mg Bevacizumab (AVASTIN) SOLN   NDC: 08/10/2019, Lot: [id number] , Expiration date: 09/17/2019   Route: Intravitreal, Site: Left Eye, Waste: 0 mg  Post-op Post injection exam found visual acuity of at least counting fingers. The patient tolerated the  procedure well. There were no complications. The patient received written and verbal post procedure care education.                 ASSESSMENT/PLAN:    ICD-10-CM   1. Moderate nonproliferative diabetic retinopathy of both eyes with macular edema associated with type 2 diabetes mellitus (HCC)  : [id number] Intravitreal Injection, Pharmacologic Agent - OS - Left Eye    Bevacizumab (AVASTIN) SOLN 1.25 mg  2. Retinal edema  H35.81 OCT, Retina - OU - Both Eyes  3. Essential hypertension  I10   4. Hypertensive retinopathy of both eyes  H35.033   5. Combined forms of age-related cataract of both eyes  H25.813   6. Glaucoma suspect of both eyes  H40.003     1,2. Moderate Non-proliferative diabetic retinopathy, OU  - The incidence, risk factors for progression, natural history and treatment options for diabetic retinopathy  were discussed with patient.    - The need for close monitoring of blood glucose, blood pressure, and serum lipids, avoiding cigarette or any type of tobacco, and the need for long term follow up was also discussed  with patient.  - exam shows scattered MA and exudate OU  - OCT shows diabetic macular edema, both eyes (OS > OD)   - The natural history, pathology, and characteristics of diabetic macular edema discussed with patient.  A generalized discussion of the major clinical trials concerning treatment of diabetic macular edema (ETDRS, DCT, SCORE, RISE / RIDE, and ongoing DRCR net studies) was completed.  This discussion included mention of the various approaches to treating diabetic macular edema (observation, laser photocoagulation, anti-VEGF injections with lucentis / Avastin / Eylea, steroid injections with Kenalog / Ozurdex, and intraocular surgery with vitrectomy).  The goal hemoglobin A1C of 6-7 was discussed, as well as importance of smoking cessation and hypertension control.  Need for ongoing treatment and monitoring were specifically discussed with reference to chronic  nature of diabetic macular edema.  - recommend IVA #1 OS today, 04.07.21  - pt wishes to proceed with injection  - RBA of procedure discussed, questions answered  - informed consent obtained, signed and scanned, 04.07.21  - see procedure note  - f/u in 4 wks, DFE, OCT, FA (optos, transit OS)  3,4. Hypertensive retinopathy OU  - discussed importance of tight BP control  - monitor  5. Mixed form age related cataract OU  - The symptoms of cataract, surgical options, and treatments and risks were discussed with patient.  - discussed diagnosis and progression  - not yet visually significant  - monitor for now  6. Glaucoma Suspect  - IOP today: 19,20  - managed by Dr. Laruth Bouchard  - not on any drops    Ophthalmic Meds Ordered this visit:  Meds ordered this encounter  Medications  . Bevacizumab (AVASTIN) SOLN 1.25 mg       Return in about 4 weeks (around 07/08/2019) for f/u NPDR OU, DFE, OCT.  There are no Patient Instructions on file for this visit.   Explained the diagnoses, plan, and follow up with the patient and they expressed understanding.  Patient expressed understanding of the importance of proper follow up care.   This document serves as a record of services personally performed by Karie Chimera, MD, PhD. It was created on their behalf by Laurian Brim, OA, an ophthalmic assistant. The creation of this record is the provider's dictation and/or activities during the visit.    Electronically signed by: Laurian Brim, OA 04.07.2021 10:34 PM  Karie Chimera, M.D., Ph.D. Diseases & Surgery of the Retina and Vitreous [facility]  I have reviewed the above documentation for accuracy and completeness, and I agree with the above. Karie Chimera, M.D., Ph.D. 06/13/19 10:34 PM   Abbreviations: M myopia (nearsighted); A astigmatism; H hyperopia (farsighted); P presbyopia; Mrx spectacle prescription;  CTL contact lenses; OD right eye; OS left eye; OU both  eyes  XT exotropia; ET esotropia; PEK punctate epithelial keratitis; PEE punctate epithelial erosions; DES dry eye syndrome; MGD meibomian gland dysfunction; ATs artificial tears; PFAT's preservative free artificial tears; NSC nuclear sclerotic cataract; PSC posterior subcapsular cataract; ERM epi-retinal membrane; PVD posterior vitreous detachment; RD retinal detachment; DM diabetes mellitus; DR diabetic retinopathy; NPDR non-proliferative diabetic retinopathy; PDR proliferative diabetic retinopathy; CSME clinically significant macular edema; DME diabetic macular edema; dbh dot blot hemorrhages; CWS cotton wool spot; POAG primary open angle glaucoma; C/D cup-to-disc ratio; HVF humphrey visual field; GVF goldmann visual field; OCT optical coherence tomography; IOP intraocular pressure; BRVO Branch retinal vein occlusion; CRVO central retinal vein occlusion; CRAO central retinal artery occlusion; BRAO  branch retinal artery occlusion; RT retinal tear; SB scleral buckle; PPV pars plana vitrectomy; VH Vitreous hemorrhage; PRP panretinal laser photocoagulation; IVK intravitreal kenalog; VMT vitreomacular traction; MH Macular hole;  NVD neovascularization of the disc; NVE neovascularization elsewhere; AREDS age related eye disease study; ARMD age related macular degeneration; POAG primary open angle glaucoma; EBMD epithelial/anterior basement membrane dystrophy; ACIOL anterior chamber intraocular lens; IOL intraocular lens; PCIOL posterior chamber intraocular lens; Phaco/IOL phacoemulsification with intraocular lens placement; [place] photorefractive keratectomy; LASIK laser assisted in situ keratomileusis; HTN hypertension; DM diabetes mellitus; COPD chronic obstructive pulmonary disease

## 2019-06-10 ENCOUNTER — Encounter (INDEPENDENT_AMBULATORY_CARE_PROVIDER_SITE_OTHER): Payer: Self-pay | Admitting: Ophthalmology

## 2019-06-10 ENCOUNTER — Ambulatory Visit (INDEPENDENT_AMBULATORY_CARE_PROVIDER_SITE_OTHER): Payer: 59 | Admitting: Ophthalmology

## 2019-06-10 DIAGNOSIS — I10 Essential (primary) hypertension: Secondary | ICD-10-CM

## 2019-06-10 DIAGNOSIS — H40003 Preglaucoma, unspecified, bilateral: Secondary | ICD-10-CM

## 2019-06-10 DIAGNOSIS — H25813 Combined forms of age-related cataract, bilateral: Secondary | ICD-10-CM

## 2019-06-10 DIAGNOSIS — H35033 Hypertensive retinopathy, bilateral: Secondary | ICD-10-CM | POA: Diagnosis not present

## 2019-06-10 DIAGNOSIS — E113313 Type 2 diabetes mellitus with moderate nonproliferative diabetic retinopathy with macular edema, bilateral: Secondary | ICD-10-CM | POA: Diagnosis not present

## 2019-06-10 DIAGNOSIS — H3581 Retinal edema: Secondary | ICD-10-CM | POA: Diagnosis not present

## 2019-06-13 MED ORDER — BEVACIZUMAB CHEMO INJECTION 1.25MG/0.05ML SYRINGE FOR KALEIDOSCOPE
1.2500 mg | INTRAVITREAL | Status: AC | PRN
  Administered 2019-06-13: 1.25 mg via INTRAVITREAL

## 2019-07-08 ENCOUNTER — Ambulatory Visit (INDEPENDENT_AMBULATORY_CARE_PROVIDER_SITE_OTHER): Payer: 59 | Admitting: Ophthalmology

## 2019-07-08 ENCOUNTER — Other Ambulatory Visit: Payer: Self-pay

## 2019-07-08 ENCOUNTER — Encounter (INDEPENDENT_AMBULATORY_CARE_PROVIDER_SITE_OTHER): Payer: Self-pay | Admitting: Ophthalmology

## 2019-07-08 DIAGNOSIS — I10 Essential (primary) hypertension: Secondary | ICD-10-CM | POA: Diagnosis not present

## 2019-07-08 DIAGNOSIS — E113313 Type 2 diabetes mellitus with moderate nonproliferative diabetic retinopathy with macular edema, bilateral: Secondary | ICD-10-CM | POA: Diagnosis not present

## 2019-07-08 DIAGNOSIS — H35033 Hypertensive retinopathy, bilateral: Secondary | ICD-10-CM

## 2019-07-08 DIAGNOSIS — H25813 Combined forms of age-related cataract, bilateral: Secondary | ICD-10-CM

## 2019-07-08 DIAGNOSIS — H40003 Preglaucoma, unspecified, bilateral: Secondary | ICD-10-CM

## 2019-07-08 DIAGNOSIS — H3581 Retinal edema: Secondary | ICD-10-CM | POA: Diagnosis not present

## 2019-07-08 NOTE — Progress Notes (Signed)
[facility] Note  07/08/2019     CHIEF COMPLAINT Patient presents for Retina Follow Up   HISTORY OF PRESENT ILLNESS: Kelly Barber is a 54 y.o. female who presents to the clinic today for:   HPI    Retina Follow Up    Patient presents with  Diabetic Retinopathy.  In both eyes.  This started 4 weeks ago.  Severity is moderate.  Duration of 4 weeks.  Since onset it is stable.  I, the attending physician,  performed the HPI with the patient and updated documentation appropriately.          Comments    54 y/o female pt here for 4 wk f/u for mod NPDR w/mac edema OU.  No change in [place] OU.  Denies pain, FOL, floaters.  No gtts.  BS 140 this a.m.  A1C 9.5       Last edited by Bernarda Caffey, MD on 07/08/2019  3:47 PM. (History)    pt states she could not tell whether or not the injections helped her vision or not, she states her glasses rx is not current and she would like to get new ones   Referring physician: Boyce Medici, FNP [place],  [place] [address]  HISTORICAL INFORMATION:   Selected notes from the MEDICAL RECORD NUMBER Referred by Dr. Zenia Resides for DME   CURRENT MEDICATIONS: No current outpatient medications on file. (Ophthalmic Drugs)   No current facility-administered medications for this visit. (Ophthalmic Drugs)   Current Outpatient Medications (Other)  Medication Sig  . acetaminophen (TYLENOL) 325 MG tablet Take 2 tablets (650 mg total) by mouth every 6 (six) hours as needed for mild pain (or Fever >/= 101).  Marland Kitchen ALPRAZolam (XANAX) 1 MG tablet Take 1 mg by mouth 4 (four) times daily as needed for anxiety.   Marland Kitchen amphetamine-dextroamphetamine (ADDERALL) 20 MG tablet Take 20 mg by mouth in the morning, at noon, in the evening, and at bedtime.   . calcium carbonate (OS-CAL - DOSED IN MG OF ELEMENTAL CALCIUM) 1250 (500 Ca) MG tablet Take 2 tablets (1,000 mg of elemental calcium total) by mouth 3 (three) times daily with meals.  .  clonazePAM (KLONOPIN) 1 MG tablet Take 1 mg by mouth 3 (three) times daily.  . clopidogrel (PLAVIX) 75 MG tablet Take 75 mg by mouth every evening.  . DULoxetine (CYMBALTA) 60 MG capsule Take 120 mg by mouth at bedtime.  Marland Kitchen levothyroxine (SYNTHROID) 88 MCG tablet Take 88 mcg by mouth every morning.  Marland Kitchen losartan (COZAAR) 25 MG tablet Take 25 mg by mouth daily.  Marland Kitchen OZEMPIC, 0.25 OR 0.5 MG/DOSE, 2 MG/1.5ML SOPN Inject 0.25 mg into the skin every Wednesday.   . pregabalin (LYRICA) 75 MG capsule Take 75 mg by mouth every evening.  . traMADol (ULTRAM) 50 MG tablet Take 1 tablet (50 mg total) by mouth every 6 (six) hours as needed (mild pain).  . traZODone (DESYREL) 50 MG tablet Take 50 mg by mouth at bedtime.  . ALPRAZolam (XANAX) 0.5 MG tablet Take 0.5 mg by mouth 3 (three) times daily as needed.   No current facility-administered medications for this visit. (Other)      REVIEW OF SYSTEMS: ROS    Positive for: Gastrointestinal, Endocrine, Eyes   Negative for: Constitutional, Neurological, Skin, Genitourinary, Musculoskeletal, HENT, Cardiovascular, Respiratory, Psychiatric, Allergic/Imm, Heme/Lymph   Last edited by Matthew Folks, COA on 07/08/2019  2:36 PM. (History)  ALLERGIES Allergies  Allergen Reactions  . Amoxicillin-Pot Clavulanate Shortness Of Breath    Has patient had a PCN reaction causing immediate rash, facial/tongue/throat swelling, SOB or lightheadedness with hypotension: Yes Has patient had a PCN reaction causing severe rash involving mucus membranes or skin necrosis: No Has patient had a PCN reaction that required hospitalization: Yes Has patient had a PCN reaction occurring within the last 10 years: No If all of the above answers are "NO", then may proceed with Cephalosporin use.   . Codeine Anaphylaxis  . Darvocet [Propoxyphene N-Acetaminophen] Anaphylaxis    Plain Tylenol ok  . Oxycodone Anaphylaxis  . Statins Other (See Comments)    Joint pain  . Propoxyphene      PAST MEDICAL HISTORY Past Medical History:  Diagnosis Date  . Anemia   . Anxiety    takes xanax daily  . Anxiety disorder   . Cataract    Mixed form OU  . Complication of anesthesia   . Depression, major    takes wellbutrin and celexa daily  . Diabetes mellitus    type 2   . Diabetic retinopathy ([place])    NPDR OU  . Fibromyalgia   . GERD (gastroesophageal reflux disease)    doesn't take any meds  . H/O hiatal hernia   . Hyperlipidemia   . Hypertension    takes Lisinopril and Diovan daily  . Hypertensive retinopathy    OU  . Hyperthyroidism    yrs ago but doesn't require meds for this  . Immune deficiency disorder ([facility])    pt states she has no clue what this is  . Insomnia   . Joint pain   . Joint swelling   . PTSD (post-traumatic stress disorder)   . PTSD (post-traumatic stress disorder)   . PVD (peripheral vascular disease) ([place])   . Sleep apnea    Past Surgical History:  Procedure Laterality Date  . ABDOMINAL AORTAGRAM N/A 08/10/2011   Procedure: ABDOMINAL [name];  Surgeon: Elam Dutch, MD;  Location: [facility] CATH LAB;  Service: Cardiovascular;  Laterality: N/A;  . AMPUTATION     lft foot toes 1.2.3  . AMPUTATION  01/07/2012   Procedure: AMPUTATION RAY;  Surgeon: Elam Dutch, MD;  Location: [facility] OR;  Service: Vascular;  Laterality: Right;  4TH & 5TH toes  . aortogram    . CHOLECYSTECTOMY  1990  . COLONOSCOPY    . DILATION AND CURETTAGE OF UTERUS    . FEMORAL BYPASS  04/29/2008   left  . FEMORAL-POPLITEAL BYPASS GRAFT  08/27/2011   Procedure: BYPASS GRAFT FEMORAL-POPLITEAL ARTERY;  Surgeon: Elam Dutch, MD;  Location: [place];  Service: Vascular;  Laterality: Right;  . FEMORAL-POPLITEAL BYPASS GRAFT  08/29/2011   Procedure: BYPASS GRAFT FEMORAL-POPLITEAL ARTERY;  Surgeon: Elam Dutch, MD;  Location: [facility] OR;  Service: Vascular;  Laterality: Right;  Vein Patch Angionplasty and Thrombectomy   . GASTRIC ROUX-EN-Y N/A 09/02/2017   Procedure: LAPAROSCOPIC  ROUX-EN-Y GASTRIC BYPASS WITH UPPER ENDOSCOPY AND ERAS PATHWAY;  Surgeon: Kinsinger, Arta Bruce, MD;  Location: [facility] ORS;  Service: General;  Laterality: N/A;  . INTRAOPERATIVE ARTERIOGRAM  08/29/2011   Procedure: INTRA OPERATIVE ARTERIOGRAM;  Surgeon: Elam Dutch, MD;  Location: [place];  Service: Vascular;  Laterality: Right;  . LOWER EXTREMITY ANGIOGRAM N/A 08/10/2011   Procedure: LOWER EXTREMITY ANGIOGRAM;  Surgeon: Elam Dutch, MD;  Location: [facility] CATH LAB;  Service: Cardiovascular;  Laterality: N/A;  . THYROIDECTOMY N/A 05/29/2019   Procedure: TOTAL  THYROIDECTOMY;  Surgeon: Armandina Gemma, MD;  Location: [facility] ORS;  Service: General;  Laterality: N/A;  . TOE AMPUTATION  07/12/2008    left foot toes 1,2,3  . TUBAL LIGATION    . VASCULAR SURGERY      FAMILY HISTORY Family History  Problem Relation Age of Onset  . Diabetes Mother   . Hyperlipidemia Mother   . Hypertension Mother   . Diabetes Father   . Heart disease Father        before age 94  . Hypertension Father   . Hyperlipidemia Father   . Hypertension Sister   . Diabetes Brother   . Glaucoma Paternal Grandfather     SOCIAL HISTORY Social History   Tobacco Use  . Smoking status: Former Smoker    Packs/day: 1.00    Years: 10.00    Pack years: 10.00    Types: Cigarettes    Quit date: 07/25/2008    Years since quitting: 10.9  . Smokeless tobacco: Never Used  . Tobacco comment: quit 26yr ago  Substance Use Topics  . Alcohol use: No  . Drug use: No         OPHTHALMIC EXAM:  Base Eye Exam    Visual Acuity (Snellen - Linear)      Right Left   Dist cc 20/25 -2 20/40   Dist ph cc NI 20/30 -2   Correction: Glasses       Tonometry (Tonopen, 2:43 PM)      Right Left   Pressure 16 19       Pupils      Dark Light Shape React APD   Right 3 2 Round Brisk None   Left 3 2 Round Brisk None       Visual Fields (Counting fingers)      Left Right    Full Full       Extraocular Movement      Right Left     Full, Ortho Full, Ortho       Neuro/Psych    Oriented x3: Yes   Mood/Affect: Normal       Dilation    Both eyes: 1.0% Mydriacyl, 2.5% Phenylephrine @ 2:43 PM        Slit Lamp and Fundus Exam    Slit Lamp Exam      Right Left   Lids/Lashes Dermatochalasis - upper lid Dermatochalasis - upper lid   Conjunctiva/Sclera Mild Melanosis Mild Melanosis   Cornea Punctate epithelial erosions Punctate epithelial erosions   Anterior Chamber Deep and clear, narrow temporal angle Deep and clear   Iris Round and dilated Round and dilated   Lens 2+ Nuclear sclerosis, 2+ Cortical cataract 2+ Nuclear sclerosis, 2+ Cortical cataract   Vitreous Vitreous syneresis Vitreous syneresis       Fundus Exam      Right Left   Disc Pink and Sharp, +cupping Pink and Sharp, central cupping and pallor   C/D Ratio 0.65 0.6   Macula Blunted foveal reflex, scattered Microaneurysms, focal Exudates nasal to fovea, scattered cystic changes -- stable from prior Blunted foveal reflex,+edema temporal to fovea -- persistent, scattered focal MA and exudate   Vessels Vascular attenuation, Tortuous Vascular attenuation, Tortuous   Periphery Attached, rare MA/DBH    Attached, scattered DBH greatest posteriorly, White without pressure temporally          IMAGING AND PROCEDURES  Imaging and Procedures for _0 @  OCT, Retina - OU - Both Eyes  Right Eye Quality was good. Central Foveal Thickness: 248. Progression has been stable. Findings include normal foveal contour, intraretinal fluid, no SRF, vitreomacular adhesion  (Scattered non-central IRF/cystic changes).   Left Eye Quality was good. Central Foveal Thickness: 266. Progression has been stable. Findings include normal foveal contour, intraretinal fluid, no SRF, intraretinal hyper-reflective material, vitreomacular adhesion  (Persistent +IRF temporal macula -- no significant improvement).   Notes *Images captured and stored on drive  Diagnosis /  Impression:  OD: NFP, no SRF, +IRF -- Scattered non-central IRF/cystic changes OS: NFP, no SRF, +IRF -- Persistent +IRF temporal macula -- no significant improvement  Clinical management:  See below  Abbreviations: NFP - Normal foveal profile. CME - cystoid macular edema. PED - pigment epithelial detachment. IRF - intraretinal fluid. SRF - subretinal fluid. EZ - ellipsoid zone. ERM - epiretinal membrane. ORA - outer retinal atrophy. ORT - outer retinal tubulation. SRHM - subretinal hyper-reflective material        Intravitreal Injection, Pharmacologic Agent - OS - Left Eye       Time Out 07/08/2019. 3:13 PM. Confirmed correct patient, procedure, site, and patient consented.   Anesthesia Topical anesthesia was used. Anesthetic medications included Lidocaine 2%, Proparacaine 0.5%.   Procedure Preparation included 5% betadine to ocular surface, eyelid speculum. A (32g) needle was used.   Injection:  1.25 mg Bevacizumab (AVASTIN) SOLN   NDC: 89211-941-74, Lot: [id number] , Expiration date: 11/30/2019   Route: Intravitreal, Site: Left Eye, Waste: 0 mL  Post-op Post injection exam found visual acuity of at least counting fingers. The patient tolerated the procedure well. There were no complications. The patient received written and verbal post procedure care education.        Fluorescein Angiography Optos (Transit OS)       Right Eye   Progression has no prior data. Early phase findings include microaneurysm. Mid/Late phase findings include leakage, microaneurysm (No NV).   Left Eye   Progression has no prior data. Early phase findings include delayed filling, microaneurysm (Delayed venous return). Mid/Late phase findings include leakage, window defect (No NV).   Notes **Images stored on drive**  Impression: Moderate NPDR OU Late leaking MA OU No NV OU                  ASSESSMENT/PLAN:    ICD-10-CM   1. Moderate nonproliferative diabetic retinopathy of  both eyes with macular edema associated with type 2 diabetes mellitus (HCC)  J62.8366 Intravitreal Injection, Pharmacologic Agent - OS - Left Eye    Bevacizumab (AVASTIN) SOLN 1.25 mg  2. Retinal edema  H35.81 OCT, Retina - OU - Both Eyes  3. Essential hypertension  I10   4. Hypertensive retinopathy of both eyes  H35.033 Fluorescein Angiography Optos (Transit OS)  5. Combined forms of age-related cataract of both eyes  H25.813   6. Glaucoma suspect of both eyes  H40.003     1,2. Moderate Non-proliferative diabetic retinopathy, OU  - exam shows scattered MA and exudate OU  - OCT shows diabetic macular edema, both eyes (OS > OD)   - s/p IVA OS #1 (04.07.21)  - BCVA 20/30-2 -- stable  - OCT shows persistent IRF temporal macula OS  - FA 5.5.21 w/ late leaking MA OU, no NV OU  - recommend IVA #2 OS today, 05.05.21  - pt wishes to proceed with injection  - RBA of procedure discussed, questions answered  - informed consent obtained  - IVA consent form signed and scanned, 04.07.21  -  see procedure note  - f/u in 4 wks, DFE, OCT, possible injection  3,4. Hypertensive retinopathy OU  - discussed importance of tight BP control  - monitor  5. Mixed form age related cataract OU  - The symptoms of cataract, surgical options, and treatments and risks were discussed with patient.  - discussed diagnosis and progression  - not yet visually significant  - monitor for now  6. Glaucoma Suspect  - +cupping  - IOP today: 16,19  - managed by Dr. Zenia Resides  - not on any drops    Ophthalmic Meds Ordered this visit:  Meds ordered this encounter  Medications  . Bevacizumab (AVASTIN) SOLN 1.25 mg       Return in about 4 weeks (around 08/05/2019) for f/u NPDR OU, DFE, OCT.  There are no Patient Instructions on file for this visit.   Explained the diagnoses, plan, and follow up with the patient and they expressed understanding.  Patient expressed understanding of the importance of proper follow  up care.   This document serves as a record of services personally performed by Gardiner Sleeper, MD, PhD. It was created on their behalf by Ernest Mallick, OA, an ophthalmic assistant. The creation of this record is the provider's dictation and/or activities during the visit.    Electronically signed by: Ernest Mallick, OA 05.05.2021 12:58 AM  Gardiner Sleeper, M.D., Ph.D. Diseases & Surgery of the Retina and [place] 07/08/2019   I have reviewed the above documentation for accuracy and completeness, and I agree with the above. Gardiner Sleeper, M.D., Ph.D. 07/11/19 12:58 AM    Abbreviations: M myopia (nearsighted); A astigmatism; H hyperopia (farsighted); P presbyopia; Mrx spectacle prescription;  CTL contact lenses; OD right eye; OS left eye; OU both eyes  XT exotropia; ET esotropia; PEK punctate epithelial keratitis; PEE punctate epithelial erosions; DES dry eye syndrome; MGD meibomian gland dysfunction; ATs artificial tears; PFAT's preservative free artificial tears; [place] nuclear sclerotic cataract; PSC posterior subcapsular cataract; ERM epi-retinal membrane; PVD posterior vitreous detachment; RD retinal detachment; DM diabetes mellitus; DR diabetic retinopathy; NPDR non-proliferative diabetic retinopathy; PDR proliferative diabetic retinopathy; CSME clinically significant macular edema; DME diabetic macular edema; dbh dot blot hemorrhages; CWS cotton wool spot; POAG primary open angle glaucoma; C/D cup-to-disc ratio; HVF humphrey visual field; GVF goldmann visual field; OCT optical coherence tomography; IOP intraocular pressure; BRVO Branch retinal vein occlusion; CRVO central retinal vein occlusion; CRAO central retinal artery occlusion; BRAO branch retinal artery occlusion; RT retinal tear; SB scleral buckle; PPV pars plana vitrectomy; VH Vitreous hemorrhage; PRP panretinal laser photocoagulation; IVK intravitreal kenalog; VMT vitreomacular traction; MH Macular  hole;  NVD neovascularization of the disc; NVE neovascularization elsewhere; AREDS age related eye disease study; ARMD age related macular degeneration; POAG primary open angle glaucoma; EBMD epithelial/anterior basement membrane dystrophy; ACIOL anterior chamber intraocular lens; IOL intraocular lens; PCIOL posterior chamber intraocular lens; Phaco/IOL phacoemulsification with intraocular lens placement; [place] photorefractive keratectomy; LASIK laser assisted in situ keratomileusis; HTN hypertension; DM diabetes mellitus; COPD chronic obstructive pulmonary disease

## 2019-07-11 MED ORDER — BEVACIZUMAB CHEMO INJECTION 1.25MG/0.05ML SYRINGE FOR KALEIDOSCOPE
1.2500 mg | INTRAVITREAL | Status: AC | PRN
  Administered 2019-07-11: 1.25 mg via INTRAVITREAL

## 2019-07-26 IMAGING — CR DG CHEST 2V
2 series · 2 of 2 positions shown · non-contrast
Comparison: Chest radiograph August 21, 2011

CLINICAL DATA: Preoperative evaluation for bariatric surgery.

EXAM:
CHEST  2 VIEW

[w chest pa]
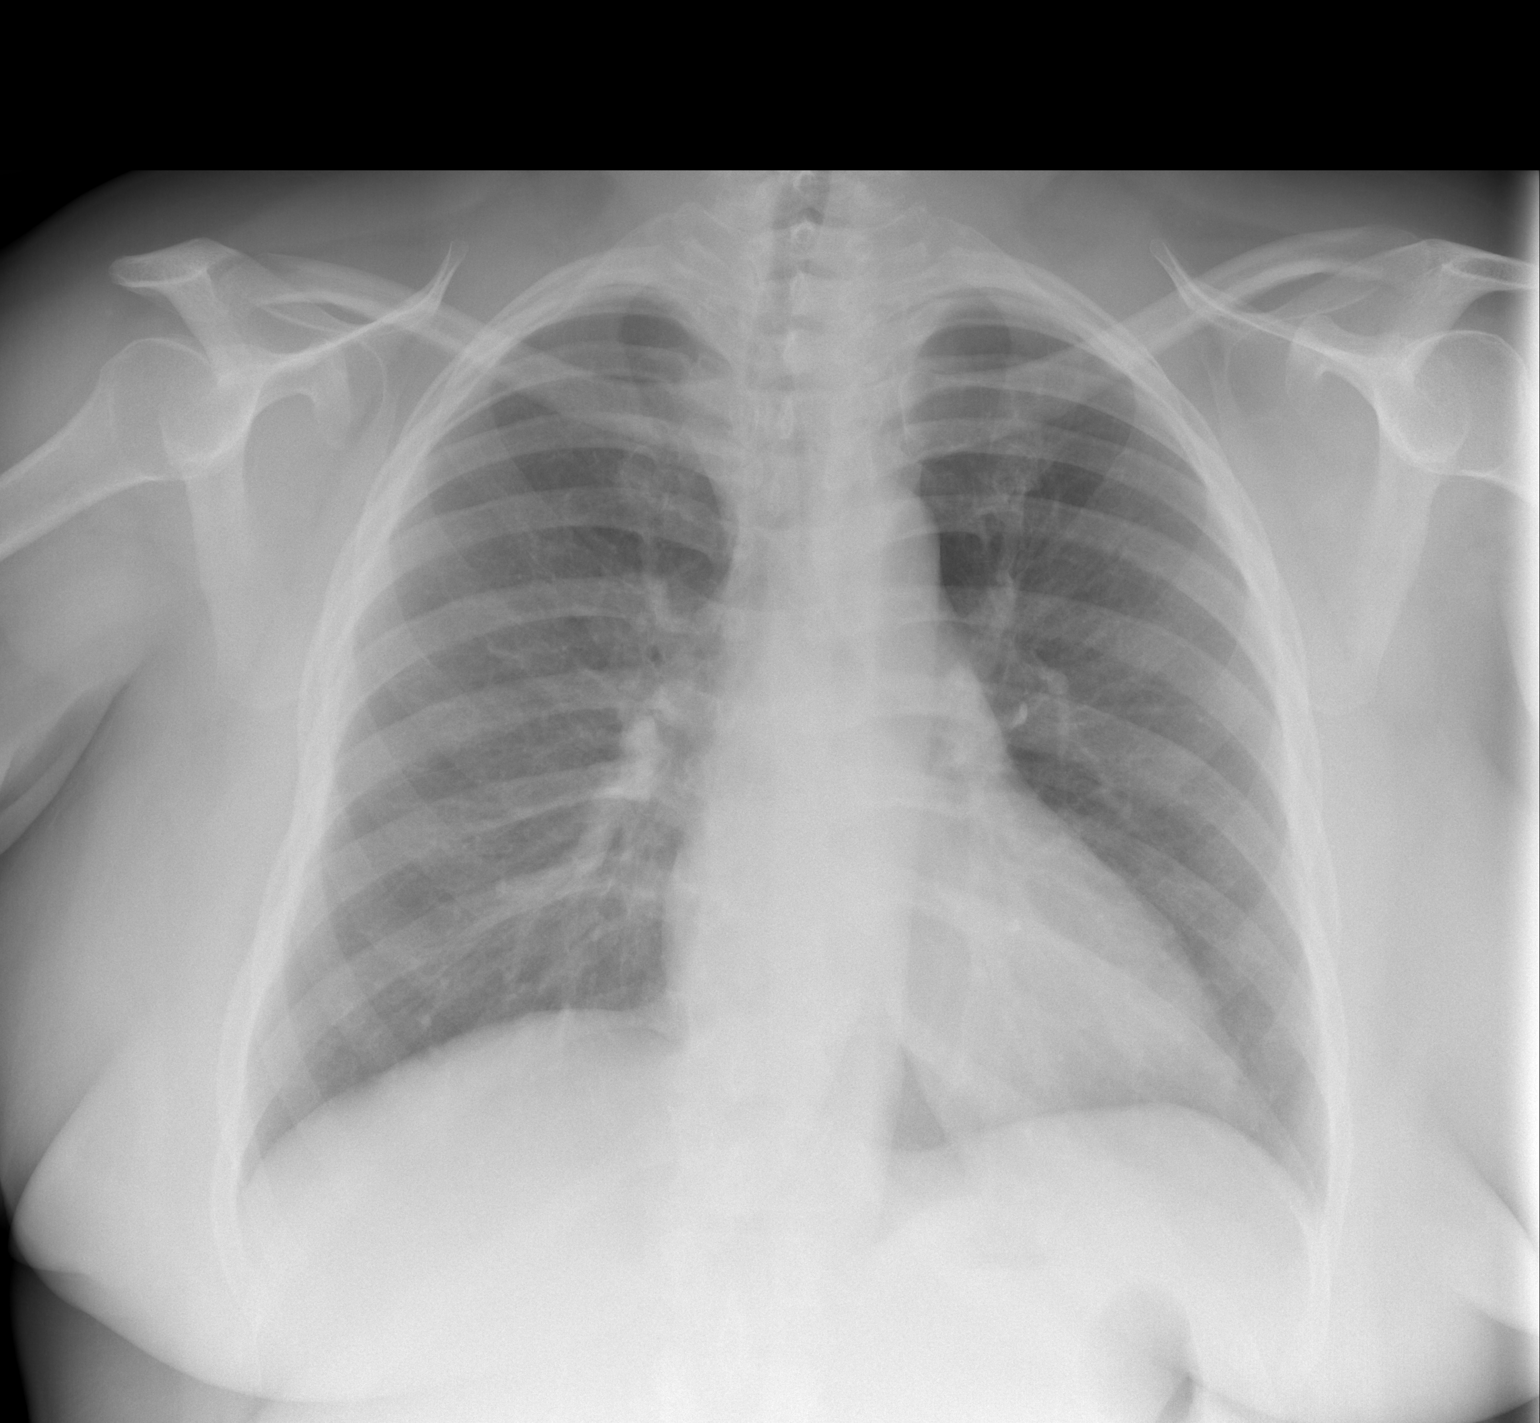

[w chest lat]
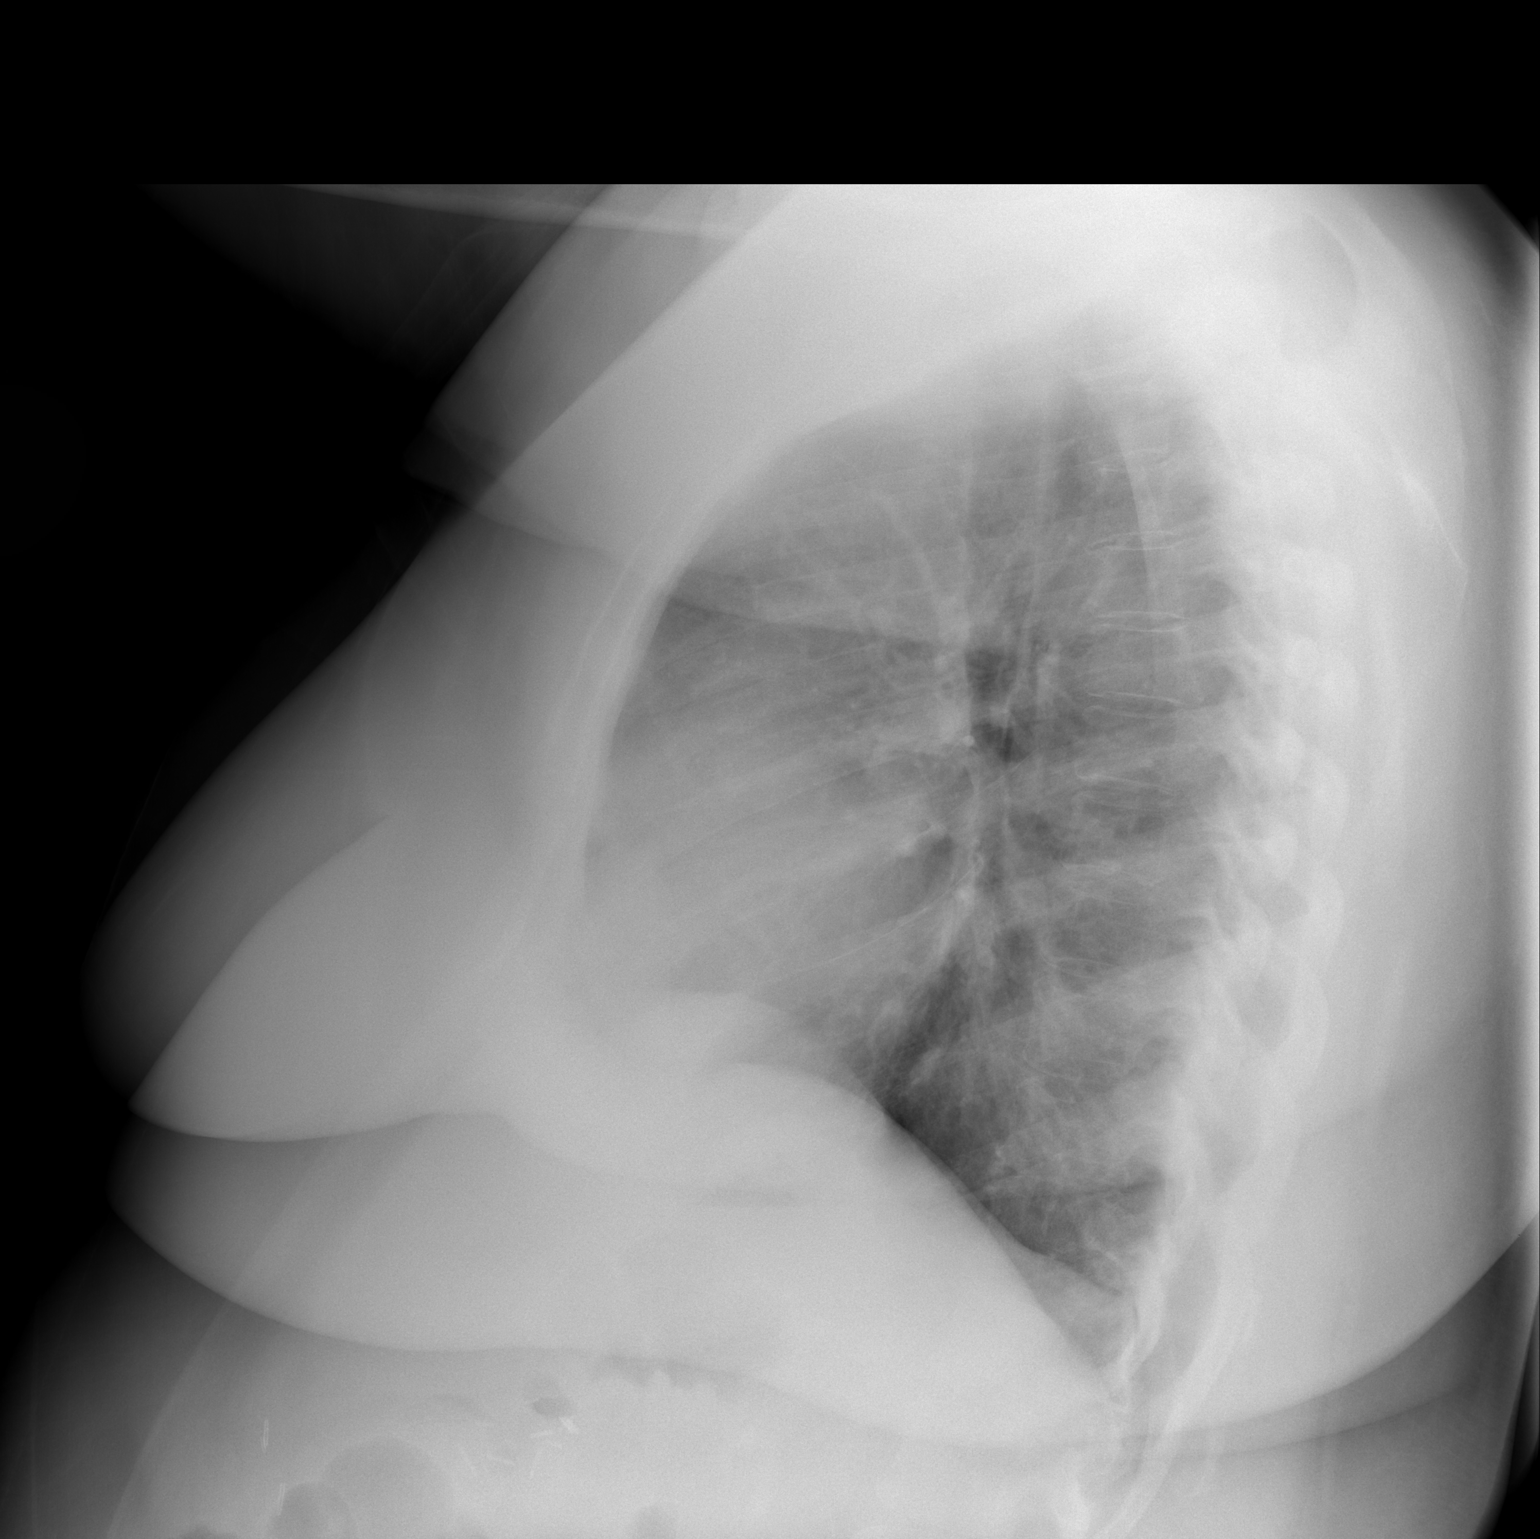

[2 of 2 positions shown; findings below may reference images not displayed]

FINDINGS: Cardiomediastinal silhouette is normal. No pleural effusions or
focal consolidations. Trachea projects midline and there is no
pneumothorax. Soft tissue planes and included osseous structures are
non-suspicious. Surgical clips in the included right abdomen
compatible with cholecystectomy.
IMPRESSION: Stable negative chest.

## 2019-08-04 NOTE — Progress Notes (Signed)
[facility] - Clinic Note  08/05/2019     CHIEF COMPLAINT Patient presents for Retina Follow Up   HISTORY OF PRESENT ILLNESS: Kelly Barber is a 54 y.o. female who presents to the clinic today for:   HPI    Retina Follow Up    Patient presents with  Diabetic Retinopathy.  In both eyes.  Duration of 1 month.  Since onset it is stable.  I, the attending physician,  performed the HPI with the patient and updated documentation appropriately.          Comments    4 week follow up NPDR OU- Vision appears "about the same" since last visit OU.  Denies pain, new floaters, FOLs.  BS: 190 Monday- restarted Metformin since last visit.  A1C: 10       Last edited by Rennis Chris, MD on 08/05/2019  3:53 PM. (History)    pt states her blood sugar has been out of control so she is unsure if the injections are helping her vision or not  Referring physician: Zachery Dauer, FNP [address] [place],  [place] [address]  HISTORICAL INFORMATION:   Selected notes from the MEDICAL RECORD NUMBER Referred by Dr. Laruth Bouchard for DME   CURRENT MEDICATIONS: No current outpatient medications on file. (Ophthalmic Drugs)   No current facility-administered medications for this visit. (Ophthalmic Drugs)   Current Outpatient Medications (Other)  Medication Sig  . acetaminophen (TYLENOL) 325 MG tablet Take 2 tablets (650 mg total) by mouth every 6 (six) hours as needed for mild pain (or Fever >/= 101).  Marland Kitchen ALPRAZolam (XANAX) 0.5 MG tablet Take 0.5 mg by mouth 3 (three) times daily as needed.  . ALPRAZolam (XANAX) 1 MG tablet Take 1 mg by mouth 4 (four) times daily as needed for anxiety.   Marland Kitchen amphetamine-dextroamphetamine (ADDERALL) 20 MG tablet Take 20 mg by mouth in the morning, at noon, in the evening, and at bedtime.   . calcium carbonate (OS-CAL - DOSED IN MG OF ELEMENTAL CALCIUM) 1250 (500 Ca) MG tablet Take 2 tablets (1,000 mg of elemental calcium total) by mouth 3 (three) times daily  with meals.  . clonazePAM (KLONOPIN) 1 MG tablet Take 1 mg by mouth 3 (three) times daily.  . clopidogrel (PLAVIX) 75 MG tablet Take 75 mg by mouth every evening.  . DULoxetine (CYMBALTA) 60 MG capsule Take 120 mg by mouth at bedtime.  Marland Kitchen levothyroxine (SYNTHROID) 88 MCG tablet Take 88 mcg by mouth every morning.  Marland Kitchen losartan (COZAAR) 25 MG tablet Take 25 mg by mouth daily.  Marland Kitchen OZEMPIC, 0.25 OR 0.5 MG/DOSE, 2 MG/1.5ML SOPN Inject 0.25 mg into the skin every Wednesday.   . pregabalin (LYRICA) 75 MG capsule Take 75 mg by mouth every evening.  . traMADol (ULTRAM) 50 MG tablet Take 1 tablet (50 mg total) by mouth every 6 (six) hours as needed (mild pain).  . traZODone (DESYREL) 50 MG tablet Take 50 mg by mouth at bedtime.   No current facility-administered medications for this visit. (Other)      REVIEW OF SYSTEMS: ROS    Positive for: Gastrointestinal, Musculoskeletal, Endocrine, Eyes, Respiratory, Psychiatric   Negative for: Constitutional, Neurological, Skin, Genitourinary, HENT, Cardiovascular, Allergic/Imm, Heme/Lymph   Last edited by Joni Reining, COA on 08/05/2019  3:10 PM. (History)       ALLERGIES Allergies  Allergen Reactions  . Amoxicillin-Pot Clavulanate Shortness Of Breath    Has patient had a PCN reaction causing immediate  rash, facial/tongue/throat swelling, SOB or lightheadedness with hypotension: Yes Has patient had a PCN reaction causing severe rash involving mucus membranes or skin necrosis: No Has patient had a PCN reaction that required hospitalization: Yes Has patient had a PCN reaction occurring within the last 10 years: No If all of the above answers are "NO", then may proceed with Cephalosporin use.   . Codeine Anaphylaxis  . Darvocet [Propoxyphene N-Acetaminophen] Anaphylaxis    Plain Tylenol ok  . Oxycodone Anaphylaxis  . Statins Other (See Comments)    Joint pain  . Propoxyphene     PAST MEDICAL HISTORY Past Medical History:  Diagnosis Date  . Anemia    . Anxiety    takes xanax daily  . Anxiety disorder   . Cataract    Mixed form OU  . Complication of anesthesia   . Depression, major    takes wellbutrin and celexa daily  . Diabetes mellitus    type 2   . Diabetic retinopathy (HCC)    NPDR OU  . Fibromyalgia   . GERD (gastroesophageal reflux disease)    doesn't take any meds  . H/O hiatal hernia   . Hyperlipidemia   . Hypertension    takes Lisinopril and Diovan daily  . Hypertensive retinopathy    OU  . Hyperthyroidism    yrs ago but doesn't require meds for this  . Immune deficiency disorder (HCC)    pt states she has no clue what this is  . Insomnia   . Joint pain   . Joint swelling   . PTSD (post-traumatic stress disorder)   . PTSD (post-traumatic stress disorder)   . PVD (peripheral vascular disease) (HCC)   . Sleep apnea    Past Surgical History:  Procedure Laterality Date  . ABDOMINAL AORTAGRAM N/A 08/10/2011   Procedure: ABDOMINAL Ronny FlurryAORTAGRAM;  Surgeon: Sherren Kernsharles E Fields, MD;  [facility];  Service: Cardiovascular;  Laterality: N/A;  . AMPUTATION     lft foot toes 1.2.3  . AMPUTATION  01/07/2012   Procedure: AMPUTATION RAY;  Surgeon: Sherren Kernsharles E Fields, MD;  [facility];  Service: Vascular;  Laterality: Right;  4TH & 5TH toes  . aortogram    . CHOLECYSTECTOMY  1990  . COLONOSCOPY    . DILATION AND CURETTAGE OF UTERUS    . FEMORAL BYPASS  04/29/2008   left  . FEMORAL-POPLITEAL BYPASS GRAFT  08/27/2011   Procedure: BYPASS GRAFT FEMORAL-POPLITEAL ARTERY;  Surgeon: Sherren Kernsharles E Fields, MD;  [facility];  Service: Vascular;  Laterality: Right;  . FEMORAL-POPLITEAL BYPASS GRAFT  08/29/2011   Procedure: BYPASS GRAFT FEMORAL-POPLITEAL ARTERY;  Surgeon: Sherren Kernsharles E Fields, MD;  [facility];  Service: Vascular;  Laterality: Right;  Vein Patch Angionplasty and Thrombectomy   . GASTRIC ROUX-EN-Y N/A 09/02/2017   Procedure: LAPAROSCOPIC ROUX-EN-Y GASTRIC BYPASS WITH UPPER ENDOSCOPY AND ERAS PATHWAY;  Surgeon:  Kinsinger, De BlanchLuke Aaron, MD;  Location: [facility] ORS;  Service: General;  Laterality: N/A;  . INTRAOPERATIVE ARTERIOGRAM  08/29/2011   Procedure: INTRA OPERATIVE ARTERIOGRAM;  Surgeon: Sherren Kernsharles E Fields, MD;  [facility];  Service: Vascular;  Laterality: Right;  . LOWER EXTREMITY ANGIOGRAM N/A 08/10/2011   Procedure: LOWER EXTREMITY ANGIOGRAM;  Surgeon: Sherren Kernsharles E Fields, MD;  [facility];  Service: Cardiovascular;  Laterality: N/A;  . THYROIDECTOMY N/A 05/29/2019   Procedure: TOTAL THYROIDECTOMY;  Surgeon: Darnell LevelGerkin, Todd, MD;  Location: [facility] ORS;  Service: General;  Laterality: N/A;  . TOE AMPUTATION  07/12/2008  left foot toes 1,2,3  . TUBAL LIGATION    . VASCULAR SURGERY      FAMILY HISTORY Family History  Problem Relation Age of Onset  . Diabetes Mother   . Hyperlipidemia Mother   . Hypertension Mother   . Diabetes Father   . Heart disease Father        before age 10  . Hypertension Father   . Hyperlipidemia Father   . Hypertension Sister   . Diabetes Brother   . Glaucoma Paternal Grandfather     SOCIAL HISTORY Social History   Tobacco Use  . Smoking status: Former Smoker    Packs/day: 1.00    Years: 10.00    Pack years: 10.00    Types: Cigarettes    Quit date: 07/25/2008    Years since quitting: 11.0  . Smokeless tobacco: Never Used  . Tobacco comment: quit 25yrs ago  Substance Use Topics  . Alcohol use: No  . Drug use: No         OPHTHALMIC EXAM:  Base Eye Exam    Visual Acuity (Snellen - Linear)      Right Left   Dist cc 20/25 -2 20/40   Dist ph cc  20/25 +1       Tonometry (Tonopen, 3:17 PM)      Right Left   Pressure 18 18       Pupils      Dark Light Shape React APD   Right 3 2 Round Brisk None   Left 3 2 Round Brisk None       Visual Fields (Counting fingers)      Left Right    Full Full       Extraocular Movement      Right Left    Full Full       Neuro/Psych    Oriented x3: Yes   Mood/Affect: Normal       Dilation    Both  eyes: 1.0% Mydriacyl, 2.5% Phenylephrine @ 3:17 PM        Slit Lamp and Fundus Exam    Slit Lamp Exam      Right Left   Lids/Lashes Dermatochalasis - upper lid Dermatochalasis - upper lid   Conjunctiva/Sclera Mild Melanosis Mild Melanosis   Cornea Punctate epithelial erosions Punctate epithelial erosions   Anterior Chamber Deep and clear, narrow temporal angle Deep and clear   Iris Round and dilated Round and dilated   Lens 2+ Nuclear sclerosis, 2+ Cortical cataract 2+ Nuclear sclerosis, 2+ Cortical cataract   Vitreous Vitreous syneresis Vitreous syneresis       Fundus Exam      Right Left   Disc Pink and Sharp, +cupping Pink and Sharp, central cupping and pallor   C/D Ratio 0.65 0.6   Macula Blunted foveal reflex, scattered Microaneurysms, focal Exudates nasal to fovea - improved, scattered cystic changes -- stable from prior Blunted foveal reflex,+edema temporal to fovea -- persistent, mild interval increase in nasal edema, scattered focal MA and exudate - persistent   Vessels Vascular attenuation, Tortuous Vascular attenuation, Tortuous   Periphery Attached, rare MA/DBH    Attached, scattered DBH greatest posteriorly, White without pressure temporally          IMAGING AND PROCEDURES  Imaging and Procedures for @TODAY @  OCT, Retina - OU - Both Eyes       Right Eye Quality was good. Central Foveal Thickness: 247. Progression has been stable. Findings include normal foveal contour, intraretinal fluid, no  SRF, vitreomacular adhesion  (Persistent non-central IRF/cystic changes).   Left Eye Quality was good. Central Foveal Thickness: 289. Progression has been stable. Findings include normal foveal contour, intraretinal fluid, no SRF, intraretinal hyper-reflective material, vitreomacular adhesion  (Mild interval increase in IRF nasal macula; temporal IRF stable).   Notes *Images captured and stored on drive  Diagnosis / Impression:  OD: NFP, no SRF, +IRF -- persistent  non-central IRF/cystic changes OS: NFP, no SRF, +IRF -- Mild interval increase in IRF nasal macula; temporal IRF stable  Clinical management:  See below  Abbreviations: NFP - Normal foveal profile. CME - cystoid macular edema. PED - pigment epithelial detachment. IRF - intraretinal fluid. SRF - subretinal fluid. EZ - ellipsoid zone. ERM - epiretinal membrane. ORA - outer retinal atrophy. ORT - outer retinal tubulation. SRHM - subretinal hyper-reflective material        Intravitreal Injection, Pharmacologic Agent - OS - Left Eye       Time Out 08/05/2019. 3:30 PM. Confirmed correct patient, procedure, site, and patient consented.   Anesthesia Topical anesthesia was used. Anesthetic medications included Lidocaine 2%, Proparacaine 0.5%.   Procedure Preparation included 5% betadine to ocular surface, eyelid speculum. A supplied needle was used.   Injection:  1.25 mg Bevacizumab (AVASTIN) SOLN   NDC: 16109-604-54, Lot: [id number] , Expiration date: 10/07/2019   Route: Intravitreal, Site: Left Eye, Waste: 0 mL  Post-op Post injection exam found visual acuity of at least counting fingers. The patient tolerated the procedure well. There were no complications. The patient received written and verbal post procedure care education.                 ASSESSMENT/PLAN:    ICD-10-CM   1. Moderate nonproliferative diabetic retinopathy of both eyes with macular edema associated with type 2 diabetes mellitus (HCC)  12/07/2019 Intravitreal Injection, Pharmacologic Agent - OS - Left Eye    Bevacizumab (AVASTIN) SOLN 1.25 mg  2. Retinal edema  H35.81 OCT, Retina - OU - Both Eyes  3. Essential hypertension  I10   4. Hypertensive retinopathy of both eyes  H35.033   5. Combined forms of age-related cataract of both eyes  H25.813   6. Glaucoma suspect of both eyes  H40.003     1,2. Moderate Non-proliferative diabetic retinopathy, OU  - s/p IVA OS #1 (04.07.21), #2 (05.05.21)  - pt reports BGs  uncontrolled  - exam shows scattered MA and exudate OU  - OCT shows diabetic macular edema, both eyes (OS > OD)   - BCVA 20/25 OU - improved  - OCT shows interval increase in IRF nasal macula; temporal IRF stable OS  - FA (05.05.21) w/ late leaking MA OU, no NV OU  - recommend IVA #3 OS today, 06.02.21  - pt wishes to proceed with injection  - RBA of procedure discussed, questions answered  - informed consent obtained  - IVA consent form signed and scanned, 04.07.21  - see procedure note  - f/u in 4 wks, DFE, OCT, possible injection  3,4. Hypertensive retinopathy OU  - discussed importance of tight BP control  - monitor  5. Mixed form age related cataract OU  - The symptoms of cataract, surgical options, and treatments and risks were discussed with patient.  - discussed diagnosis and progression  - not yet visually significant  - monitor for now  6. Glaucoma Suspect  - +cupping  - IOP today: 18 OU  - managed by Dr. 06.07.21  - not on any drops  Ophthalmic Meds Ordered this visit:  Meds ordered this encounter  Medications  . Bevacizumab (AVASTIN) SOLN 1.25 mg       Return in about 4 weeks (around 09/02/2019) for f/u NPDR OU, DFE, OCT.  There are no Patient Instructions on file for this visit.   Explained the diagnoses, plan, and follow up with the patient and they expressed understanding.  Patient expressed understanding of the importance of proper follow up care.   This document serves as a record of services personally performed by Gardiner Sleeper, MD, PhD. It was created on their behalf by Roselee Nova, COMT. The creation of this record is the provider's dictation and/or activities during the visit.  Electronically signed by: Roselee Nova, COMT 08/07/19 1:25 AM   This document serves as a record of services personally performed by Gardiner Sleeper, MD, PhD. It was created on their behalf by Ernest Mallick, OA, an ophthalmic assistant. The creation of this record is  the provider's dictation and/or activities during the visit.    Electronically signed by: Ernest Mallick, OA 06.02.2021 1:25 AM  Gardiner Sleeper, M.D., Ph.D. Diseases & Surgery of the Retina and [place] 08/05/2019   I have reviewed the above documentation for accuracy and completeness, and I agree with the above. Gardiner Sleeper, M.D., Ph.D. 08/07/19 1:27 AM    Abbreviations: M myopia (nearsighted); A astigmatism; H hyperopia (farsighted); P presbyopia; Mrx spectacle prescription;  CTL contact lenses; OD right eye; OS left eye; OU both eyes  XT exotropia; ET esotropia; PEK punctate epithelial keratitis; PEE punctate epithelial erosions; DES dry eye syndrome; MGD meibomian gland dysfunction; ATs artificial tears; PFAT's preservative free artificial tears; [place] nuclear sclerotic cataract; PSC posterior subcapsular cataract; ERM epi-retinal membrane; PVD posterior vitreous detachment; RD retinal detachment; DM diabetes mellitus; DR diabetic retinopathy; NPDR non-proliferative diabetic retinopathy; PDR proliferative diabetic retinopathy; CSME clinically significant macular edema; DME diabetic macular edema; dbh dot blot hemorrhages; CWS cotton wool spot; POAG primary open angle glaucoma; C/D cup-to-disc ratio; HVF humphrey visual field; GVF goldmann visual field; OCT optical coherence tomography; IOP intraocular pressure; BRVO Branch retinal vein occlusion; CRVO central retinal vein occlusion; CRAO central retinal artery occlusion; BRAO branch retinal artery occlusion; RT retinal tear; SB scleral buckle; PPV pars plana vitrectomy; VH Vitreous hemorrhage; PRP panretinal laser photocoagulation; IVK intravitreal kenalog; VMT vitreomacular traction; MH Macular hole;  NVD neovascularization of the disc; NVE neovascularization elsewhere; AREDS age related eye disease study; ARMD age related macular degeneration; POAG primary open angle glaucoma; EBMD epithelial/anterior basement  membrane dystrophy; ACIOL anterior chamber intraocular lens; IOL intraocular lens; PCIOL posterior chamber intraocular lens; Phaco/IOL phacoemulsification with intraocular lens placement; [place] photorefractive keratectomy; LASIK laser assisted in situ keratomileusis; HTN hypertension; DM diabetes mellitus; COPD chronic obstructive pulmonary disease

## 2019-08-05 ENCOUNTER — Encounter (INDEPENDENT_AMBULATORY_CARE_PROVIDER_SITE_OTHER): Payer: Self-pay | Admitting: Ophthalmology

## 2019-08-05 ENCOUNTER — Ambulatory Visit (INDEPENDENT_AMBULATORY_CARE_PROVIDER_SITE_OTHER): Payer: 59 | Admitting: Ophthalmology

## 2019-08-05 ENCOUNTER — Other Ambulatory Visit: Payer: Self-pay

## 2019-08-05 DIAGNOSIS — I10 Essential (primary) hypertension: Secondary | ICD-10-CM

## 2019-08-05 DIAGNOSIS — E113313 Type 2 diabetes mellitus with moderate nonproliferative diabetic retinopathy with macular edema, bilateral: Secondary | ICD-10-CM | POA: Diagnosis not present

## 2019-08-05 DIAGNOSIS — H3581 Retinal edema: Secondary | ICD-10-CM

## 2019-08-05 DIAGNOSIS — H35033 Hypertensive retinopathy, bilateral: Secondary | ICD-10-CM

## 2019-08-05 DIAGNOSIS — H40003 Preglaucoma, unspecified, bilateral: Secondary | ICD-10-CM

## 2019-08-05 DIAGNOSIS — H25813 Combined forms of age-related cataract, bilateral: Secondary | ICD-10-CM

## 2019-08-07 MED ORDER — BEVACIZUMAB CHEMO INJECTION 1.25MG/0.05ML SYRINGE FOR KALEIDOSCOPE
1.2500 mg | INTRAVITREAL | Status: AC | PRN
  Administered 2019-08-07: 1.25 mg via INTRAVITREAL

## 2019-08-27 DIAGNOSIS — M25511 Pain in right shoulder: Secondary | ICD-10-CM | POA: Insufficient documentation

## 2019-08-27 DIAGNOSIS — M25552 Pain in left hip: Secondary | ICD-10-CM | POA: Insufficient documentation

## 2019-08-27 DIAGNOSIS — M25512 Pain in left shoulder: Secondary | ICD-10-CM | POA: Insufficient documentation

## 2019-08-27 DIAGNOSIS — M25551 Pain in right hip: Secondary | ICD-10-CM | POA: Insufficient documentation

## 2019-09-03 ENCOUNTER — Ambulatory Visit: Payer: 59 | Admitting: Adult Health

## 2019-09-03 NOTE — Progress Notes (Signed)
[facility] - Clinic Note  09/10/2019     CHIEF COMPLAINT Patient presents for Retina Follow Up   HISTORY OF PRESENT ILLNESS: Kelly Barber is a 54 y.o. female who presents to the clinic today for:   HPI    Retina Follow Up    Patient presents with  Diabetic Retinopathy.  In both eyes.  This started 5 weeks ago.  Severity is moderate.  I, the attending physician,  performed the HPI with the patient and updated documentation appropriately.          Comments    Patient here for 5 weeks retina follow up for NPDR OU. Patient states vision doing ok. No eye pain.        Last edited by Rennis Chris, MD on 09/10/2019 10:16 PM. (History)    pt states her blood pressure meds have been increased and her blood pressure is doing much better  Referring physician: Zachery Dauer, FNP [address] [place],  [place] [address]  HISTORICAL INFORMATION:   Selected notes from the MEDICAL RECORD NUMBER Referred by Dr. Laruth Bouchard for DME   CURRENT MEDICATIONS: No current outpatient medications on file. (Ophthalmic Drugs)   No current facility-administered medications for this visit. (Ophthalmic Drugs)   Current Outpatient Medications (Other)  Medication Sig  . acetaminophen (TYLENOL) 325 MG tablet Take 2 tablets (650 mg total) by mouth every 6 (six) hours as needed for mild pain (or Fever >/= 101).  Marland Kitchen ALPRAZolam (XANAX) 0.5 MG tablet Take 0.5 mg by mouth 3 (three) times daily as needed.  . ALPRAZolam (XANAX) 1 MG tablet Take 1 mg by mouth 4 (four) times daily as needed for anxiety.   Marland Kitchen amphetamine-dextroamphetamine (ADDERALL) 20 MG tablet Take 20 mg by mouth in the morning, at noon, in the evening, and at bedtime.   . calcium carbonate (OS-CAL - DOSED IN MG OF ELEMENTAL CALCIUM) 1250 (500 Ca) MG tablet Take 2 tablets (1,000 mg of elemental calcium total) by mouth 3 (three) times daily with meals.  . clonazePAM (KLONOPIN) 1 MG tablet Take 1 mg by mouth 3 (three) times daily.   . clopidogrel (PLAVIX) 75 MG tablet Take 75 mg by mouth every evening.  . DULoxetine (CYMBALTA) 60 MG capsule Take 120 mg by mouth at bedtime.  Marland Kitchen levothyroxine (SYNTHROID) 88 MCG tablet Take 88 mcg by mouth every morning.  Marland Kitchen losartan (COZAAR) 25 MG tablet Take 25 mg by mouth daily.  Marland Kitchen OZEMPIC, 0.25 OR 0.5 MG/DOSE, 2 MG/1.5ML SOPN Inject 0.25 mg into the skin every Wednesday.   . pregabalin (LYRICA) 75 MG capsule Take 75 mg by mouth every evening.  . traMADol (ULTRAM) 50 MG tablet Take 1 tablet (50 mg total) by mouth every 6 (six) hours as needed (mild pain).  . traZODone (DESYREL) 50 MG tablet Take 50 mg by mouth at bedtime.   No current facility-administered medications for this visit. (Other)      REVIEW OF SYSTEMS: ROS    Positive for: Gastrointestinal, Musculoskeletal, Endocrine, Eyes, Respiratory, Psychiatric   Negative for: Constitutional, Neurological, Skin, Genitourinary, HENT, Cardiovascular, Allergic/Imm, Heme/Lymph   Last edited by Laddie Aquas, COA on 09/10/2019  9:47 AM. (History)       ALLERGIES Allergies  Allergen Reactions  . Amoxicillin-Pot Clavulanate Shortness Of Breath    Has patient had a PCN reaction causing immediate rash, facial/tongue/throat swelling, SOB or lightheadedness with hypotension: Yes Has patient had a PCN reaction causing severe rash involving mucus  membranes or skin necrosis: No Has patient had a PCN reaction that required hospitalization: Yes Has patient had a PCN reaction occurring within the last 10 years: No If all of the above answers are "NO", then may proceed with Cephalosporin use.   . Codeine Anaphylaxis  . Darvocet [Propoxyphene N-Acetaminophen] Anaphylaxis    Plain Tylenol ok  . Oxycodone Anaphylaxis  . Statins Other (See Comments)    Joint pain  . Propoxyphene     PAST MEDICAL HISTORY Past Medical History:  Diagnosis Date  . Anemia   . Anxiety    takes xanax daily  . Anxiety disorder   . Cataract    Mixed form OU   . Complication of anesthesia   . Depression, major    takes wellbutrin and celexa daily  . Diabetes mellitus    type 2   . Diabetic retinopathy (HCC)    NPDR OU  . Fibromyalgia   . GERD (gastroesophageal reflux disease)    doesn't take any meds  . H/O hiatal hernia   . Hyperlipidemia   . Hypertension    takes Lisinopril and Diovan daily  . Hypertensive retinopathy    OU  . Hyperthyroidism    yrs ago but doesn't require meds for this  . Immune deficiency disorder (HCC)    pt states she has no clue what this is  . Insomnia   . Joint pain   . Joint swelling   . PTSD (post-traumatic stress disorder)   . PTSD (post-traumatic stress disorder)   . PVD (peripheral vascular disease) (HCC)   . Sleep apnea    Past Surgical History:  Procedure Laterality Date  . ABDOMINAL AORTAGRAM N/A 08/10/2011   Procedure: ABDOMINAL Ronny Flurry;  Surgeon: Sherren Kerns, MD;  Location: [facility] CATH LAB;  Service: Cardiovascular;  Laterality: N/A;  . AMPUTATION     lft foot toes 1.2.3  . AMPUTATION  01/07/2012   Procedure: AMPUTATION RAY;  Surgeon: Sherren Kerns, MD;  Location: [facility] OR;  Service: Vascular;  Laterality: Right;  4TH & 5TH toes  . aortogram    . CHOLECYSTECTOMY  1990  . COLONOSCOPY    . DILATION AND CURETTAGE OF UTERUS    . FEMORAL BYPASS  04/29/2008   left  . FEMORAL-POPLITEAL BYPASS GRAFT  08/27/2011   Procedure: BYPASS GRAFT FEMORAL-POPLITEAL ARTERY;  Surgeon: Sherren Kerns, MD;  Location: [facility] OR;  Service: Vascular;  Laterality: Right;  . FEMORAL-POPLITEAL BYPASS GRAFT  08/29/2011   Procedure: BYPASS GRAFT FEMORAL-POPLITEAL ARTERY;  Surgeon: Sherren Kerns, MD;  Location: [facility] OR;  Service: Vascular;  Laterality: Right;  Vein Patch Angionplasty and Thrombectomy   . GASTRIC ROUX-EN-Y N/A 09/02/2017   Procedure: LAPAROSCOPIC ROUX-EN-Y GASTRIC BYPASS WITH UPPER ENDOSCOPY AND ERAS PATHWAY;  Surgeon: Kinsinger, De Blanch, MD;  Location: [facility] ORS;  Service: General;  Laterality: N/A;  .  INTRAOPERATIVE ARTERIOGRAM  08/29/2011   Procedure: INTRA OPERATIVE ARTERIOGRAM;  Surgeon: Sherren Kerns, MD;  Location: [facility] OR;  Service: Vascular;  Laterality: Right;  . LOWER EXTREMITY ANGIOGRAM N/A 08/10/2011   Procedure: LOWER EXTREMITY ANGIOGRAM;  Surgeon: Sherren Kerns, MD;  Location: [facility] CATH LAB;  Service: Cardiovascular;  Laterality: N/A;  . THYROIDECTOMY N/A 05/29/2019   Procedure: TOTAL THYROIDECTOMY;  Surgeon: Darnell Level, MD;  Location: [facility] ORS;  Service: General;  Laterality: N/A;  . TOE AMPUTATION  07/12/2008    left foot toes 1,2,3  . TUBAL LIGATION    . VASCULAR SURGERY  FAMILY HISTORY Family History  Problem Relation Age of Onset  . Diabetes Mother   . Hyperlipidemia Mother   . Hypertension Mother   . Diabetes Father   . Heart disease Father        before age 55  . Hypertension Father   . Hyperlipidemia Father   . Hypertension Sister   . Diabetes Brother   . Glaucoma Paternal Grandfather     SOCIAL HISTORY Social History   Tobacco Use  . Smoking status: Former Smoker    Packs/day: 1.00    Years: 10.00    Pack years: 10.00    Types: Cigarettes    Quit date: 07/25/2008    Years since quitting: 11.1  . Smokeless tobacco: Never Used  . Tobacco comment: quit 639yrs ago  Vaping Use  . Vaping Use: Never used  Substance Use Topics  . Alcohol use: No  . Drug use: No         OPHTHALMIC EXAM:  Base Eye Exam    Visual Acuity (Snellen - Linear)      Right Left   Dist cc 20/25 +1 20/30 -1   Dist ph cc NI 20/25 +1   Correction: Glasses       Tonometry (Tonopen, 9:44 AM)      Right Left   Pressure 16 19       Pupils      Dark Light Shape React APD   Right 3 2 Round Brisk None   Left 3 2 Round Brisk None       Visual Fields (Counting fingers)      Left Right    Full Full       Extraocular Movement      Right Left    Full, Ortho Full, Ortho       Neuro/Psych    Oriented x3: Yes   Mood/Affect: Normal       Dilation    Both eyes:  1.0% Mydriacyl, 2.5% Phenylephrine @ 9:44 AM        Slit Lamp and Fundus Exam    Slit Lamp Exam      Right Left   Lids/Lashes Dermatochalasis - upper lid Dermatochalasis - upper lid   Conjunctiva/Sclera Mild Melanosis Mild Melanosis   Cornea Punctate epithelial erosions Punctate epithelial erosions   Anterior Chamber Deep and clear, narrow temporal angle Deep and clear   Iris Round and dilated Round and dilated   Lens 2+ Nuclear sclerosis, 2+ Cortical cataract 2+ Nuclear sclerosis, 2+ Cortical cataract   Vitreous Vitreous syneresis Vitreous syneresis       Fundus Exam      Right Left   Disc Pink and Sharp, +cupping Pink and Sharp, central cupping and pallor   C/D Ratio 0.65 0.6   Macula Good foveal reflex, scattered Microaneurysms, focal Exudates nasal to fovea - improved, scattered cystic changes -- slightly improved Blunted foveal reflex,interval improvement in edema / cystic changes, scattered focal MA and exudate - persistent / slightly improved   Vessels Vascular attenuation, Tortuous Vascular attenuation, Tortuous, focal fibrosis along inferior venule around 0500 equator   Periphery Attached, rare MA/DBH    Attached, scattered DBH greatest posteriorly, White without pressure temporally        Refraction    Wearing Rx      Sphere Cylinder Axis Add   Right -8.25 +2.75 077 +2.25   Left -8.25 +2.50 087 +2.25          IMAGING AND PROCEDURES  Imaging and  Procedures for @TODAY @  OCT, Retina - OU - Both Eyes       Right Eye Quality was good. Central Foveal Thickness: 251. Progression has improved. Findings include normal foveal contour, intraretinal fluid, no SRF, vitreomacular adhesion  (Mild interval improvement in non-central IRF/cystic changes).   Left Eye Quality was good. Central Foveal Thickness: 289. Progression has been stable. Findings include normal foveal contour, intraretinal fluid, no SRF, intraretinal hyper-reflective material, vitreomacular adhesion   (persistent IRF, ?mild improvement).   Notes *Images captured and stored on drive  Diagnosis / Impression:  OD: NFP, no SRF, +IRF -- Mild interval improvement in non-central IRF/cystic changes OS: NFP, no SRF, +IRF -- persistent IRF, ?mild improvement  Clinical management:  See below  Abbreviations: NFP - Normal foveal profile. CME - cystoid macular edema. PED - pigment epithelial detachment. IRF - intraretinal fluid. SRF - subretinal fluid. EZ - ellipsoid zone. ERM - epiretinal membrane. ORA - outer retinal atrophy. ORT - outer retinal tubulation. SRHM - subretinal hyper-reflective material        Intravitreal Injection, Pharmacologic Agent - OS - Left Eye       Time Out 09/10/2019. 10:08 AM. Confirmed correct patient, procedure, site, and patient consented.   Anesthesia Topical anesthesia was used. Anesthetic medications included Lidocaine 2%, Proparacaine 0.5%.   Procedure Preparation included 5% betadine to ocular surface, eyelid speculum. A supplied needle was used.   Injection:  1.25 mg Bevacizumab (AVASTIN) SOLN   NDC: 11/11/2019, Lot: [id number] , Expiration date: 11/04/2019   Route: Intravitreal, Site: Left Eye, Waste: 0 mL  Post-op Post injection exam found visual acuity of at least counting fingers. The patient tolerated the procedure well. There were no complications. The patient received written and verbal post procedure care education.                 ASSESSMENT/PLAN:    ICD-10-CM   1. Moderate nonproliferative diabetic retinopathy of both eyes with macular edema associated with type 2 diabetes mellitus (HCC)  : [id number]$ Intravitreal Injection, Pharmacologic Agent - OS - Left Eye    Bevacizumab (AVASTIN) SOLN 1.25 mg  2. Retinal edema  H35.81 OCT, Retina - OU - Both Eyes  3. Essential hypertension  I10   4. Hypertensive retinopathy of both eyes  H35.033   5. Combined forms of age-related cataract of both eyes  H25.813   6. Glaucoma suspect of both eyes   H40.003     1,2. Moderate Non-proliferative diabetic retinopathy, OU  - s/p IVA OS #1 (04.07.21), #2 (05.05.21), #3 (06.02.21)  - FA (05.05.21) w/ late leaking MA OU, no NV OU  - pt reports BGs uncontrolled  - exam shows scattered MA and exudate OU  - OCT shows diabetic macular edema, both eyes (OS > OD)   - BCVA 20/25 OU - improved  - OCT shows mild interval improvement in IRF OU  - recommend IVA #4 OS today, 07.08.21  - pt wishes to proceed with injection  - informed consent obtained  - IVA consent form signed and scanned, 04.07.21  - see procedure note  - f/u in 4-5 wks, DFE, OCT, possible injection  3,4. Hypertensive retinopathy OU  - discussed importance of tight BP control  - monitor   5. Mixed form age related cataract OU  - The symptoms of cataract, surgical options, and treatments and risks were discussed with patient.  - discussed diagnosis and progression  - not yet visually significant  - monitor for now  6. Glaucoma Suspect  - +  cupping  - IOP today: 16,19  - managed by Dr. Laruth Bouchard  - patient is not on any drops    Ophthalmic Meds Ordered this visit:  Meds ordered this encounter  Medications  . Bevacizumab (AVASTIN) SOLN 1.25 mg       Return for f/u 4-5 weeks, NPDR OU, DFE, OCT.  There are no Patient Instructions on file for this visit.   Explained the diagnoses, plan, and follow up with the patient and they expressed understanding.  Patient expressed understanding of the importance of proper follow up care.   This document serves as a record of services personally performed by Karie Chimera, MD, PhD. It was created on their behalf by Joni Reining, an ophthalmic technician. The creation of this record is the provider's dictation and/or activities during the visit.    Electronically signed by: Joni Reining COA, 09/10/19  10:19 PM   This document serves as a record of services personally performed by Karie Chimera, MD, PhD. It was created on their  behalf by Glee Arvin. Manson Passey, COT, an ophthalmic technician. The creation of this record is the provider's dictation and/or activities during the visit.    Electronically signed by: Glee Arvin. Manson Passey, COT @TODAY @ 10:19 PM   , M.D., Ph.D. Diseases & Surgery of the Retina and Vitreous [facility]  I have reviewed the above documentation for accuracy and completeness, and I agree with the above. [facility], M.D., Ph.D. 09/10/19 10:19 PM   Abbreviations: M myopia (nearsighted); A astigmatism; H hyperopia (farsighted); P presbyopia; Mrx spectacle prescription;  CTL contact lenses; OD right eye; OS left eye; OU both eyes  XT exotropia; ET esotropia; PEK punctate epithelial keratitis; PEE punctate epithelial erosions; DES dry eye syndrome; MGD meibomian gland dysfunction; ATs artificial tears; PFAT's preservative free artificial tears; NSC nuclear sclerotic cataract; PSC posterior subcapsular cataract; ERM epi-retinal membrane; PVD posterior vitreous detachment; RD retinal detachment; DM diabetes mellitus; DR diabetic retinopathy; NPDR non-proliferative diabetic retinopathy; PDR proliferative diabetic retinopathy; CSME clinically significant macular edema; DME diabetic macular edema; dbh dot blot hemorrhages; CWS cotton wool spot; POAG primary open angle glaucoma; C/D cup-to-disc ratio; HVF humphrey visual field; GVF goldmann visual field; OCT optical coherence tomography; IOP intraocular pressure; BRVO Branch retinal vein occlusion; CRVO central retinal vein occlusion; CRAO central retinal artery occlusion; BRAO branch retinal artery occlusion; RT retinal tear; SB scleral buckle; PPV pars plana vitrectomy; VH Vitreous hemorrhage; PRP panretinal laser photocoagulation; IVK intravitreal kenalog; VMT vitreomacular traction; MH Macular hole;  NVD neovascularization of the disc; NVE neovascularization elsewhere; AREDS age related eye disease study; ARMD age related macular  degeneration; POAG primary open angle glaucoma; EBMD epithelial/anterior basement membrane dystrophy; ACIOL anterior chamber intraocular lens; IOL intraocular lens; PCIOL posterior chamber intraocular lens; Phaco/IOL phacoemulsification with intraocular lens placement; PRK photorefractive keratectomy; LASIK laser assisted in situ keratomileusis; HTN hypertension; DM diabetes mellitus; COPD chronic obstructive pulmonary disease

## 2019-09-10 ENCOUNTER — Encounter (INDEPENDENT_AMBULATORY_CARE_PROVIDER_SITE_OTHER): Payer: Self-pay | Admitting: Ophthalmology

## 2019-09-10 ENCOUNTER — Other Ambulatory Visit: Payer: Self-pay

## 2019-09-10 ENCOUNTER — Ambulatory Visit (INDEPENDENT_AMBULATORY_CARE_PROVIDER_SITE_OTHER): Payer: 59 | Admitting: Ophthalmology

## 2019-09-10 DIAGNOSIS — E113313 Type 2 diabetes mellitus with moderate nonproliferative diabetic retinopathy with macular edema, bilateral: Secondary | ICD-10-CM

## 2019-09-10 DIAGNOSIS — H40003 Preglaucoma, unspecified, bilateral: Secondary | ICD-10-CM

## 2019-09-10 DIAGNOSIS — H25813 Combined forms of age-related cataract, bilateral: Secondary | ICD-10-CM

## 2019-09-10 DIAGNOSIS — H35033 Hypertensive retinopathy, bilateral: Secondary | ICD-10-CM

## 2019-09-10 DIAGNOSIS — I10 Essential (primary) hypertension: Secondary | ICD-10-CM | POA: Diagnosis not present

## 2019-09-10 DIAGNOSIS — H3581 Retinal edema: Secondary | ICD-10-CM

## 2019-09-10 MED ORDER — BEVACIZUMAB CHEMO INJECTION 1.25MG/0.05ML SYRINGE FOR KALEIDOSCOPE
1.2500 mg | INTRAVITREAL | Status: AC | PRN
  Administered 2019-09-10: 1.25 mg via INTRAVITREAL

## 2019-10-12 ENCOUNTER — Encounter (INDEPENDENT_AMBULATORY_CARE_PROVIDER_SITE_OTHER): Payer: 59 | Admitting: Ophthalmology

## 2019-10-12 DIAGNOSIS — H35033 Hypertensive retinopathy, bilateral: Secondary | ICD-10-CM

## 2019-10-12 DIAGNOSIS — H3581 Retinal edema: Secondary | ICD-10-CM

## 2019-10-12 DIAGNOSIS — H25813 Combined forms of age-related cataract, bilateral: Secondary | ICD-10-CM

## 2019-10-12 DIAGNOSIS — I10 Essential (primary) hypertension: Secondary | ICD-10-CM

## 2019-10-12 DIAGNOSIS — E113313 Type 2 diabetes mellitus with moderate nonproliferative diabetic retinopathy with macular edema, bilateral: Secondary | ICD-10-CM

## 2019-10-12 DIAGNOSIS — H40003 Preglaucoma, unspecified, bilateral: Secondary | ICD-10-CM

## 2019-10-14 ENCOUNTER — Ambulatory Visit: Payer: 59 | Admitting: Family Medicine

## 2019-10-14 ENCOUNTER — Encounter: Payer: Self-pay | Admitting: Family Medicine

## 2019-10-14 NOTE — Progress Notes (Deleted)
PATIENT: MANDEE PLUTA DOB: 1965-05-26  REASON FOR VISIT: follow up HISTORY FROM: patient  No chief complaint on file.    HISTORY OF PRESENT ILLNESS: Today 10/14/19 IJANAE MACAPAGAL is a 54 y.o. female here today for follow up for OSA on CPAP.  She has had some difficulty with compliance.  She last used her CPAP machine Jul 25, 2019.  Compliance report dated 07/05/2019 through 08/03/2019 shows that she used CPAP 9 of the 30 days.  She used CPAP greater than 4 hours 8 of the 30 days for compliance of 27%.  Average usage on days used was 5 hours and 49 minutes.  Residual AHI was 4.4 on 5 to 12 cm of water and an EPR of 3.  There was no significant leak noted.  HISTORY: (copied from [place] note on 02/25/2019)  Ms. Kern is a 54 year old female with a history of obstructive sleep apnea on CPAP.  His download indicates that he uses machine 30 out of 30 days for compliance of 100%.  She uses machine greater then 4 hours 15 days for compliance of 50%.  On average she uses her machine 3 hours and 55 minutes.  Her residual AHI is 12.3 on 5-12 cm of water.  Her pressure in the 95th percentile is 11.8 with maximum pressure at 12.  She does not have a significant leak.  She reports that she is still trying to get used to using the CPAP machine.  She also states that she was recently diagnosed with a goiter and will be having surgery.  She returns today for an evaluation.  CT soft tissue neck with contrast: Diffuse enlargement of the thyroid gland, more on the left lobe than the right, without discernible focal nodule. Recent ultrasound showed a diffusely heterogeneous nature. The gland has enlarged further since the study of 2007. Cephalo caudal length of the right lobe at that time was 7.3 cm in the left lobe was 7.8 cm.  HISTORY 11/19/2018: She reports that she has lost quite a bit of weight, she estimates that she lost 60 to 70 pounds since her maximum weight, she had gastric bypass  surgery on 09/02/2017. She wants to lose more weight, her goal is to be in the 160s. She has developed issues with her thyroid, goiter, she may be looking at thyroidectomy. She has an appointment with the endocrinologist next week, she believes it is [name]. She had difficulty tolerating the CPAP, and was uncomfortable, she could not sleep and the position she wanted to, she took off the mask in the middle of the night. She lost coverage of her CPAP shortly before her bariatric surgery but does report she needed either additional oxygen or ventilatory support after her bariatric surgery as she recalls. She still snores.  The patient's allergies, current medications, family history, past medical history, past social history, past surgical history and problem list were reviewed and updated as appropriate.   REVIEW OF SYSTEMS: Out of a complete 14 system review of symptoms, the patient complains only of the following symptoms, and all other reviewed systems are negative.  ALLERGIES: Allergies  Allergen Reactions  . Amoxicillin-Pot Clavulanate Shortness Of Breath    Has patient had a PCN reaction causing immediate rash, facial/tongue/throat swelling, SOB or lightheadedness with hypotension: Yes Has patient had a PCN reaction causing severe rash involving mucus membranes or skin necrosis: No Has patient had a PCN reaction that required hospitalization: Yes Has patient had a PCN reaction  occurring within the last 10 years: No If all of the above answers are "NO", then may proceed with Cephalosporin use.   . Codeine Anaphylaxis  . Darvocet [Propoxyphene N-Acetaminophen] Anaphylaxis    Plain Tylenol ok  . Oxycodone Anaphylaxis  . Statins Other (See Comments)    Joint pain  . Propoxyphene     HOME MEDICATIONS: Outpatient Medications Prior to Visit  Medication Sig Dispense Refill  . acetaminophen (TYLENOL) 325 MG tablet Take 2 tablets (650 mg total) by mouth every 6 (six) hours as needed  for mild pain (or Fever >/= 101).    Marland Kitchen ALPRAZolam (XANAX) 0.5 MG tablet Take 0.5 mg by mouth 3 (three) times daily as needed.    . ALPRAZolam (XANAX) 1 MG tablet Take 1 mg by mouth 4 (four) times daily as needed for anxiety.   3  . amphetamine-dextroamphetamine (ADDERALL) 20 MG tablet Take 20 mg by mouth in the morning, at noon, in the evening, and at bedtime.     . calcium carbonate (OS-CAL - DOSED IN MG OF ELEMENTAL CALCIUM) 1250 (500 Ca) MG tablet Take 2 tablets (1,000 mg of elemental calcium total) by mouth 3 (three) times daily with meals. 100 tablet 0  . clonazePAM (KLONOPIN) 1 MG tablet Take 1 mg by mouth 3 (three) times daily.    . clopidogrel (PLAVIX) 75 MG tablet Take 75 mg by mouth every evening.    . DULoxetine (CYMBALTA) 60 MG capsule Take 120 mg by mouth at bedtime.    Marland Kitchen levothyroxine (SYNTHROID) 88 MCG tablet Take 88 mcg by mouth every morning.    Marland Kitchen losartan (COZAAR) 25 MG tablet Take 25 mg by mouth daily.    Marland Kitchen OZEMPIC, 0.25 OR 0.5 MG/DOSE, 2 MG/1.5ML SOPN Inject 0.25 mg into the skin every Wednesday.     . pregabalin (LYRICA) 75 MG capsule Take 75 mg by mouth every evening.    . traMADol (ULTRAM) 50 MG tablet Take 1 tablet (50 mg total) by mouth every 6 (six) hours as needed (mild pain). 10 tablet 0  . traZODone (DESYREL) 50 MG tablet Take 50 mg by mouth at bedtime.     No facility-administered medications prior to visit.    PAST MEDICAL HISTORY: Past Medical History:  Diagnosis Date  . Anemia   . Anxiety    takes xanax daily  . Anxiety disorder   . Cataract    Mixed form OU  . Complication of anesthesia   . Depression, major    takes wellbutrin and celexa daily  . Diabetes mellitus    type 2   . Diabetic retinopathy (HCC)    NPDR OU  . Fibromyalgia   . GERD (gastroesophageal reflux disease)    doesn't take any meds  . H/O hiatal hernia   . Hyperlipidemia   . Hypertension    takes Lisinopril and Diovan daily  . Hypertensive retinopathy    OU  .  Hyperthyroidism    yrs ago but doesn't require meds for this  . Immune deficiency disorder (HCC)    pt states she has no clue what this is  . Insomnia   . Joint pain   . Joint swelling   . PTSD (post-traumatic stress disorder)   . PTSD (post-traumatic stress disorder)   . PVD (peripheral vascular disease) (HCC)   . Sleep apnea     PAST SURGICAL HISTORY: Past Surgical History:  Procedure Laterality Date  . ABDOMINAL AORTAGRAM N/A 08/10/2011   Procedure: ABDOMINAL AORTAGRAM;  Surgeon:  Sherren Kerns, MD;  Location: [facility] CATH LAB;  Service: Cardiovascular;  Laterality: N/A;  . AMPUTATION     lft foot toes 1.2.3  . AMPUTATION  01/07/2012   Procedure: AMPUTATION RAY;  Surgeon: Sherren Kerns, MD;  Location: [facility] OR;  Service: Vascular;  Laterality: Right;  4TH & 5TH toes  . aortogram    . CHOLECYSTECTOMY  1990  . COLONOSCOPY    . DILATION AND CURETTAGE OF UTERUS    . FEMORAL BYPASS  04/29/2008   left  . FEMORAL-POPLITEAL BYPASS GRAFT  08/27/2011   Procedure: BYPASS GRAFT FEMORAL-POPLITEAL ARTERY;  Surgeon: Sherren Kerns, MD;  Location: [facility] OR;  Service: Vascular;  Laterality: Right;  . FEMORAL-POPLITEAL BYPASS GRAFT  08/29/2011   Procedure: BYPASS GRAFT FEMORAL-POPLITEAL ARTERY;  Surgeon: Sherren Kerns, MD;  Location: [facility] OR;  Service: Vascular;  Laterality: Right;  Vein Patch Angionplasty and Thrombectomy   . GASTRIC ROUX-EN-Y N/A 09/02/2017   Procedure: LAPAROSCOPIC ROUX-EN-Y GASTRIC BYPASS WITH UPPER ENDOSCOPY AND ERAS PATHWAY;  Surgeon: Kinsinger, De Blanch, MD;  Location: [facility] ORS;  Service: General;  Laterality: N/A;  . INTRAOPERATIVE ARTERIOGRAM  08/29/2011   Procedure: INTRA OPERATIVE ARTERIOGRAM;  Surgeon: Sherren Kerns, MD;  Location: [facility] OR;  Service: Vascular;  Laterality: Right;  . LOWER EXTREMITY ANGIOGRAM N/A 08/10/2011   Procedure: LOWER EXTREMITY ANGIOGRAM;  Surgeon: Sherren Kerns, MD;  Location: [facility] CATH LAB;  Service: Cardiovascular;  Laterality: N/A;  . THYROIDECTOMY  N/A 05/29/2019   Procedure: TOTAL THYROIDECTOMY;  Surgeon: Darnell Level, MD;  Location: [facility] ORS;  Service: General;  Laterality: N/A;  . TOE AMPUTATION  07/12/2008    left foot toes 1,2,3  . TUBAL LIGATION    . VASCULAR SURGERY      FAMILY HISTORY: Family History  Problem Relation Age of Onset  . Diabetes Mother   . Hyperlipidemia Mother   . Hypertension Mother   . Diabetes Father   . Heart disease Father        before age 82  . Hypertension Father   . Hyperlipidemia Father   . Hypertension Sister   . Diabetes Brother   . Glaucoma Paternal Grandfather     SOCIAL HISTORY: Social History   Socioeconomic History  . Marital status: Divorced    Spouse name: Not on file  . Number of children: Not on file  . Years of education: Not on file  . Highest education level: Not on file  Occupational History  . Not on file  Tobacco Use  . Smoking status: Former Smoker    Packs/day: 1.00    Years: 10.00    Pack years: 10.00    Types: Cigarettes    Quit date: 07/25/2008    Years since quitting: 11.2  . Smokeless tobacco: Never Used  . Tobacco comment: quit 9yrs ago  Vaping Use  . Vaping Use: Never used  Substance and Sexual Activity  . Alcohol use: No  . Drug use: No  . Sexual activity: Not Currently  Other Topics Concern  . Not on file  Social History Narrative  . Not on file   Social Determinants of Health   Financial Resource Strain:   . Difficulty of Paying Living Expenses:   Food Insecurity:   . Worried About Programme researcher, broadcasting/film/video in the Last Year:   . Barista in the Last Year:   Transportation Needs:   . Freight forwarder (Medical):   Marland Kitchen Lack of Transportation (Non-Medical):  Physical Activity:   . Days of Exercise per Week:   . Minutes of Exercise per Session:   Stress:   . Feeling of Stress :   Social Connections:   . Frequency of Communication with Friends and Family:   . Frequency of Social Gatherings with Friends and Family:   . Attends  Religious Services:   . Active Member of Clubs or Organizations:   . Attends Banker Meetings:   Marland Kitchen Marital Status:   Intimate Partner Violence:   . Fear of Current or Ex-Partner:   . Emotionally Abused:   Marland Kitchen Physically Abused:   . Sexually Abused:       PHYSICAL EXAM  There were no vitals filed for this visit. There is no height or weight on file to calculate BMI.  Generalized: Well developed, in no acute distress  Cardiology: normal rate and rhythm, no murmur noted Respiratory: clear to auscultation bilaterally  Neurological examination  Mentation: Alert oriented to time, place, history taking. Follows all commands speech and language fluent Cranial nerve II-XII: Pupils were equal round reactive to light. Extraocular movements were full, visual field were full on confrontational test. Facial sensation and strength were normal. Uvula tongue midline. Head turning and shoulder shrug  were normal and symmetric. Motor: The motor testing reveals 5 over 5 strength of all 4 extremities. Good symmetric motor tone is noted throughout.  Sensory: Sensory testing is intact to soft touch on all 4 extremities. No evidence of extinction is noted.  Coordination: Cerebellar testing reveals good finger-nose-finger and heel-to-shin bilaterally.  Gait and station: Gait is normal. Tandem gait is normal. Romberg is negative. No drift is seen.  Reflexes: Deep tendon reflexes are symmetric and normal bilaterally.   DIAGNOSTIC DATA (LABS, IMAGING, TESTING) - I reviewed patient records, labs, notes, testing and imaging myself where available.  No flowsheet data found.   Lab Results  Component Value Date   WBC 5.9 05/25/2019   HGB 12.5 05/25/2019   HCT 39.7 05/25/2019   MCV 95.0 05/25/2019   PLT 249 05/25/2019      Component Value Date/Time   NA 137 05/30/2019 0530   K 5.4 (H) 05/30/2019 0530   CL 101 05/30/2019 0530   CO2 26 05/30/2019 0530   GLUCOSE 232 (H) 05/30/2019 0530   BUN  28 (H) 05/30/2019 0530   CREATININE 1.12 (H) 05/30/2019 0530   CALCIUM 8.4 (L) 05/30/2019 0530   PROT 7.2 10/16/2017 1135   ALBUMIN 3.3 (L) 10/16/2017 1135   AST 19 10/16/2017 1135   ALT 22 10/16/2017 1135   ALKPHOS 78 10/16/2017 1135   BILITOT 1.1 10/16/2017 1135   GFRNONAA 56 (L) 05/30/2019 0530   GFRAA >60 05/30/2019 0530   Lab Results  Component Value Date   CHOL 197 06/16/2014   HDL 30 (L) 06/16/2014   LDLCALC 125 (H) 06/16/2014   TRIG 211 (H) 06/16/2014   CHOLHDL 6.6 06/16/2014   Lab Results  Component Value Date   HGBA1C 9.5 (H) 05/25/2019   No results found for: VITAMINB12 Lab Results  Component Value Date   TSH 0.605 02/23/2008       ASSESSMENT AND PLAN 54 y.o. year old female  has a past medical history of Anemia, Anxiety, Anxiety disorder, Cataract, Complication of anesthesia, Depression, major, Diabetes mellitus, Diabetic retinopathy (HCC), Fibromyalgia, GERD (gastroesophageal reflux disease), H/O hiatal hernia, Hyperlipidemia, Hypertension, Hypertensive retinopathy, Hyperthyroidism, Immune deficiency disorder (HCC), Insomnia, Joint pain, Joint swelling, PTSD (post-traumatic stress disorder), PTSD (post-traumatic stress disorder),  PVD (peripheral vascular disease) (HCC), and Sleep apnea. here with ***  No diagnosis found.     No orders of the defined types were placed in this encounter.    No orders of the defined types were placed in this encounter.     I spent 15 minutes with the patient. 50% of this time was spent counseling and educating patient on plan of care and medications.    Shawnie Dappermy Torii Royse, FNP-C 10/14/2019, 12:06 PM [facility] [address], [address] [place], [place] [address] [phone]

## 2019-11-12 ENCOUNTER — Encounter (INDEPENDENT_AMBULATORY_CARE_PROVIDER_SITE_OTHER): Payer: 59 | Admitting: Ophthalmology

## 2019-12-24 ENCOUNTER — Encounter: Payer: Self-pay | Admitting: Family Medicine

## 2019-12-24 ENCOUNTER — Ambulatory Visit (INDEPENDENT_AMBULATORY_CARE_PROVIDER_SITE_OTHER): Payer: 59 | Admitting: Family Medicine

## 2019-12-24 VITALS — BP 156/96 | HR 82 | Ht 67.0 in | Wt 201.0 lb

## 2019-12-24 DIAGNOSIS — G4733 Obstructive sleep apnea (adult) (pediatric): Secondary | ICD-10-CM | POA: Diagnosis not present

## 2019-12-24 DIAGNOSIS — Z9989 Dependence on other enabling machines and devices: Secondary | ICD-10-CM

## 2019-12-24 NOTE — Patient Instructions (Signed)
Please continue using your CPAP regularly. While your insurance requires that you use CPAP at least 4 hours each night on 70% of the nights, I recommend, that you not skip any nights and use it throughout the night if you can. Getting used to CPAP and staying with the treatment long term does take time and patience and discipline. Untreated obstructive sleep apnea when it is moderate to severe can have an adverse impact on cardiovascular health and raise her risk for heart disease, arrhythmias, hypertension, congestive heart failure, stroke and diabetes. Untreated obstructive sleep apnea causes sleep disruption, nonrestorative sleep, and sleep deprivation. This can have an impact on your day to day functioning and cause daytime sleepiness and impairment of cognitive function, memory loss, mood disturbance, and problems focussing. Using CPAP regularly can improve these symptoms.  Continue to focus on using CPAP nightly and greater than 4 hours each night  Follow up with me in 3 months  Sleep Apnea Sleep apnea affects breathing during sleep. It causes breathing to stop for a short time or to become shallow. It can also increase the risk of:  Heart attack.  Stroke.  Being very overweight (obese).  Diabetes.  Heart failure.  Irregular heartbeat. The goal of treatment is to help you breathe normally again. What are the causes? There are three kinds of sleep apnea:  Obstructive sleep apnea. This is caused by a blocked or collapsed airway.  Central sleep apnea. This happens when the brain does not send the right signals to the muscles that control breathing.  Mixed sleep apnea. This is a combination of obstructive and central sleep apnea. The most common cause of this condition is a collapsed or blocked airway. This can happen if:  Your throat muscles are too relaxed.  Your tongue and tonsils are too large.  You are overweight.  Your airway is too small. What increases the  risk?  Being overweight.  Smoking.  Having a small airway.  Being older.  Being female.  Drinking alcohol.  Taking medicines to calm yourself (sedatives or tranquilizers).  Having family members with the condition. What are the signs or symptoms?  Trouble staying asleep.  Being sleepy or tired during the day.  Getting angry a lot.  Loud snoring.  Headaches in the morning.  Not being able to focus your mind (concentrate).  Forgetting things.  Less interest in sex.  Mood swings.  Personality changes.  Feelings of sadness (depression).  Waking up a lot during the night to pee (urinate).  Dry mouth.  Sore throat. How is this diagnosed?  Your medical history.  A physical exam.  A test that is done when you are sleeping (sleep study). The test is most often done in a sleep lab but may also be done at home. How is this treated?   Sleeping on your side.  Using a medicine to get rid of mucus in your nose (decongestant).  Avoiding the use of alcohol, medicines to help you relax, or certain pain medicines (narcotics).  Losing weight, if needed.  Changing your diet.  Not smoking.  Using a machine to open your airway while you sleep, such as: ? An oral appliance. This is a mouthpiece that shifts your lower jaw forward. ? A CPAP device. This device blows air through a mask when you breathe out (exhale). ? An EPAP device. This has valves that you put in each nostril. ? A BPAP device. This device blows air through a mask when you breathe in (  inhale) and breathe out.  Having surgery if other treatments do not work. It is important to get treatment for sleep apnea. Without treatment, it can lead to:  High blood pressure.  Coronary artery disease.  In men, not being able to have an erection (impotence).  Reduced thinking ability. Follow these instructions at home: Lifestyle  Make changes that your doctor recommends.  Eat a healthy diet.  Lose  weight if needed.  Avoid alcohol, medicines to help you relax, and some pain medicines.  Do not use any products that contain nicotine or tobacco, such as cigarettes, e-cigarettes, and chewing tobacco. If you need help quitting, ask your doctor. General instructions  Take over-the-counter and prescription medicines only as told by your doctor.  If you were given a machine to use while you sleep, use it only as told by your doctor.  If you are having surgery, make sure to tell your doctor you have sleep apnea. You may need to bring your device with you.  Keep all follow-up visits as told by your doctor. This is important. Contact a doctor if:  The machine that you were given to use during sleep bothers you or does not seem to be working.  You do not get better.  You get worse. Get help right away if:  Your chest hurts.  You have trouble breathing in enough air.  You have an uncomfortable feeling in your back, arms, or stomach.  You have trouble talking.  One side of your body feels weak.  A part of your face is hanging down. These symptoms may be an emergency. Do not wait to see if the symptoms will go away. Get medical help right away. Call your local emergency services (911 in the [place] Do not drive yourself to the hospital. Summary  This condition affects breathing during sleep.  The most common cause is a collapsed or blocked airway.  The goal of treatment is to help you breathe normally while you sleep. This information is not intended to replace advice given to you by your health care provider. Make sure you discuss any questions you have with your health care provider. Document Revised: 12/06/2017 Document Reviewed: 10/15/2017 Elsevier Patient Education  2020 [employer].

## 2019-12-24 NOTE — Progress Notes (Addendum)
PATIENT: Kelly Barber DOB: 1965/05/29  REASON FOR VISIT: follow up HISTORY FROM: patient  Chief Complaint  Patient presents with  . Follow-up    rm 7  . Sleep Apnea    Pt said she is still snoring while using her cpap.     HISTORY OF PRESENT ILLNESS: Today 12/24/19 Kelly Barber is a 54 y.o. female here today for follow up for OSA on CPAP.  She has not used her CPAP machine consistently since last being seen in December 2020.  She does wish to resume therapy as she notes improved sleep quality when using CPAP.  She denies any difficulty with her machine or the mask.    Compliance report dated 09/24/2019 through 12/22/2019 reveals that she used CPAP for of the past 90 days for compliance of 4%.  She used CPAP greater than 4 hours 1 day.  There was no leak noted.   HISTORY: (copied from Kelly Barber on 02/25/2019)  Ms. West is a 54 year old female with a history of obstructive sleep apnea on CPAP.  His download indicates that he uses machine 30 out of 30 days for compliance of 100%.  She uses machine greater then 4 hours 15 days for compliance of 50%.  On average she uses her machine 3 hours and 55 minutes.  Her residual AHI is 12.3 on 5-12 cm of water.  Her pressure in the 95th percentile is 11.8 with maximum pressure at 12.  She does not have a significant leak.  She reports that she is still trying to get used to using the CPAP machine.  She also states that she was recently diagnosed with a goiter and will be having surgery.  She returns today for an evaluation.  CT soft tissue neck with contrast: Diffuse enlargement of the thyroid gland, more on the left lobe than the right, without discernible focal nodule. Recent ultrasound showed a diffusely heterogeneous nature. The gland has enlarged further since the study of 2007. Cephalo caudal length of the right lobe at that time was 7.3 cm in the left lobe was 7.8 cm.  HISTORY 11/19/2018: She reports that she has  lost quite a bit of weight, she estimates that she lost 60 to 70 pounds since her maximum weight, she had gastric bypass surgery on 09/02/2017. She wants to lose more weight, her goal is to be in the 160s. She has developed issues with her thyroid, goiter, she may be looking at thyroidectomy. She has an appointment with the endocrinologist next week, she believes it is Kelly Barber. She had difficulty tolerating the CPAP, and was uncomfortable, she could not sleep and the position she wanted to, she took off the mask in the middle of the night. She lost coverage of her CPAP shortly before her bariatric surgery but does report she needed either additional oxygen or ventilatory support after her bariatric surgery as she recalls. She still snores.  The patient's allergies, current medications, family history, past medical history, past social history, past surgical history and problem list were reviewed and updated as appropriate.   REVIEW OF SYSTEMS: Out of a complete 14 system review of symptoms, the patient complains only of the following symptoms, fatigue and all other reviewed systems are negative.  ESS: 10 Fatigue: 63   ALLERGIES: Allergies  Allergen Reactions  . Amoxicillin-Pot Clavulanate Shortness Of Breath    Has patient had a PCN reaction causing immediate rash, facial/tongue/throat swelling, SOB or lightheadedness with hypotension: Yes Has patient  had a PCN reaction causing severe rash involving mucus membranes or skin necrosis: No Has patient had a PCN reaction that required hospitalization: Yes Has patient had a PCN reaction occurring within the last 10 years: No If all of the above answers are "NO", then may proceed with Cephalosporin use.   . Codeine Anaphylaxis  . Darvocet [Propoxyphene N-Acetaminophen] Anaphylaxis    Plain Tylenol ok  . Oxycodone Anaphylaxis  . Statins Other (See Comments)    Joint pain  . Propoxyphene     HOME MEDICATIONS: Outpatient Medications  Prior to Visit  Medication Sig Dispense Refill  . acetaminophen (TYLENOL) 325 MG tablet Take 2 tablets (650 mg total) by mouth every 6 (six) hours as needed for mild pain (or Fever >/= 101).    Marland Kitchen ALPRAZolam (XANAX) 0.5 MG tablet Take 0.5 mg by mouth 3 (three) times daily as needed.    . calcium carbonate (OS-CAL - DOSED IN MG OF ELEMENTAL CALCIUM) 1250 (500 Ca) MG tablet Take 2 tablets (1,000 mg of elemental calcium total) by mouth 3 (three) times daily with meals. 100 tablet 0  . clonazePAM (KLONOPIN) 1 MG tablet Take 1 mg by mouth 3 (three) times daily.    . clopidogrel (PLAVIX) 75 MG tablet Take 75 mg by mouth every evening.    . DULoxetine (CYMBALTA) 60 MG capsule Take 120 mg by mouth at bedtime.    Marland Kitchen levothyroxine (SYNTHROID) 88 MCG tablet Take 88 mcg by mouth every morning.    Marland Kitchen losartan (COZAAR) 25 MG tablet Take 25 mg by mouth daily.    Marland Kitchen OZEMPIC, 0.25 OR 0.5 MG/DOSE, 2 MG/1.5ML SOPN Inject 0.25 mg into the skin every Wednesday.     . pregabalin (LYRICA) 75 MG capsule Take 75 mg by mouth every evening.    . traMADol (ULTRAM) 50 MG tablet Take 1 tablet (50 mg total) by mouth every 6 (six) hours as needed (mild pain). 10 tablet 0  . ALPRAZolam (XANAX) 1 MG tablet Take 1 mg by mouth 4 (four) times daily as needed for anxiety.   3  . amphetamine-dextroamphetamine (ADDERALL) 20 MG tablet Take 20 mg by mouth in the morning, at noon, in the evening, and at bedtime.     . traZODone (DESYREL) 50 MG tablet Take 50 mg by mouth at bedtime.     No facility-administered medications prior to visit.    PAST MEDICAL HISTORY: Past Medical History:  Diagnosis Date  . Anemia   . Anxiety    takes xanax daily  . Anxiety disorder   . Cataract    Mixed form OU  . Complication of anesthesia   . Depression, major    takes wellbutrin and celexa daily  . Diabetes mellitus    type 2   . Diabetic retinopathy (HCC)    NPDR OU  . Fibromyalgia   . GERD (gastroesophageal reflux disease)    doesn't take  any meds  . H/O hiatal hernia   . Hyperlipidemia   . Hypertension    takes Lisinopril and Diovan daily  . Hypertensive retinopathy    OU  . Hyperthyroidism    yrs ago but doesn't require meds for this  . Immune deficiency disorder (HCC)    pt states she has no clue what this is  . Insomnia   . Joint pain   . Joint swelling   . PTSD (post-traumatic stress disorder)   . PTSD (post-traumatic stress disorder)   . PVD (peripheral vascular disease) (HCC)   .  Sleep apnea     PAST SURGICAL HISTORY: Past Surgical History:  Procedure Laterality Date  . ABDOMINAL AORTAGRAM N/A 08/10/2011   Procedure: ABDOMINAL Ronny FlurryAORTAGRAM;  Surgeon: Sherren Kernsharles E Fields, MD;  [facility];  Service: Cardiovascular;  Laterality: N/A;  . AMPUTATION     lft foot toes 1.2.3  . AMPUTATION  01/07/2012   Procedure: AMPUTATION RAY;  Surgeon: Sherren Kernsharles E Fields, MD;  [facility];  Service: Vascular;  Laterality: Right;  4TH & 5TH toes  . aortogram    . CHOLECYSTECTOMY  1990  . COLONOSCOPY    . DILATION AND CURETTAGE OF UTERUS    . FEMORAL BYPASS  04/29/2008   left  . FEMORAL-POPLITEAL BYPASS GRAFT  08/27/2011   Procedure: BYPASS GRAFT FEMORAL-POPLITEAL ARTERY;  Surgeon: Sherren Kernsharles E Fields, MD;  [facility];  Service: Vascular;  Laterality: Right;  . FEMORAL-POPLITEAL BYPASS GRAFT  08/29/2011   Procedure: BYPASS GRAFT FEMORAL-POPLITEAL ARTERY;  Surgeon: Sherren Kernsharles E Fields, MD;  [facility];  Service: Vascular;  Laterality: Right;  Vein Patch Angionplasty and Thrombectomy   . GASTRIC ROUX-EN-Y N/A 09/02/2017   Procedure: LAPAROSCOPIC ROUX-EN-Y GASTRIC BYPASS WITH UPPER ENDOSCOPY AND ERAS PATHWAY;  Surgeon: Kinsinger, De BlanchLuke Aaron, MD;  Location: [facility] ORS;  Service: General;  Laterality: N/A;  . INTRAOPERATIVE ARTERIOGRAM  08/29/2011   Procedure: INTRA OPERATIVE ARTERIOGRAM;  Surgeon: Sherren Kernsharles E Fields, MD;  [facility];  Service: Vascular;  Laterality: Right;  . LOWER EXTREMITY ANGIOGRAM N/A 08/10/2011    Procedure: LOWER EXTREMITY ANGIOGRAM;  Surgeon: Sherren Kernsharles E Fields, MD;  [facility];  Service: Cardiovascular;  Laterality: N/A;  . THYROIDECTOMY N/A 05/29/2019   Procedure: TOTAL THYROIDECTOMY;  Surgeon: Darnell LevelGerkin, Todd, MD;  Location: [facility] ORS;  Service: General;  Laterality: N/A;  . TOE AMPUTATION  07/12/2008    left foot toes 1,2,3  . TUBAL LIGATION    . VASCULAR SURGERY      FAMILY HISTORY: Family History  Problem Relation Age of Onset  . Diabetes Mother   . Hyperlipidemia Mother   . Hypertension Mother   . Diabetes Father   . Heart disease Father        before age 55  . Hypertension Father   . Hyperlipidemia Father   . Hypertension Sister   . Diabetes Brother   . Glaucoma Paternal Grandfather     SOCIAL HISTORY: Social History   Socioeconomic History  . Marital status: Divorced    Spouse name: Not on file  . Number of children: Not on file  . Years of education: Not on file  . Highest education level: Not on file  Occupational History  . Not on file  Tobacco Use  . Smoking status: Former Smoker    Packs/day: 1.00    Years: 10.00    Pack years: 10.00    Types: Cigarettes    Quit date: 07/25/2008    Years since quitting: 11.4  . Smokeless tobacco: Never Used  . Tobacco comment: quit 3749yrs ago  Vaping Use  . Vaping Use: Never used  Substance and Sexual Activity  . Alcohol use: No  . Drug use: No  . Sexual activity: Not Currently  Other Topics Concern  . Not on file  Social History Narrative  . Not on file   Social Determinants of Health   Financial Resource Strain:   . Difficulty of Paying Living Expenses: Not on file  Food Insecurity:   . Worried About Programme researcher, broadcasting/film/videounning Out of Food in the  Last Year: Not on file  . Ran Out of Food in the Last Year: Not on file  Transportation Needs:   . Lack of Transportation (Medical): Not on file  . Lack of Transportation (Non-Medical): Not on file  Physical Activity:   . Days of Exercise per Week: Not on file  .  Minutes of Exercise per Session: Not on file  Stress:   . Feeling of Stress : Not on file  Social Connections:   . Frequency of Communication with Friends and Family: Not on file  . Frequency of Social Gatherings with Friends and Family: Not on file  . Attends Religious Services: Not on file  . Active Member of Clubs or Organizations: Not on file  . Attends Banker Meetings: Not on file  . Marital Status: Not on file  Intimate Partner Violence:   . Fear of Current or Ex-Partner: Not on file  . Emotionally Abused: Not on file  . Physically Abused: Not on file  . Sexually Abused: Not on file     PHYSICAL EXAM  Vitals:   12/24/19 1312  BP: (!) 156/96  Pulse: 82  Weight: 201 lb (91.2 kg)  Height: 5\' 7"  (1.702 m)   Body mass index is 31.48 kg/m.  Generalized: Well developed, in no acute distress  Cardiology: normal rate and rhythm, no murmur noted Respiratory: clear to auscultation bilaterally  Neurological examination  Mentation: Alert oriented to time, place, history taking. Follows all commands speech and language fluent Cranial nerve II-XII: Pupils were equal round reactive to light. Extraocular movements were full, visual field were full  Motor: The motor testing reveals 5 over 5 strength of all 4 extremities. Good symmetric motor tone is noted throughout.  Gait and station: Gait is normal.    DIAGNOSTIC DATA (LABS, IMAGING, TESTING) - I reviewed patient records, labs, notes, testing and imaging myself where available.  No flowsheet data found.   Lab Results  Component Value Date   WBC 5.9 05/25/2019   HGB 12.5 05/25/2019   HCT 39.7 05/25/2019   MCV 95.0 05/25/2019   PLT 249 05/25/2019      Component Value Date/Time   NA 137 05/30/2019 0530   K 5.4 (H) 05/30/2019 0530   CL 101 05/30/2019 0530   CO2 26 05/30/2019 0530   GLUCOSE 232 (H) 05/30/2019 0530   BUN 28 (H) 05/30/2019 0530   CREATININE 1.12 (H) 05/30/2019 0530   CALCIUM 8.4 (L)  05/30/2019 0530   PROT 7.2 10/16/2017 1135   ALBUMIN 3.3 (L) 10/16/2017 1135   AST 19 10/16/2017 1135   ALT 22 10/16/2017 1135   ALKPHOS 78 10/16/2017 1135   BILITOT 1.1 10/16/2017 1135   GFRNONAA 56 (L) 05/30/2019 0530   GFRAA >60 05/30/2019 0530   Lab Results  Component Value Date   CHOL 197 06/16/2014   HDL 30 (L) 06/16/2014   LDLCALC 125 (H) 06/16/2014   TRIG 211 (H) 06/16/2014   CHOLHDL 6.6 06/16/2014   Lab Results  Component Value Date   HGBA1C 9.5 (H) 05/25/2019   No results found for: VITAMINB12 Lab Results  Component Value Date   TSH 0.605 02/23/2008     ASSESSMENT AND PLAN 54 y.o. year old female  has a past medical history of Anemia, Anxiety, Anxiety disorder, Cataract, Complication of anesthesia, Depression, major, Diabetes mellitus, Diabetic retinopathy (HCC), Fibromyalgia, GERD (gastroesophageal reflux disease), H/O hiatal hernia, Hyperlipidemia, Hypertension, Hypertensive retinopathy, Hyperthyroidism, Immune deficiency disorder (HCC), Insomnia, Joint pain, Joint swelling, PTSD (  post-traumatic stress disorder), PTSD (post-traumatic stress disorder), PVD (peripheral vascular disease) (HCC), and Sleep apnea. here with     ICD-10-CM   1. OSA on CPAP  G47.33    Z99.89      DARSI TIEN has not recently used her CPAP machine.  She is motivated to resume therapy.  She does Barber benefit of use.  She was encouraged to continue using CPAP nightly and for greater than 4 hours each night. We will update supply orders as indicated. Risks of untreated sleep apnea review and education materials provided. Healthy lifestyle habits encouraged. She will follow up in 3 months, sooner if needed. She verbalizes understanding and agreement with this plan.    No orders of the defined types were placed in this encounter.    No orders of the defined types were placed in this encounter.     I spent 15 minutes with the patient. 50% of this time was spent counseling and educating  patient on plan of care and medications.    Shawnie Dapper, FNP-C 12/24/2019, 3:37 PM [facility] [address], [address] [place], [place] [address] [phone]  I reviewed the above Barber and documentation by the Nurse Practitioner and agree with the history, exam, assessment and plan as outlined above. I was available for consultation. Huston Foley, MD, PhD [facility])

## 2020-02-08 ENCOUNTER — Telehealth: Payer: Self-pay | Admitting: Family Medicine

## 2020-02-08 DIAGNOSIS — G4733 Obstructive sleep apnea (adult) (pediatric): Secondary | ICD-10-CM

## 2020-02-08 DIAGNOSIS — Z9989 Dependence on other enabling machines and devices: Secondary | ICD-10-CM

## 2020-02-08 NOTE — Telephone Encounter (Signed)
We please contact patient and inquire about any possible limitations to using CPAP.  I reviewed her most recent compliance report which shows that she has not used it since last being seen.  At last visit she expressed an interest in resuming CPAP therapy.  I will be happy to help with any concerns if we can.  Remind her that we recommend daily usage for a minimum of 4 hours each day. 

## 2020-02-15 NOTE — Telephone Encounter (Signed)
GAILENE YOUKHANA is a 54 y.o. female was contacted re: message below.  Pt said she is not using her cpap because it doesn't work. She said she requested a new sleep study but Amy told her no she wants her to use the machine for 2-3 months. Pt said nobody will sleep in the house with her anymore because she snores no loud. Pt also said she has lost about 75-100lbs.

## 2020-02-15 NOTE — Telephone Encounter (Signed)
Last sleep study showed mild OSA but due to health history, CPAP advised by Dr Frances Furbish. She was last seen by [facility] 02/2019 when AHI was elevated and showed need for CPAP. I saw her 12/2019 and only had 4% usage to review. She verbalized to me that she wished to resume therapy as she recognized health benefits. I do not have anything documented where she reported CPAP did not work. Has she reached out to her DME? If not, she should. I am not opposed to repeating sleep study. Insurance will not cover as last study was 1 year ago.

## 2020-02-16 NOTE — Progress Notes (Shared)
[facility] - Clinic Note  02/17/2020     CHIEF COMPLAINT Patient presents for No chief complaint on file.   HISTORY OF PRESENT ILLNESS: Kelly Barber is a 54 y.o. female who presents to the clinic today for:   pt states her blood pressure meds have been increased and her blood pressure is doing much better  Referring physician: Zachery Dauer, FNP [address] [place],  [place] [address]  HISTORICAL INFORMATION:   Selected notes from the MEDICAL RECORD NUMBER Referred by Dr. Laruth Bouchard for DME   CURRENT MEDICATIONS: No current outpatient medications on file. (Ophthalmic Drugs)   No current facility-administered medications for this visit. (Ophthalmic Drugs)   Current Outpatient Medications (Other)  Medication Sig  . acetaminophen (TYLENOL) 325 MG tablet Take 2 tablets (650 mg total) by mouth every 6 (six) hours as needed for mild pain (or Fever >/= 101).  Marland Kitchen ALPRAZolam (XANAX) 0.5 MG tablet Take 0.5 mg by mouth 3 (three) times daily as needed.  . calcium carbonate (OS-CAL - DOSED IN MG OF ELEMENTAL CALCIUM) 1250 (500 Ca) MG tablet Take 2 tablets (1,000 mg of elemental calcium total) by mouth 3 (three) times daily with meals.  . clonazePAM (KLONOPIN) 1 MG tablet Take 1 mg by mouth 3 (three) times daily.  . clopidogrel (PLAVIX) 75 MG tablet Take 75 mg by mouth every evening.  . DULoxetine (CYMBALTA) 60 MG capsule Take 120 mg by mouth at bedtime.  Marland Kitchen levothyroxine (SYNTHROID) 88 MCG tablet Take 88 mcg by mouth every morning.  Marland Kitchen losartan (COZAAR) 25 MG tablet Take 25 mg by mouth daily.  Marland Kitchen OZEMPIC, 0.25 OR 0.5 MG/DOSE, 2 MG/1.5ML SOPN Inject 0.25 mg into the skin every Wednesday.   . pregabalin (LYRICA) 75 MG capsule Take 75 mg by mouth every evening.  . traMADol (ULTRAM) 50 MG tablet Take 1 tablet (50 mg total) by mouth every 6 (six) hours as needed (mild pain).   No current facility-administered medications for this visit. (Other)      REVIEW OF  SYSTEMS:    ALLERGIES Allergies  Allergen Reactions  . Amoxicillin-Pot Clavulanate Shortness Of Breath    Has patient had a PCN reaction causing immediate rash, facial/tongue/throat swelling, SOB or lightheadedness with hypotension: Yes Has patient had a PCN reaction causing severe rash involving mucus membranes or skin necrosis: No Has patient had a PCN reaction that required hospitalization: Yes Has patient had a PCN reaction occurring within the last 10 years: No If all of the above answers are "NO", then may proceed with Cephalosporin use.   . Codeine Anaphylaxis  . Darvocet [Propoxyphene N-Acetaminophen] Anaphylaxis    Plain Tylenol ok  . Oxycodone Anaphylaxis  . Statins Other (See Comments)    Joint pain  . Propoxyphene     PAST MEDICAL HISTORY Past Medical History:  Diagnosis Date  . Anemia   . Anxiety    takes xanax daily  . Anxiety disorder   . Cataract    Mixed form OU  . Complication of anesthesia   . Depression, major    takes wellbutrin and celexa daily  . Diabetes mellitus    type 2   . Diabetic retinopathy (HCC)    NPDR OU  . Fibromyalgia   . GERD (gastroesophageal reflux disease)    doesn't take any meds  . H/O hiatal hernia   . Hyperlipidemia   . Hypertension    takes Lisinopril and Diovan daily  . Hypertensive retinopathy  OU  . Hyperthyroidism    yrs ago but doesn't require meds for this  . Immune deficiency disorder (HCC)    pt states she has no clue what this is  . Insomnia   . Joint pain   . Joint swelling   . PTSD (post-traumatic stress disorder)   . PTSD (post-traumatic stress disorder)   . PVD (peripheral vascular disease) (HCC)   . Sleep apnea    Past Surgical History:  Procedure Laterality Date  . ABDOMINAL AORTAGRAM N/A 08/10/2011   Procedure: ABDOMINAL Ronny Flurry;  Surgeon: Sherren Kerns, MD;  Location: [facility] CATH LAB;  Service: Cardiovascular;  Laterality: N/A;  . AMPUTATION     lft foot toes 1.2.3  . AMPUTATION  01/07/2012    Procedure: AMPUTATION RAY;  Surgeon: Sherren Kerns, MD;  Location: [facility] OR;  Service: Vascular;  Laterality: Right;  4TH & 5TH toes  . aortogram    . CHOLECYSTECTOMY  1990  . COLONOSCOPY    . DILATION AND CURETTAGE OF UTERUS    . FEMORAL BYPASS  04/29/2008   left  . FEMORAL-POPLITEAL BYPASS GRAFT  08/27/2011   Procedure: BYPASS GRAFT FEMORAL-POPLITEAL ARTERY;  Surgeon: Sherren Kerns, MD;  Location: [facility] OR;  Service: Vascular;  Laterality: Right;  . FEMORAL-POPLITEAL BYPASS GRAFT  08/29/2011   Procedure: BYPASS GRAFT FEMORAL-POPLITEAL ARTERY;  Surgeon: Sherren Kerns, MD;  Location: [facility] OR;  Service: Vascular;  Laterality: Right;  Vein Patch Angionplasty and Thrombectomy   . GASTRIC ROUX-EN-Y N/A 09/02/2017   Procedure: LAPAROSCOPIC ROUX-EN-Y GASTRIC BYPASS WITH UPPER ENDOSCOPY AND ERAS PATHWAY;  Surgeon: Kinsinger, De Blanch, MD;  Location: [facility] ORS;  Service: General;  Laterality: N/A;  . INTRAOPERATIVE ARTERIOGRAM  08/29/2011   Procedure: INTRA OPERATIVE ARTERIOGRAM;  Surgeon: Sherren Kerns, MD;  Location: [facility] OR;  Service: Vascular;  Laterality: Right;  . LOWER EXTREMITY ANGIOGRAM N/A 08/10/2011   Procedure: LOWER EXTREMITY ANGIOGRAM;  Surgeon: Sherren Kerns, MD;  Location: [facility] CATH LAB;  Service: Cardiovascular;  Laterality: N/A;  . THYROIDECTOMY N/A 05/29/2019   Procedure: TOTAL THYROIDECTOMY;  Surgeon: Darnell Level, MD;  Location: [facility] ORS;  Service: General;  Laterality: N/A;  . TOE AMPUTATION  07/12/2008    left foot toes 1,2,3  . TUBAL LIGATION    . VASCULAR SURGERY      FAMILY HISTORY Family History  Problem Relation Age of Onset  . Diabetes Mother   . Hyperlipidemia Mother   . Hypertension Mother   . Diabetes Father   . Heart disease Father        before age 83  . Hypertension Father   . Hyperlipidemia Father   . Hypertension Sister   . Diabetes Brother   . Glaucoma Paternal Grandfather     SOCIAL HISTORY Social History   Tobacco Use  . Smoking status: Former  Smoker    Packs/day: 1.00    Years: 10.00    Pack years: 10.00    Types: Cigarettes    Quit date: 07/25/2008    Years since quitting: 11.5  . Smokeless tobacco: Never Used  . Tobacco comment: quit 25yrs ago  Vaping Use  . Vaping Use: Never used  Substance Use Topics  . Alcohol use: No  . Drug use: No         OPHTHALMIC EXAM:  Not recorded     IMAGING AND PROCEDURES  Imaging and Procedures for @TODAY @           ASSESSMENT/PLAN:  No diagnosis found.  1,2. Moderate Non-proliferative diabetic retinopathy, OU  - s/p IVA OS #1 (04.07.21), #2 (05.05.21), #3 (06.02.21), #4 (07.08.21)  - FA (05.05.21) w/ late leaking MA OU, no NV OU  - pt reports BGs uncontrolled  - exam shows scattered MA and exudate OU  - OCT shows diabetic macular edema, both eyes (OS > OD)   - BCVA 20/25 OU - improved  - OCT shows mild interval improvement in IRF OU  - recommend IVA #4 OS today, 07.08.21  - pt wishes to proceed with injection  - informed consent obtained  - IVA consent form signed and scanned, 04.07.21  - see procedure note  - f/u in 4-5 wks, DFE, OCT, possible injection  3,4. Hypertensive retinopathy OU  - discussed importance of tight BP control  - monitor  5. Mixed form age related cataract OU  - The symptoms of cataract, surgical options, and treatments and risks were discussed with patient.  - discussed diagnosis and progression  - not yet visually significant  - monitor for now  6. Glaucoma Suspect  - +cupping  - IOP today: 16,19  - managed by Dr. Laruth Bouchard  - patient is not on any drops    Ophthalmic Meds Ordered this visit:  No orders of the defined types were placed in this encounter.      No follow-ups on file.  There are no Patient Instructions on file for this visit.   Explained the diagnoses, plan, and follow up with the patient and they expressed understanding.  Patient expressed understanding of the importance of proper follow up care.     This document serves as a record of services personally performed by Karie Chimera, MD, PhD. It was created on their behalf by Annalee Genta, COMT. The creation of this record is the provider's dictation and/or activities during the visit.  Electronically signed by: Annalee Genta, COMT 02/16/20 10:49 AM     Karie Chimera, M.D., Ph.D. Diseases & Surgery of the Retina and Vitreous [facility]    Abbreviations: M myopia (nearsighted); A astigmatism; H hyperopia (farsighted); P presbyopia; Mrx spectacle prescription;  CTL contact lenses; OD right eye; OS left eye; OU both eyes  XT exotropia; ET esotropia; PEK punctate epithelial keratitis; PEE punctate epithelial erosions; DES dry eye syndrome; MGD meibomian gland dysfunction; ATs artificial tears; PFAT's preservative free artificial tears; NSC nuclear sclerotic cataract; PSC posterior subcapsular cataract; ERM epi-retinal membrane; PVD posterior vitreous detachment; RD retinal detachment; DM diabetes mellitus; DR diabetic retinopathy; NPDR non-proliferative diabetic retinopathy; PDR proliferative diabetic retinopathy; CSME clinically significant macular edema; DME diabetic macular edema; dbh dot blot hemorrhages; CWS cotton wool spot; POAG primary open angle glaucoma; C/D cup-to-disc ratio; HVF humphrey visual field; GVF goldmann visual field; OCT optical coherence tomography; IOP intraocular pressure; BRVO Branch retinal vein occlusion; CRVO central retinal vein occlusion; CRAO central retinal artery occlusion; BRAO branch retinal artery occlusion; RT retinal tear; SB scleral buckle; PPV pars plana vitrectomy; VH Vitreous hemorrhage; PRP panretinal laser photocoagulation; IVK intravitreal kenalog; VMT vitreomacular traction; MH Macular hole;  NVD neovascularization of the disc; NVE neovascularization elsewhere; AREDS age related eye disease study; ARMD age related macular degeneration; POAG primary open angle glaucoma; EBMD  epithelial/anterior basement membrane dystrophy; ACIOL anterior chamber intraocular lens; IOL intraocular lens; PCIOL posterior chamber intraocular lens; Phaco/IOL phacoemulsification with intraocular lens placement; PRK photorefractive keratectomy; LASIK laser assisted in situ keratomileusis; HTN hypertension; DM diabetes mellitus; COPD chronic obstructive pulmonary disease

## 2020-02-17 ENCOUNTER — Encounter (INDEPENDENT_AMBULATORY_CARE_PROVIDER_SITE_OTHER): Payer: 59 | Admitting: Ophthalmology

## 2020-02-17 NOTE — Telephone Encounter (Signed)
Pt called stating that [facility] is needing a written order sent to them so that they are able to increase the pressure to her cpap. Please advise.

## 2020-02-17 NOTE — Telephone Encounter (Signed)
Spoke to the patient and she said she suggested to pay out of pocket for her repeat sleep study. I explained to the patient she should contact [facility] re her weight fluctuating and needing a new titration.  Pt said she will f/u with [facility] tomorrow.  I have rescheduled her f/u appt for 05/16/20 @7 :30 with Amy Lomax

## 2020-02-17 NOTE — Addendum Note (Signed)
Addended by: Guy Begin on: 02/17/2020 05:33 PM   Modules accepted: Orders

## 2020-02-18 NOTE — Telephone Encounter (Signed)
I have placed an order for reeducation. She should have supplies and I do not know if she needs a mask refitting. I would like for her to restart therapy and then we can address any issues. TY.

## 2020-02-18 NOTE — Telephone Encounter (Signed)
Please ignore previous note...that was original message.   Please let her know that based on previous information reviewed from compliance report, I do not have enough data to determine need for increased pressure. I am not sure what is meant by "titration". My recommendation is that she use CPAP nightly and greater than 4 hours. We can follow up in 2-3 months to reevaluate. I can not answer questions about paying out of pocket for HST.

## 2020-02-18 NOTE — Telephone Encounter (Signed)
I called pt and she stated she saw [facility], they are needing new order since last order was 12-2018.  This was the initial cpap order.  If we can go head and do this?  Would this require a new study because of not using.  That may be issue. I called and spoke to [facility] CS (stephanie I believe).  Pt needs call and make appt with [facility] to take her machine, mask and see how things are working.  She has not been able to use it and I relayed that we cannot adjust things if she is not using.  I believe she may need to have another order placed. Let me know and I will do.

## 2020-02-18 NOTE — Addendum Note (Signed)
Addended by: Shawnie Dapper L on: 02/18/2020 02:16 PM   Modules accepted: Orders

## 2020-02-18 NOTE — Telephone Encounter (Signed)
I placed cpap download on your desk to view. Titration?

## 2020-02-18 NOTE — Telephone Encounter (Signed)
We please contact patient and inquire about any possible limitations to using CPAP.  I reviewed her most recent compliance report which shows that she has not used it since last being seen.  At last visit she expressed an interest in resuming CPAP therapy.  I will be happy to help with any concerns if we can.  Remind her that we recommend daily usage for a minimum of 4 hours each day.

## 2020-02-18 NOTE — Addendum Note (Signed)
Addended byNicholas Lose, Ozzy Bohlken L on: 02/18/2020 11:30 AM   Modules accepted: Orders

## 2020-02-22 NOTE — Telephone Encounter (Signed)
Brown, Jessica  Lannah Koike S, RN got it, thanks  

## 2020-03-01 NOTE — Telephone Encounter (Signed)
community message sent for CPAP orders

## 2020-03-05 ENCOUNTER — Ambulatory Visit (HOSPITAL_COMMUNITY)
Admission: EM | Admit: 2020-03-05 | Discharge: 2020-03-05 | Disposition: A | Discharge status: Home / Self Care | Payer: 59 | Attending: Student | Admitting: Student

## 2020-03-05 ENCOUNTER — Encounter (HOSPITAL_COMMUNITY): Payer: Self-pay

## 2020-03-05 ENCOUNTER — Other Ambulatory Visit: Payer: Self-pay

## 2020-03-05 DIAGNOSIS — B9689 Other specified bacterial agents as the cause of diseases classified elsewhere: Secondary | ICD-10-CM | POA: Diagnosis not present

## 2020-03-05 DIAGNOSIS — N760 Acute vaginitis: Secondary | ICD-10-CM | POA: Insufficient documentation

## 2020-03-05 LAB — POCT URINALYSIS DIPSTICK, ED / UC
Bilirubin Urine: NEGATIVE
Glucose, UA: NEGATIVE mg/dL
Hgb urine dipstick: NEGATIVE
Ketones, ur: NEGATIVE mg/dL
Leukocytes,Ua: NEGATIVE
Nitrite: POSITIVE — AB
Protein, ur: NEGATIVE mg/dL
Specific Gravity, Urine: >=1.03 (ref 1.005–1.030)
Urobilinogen, UA: 0.2 mg/dL (ref 0.0–1.0)
pH: 5.5 (ref 5.0–8.0)

## 2020-03-05 MED ORDER — POLYETHYLENE GLYCOL 3350 17 GM/SCOOP PO POWD: 17.0000 g | Freq: Once | ORAL | 0 refills | Status: AC

## 2020-03-05 MED ORDER — FLUCONAZOLE 150 MG PO TABS: 150.0000 mg | ORAL_TABLET | Freq: Every day | ORAL | 0 refills | Status: DC

## 2020-03-05 MED ORDER — METRONIDAZOLE 500 MG PO TABS: 500.0000 mg | ORAL_TABLET | Freq: Two times a day (BID) | ORAL | 0 refills | Status: DC

## 2020-03-05 MED ORDER — FLUCONAZOLE 150 MG PO TABS: 150.0000 mg | ORAL_TABLET | Freq: Every day | ORAL | 0 refills | Status: AC

## 2020-03-05 MED ORDER — DOCUSATE SODIUM 100 MG PO CAPS: 100.0000 mg | ORAL_CAPSULE | Freq: Two times a day (BID) | ORAL | 0 refills | Status: DC

## 2020-03-05 NOTE — ED Provider Notes (Signed)
[facility]    CSN: [id number] Arrival date & time: 03/05/20  1727      History   Chief Complaint Chief Complaint  Patient presents with  . Abdominal Pain  . Dysuria    HPI Kelly Barber is a 55 y.o. female Presenting for vaginal symptoms for 2 days. History of anemia, anxiety, depression, diabetes 2, fibromyalgia, GERD, hypertension. Presenting today with abd pain and dysuria x2 days. -Vaginal symptoms: States her [place] causes frequent BV and yeast infections. For the last 2 days she's had itching externally and some clear discharge. It feels like the onset of her typical BV. Also states she gets yeast infections whenever treated for BV. Denies hematuria, frequency, urgency, back pain, n/v/d, fevers/chills, new partners/STI risk. Could not be pregnant as she's postmenopausal and her tubes were tied 10 years ago. -Also states she has a history of frequent constipation since she had bariatric surgery 2 years ago. For the last 3 days she's had intermittent crampy lower abd pian, and she has not had a bowel movement. Passing minimal gas. She states her fiber consumption has decreased lately due to holidays, and attributes constipation to this. Hasn't tried anything for her symptoms.    HPI  Past Medical History:  Diagnosis Date  . Anemia   . Anxiety    takes xanax daily  . Anxiety disorder   . Cataract    Mixed form OU  . Complication of anesthesia   . Depression, major    takes wellbutrin and celexa daily  . Diabetes mellitus    type 2   . Diabetic retinopathy (HCC)    NPDR OU  . Fibromyalgia   . GERD (gastroesophageal reflux disease)    doesn't take any meds  . H/O hiatal hernia   . Hyperlipidemia   . Hypertension    takes Lisinopril and Diovan daily  . Hypertensive retinopathy    OU  . Hyperthyroidism    yrs ago but doesn't require meds for this  . Immune deficiency disorder (HCC)    pt states she has no clue what this is  . Insomnia   . Joint pain    . Joint swelling   . PTSD (post-traumatic stress disorder)   . PTSD (post-traumatic stress disorder)   . PVD (peripheral vascular disease) (HCC)   . Sleep apnea     Patient Active Problem List   Diagnosis Date Noted  . Substernal thyroid goiter 05/26/2019  . Subclinical hyperthyroidism 05/26/2019  . Morbid obesity (HCC) 09/02/2017  . Glaucoma 06/15/2014  . S/P amputation of foot (HCC) 06/14/2014  . Depression 06/14/2014  . Diabetic neuropathy (HCC) 06/14/2014  . Aftercare following surgery of the circulatory system, NEC 12/20/2011  . Atherosclerosis of native arteries of the extremities with ulceration(440.23) 12/20/2011  . Preop cardiovascular exam 08/16/2011  . Atherosclerosis of native arteries of the extremities with intermittent claudication 07/26/2011  . CELLULITIS AND ABSCESS OF TRUNK 08/09/2008  . LOWER LIMB AMPUTATION, OTHER TOE 08/03/2008  . PERIPHERAL VASCULAR DISEASE 04/29/2008  . LEUKOCYTOCLASTIC VASCULITIS 04/28/2008  . VASCULAR PURPURA 04/21/2008  . GERD 02/23/2008  . GOITER, MULTINODULAR 12/19/2006  . Diabetes (HCC) 12/19/2006  . ANXIETY 12/19/2006  . DEPRESSION 12/19/2006  . Essential hypertension 12/19/2006  . GALLSTONES 12/19/2006  . FIBROMYALGIA 12/19/2006  . WEIGHT GAIN 12/19/2006  . CHEST PAIN, NON-CARDIAC 12/19/2006    Past Surgical History:  Procedure Laterality Date  . ABDOMINAL AORTAGRAM N/A 08/10/2011   Procedure: ABDOMINAL Ronny Flurry;  Surgeon: Sherren Kerns,  MD;  Location: [facility];  Service: Cardiovascular;  Laterality: N/A;  . AMPUTATION     lft foot toes 1.2.3  . AMPUTATION  01/07/2012   Procedure: AMPUTATION RAY;  Surgeon: Elam Dutch, MD;  Location: [facility] OR;  Service: Vascular;  Laterality: Right;  4TH & 5TH toes  . aortogram    . CHOLECYSTECTOMY  1990  . COLONOSCOPY    . DILATION AND CURETTAGE OF UTERUS    . FEMORAL BYPASS  04/29/2008   left  . FEMORAL-POPLITEAL BYPASS GRAFT  08/27/2011   Procedure: BYPASS GRAFT  FEMORAL-POPLITEAL ARTERY;  Surgeon: Elam Dutch, MD;  Location: [facility];  Service: Vascular;  Laterality: Right;  . FEMORAL-POPLITEAL BYPASS GRAFT  08/29/2011   Procedure: BYPASS GRAFT FEMORAL-POPLITEAL ARTERY;  Surgeon: Elam Dutch, MD;  Location: [facility] OR;  Service: Vascular;  Laterality: Right;  Vein Patch Angionplasty and Thrombectomy   . GASTRIC ROUX-EN-Y N/A 09/02/2017   Procedure: LAPAROSCOPIC ROUX-EN-Y GASTRIC BYPASS WITH UPPER ENDOSCOPY AND ERAS PATHWAY;  Surgeon: Kinsinger, Arta Bruce, MD;  Location: [facility] ORS;  Service: General;  Laterality: N/A;  . INTRAOPERATIVE ARTERIOGRAM  08/29/2011   Procedure: INTRA OPERATIVE ARTERIOGRAM;  Surgeon: Elam Dutch, MD;  Location: [facility];  Service: Vascular;  Laterality: Right;  . LOWER EXTREMITY ANGIOGRAM N/A 08/10/2011   Procedure: LOWER EXTREMITY ANGIOGRAM;  Surgeon: Elam Dutch, MD;  Location: [facility] CATH LAB;  Service: Cardiovascular;  Laterality: N/A;  . THYROIDECTOMY N/A 05/29/2019   Procedure: TOTAL THYROIDECTOMY;  Surgeon: Armandina Gemma, MD;  Location: [facility] ORS;  Service: General;  Laterality: N/A;  . TOE AMPUTATION  07/12/2008    left foot toes 1,2,3  . TUBAL LIGATION    . VASCULAR SURGERY      OB History   No obstetric history on file.      Home Medications    Prior to Admission medications   Medication Sig Start Date End Date Taking? Authorizing Provider  docusate sodium (COLACE) 100 MG capsule Take 1 capsule (100 mg total) by mouth every 12 (twelve) hours. 03/05/20  Yes Hazel Sams, PA-C  metroNIDAZOLE (FLAGYL) 500 MG tablet Take 1 tablet (500 mg total) by mouth 2 (two) times daily. 03/05/20  Yes Hazel Sams, PA-C  acetaminophen (TYLENOL) 325 MG tablet Take 2 tablets (650 mg total) by mouth every 6 (six) hours as needed for mild pain (or Fever >/= 101). 05/30/19   Clovis Riley, MD  ALPRAZolam Duanne Moron) 0.5 MG tablet Take 0.5 mg by mouth 3 (three) times daily as needed. 07/07/19   [provider]  calcium carbonate  (OS-CAL - DOSED IN MG OF ELEMENTAL CALCIUM) 1250 (500 Ca) MG tablet Take 2 tablets (1,000 mg of elemental calcium total) by mouth 3 (three) times daily with meals. 05/30/19   Clovis Riley, MD  clonazePAM (KLONOPIN) 1 MG tablet Take 1 mg by mouth 3 (three) times daily. 06/23/19   [provider]  clopidogrel (PLAVIX) 75 MG tablet Take 75 mg by mouth every evening.    [provider]  DULoxetine (CYMBALTA) 60 MG capsule Take 120 mg by mouth at bedtime.    [provider]  fluconazole (DIFLUCAN) 150 MG tablet Take 1 tablet (150 mg total) by mouth daily for 2 doses. Take 1 pill of diflucan on day 1 of flagyl. Take second pill of diflucan if you develop yeast symptoms. 03/05/20 03/07/20  Hazel Sams, PA-C  levothyroxine (SYNTHROID) 88 MCG tablet Take 88 mcg by mouth every  morning. 06/29/19   [provider]  losartan (COZAAR) 25 MG tablet Take 25 mg by mouth daily. 07/07/19   [provider]  OZEMPIC, 0.25 OR 0.5 MG/DOSE, 2 MG/1.5ML SOPN Inject 0.25 mg into the skin every Wednesday.  03/13/19   [provider]  traMADol (ULTRAM) 50 MG tablet Take 1 tablet (50 mg total) by mouth every 6 (six) hours as needed (mild pain). 05/30/19   Berna Bue, MD  pregabalin (LYRICA) 75 MG capsule Take 75 mg by mouth every evening.  03/05/20  [provider]    Family History Family History  Problem Relation Age of Onset  . Diabetes Mother   . Hyperlipidemia Mother   . Hypertension Mother   . Diabetes Father   . Heart disease Father        before age 34  . Hypertension Father   . Hyperlipidemia Father   . Hypertension Sister   . Diabetes Brother   . Glaucoma Paternal Grandfather     Social History Social History   Tobacco Use  . Smoking status: Former Smoker    Packs/day: 1.00    Years: 10.00    Pack years: 10.00    Types: Cigarettes    Quit date: 07/25/2008    Years since quitting: 11.6  . Smokeless tobacco: Never Used  . Tobacco  comment: quit 45yrs ago  Vaping Use  . Vaping Use: Never used  Substance Use Topics  . Alcohol use: No  . Drug use: No     Allergies   Amoxicillin-pot clavulanate, Codeine, Darvocet [propoxyphene n-acetaminophen], Oxycodone, Statins, and Propoxyphene   Review of Systems Review of Systems  Gastrointestinal: Positive for abdominal pain and constipation. Negative for diarrhea, nausea and vomiting.  Genitourinary: Positive for vaginal discharge. Negative for decreased urine volume, difficulty urinating, dysuria, flank pain, frequency, genital sores, hematuria, menstrual problem, pelvic pain, urgency, vaginal bleeding and vaginal pain.  All other systems reviewed and are negative.    Physical Exam Triage Vital Signs ED Triage Vitals  Enc Vitals Group     BP 03/05/20 1855 (!) 153/60     Pulse Rate 03/05/20 1855 80     Resp 03/05/20 1855 19     Temp 03/05/20 1855 98.1 F (36.7 C)     Temp Source 03/05/20 1855 Oral     SpO2 03/05/20 1855 98 %     Weight --      Height --      Head Circumference --      Peak Flow --      Pain Score 03/05/20 1854 5     Pain Loc --      Pain Edu? --      Excl. in GC? --    No data found.  Updated Vital Signs BP (!) 153/60 (BP Location: Left Arm)   Pulse 80   Temp 98.1 F (36.7 C) (Oral)   Resp 19   LMP 03/05/2018   SpO2 98%   Visual Acuity Right Eye Distance:   Left Eye Distance:   Bilateral Distance:    Right Eye Near:   Left Eye Near:    Bilateral Near:     Physical Exam Vitals reviewed.  Constitutional:      General: She is not in acute distress.    Appearance: Normal appearance. She is well-developed. She is not ill-appearing.  HENT:     Head: Normocephalic and atraumatic.  Cardiovascular:     Rate and Rhythm: Normal rate and regular  rhythm.     Heart sounds: Normal heart sounds.  Pulmonary:     Effort: Pulmonary effort is normal.     Breath sounds: Normal breath sounds.  Abdominal:     General: Bowel sounds are  normal. There is no distension.     Palpations: Abdomen is soft. There is no mass.     Tenderness: There is no abdominal tenderness. There is no right CVA tenderness, left CVA tenderness, guarding or rebound. Negative signs include Murphy's sign, Rovsing's sign and McBurney's sign.  Neurological:     General: No focal deficit present.     Mental Status: She is alert and oriented to person, place, and time. Mental status is at baseline.  Psychiatric:        Mood and Affect: Mood normal.        Behavior: Behavior normal.        Thought Content: Thought content normal.        Judgment: Judgment normal.      UC Treatments / Results  Labs (all labs ordered are listed, but only abnormal results are displayed) Labs Reviewed  POCT URINALYSIS DIPSTICK, ED / UC - Abnormal; Notable for the following components:      Result Value   Nitrite POSITIVE (*)    All other components within normal limits  URINE CULTURE  CERVICOVAGINAL ANCILLARY ONLY    EKG   Radiology No results found.  Procedures Procedures (including critical care time)  Medications Ordered in UC Medications - No data to display  Initial Impression / Assessment and Plan / UC Course  I have reviewed the triage vital signs and the nursing notes.  Pertinent labs & imaging results that were available during my care of the patient were reviewed by me and considered in my medical decision making (see chart for details).     UA today with positive nitrite, otherwise negative. Culture sent. Given long history of BV, plan to treat for BV as below. Will send for G/C, trich, yeast,BV testing. Pt is not pregnant and denies sti risk.  For constipation, miralax and colace as below. Good hydration, fibrous diet. Strict return precautions- if she does not have bowel movement in 1-2 days, if she develops severe/worsening abd pain, if she stops passing gas- seek immediate medical attention.   -For constipation, take 1-2 capfuls of  miralax (17g) daily. Also try Colace stool softener, 1-2 pills daily.  -For BV, start the Flagyl. Take 2 pills daily for 7 days. Make sure not to drink alcohol while on this medication.  -To prevent yeast infection, take one pill of diflucan on day 1 of Flagyl. You can take the second pill if you develop symptoms.  -We'll call you if the result of any of your labwork is positive.  Final Clinical Impressions(s) / UC Diagnoses   Final diagnoses:  Bacterial vaginosis     Discharge Instructions     -For constipation, take 1-2 capfuls of miralax (17g) daily. Also try Colace stool softener, 1-2 pills daily.  -For BV, start the Flagyl. Take 2 pills daily for 7 days. Make sure not to drink alcohol while on this medication.  -To prevent yeast infection, take one pill of diflucan on day 1 of Flagyl. You can take the second pill if you develop symptoms.  -We'll call you if the result of any of your labwork is positive.     ED Prescriptions    Medication Sig Dispense Auth. Provider   metroNIDAZOLE (FLAGYL) 500 MG tablet  Take 1 tablet (500 mg total) by mouth 2 (two) times daily. 14 tablet Rhys Martini, PA-C   fluconazole (DIFLUCAN) 150 MG tablet  (Status: Discontinued) Take 1 tablet (150 mg total) by mouth daily for 2 doses. 2 tablet Rhys Martini, PA-C   polyethylene glycol powder (GLYCOLAX/MIRALAX) 17 GM/SCOOP powder Take 17 g by mouth once for 1 dose. 255 g Rhys Martini, PA-C   docusate sodium (COLACE) 100 MG capsule Take 1 capsule (100 mg total) by mouth every 12 (twelve) hours. 60 capsule Rhys Martini, PA-C   fluconazole (DIFLUCAN) 150 MG tablet  (Status: Discontinued) Take 1 tablet (150 mg total) by mouth daily for 2 doses. 2 tablet Rhys Martini, PA-C   fluconazole (DIFLUCAN) 150 MG tablet  (Status: Discontinued) Take 1 tablet (150 mg total) by mouth daily for 2 doses. Take 1 pill of diflucan on day 1 of flagyl. Take second pill of diflucan if you develop yeast symptoms. 2 tablet  Rhys Martini, PA-C   fluconazole (DIFLUCAN) 150 MG tablet Take 1 tablet (150 mg total) by mouth daily for 2 doses. Take 1 pill of diflucan on day 1 of flagyl. Take second pill of diflucan if you develop yeast symptoms. 2 tablet Rhys Martini, PA-C     PDMP not reviewed this encounter.   Rhys Martini, PA-C 03/06/20 [phone]

## 2020-03-05 NOTE — Discharge Instructions (Signed)
-  For constipation, take 1-2 capfuls of miralax (17g) daily. Also try Colace stool softener, 1-2 pills daily.  -For BV, start the Flagyl. Take 2 pills daily for 7 days. Make sure not to drink alcohol while on this medication.  -To prevent yeast infection, take one pill of diflucan on day 1 of Flagyl. You can take the second pill if you develop symptoms.  -We'll call you if the result of any of your labwork is positive.

## 2020-03-05 NOTE — ED Triage Notes (Signed)
Pt presents with mild abdominal pain and burning when urinating x 2 days. Pt reports the abdominal pain is common after she had the bariatric surgery 2 years ago. Denies fever, chills, nausea.

## 2020-03-07 ENCOUNTER — Telehealth (HOSPITAL_COMMUNITY): Payer: Self-pay | Admitting: Emergency Medicine

## 2020-03-07 LAB — CERVICOVAGINAL ANCILLARY ONLY
Bacterial Vaginitis (gardnerella): NEGATIVE
Candida Glabrata: NEGATIVE
Candida Vaginitis: NEGATIVE
Chlamydia: NEGATIVE
Comment: NEGATIVE
Comment: NEGATIVE
Comment: NEGATIVE
Comment: NEGATIVE
Comment: NEGATIVE
Comment: NORMAL
Neisseria Gonorrhea: NEGATIVE
Trichomonas: NEGATIVE

## 2020-03-07 LAB — URINE CULTURE: Culture: >=100000 — AB

## 2020-03-07 MED ORDER — NITROFURANTOIN MONOHYD MACRO 100 MG PO CAPS: 100.0000 mg | ORAL_CAPSULE | Freq: Two times a day (BID) | ORAL | 0 refills | Status: DC

## 2020-03-07 NOTE — Telephone Encounter (Signed)
Received a call from patient that prescription was not at [facility].  Verified, and it did not go through electronically due to power outage this mroning.  Called and gave verbal, as patient was waiting in parking lot

## 2020-03-10 ENCOUNTER — Ambulatory Visit: Payer: 59 | Admitting: Physician Assistant

## 2020-03-10 ENCOUNTER — Ambulatory Visit (INDEPENDENT_AMBULATORY_CARE_PROVIDER_SITE_OTHER)
Admission: RE | Admit: 2020-03-10 | Discharge: 2020-03-10 | Disposition: A | Discharge status: Home / Self Care | Payer: 59 | Source: Ambulatory Visit | Attending: Physician Assistant | Admitting: Physician Assistant

## 2020-03-10 ENCOUNTER — Ambulatory Visit (HOSPITAL_COMMUNITY)
Admission: RE | Admit: 2020-03-10 | Discharge: 2020-03-10 | Disposition: A | Discharge status: Home / Self Care | Payer: 59 | Source: Ambulatory Visit | Attending: Physician Assistant | Admitting: Physician Assistant

## 2020-03-10 ENCOUNTER — Other Ambulatory Visit: Payer: Self-pay

## 2020-03-10 VITALS — BP 174/108 | HR 84 | Temp 97.5°F | Ht 67.0 in | Wt 200.7 lb

## 2020-03-10 DIAGNOSIS — I739 Peripheral vascular disease, unspecified: Secondary | ICD-10-CM

## 2020-03-10 NOTE — Progress Notes (Signed)
History of Present Illness:  Patient is a 55 y.o. year old female who presents for evaluation of PAD.   s/p right femoral to above-knee popliteal bypass June 2013. This was done with a vein. The indication was for rest pain. She subsequently underwent amputation of toes 4 and 5 on the right foot. She had previously undergone a left femoral endarterectomy and left femoral-above-knee popliteal bypass in 2010.              She denise symptoms of claudication and rest pain at this time.  She is not taking Aspirin or statins at this time.  She has an allergy to statins.    She denise pain, symptoms of claudication or rest pain.   Past Medical History:  Diagnosis Date  . Anemia   . Anxiety    takes xanax daily  . Anxiety disorder   . Cataract    Mixed form OU  . Complication of anesthesia   . Depression, major    takes wellbutrin and celexa daily  . Diabetes mellitus    type 2   . Diabetic retinopathy ([place])    NPDR OU  . Fibromyalgia   . GERD (gastroesophageal reflux disease)    doesn't take any meds  . H/O hiatal hernia   . Hyperlipidemia   . Hypertension    takes Lisinopril and Diovan daily  . Hypertensive retinopathy    OU  . Hyperthyroidism    yrs ago but doesn't require meds for this  . Immune deficiency disorder ([place])    pt states she has no clue what this is  . Insomnia   . Joint pain   . Joint swelling   . PTSD (post-traumatic stress disorder)   . PTSD (post-traumatic stress disorder)   . PVD (peripheral vascular disease) ([place])   . Sleep apnea     Past Surgical History:  Procedure Laterality Date  . ABDOMINAL AORTAGRAM N/A 08/10/2011   Procedure: ABDOMINAL [name];  Surgeon: Elam Dutch, MD;  Location:  [facility] CATH LAB;  Service: Cardiovascular;  Laterality: N/A;  . AMPUTATION     lft foot toes 1.2.3  . AMPUTATION  01/07/2012   Procedure: AMPUTATION RAY;  Surgeon: Elam Dutch, MD;  Location: [facility] OR;  Service: Vascular;  Laterality: Right;  4TH & 5TH  toes  . aortogram    . CHOLECYSTECTOMY  1990  . COLONOSCOPY    . DILATION AND CURETTAGE OF UTERUS    . FEMORAL BYPASS  04/29/2008   left  . FEMORAL-POPLITEAL BYPASS GRAFT  08/27/2011   Procedure: BYPASS GRAFT FEMORAL-POPLITEAL ARTERY;  Surgeon: Elam Dutch, MD;  Location: [place];  Service: Vascular;  Laterality: Right;  . FEMORAL-POPLITEAL BYPASS GRAFT  08/29/2011   Procedure: BYPASS GRAFT FEMORAL-POPLITEAL ARTERY;  Surgeon: Elam Dutch, MD;  Location: [facility] OR;  Service: Vascular;  Laterality: Right;  Vein Patch Angionplasty and Thrombectomy   . GASTRIC ROUX-EN-Y N/A 09/02/2017   Procedure: LAPAROSCOPIC ROUX-EN-Y GASTRIC BYPASS WITH UPPER ENDOSCOPY AND ERAS PATHWAY;  Surgeon: Kinsinger, Arta Bruce, MD;  Location: [facility] ORS;  Service: General;  Laterality: N/A;  . INTRAOPERATIVE ARTERIOGRAM  08/29/2011   Procedure: INTRA OPERATIVE ARTERIOGRAM;  Surgeon: Elam Dutch, MD;  Location: [place];  Service: Vascular;  Laterality: Right;  . LOWER EXTREMITY ANGIOGRAM N/A 08/10/2011   Procedure: LOWER EXTREMITY ANGIOGRAM;  Surgeon: Elam Dutch, MD;  Location: [facility] CATH LAB;  Service: Cardiovascular;  Laterality: N/A;  . THYROIDECTOMY N/A 05/29/2019  Procedure: TOTAL THYROIDECTOMY;  Surgeon: Darnell Level, MD;  Location: [facility] ORS;  Service: General;  Laterality: N/A;  . TOE AMPUTATION  07/12/2008    left foot toes 1,2,3  . TUBAL LIGATION    . VASCULAR SURGERY      ROS:   General:  No weight loss, Fever, chills  HEENT: No recent headaches, no nasal bleeding, no visual changes, no sore throat  Neurologic: No dizziness, blackouts, seizures. No recent symptoms of stroke or mini- stroke. No recent episodes of slurred speech, or temporary blindness.  Cardiac: No recent episodes of chest pain/pressure, no shortness of breath at rest.  No shortness of breath with exertion.  Denies history of atrial fibrillation or irregular heartbeat  Vascular: No history of rest pain in feet.  No history of  claudication.  No history of non-healing ulcer, No history of DVT   Pulmonary: No home oxygen, no productive cough, no hemoptysis,  No asthma or wheezing  Musculoskeletal:  [ ]  Arthritis, [ ]  Low back pain,  [ ]  Joint pain  Hematologic:No history of hypercoagulable state.  No history of easy bleeding.  No history of anemia  Gastrointestinal: No hematochezia or melena,  No gastroesophageal reflux, no trouble swallowing  Urinary: [ ]  chronic Kidney disease, [ ]  on HD - [ ]  MWF or [ ]  TTHS, [ ]  Burning with urination, [ ]  Frequent urination, [ ]  Difficulty urinating;   Skin: No rashes  Psychological: No history of anxiety,  No history of depression  Social History Social History   Tobacco Use  . Smoking status: Former Smoker    Packs/day: 1.00    Years: 10.00    Pack years: 10.00    Types: Cigarettes    Quit date: 07/25/2008    Years since quitting: 11.6  . Smokeless tobacco: Never Used  . Tobacco comment: quit 40yrs ago  Vaping Use  . Vaping Use: Never used  Substance Use Topics  . Alcohol use: No  . Drug use: No    Family History Family History  Problem Relation Age of Onset  . Diabetes Mother   . Hyperlipidemia Mother   . Hypertension Mother   . Diabetes Father   . Heart disease Father        before age 47  . Hypertension Father   . Hyperlipidemia Father   . Hypertension Sister   . Diabetes Brother   . Glaucoma Paternal Grandfather     Allergies  Allergies  Allergen Reactions  . Amoxicillin-Pot Clavulanate Shortness Of Breath    Has patient had a PCN reaction causing immediate rash, facial/tongue/throat swelling, SOB or lightheadedness with hypotension: Yes Has patient had a PCN reaction causing severe rash involving mucus membranes or skin necrosis: No Has patient had a PCN reaction that required hospitalization: Yes Has patient had a PCN reaction occurring within the last 10 years: No If all of the above answers are "NO", then may proceed with  Cephalosporin use.   . Codeine Anaphylaxis  . Darvocet [Propoxyphene N-Acetaminophen] Anaphylaxis    Plain Tylenol ok  . Oxycodone Anaphylaxis  . Statins Other (See Comments)    Joint pain  . Propoxyphene Swelling     Current Outpatient Medications  Medication Sig Dispense Refill  . acetaminophen (TYLENOL) 325 MG tablet Take 2 tablets (650 mg total) by mouth every 6 (six) hours as needed for mild pain (or Fever >/= 101).    ALPRAZolam (XANAX) 0.5 MG tablet Take 0.5 mg by mouth 3 (three) times  daily as needed.    . calcium carbonate (OS-CAL - DOSED IN MG OF ELEMENTAL CALCIUM) 1250 (500 Ca) MG tablet Take 2 tablets (1,000 mg of elemental calcium total) by mouth 3 (three) times daily with meals. 100 tablet 0  . clonazePAM (KLONOPIN) 1 MG tablet Take 1 mg by mouth 3 (three) times daily.    . clopidogrel (PLAVIX) 75 MG tablet Take 75 mg by mouth every evening.    . desvenlafaxine (PRISTIQ) 50 MG 24 hr tablet 1 tablet    . docusate sodium (COLACE) 100 MG capsule Take 1 capsule (100 mg total) by mouth every 12 (twelve) hours. 60 capsule 0  . DULoxetine (CYMBALTA) 60 MG capsule Take 120 mg by mouth at bedtime.    Marland Kitchen levothyroxine (SYNTHROID) 88 MCG tablet Take 88 mcg by mouth every morning.    Marland Kitchen losartan (COZAAR) 25 MG tablet Take 25 mg by mouth daily.    . metroNIDAZOLE (FLAGYL) 500 MG tablet Take 1 tablet (500 mg total) by mouth 2 (two) times daily. 14 tablet 0  . nitrofurantoin, macrocrystal-monohydrate, (MACROBID) 100 MG capsule Take 1 capsule (100 mg total) by mouth 2 (two) times daily. 10 capsule 0  . OZEMPIC, 0.25 OR 0.5 MG/DOSE, 2 MG/1.5ML SOPN Inject 0.25 mg into the skin every Wednesday.     . traMADol (ULTRAM) 50 MG tablet Take 1 tablet (50 mg total) by mouth every 6 (six) hours as needed (mild pain). 10 tablet 0   No current facility-administered medications for this visit.    Physical Examination  Vitals:   03/10/20 1114  BP: (!) 174/108  Pulse: 84  Temp: (!) 97.5 F (36.4  C)  TempSrc: Skin  SpO2: 98%  Weight: 200 lb 11.2 oz (91 kg)  Height: 5\' 7"  (1.702 m)    Body mass index is 31.43 kg/m.  General:  Alert and oriented, no acute distress HEENT: Normal Neck: No bruit or JVD Pulmonary: Clear to auscultation bilaterally Cardiac: Regular Rate and Rhythm without murmur Abdomen: Soft, non-tender, non-distended, no mass, no scars Skin: No rash, feet warm without signs of ischemia  Extremity Pulses:  2+ radial, brachial, femoral, non palpable dorsalis pedis, posterior tibial pulses bilaterally Musculoskeletal: No deformity or edema  Neurologic: Upper and lower extremity motor 5/5 and symmetric  DATA:    ABI Findings:  +---------+------------------+-----+---------+--------+  Right  Rt Pressure (mmHg)IndexWaveform Comment   +---------+------------------+-----+---------+--------+  Brachial 161                      +---------+------------------+-----+---------+--------+  ATA   179        1.11            +---------+------------------+-----+---------+--------+  PTA   193        1.20 triphasic      +---------+------------------+-----+---------+--------+  DP                triphasic      +---------+------------------+-----+---------+--------+  Great Toe141        0.88            +---------+------------------+-----+---------+--------+   +---------+------------------+-----+--------+----------+  Left   Lt Pressure (mmHg)IndexWaveformComment    +---------+------------------+-----+--------+----------+  Brachial 161                      +---------+------------------+-----+--------+----------+  PTA   193        1.20            +---------+------------------+-----+--------+----------+  DP    168  1.04             +---------+------------------+-----+--------+----------+  Great Toe                amputation  +---------+------------------+-----+--------+----------+   +-------+-----------+-----------+------------+------------+  ABI/TBIToday's ABIToday's TBIPrevious ABIPrevious TBI  +-------+-----------+-----------+------------+------------+  Right 1.11    0.88    1.22    0.85      +-------+-----------+-----------+------------+------------+  Left  1.20    amputation 1.17    amputation   +-------+-----------+-----------+------------+------------+     Bilateral ABIs appear essentially unchanged compared to prior study on  03/20/2019.    Summary:  Right: Resting right ankle-brachial index is within normal range. No  evidence of significant right lower extremity arterial disease. The right  toe-brachial index is normal. RT great toe pressure = 141 mmHg.   Left: Resting left ankle-brachial index is within normal range. No  evidence of significant left lower extremity arterial disease.     +----------+--------+-----+--------+---------+--------+  RIGHT   PSV cm/sRatioStenosisWaveform Comments  +----------+--------+-----+--------+---------+--------+  CFA Distal111          triphasic      +----------+--------+-----+--------+---------+--------+       Right Graft #1: Fem-pop  +------------------+--------+--------+---------+--------+           PSV cm/sStenosisWaveform Comments  +------------------+--------+--------+---------+--------+  Inflow      125       biphasic       +------------------+--------+--------+---------+--------+  Prox Anastomosis 116       biphasic       +------------------+--------+--------+---------+--------+  Proximal Graft  76       biphasic       +------------------+--------+--------+---------+--------+  Mid  Graft     92       triphasic      +------------------+--------+--------+---------+--------+  Distal Graft   121       biphasic       +------------------+--------+--------+---------+--------+  Distal Anastomosis70       biphasic       +------------------+--------+--------+---------+--------+  Outflow      78       biphasic       +------------------+--------+--------+---------+--------+        +----------+--------+-----+--------+---------+--------+  LEFT   PSV cm/sRatioStenosisWaveform Comments  +----------+--------+-----+--------+---------+--------+  CFA Distal144          triphasic      +----------+--------+-----+--------+---------+--------+  POP Prox 67                     +----------+--------+-----+--------+---------+--------+     Left Graft #1: Fem-pop  +--------------------+--------+--------+---------+--------+            PSV cm/sStenosisWaveform Comments  +--------------------+--------+--------+---------+--------+  Inflow       137       triphasic      +--------------------+--------+--------+---------+--------+  Proximal Anastomosis110       biphasic       +--------------------+--------+--------+---------+--------+  Proximal Graft   71       biphasic       +--------------------+--------+--------+---------+--------+  Mid Graft      86       biphasic       +--------------------+--------+--------+---------+--------+  Distal Graft    71       biphasic       +--------------------+--------+--------+---------+--------+  Distal Anastomosis 78       biphasic       +--------------------+--------+--------+---------+--------+  Outflow       76       biphasic        +--------------------+--------+--------+---------+--------+   Summary:  Right: Patent fem-pop bypass graft with  no evidence of stenosis.   Left: Patent fem-pop bypass graft with no evidence of stenosis.     ASSESSMENT:  s/p right femoral to above-knee popliteal bypass June 2013. This was done with a vein. The indication was for rest pain. She subsequently underwent amputation of toes 4 and 5 on the right foot. She had previously undergone a left femoral endarterectomy and left femoral-above-knee popliteal bypass in 2010.  Her ABI's and arterial duplex are essentially unchanged without signs of stenosis and stable.  PLAN: Continue to monitor her feet and legs for changes if she develops symptoms of claudication, rest pain or non healing wounds she will call.  Otherwise she will f/u in 1 year for repeat studies.     Mosetta Pigeon PA-C [facility] Office: [phone]  MD in clinic [place]

## 2020-03-15 ENCOUNTER — Encounter (HOSPITAL_COMMUNITY): Payer: Self-pay

## 2020-03-30 ENCOUNTER — Encounter: Payer: Self-pay | Admitting: Family Medicine

## 2020-03-30 ENCOUNTER — Ambulatory Visit: Payer: 59 | Admitting: Family Medicine

## 2020-03-30 NOTE — Patient Instructions (Incomplete)
Please continue using your CPAP regularly. While your insurance requires that you use CPAP at least 4 hours each night on 70% of the nights, I recommend, that you not skip any nights and use it throughout the night if you can. Getting used to CPAP and staying with the treatment long term does take time and patience and discipline. Untreated obstructive sleep apnea when it is moderate to severe can have an adverse impact on cardiovascular health and raise her risk for heart disease, arrhythmias, hypertension, congestive heart failure, stroke and diabetes. Untreated obstructive sleep apnea causes sleep disruption, nonrestorative sleep, and sleep deprivation. This can have an impact on your day to day functioning and cause daytime sleepiness and impairment of cognitive function, memory loss, mood disturbance, and problems focussing. Using CPAP regularly can improve these symptoms.     Sleep Apnea Sleep apnea affects breathing during sleep. It causes breathing to stop for a short time or to become shallow. It can also increase the risk of:  Heart attack.  Stroke.  Being very overweight (obese).  Diabetes.  Heart failure.  Irregular heartbeat. The goal of treatment is to help you breathe normally again. What are the causes? There are three kinds of sleep apnea:  Obstructive sleep apnea. This is caused by a blocked or collapsed airway.  Central sleep apnea. This happens when the brain does not send the right signals to the muscles that control breathing.  Mixed sleep apnea. This is a combination of obstructive and central sleep apnea. The most common cause of this condition is a collapsed or blocked airway. This can happen if:  Your throat muscles are too relaxed.  Your tongue and tonsils are too large.  You are overweight.  Your airway is too small.   What increases the risk?  Being overweight.  Smoking.  Having a small airway.  Being older.  Being female.  Drinking  alcohol.  Taking medicines to calm yourself (sedatives or tranquilizers).  Having family members with the condition. What are the signs or symptoms?  Trouble staying asleep.  Being sleepy or tired during the day.  Getting angry a lot.  Loud snoring.  Headaches in the morning.  Not being able to focus your mind (concentrate).  Forgetting things.  Less interest in sex.  Mood swings.  Personality changes.  Feelings of sadness (depression).  Waking up a lot during the night to pee (urinate).  Dry mouth.  Sore throat. How is this diagnosed?  Your medical history.  A physical exam.  A test that is done when you are sleeping (sleep study). The test is most often done in a sleep lab but may also be done at home. How is this treated?  Sleeping on your side.  Using a medicine to get rid of mucus in your nose (decongestant).  Avoiding the use of alcohol, medicines to help you relax, or certain pain medicines (narcotics).  Losing weight, if needed.  Changing your diet.  Not smoking.  Using a machine to open your airway while you sleep, such as: ? An oral appliance. This is a mouthpiece that shifts your lower jaw forward. ? A CPAP device. This device blows air through a mask when you breathe out (exhale). ? An EPAP device. This has valves that you put in each nostril. ? A BPAP device. This device blows air through a mask when you breathe in (inhale) and breathe out.  Having surgery if other treatments do not work. It is important to get treatment   for sleep apnea. Without treatment, it can lead to:  High blood pressure.  Coronary artery disease.  In men, not being able to have an erection (impotence).  Reduced thinking ability.   Follow these instructions at home: Lifestyle  Make changes that your doctor recommends.  Eat a healthy diet.  Lose weight if needed.  Avoid alcohol, medicines to help you relax, and some pain medicines.  Do not use any  products that contain nicotine or tobacco, such as cigarettes, e-cigarettes, and chewing tobacco. If you need help quitting, ask your doctor. General instructions  Take over-the-counter and prescription medicines only as told by your doctor.  If you were given a machine to use while you sleep, use it only as told by your doctor.  If you are having surgery, make sure to tell your doctor you have sleep apnea. You may need to bring your device with you.  Keep all follow-up visits as told by your doctor. This is important. Contact a doctor if:  The machine that you were given to use during sleep bothers you or does not seem to be working.  You do not get better.  You get worse. Get help right away if:  Your chest hurts.  You have trouble breathing in enough air.  You have an uncomfortable feeling in your back, arms, or stomach.  You have trouble talking.  One side of your body feels weak.  A part of your face is hanging down. These symptoms may be an emergency. Do not wait to see if the symptoms will go away. Get medical help right away. Call your local emergency services (911 in the [place] Do not drive yourself to the hospital. Summary  This condition affects breathing during sleep.  The most common cause is a collapsed or blocked airway.  The goal of treatment is to help you breathe normally while you sleep. This information is not intended to replace advice given to you by your health care provider. Make sure you discuss any questions you have with your health care provider. Document Revised: 12/06/2017 Document Reviewed: 10/15/2017 Elsevier Patient Education  2021 [employer]  

## 2020-03-30 NOTE — Progress Notes (Deleted)
PATIENT: Weston SettleGloria T Zalesky DOB: 1965/07/30  REASON FOR VISIT: follow up HISTORY FROM: patient  No chief complaint on file.    HISTORY OF PRESENT ILLNESS: Today 03/30/20  Malachi BondsGloria returns today for follow-up for OSA on CPAP therapy.  At previous visit she had not been using CPAP.  With review of the download several weeks following her last appointment, she had reported that her machine was not working correctly.  She was advised to reach out to her DME company.  She also expressed some concerns regarding validity of last sleep test.  We discussed limitations of repeating study with financial support from her insurance company and offered to help her set up HST if she wished to pursue cash pay options.   Since, she has used CPAP more consistently.  She admits that she does not use CPAP every night.   Compliance report dated 02/28/2020 through 03/28/2020 reveals that she used CPAP 18 of the past 30 days for compliance of 60%.  She used CPAP greater than 4 hours 15 of the past 30 days for compliance of 50%.  Average usage on days used was 5 hours and 45 minutes.  Residual AHI was 6.7 on 5 to 12 cm of water and an EPR of 3.  Pressures in the 93rd percentile of 11.2 with maximum pressure of 11.8 cm of water.  There was a leak noted in the 93rd percentile of 20 L/min.    Weston SettleGloria T Mccorkel is a 55 y.o. female here today for follow up for OSA on CPAP.  She has not used her CPAP machine consistently since last being seen in December 2020.  She does wish to resume therapy as she notes improved sleep quality when using CPAP.  She denies any difficulty with her machine or the mask.    Compliance report dated 09/24/2019 through 12/22/2019 reveals that she used CPAP for of the past 90 days for compliance of 4%.  She used CPAP greater than 4 hours 1 day.  There was no leak noted.   HISTORY: (copied from [place] Mill[place]'s note on 02/25/2019)  Ms. Fenton Mallinghorpe is a 55 year old female with a history of obstructive  sleep apnea on CPAP.  His download indicates that he uses machine 30 out of 30 days for compliance of 100%.  She uses machine greater then 4 hours 15 days for compliance of 50%.  On average she uses her machine 3 hours and 55 minutes.  Her residual AHI is 12.3 on 5-12 cm of water.  Her pressure in the 95th percentile is 11.8 with maximum pressure at 12.  She does not have a significant leak.  She reports that she is still trying to get used to using the CPAP machine.  She also states that she was recently diagnosed with a goiter and will be having surgery.  She returns today for an evaluation.  CT soft tissue neck with contrast: Diffuse enlargement of the thyroid gland, more on the left lobe than the right, without discernible focal nodule. Recent ultrasound showed a diffusely heterogeneous nature. The gland has enlarged further since the study of 2007. Cephalo caudal length of the right lobe at that time was 7.3 cm in the left lobe was 7.8 cm.  HISTORY 11/19/2018: She reports that she has lost quite a bit of weight, she estimates that she lost 60 to 70 pounds since her maximum weight, she had gastric bypass surgery on 09/02/2017. She wants to lose more weight, her goal is to be  in the 160s. She has developed issues with her thyroid, goiter, she may be looking at thyroidectomy. She has an appointment with the endocrinologist next week, she believes it is [name]. She had difficulty tolerating the CPAP, and was uncomfortable, she could not sleep and the position she wanted to, she took off the mask in the middle of the night. She lost coverage of her CPAP shortly before her bariatric surgery but does report she needed either additional oxygen or ventilatory support after her bariatric surgery as she recalls. She still snores.  The patient's allergies, current medications, family history, past medical history, past social history, past surgical history and problem list were reviewed and updated as  appropriate.   REVIEW OF SYSTEMS: Out of a complete 14 system review of symptoms, the patient complains only of the following symptoms, fatigue and all other reviewed systems are negative.  ESS: 10 Fatigue: 63   ALLERGIES: Allergies  Allergen Reactions  . Amoxicillin-Pot Clavulanate Shortness Of Breath    Has patient had a PCN reaction causing immediate rash, facial/tongue/throat swelling, SOB or lightheadedness with hypotension: Yes Has patient had a PCN reaction causing severe rash involving mucus membranes or skin necrosis: No Has patient had a PCN reaction that required hospitalization: Yes Has patient had a PCN reaction occurring within the last 10 years: No If all of the above answers are "NO", then may proceed with Cephalosporin use.   . Codeine Anaphylaxis  . Darvocet [Propoxyphene N-Acetaminophen] Anaphylaxis    Plain Tylenol ok  . Oxycodone Anaphylaxis  . Statins Other (See Comments)    Joint pain  . Propoxyphene Swelling    HOME MEDICATIONS: Outpatient Medications Prior to Visit  Medication Sig Dispense Refill  . acetaminophen (TYLENOL) 325 MG tablet Take 2 tablets (650 mg total) by mouth every 6 (six) hours as needed for mild pain (or Fever >/= 101).    Marland Kitchen ALPRAZolam (XANAX) 0.5 MG tablet Take 0.5 mg by mouth 3 (three) times daily as needed.    . calcium carbonate (OS-CAL - DOSED IN MG OF ELEMENTAL CALCIUM) 1250 (500 Ca) MG tablet Take 2 tablets (1,000 mg of elemental calcium total) by mouth 3 (three) times daily with meals. 100 tablet 0  . clonazePAM (KLONOPIN) 1 MG tablet Take 1 mg by mouth 3 (three) times daily.    . clopidogrel (PLAVIX) 75 MG tablet Take 75 mg by mouth every evening.    . desvenlafaxine (PRISTIQ) 50 MG 24 hr tablet 1 tablet    . docusate sodium (COLACE) 100 MG capsule Take 1 capsule (100 mg total) by mouth every 12 (twelve) hours. 60 capsule 0  . DULoxetine (CYMBALTA) 60 MG capsule Take 120 mg by mouth at bedtime.    Marland Kitchen levothyroxine (SYNTHROID)  88 MCG tablet Take 88 mcg by mouth every morning.    Marland Kitchen losartan (COZAAR) 25 MG tablet Take 25 mg by mouth daily.    . metroNIDAZOLE (FLAGYL) 500 MG tablet Take 1 tablet (500 mg total) by mouth 2 (two) times daily. 14 tablet 0  . nitrofurantoin, macrocrystal-monohydrate, (MACROBID) 100 MG capsule Take 1 capsule (100 mg total) by mouth 2 (two) times daily. 10 capsule 0  . OZEMPIC, 0.25 OR 0.5 MG/DOSE, 2 MG/1.5ML SOPN Inject 0.25 mg into the skin every Wednesday.     . traMADol (ULTRAM) 50 MG tablet Take 1 tablet (50 mg total) by mouth every 6 (six) hours as needed (mild pain). 10 tablet 0   No facility-administered medications prior to visit.  PAST MEDICAL HISTORY: Past Medical History:  Diagnosis Date  . Anemia   . Anxiety    takes xanax daily  . Anxiety disorder   . Cataract    Mixed form OU  . Complication of anesthesia   . Depression, major    takes wellbutrin and celexa daily  . Diabetes mellitus    type 2   . Diabetic retinopathy (HCC)    NPDR OU  . Fibromyalgia   . GERD (gastroesophageal reflux disease)    doesn't take any meds  . H/O hiatal hernia   . Hyperlipidemia   . Hypertension    takes Lisinopril and Diovan daily  . Hypertensive retinopathy    OU  . Hyperthyroidism    yrs ago but doesn't require meds for this  . Immune deficiency disorder (HCC)    pt states she has no clue what this is  . Insomnia   . Joint pain   . Joint swelling   . PTSD (post-traumatic stress disorder)   . PTSD (post-traumatic stress disorder)   . PVD (peripheral vascular disease) (HCC)   . Sleep apnea     PAST SURGICAL HISTORY: Past Surgical History:  Procedure Laterality Date  . ABDOMINAL AORTAGRAM N/A 08/10/2011   Procedure: ABDOMINAL Ronny Flurry;  Surgeon: Sherren Kerns, MD;  Location: [facility] CATH LAB;  Service: Cardiovascular;  Laterality: N/A;  . AMPUTATION     lft foot toes 1.2.3  . AMPUTATION  01/07/2012   Procedure: AMPUTATION RAY;  Surgeon: Sherren Kerns, MD;  Location:  [facility] OR;  Service: Vascular;  Laterality: Right;  4TH & 5TH toes  . aortogram    . CHOLECYSTECTOMY  1990  . COLONOSCOPY    . DILATION AND CURETTAGE OF UTERUS    . FEMORAL BYPASS  04/29/2008   left  . FEMORAL-POPLITEAL BYPASS GRAFT  08/27/2011   Procedure: BYPASS GRAFT FEMORAL-POPLITEAL ARTERY;  Surgeon: Sherren Kerns, MD;  Location: [facility] OR;  Service: Vascular;  Laterality: Right;  . FEMORAL-POPLITEAL BYPASS GRAFT  08/29/2011   Procedure: BYPASS GRAFT FEMORAL-POPLITEAL ARTERY;  Surgeon: Sherren Kerns, MD;  Location[facility] OR;  Service: Vascular;  Laterality: Right;  Vein Patch Angionplasty and Thrombectomy   . GASTRIC ROUX-EN-Y N/A 09/02/2017   Procedure: LAPAROSCOPIC ROUX-EN-Y GASTRIC BYPASS WITH UPPER ENDOSCOPY AND ERAS PATHWAY;  Surgeon: Kinsinger, De Blanch, MD;  Location: [facility] ORS;  Service: General;  Laterality: N/A;  . INTRAOPERATIVE ARTERIOGRAM  08/29/2011   Procedure: INTRA OPERATIVE ARTERIOGRAM;  Surgeon: Sherren Kerns, MD;  Location: [facility] OR;  Service: Vascular;  Laterality: Right;  . LOWER EXTREMITY ANGIOGRAM N/A 08/10/2011   Procedure: LOWER EXTREMITY ANGIOGRAM;  Surgeon: Sherren Kerns, MD;  Location: [facility] CATH LAB;  Service: Cardiovascular;  Laterality: N/A;  . THYROIDECTOMY N/A 05/29/2019   Procedure: TOTAL THYROIDECTOMY;  Surgeon: Darnell Level, MD;  Location: [facility] ORS;  Service: General;  Laterality: N/A;  . TOE AMPUTATION  07/12/2008    left foot toes 1,2,3  . TUBAL LIGATION    . VASCULAR SURGERY      FAMILY HISTORY: Family History  Problem Relation Age of Onset  . Diabetes Mother   . Hyperlipidemia Mother   . Hypertension Mother   . Diabetes Father   . Heart disease Father        before age 57  . Hypertension Father   . Hyperlipidemia Father   . Hypertension Sister   . Diabetes Brother   . Glaucoma Paternal Grandfather     SOCIAL HISTORY: Social  History   Socioeconomic History  . Marital status: Divorced    Spouse name: Not on file  . Number of children: Not on  file  . Years of education: Not on file  . Highest education level: Not on file  Occupational History  . Not on file  Tobacco Use  . Smoking status: Former Smoker    Packs/day: 1.00    Years: 10.00    Pack years: 10.00    Types: Cigarettes    Quit date: 07/25/2008    Years since quitting: 11.6  . Smokeless tobacco: Never Used  . Tobacco comment: quit 70yrs ago  Vaping Use  . Vaping Use: Never used  Substance and Sexual Activity  . Alcohol use: No  . Drug use: No  . Sexual activity: Not Currently  Other Topics Concern  . Not on file  Social History Narrative  . Not on file   Social Determinants of Health   Financial Resource Strain: Not on file  Food Insecurity: Not on file  Transportation Needs: Not on file  Physical Activity: Not on file  Stress: Not on file  Social Connections: Not on file  Intimate Partner Violence: Not on file     PHYSICAL EXAM  There were no vitals filed for this visit. There is no height or weight on file to calculate BMI.  Generalized: Well developed, in no acute distress  Cardiology: normal rate and rhythm, no murmur noted Respiratory: clear to auscultation bilaterally  Neurological examination  Mentation: Alert oriented to time, place, history taking. Follows all commands speech and language fluent Cranial nerve II-XII: Pupils were equal round reactive to light. Extraocular movements were full, visual field were full  Motor: The motor testing reveals 5 over 5 strength of all 4 extremities. Good symmetric motor tone is noted throughout.  Gait and station: Gait is normal.    DIAGNOSTIC DATA (LABS, IMAGING, TESTING) - I reviewed patient records, labs, notes, testing and imaging myself where available.  No flowsheet data found.   Lab Results  Component Value Date   WBC 5.9 05/25/2019   HGB 12.5 05/25/2019   HCT 39.7 05/25/2019   MCV 95.0 05/25/2019   PLT 249 05/25/2019      Component Value Date/Time   NA 137 05/30/2019 0530   K  5.4 (H) 05/30/2019 0530   CL 101 05/30/2019 0530   CO2 26 05/30/2019 0530   GLUCOSE 232 (H) 05/30/2019 0530   BUN 28 (H) 05/30/2019 0530   CREATININE 1.12 (H) 05/30/2019 0530   CALCIUM 8.4 (L) 05/30/2019 0530   PROT 7.2 10/16/2017 1135   ALBUMIN 3.3 (L) 10/16/2017 1135   AST 19 10/16/2017 1135   ALT 22 10/16/2017 1135   ALKPHOS 78 10/16/2017 1135   BILITOT 1.1 10/16/2017 1135   GFRNONAA 56 (L) 05/30/2019 0530   GFRAA >60 05/30/2019 0530   Lab Results  Component Value Date   CHOL 197 06/16/2014   HDL 30 (L) 06/16/2014   LDLCALC 125 (H) 06/16/2014   TRIG 211 (H) 06/16/2014   CHOLHDL 6.6 06/16/2014   Lab Results  Component Value Date   HGBA1C 9.5 (H) 05/25/2019   No results found for: [id number] Lab Results  Component Value Date   TSH 0.605 02/23/2008     ASSESSMENT AND PLAN 55 y.o. year old female  has a past medical history of Anemia, Anxiety, Anxiety disorder, Cataract, Complication of anesthesia, Depression, major, Diabetes mellitus, Diabetic retinopathy (HCC), Fibromyalgia, GERD (gastroesophageal reflux disease), H/O hiatal hernia, Hyperlipidemia,  Hypertension, Hypertensive retinopathy, Hyperthyroidism, Immune deficiency disorder (HCC), Insomnia, Joint pain, Joint swelling, PTSD (post-traumatic stress disorder), PTSD (post-traumatic stress disorder), PVD (peripheral vascular disease) (HCC), and Sleep apnea. here with   No diagnosis found.   ALEKSA COLLINSWORTH has been more consistent with CPAP usage over the past 30 days.  Although compliance data continues to support suboptimal usage, it is much improved from last visit in October.  AHI remains elevated at 6.7 events per hour.  Leak is elevated and at threshold of 20 L/min. She was encouraged to continue using CPAP nightly and for greater than 4 hours each night.   No orders of the defined types were placed in this encounter.    No orders of the defined types were placed in this encounter.     I spent 15 minutes  with the patient. 50% of this time was spent counseling and educating patient on plan of care and medications.    Shawnie Dapper, FNP-C 03/30/2020, 8:50 AM [facility] [address] [place], [place] [address] [phone]

## 2020-04-14 DIAGNOSIS — A6000 Herpesviral infection of urogenital system, unspecified: Secondary | ICD-10-CM | POA: Insufficient documentation

## 2020-04-27 ENCOUNTER — Other Ambulatory Visit: Payer: Self-pay

## 2020-04-27 ENCOUNTER — Ambulatory Visit: Payer: 59 | Admitting: Podiatry

## 2020-04-27 DIAGNOSIS — L84 Corns and callosities: Secondary | ICD-10-CM

## 2020-04-27 DIAGNOSIS — E0843 Diabetes mellitus due to underlying condition with diabetic autonomic (poly)neuropathy: Secondary | ICD-10-CM | POA: Diagnosis not present

## 2020-04-27 NOTE — Progress Notes (Signed)
Visit  SUBJECTIVE Patient with a history of type 2 diabetes mellitus presents to office today complaining of pain and sensitivity associated to a painful hyperkeratotic callus lesion noted to the plantar aspect of the left forefoot that is very symptomatic.  Patient is here for further evaluation and treatment.   Past Medical History:  Diagnosis Date  . Anemia   . Anxiety    takes xanax daily  . Anxiety disorder   . Cataract    Mixed form OU  . Complication of anesthesia   . Depression, major    takes wellbutrin and celexa daily  . Diabetes mellitus    type 2   . Diabetic retinopathy (HCC)    NPDR OU  . Fibromyalgia   . GERD (gastroesophageal reflux disease)    doesn't take any meds  . H/O hiatal hernia   . Hyperlipidemia   . Hypertension    takes Lisinopril and Diovan daily  . Hypertensive retinopathy    OU  . Hyperthyroidism    yrs ago but doesn't require meds for this  . Immune deficiency disorder (HCC)    pt states she has no clue what this is  . Insomnia   . Joint pain   . Joint swelling   . PTSD (post-traumatic stress disorder)   . PTSD (post-traumatic stress disorder)   . PVD (peripheral vascular disease) (HCC)   . Sleep apnea     OBJECTIVE General Patient is awake, alert, and oriented x 3 and in no acute distress. Derm Skin is dry and supple bilateral. Negative open lesions or macerations. Remaining integument unremarkable. Nails are tender, long, thickened and dystrophic with subungual debris, consistent with onychomycosis, digits 4-5 left and 1-3 right. No signs of infection noted.  Hyperkeratotic preulcerative callus lesion noted to the subsecond MTPJ left Vasc  DP and PT pedal pulses palpable bilaterally. Temperature gradient within normal limits.  Neuro Epicritic and protective threshold sensation diminished bilaterally.  Musculoskeletal Exam No symptomatic pedal deformities noted bilateral. Muscular strength within normal limits.  ASSESSMENT 1.  Diabetes Mellitus w/ peripheral neuropathy 2. Onychomycosis of nail due to dermatophyte bilateral 3. Pain in foot bilateral 4. H/o toe amputation 1-3 left 5. H/o toe amputation 4-5 right  6. PVD BLE 7.  Preulcerative callus underlying the second metatarsal head of the left foot  PLAN OF CARE 1. Patient evaluated today. 2. Instructed to maintain good pedal hygiene and foot care. Stressed importance of controlling blood sugar.  3.  Excisional debridement of the hyperkeratotic callus was performed using a 312 scalpel without incident or bleeding. 4.  I explained to the patient that the callus is coming from the head of the second metatarsal.  The patient has been dealing with this painful symptomatic callus for several years now.  I do believe that second metatarsal head resection would help alleviate her pain.  The patient is getting new insurance in June/July and she would like to return to clinic after that insurance change to discuss surgery 5.  Return to clinic 4 months  Recently had bariatric surgery and lost 45 pounds.    Felecia Shelling, DPM [facility]  Dr. Felecia Shelling, DPM    [address], [place] [address]  Office [phone]  Fax [phone]

## 2020-05-07 ENCOUNTER — Ambulatory Visit
Admission: EM | Admit: 2020-05-07 | Discharge: 2020-05-07 | Disposition: A | Discharge status: Home / Self Care | Payer: 59 | Attending: Emergency Medicine | Admitting: Emergency Medicine

## 2020-05-07 ENCOUNTER — Other Ambulatory Visit: Payer: Self-pay

## 2020-05-07 DIAGNOSIS — N390 Urinary tract infection, site not specified: Secondary | ICD-10-CM | POA: Diagnosis present

## 2020-05-07 LAB — POCT URINALYSIS DIP (MANUAL ENTRY)
Bilirubin, UA: NEGATIVE
Glucose, UA: 250 mg/dL — AB
Ketones, POC UA: NEGATIVE mg/dL
Nitrite, UA: NEGATIVE
Spec Grav, UA: >=1.03 — AB (ref 1.010–1.025)
Urobilinogen, UA: 0.2 E.U./dL
pH, UA: 5.5 (ref 5.0–8.0)

## 2020-05-07 MED ORDER — SULFAMETHOXAZOLE-TRIMETHOPRIM 800-160 MG PO TABS: 1.0000 | ORAL_TABLET | Freq: Two times a day (BID) | ORAL | 0 refills | Status: DC

## 2020-05-07 MED ORDER — FLUCONAZOLE 150 MG PO TABS: 150.0000 mg | ORAL_TABLET | Freq: Once | ORAL | 0 refills | Status: AC

## 2020-05-07 NOTE — ED Triage Notes (Signed)
Pt present urinary frequency, symptoms started two days ago. Pt is experience some pain and pressure in the bottom of her stomach.

## 2020-05-07 NOTE — Discharge Instructions (Signed)
Begin Bactrim twice daily for 3 days to treat UTI Drink plenty of fluids 1 tab of Diflucan today, repeat after completing Bactrim to treat yeast infection Vaginal swab and urine culture pending for more definitive results, we will call if abnormal and add medicines if needed  Please follow-up if any symptoms not improving or worsening

## 2020-05-07 NOTE — ED Provider Notes (Signed)
[name]    CSN: [id number] Arrival date & time: 05/07/20  1314      History   Chief Complaint Chief Complaint  Patient presents with  . Urinary Frequency    HPI Kelly Barber is a 55 y.o. female history of DM type II, glaucoma, presenting today for evaluation of possible UTI.  Reports urinary frequency urgency, dysuria as well as lower abdominal pressure over the past few days.  Also reports some slight vaginal irritation as well.  Reports history of UTIs and yeast infections.  Denies fevers nausea or vomiting.  HPI  Past Medical History:  Diagnosis Date  . Anemia   . Anxiety    takes xanax daily  . Anxiety disorder   . Cataract    Mixed form OU  . Complication of anesthesia   . Depression, major    takes wellbutrin and celexa daily  . Diabetes mellitus    type 2   . Diabetic retinopathy (HCC)    NPDR OU  . Fibromyalgia   . GERD (gastroesophageal reflux disease)    doesn't take any meds  . H/O hiatal hernia   . Hyperlipidemia   . Hypertension    takes Lisinopril and Diovan daily  . Hypertensive retinopathy    OU  . Hyperthyroidism    yrs ago but doesn't require meds for this  . Immune deficiency disorder (HCC)    pt states she has no clue what this is  . Insomnia   . Joint pain   . Joint swelling   . PTSD (post-traumatic stress disorder)   . PTSD (post-traumatic stress disorder)   . PVD (peripheral vascular disease) (HCC)   . Sleep apnea     Patient Active Problem List   Diagnosis Date Noted  . Substernal thyroid goiter 05/26/2019  . Subclinical hyperthyroidism 05/26/2019  . Morbid obesity (HCC) 09/02/2017  . Glaucoma 06/15/2014  . S/P amputation of foot (HCC) 06/14/2014  . Depression 06/14/2014  . Diabetic neuropathy (HCC) 06/14/2014  . Aftercare following surgery of the circulatory system, NEC 12/20/2011  . Atherosclerosis of native arteries of the extremities with ulceration(440.23) 12/20/2011  . Preop cardiovascular exam  08/16/2011  . Atherosclerosis of native arteries of the extremities with intermittent claudication 07/26/2011  . CELLULITIS AND ABSCESS OF TRUNK 08/09/2008  . LOWER LIMB AMPUTATION, OTHER TOE 08/03/2008  . PERIPHERAL VASCULAR DISEASE 04/29/2008  . LEUKOCYTOCLASTIC VASCULITIS 04/28/2008  . VASCULAR PURPURA 04/21/2008  . GERD 02/23/2008  . GOITER, MULTINODULAR 12/19/2006  . Diabetes (HCC) 12/19/2006  . ANXIETY 12/19/2006  . DEPRESSION 12/19/2006  . Essential hypertension 12/19/2006  . GALLSTONES 12/19/2006  . FIBROMYALGIA 12/19/2006  . WEIGHT GAIN 12/19/2006  . CHEST PAIN, NON-CARDIAC 12/19/2006    Past Surgical History:  Procedure Laterality Date  . ABDOMINAL AORTAGRAM N/A 08/10/2011   Procedure: ABDOMINAL Ronny Flurry;  Surgeon: Sherren Kerns, MD;  Location: [facility] CATH LAB;  Service: Cardiovascular;  Laterality: N/A;  . AMPUTATION     lft foot toes 1.2.3  . AMPUTATION  01/07/2012   Procedure: AMPUTATION RAY;  Surgeon: Sherren Kerns, MD;  Location: [facility] OR;  Service: Vascular;  Laterality: Right;  4TH & 5TH toes  . aortogram    . CHOLECYSTECTOMY  1990  . COLONOSCOPY    . DILATION AND CURETTAGE OF UTERUS    . FEMORAL BYPASS  04/29/2008   left  . FEMORAL-POPLITEAL BYPASS GRAFT  08/27/2011   Procedure: BYPASS GRAFT FEMORAL-POPLITEAL ARTERY;  Surgeon: Sherren Kerns, MD;  Location:  [facility] OR;  Service: Vascular;  Laterality: Right;  . FEMORAL-POPLITEAL BYPASS GRAFT  08/29/2011   Procedure: BYPASS GRAFT FEMORAL-POPLITEAL ARTERY;  Surgeon: Sherren Kerns, MD;  Location: [facility] OR;  Service: Vascular;  Laterality: Right;  Vein Patch Angionplasty and Thrombectomy   . GASTRIC ROUX-EN-Y N/A 09/02/2017   Procedure: LAPAROSCOPIC ROUX-EN-Y GASTRIC BYPASS WITH UPPER ENDOSCOPY AND ERAS PATHWAY;  Surgeon: Kinsinger, De Blanch, MD;  Location: [facility] ORS;  Service: General;  Laterality: N/A;  . INTRAOPERATIVE ARTERIOGRAM  08/29/2011   Procedure: INTRA OPERATIVE ARTERIOGRAM;  Surgeon: Sherren Kerns, MD;   Location: [facility] OR;  Service: Vascular;  Laterality: Right;  . LOWER EXTREMITY ANGIOGRAM N/A 08/10/2011   Procedure: LOWER EXTREMITY ANGIOGRAM;  Surgeon: Sherren Kerns, MD;  Location: [facility] CATH LAB;  Service: Cardiovascular;  Laterality: N/A;  . THYROIDECTOMY N/A 05/29/2019   Procedure: TOTAL THYROIDECTOMY;  Surgeon: Darnell Level, MD;  Location: [facility] ORS;  Service: General;  Laterality: N/A;  . TOE AMPUTATION  07/12/2008    left foot toes 1,2,3  . TUBAL LIGATION    . VASCULAR SURGERY      OB History   No obstetric history on file.      Home Medications    Prior to Admission medications   Medication Sig Start Date End Date Taking? Authorizing Provider  fluconazole (DIFLUCAN) 150 MG tablet Take 1 tablet (150 mg total) by mouth once for 1 dose. 05/07/20 05/07/20 Yes Srinivas Lippman C, PA-C  sulfamethoxazole-trimethoprim (BACTRIM DS) 800-160 MG tablet Take 1 tablet by mouth 2 (two) times daily for 3 days. 05/07/20 05/10/20 Yes Dandra Velardi C, PA-C  acetaminophen (TYLENOL) 325 MG tablet Take 2 tablets (650 mg total) by mouth every 6 (six) hours as needed for mild pain (or Fever >/= 101). 05/30/19   Berna Bue, MD  ALPRAZolam Prudy Feeler) 0.5 MG tablet Take 0.5 mg by mouth 3 (three) times daily as needed. 07/07/19   [provider]  calcium carbonate (OS-CAL - DOSED IN MG OF ELEMENTAL CALCIUM) 1250 (500 Ca) MG tablet Take 2 tablets (1,000 mg of elemental calcium total) by mouth 3 (three) times daily with meals. 05/30/19   Berna Bue, MD  clonazePAM (KLONOPIN) 1 MG tablet Take 1 mg by mouth 3 (three) times daily. 06/23/19   [provider]  clopidogrel (PLAVIX) 75 MG tablet Take 75 mg by mouth every evening.    [provider]  desvenlafaxine (PRISTIQ) 50 MG 24 hr tablet 1 tablet    [provider]  docusate sodium (COLACE) 100 MG capsule Take 1 capsule (100 mg total) by mouth every 12 (twelve) hours. 03/05/20   Rhys Martini, PA-C  DULoxetine (CYMBALTA) 60 MG  capsule Take 120 mg by mouth at bedtime.    [provider]  levothyroxine (SYNTHROID) 88 MCG tablet Take 88 mcg by mouth every morning. 06/29/19   [provider]  losartan (COZAAR) 25 MG tablet Take 25 mg by mouth daily. 07/07/19   [provider]  OZEMPIC, 0.25 OR 0.5 MG/DOSE, 2 MG/1.5ML SOPN Inject 0.25 mg into the skin every Wednesday.  03/13/19   [provider]  traMADol (ULTRAM) 50 MG tablet Take 1 tablet (50 mg total) by mouth every 6 (six) hours as needed (mild pain). 05/30/19   Berna Bue, MD  pregabalin (LYRICA) 75 MG capsule Take 75 mg by mouth every evening.  03/05/20  [provider]    Family History Family History  Problem Relation Age of Onset  .  Diabetes Mother   . Hyperlipidemia Mother   . Hypertension Mother   . Diabetes Father   . Heart disease Father        before age 56  . Hypertension Father   . Hyperlipidemia Father   . Hypertension Sister   . Diabetes Brother   . Glaucoma Paternal Grandfather     Social History Social History   Tobacco Use  . Smoking status: Former Smoker    Packs/day: 1.00    Years: 10.00    Pack years: 10.00    Types: Cigarettes    Quit date: 07/25/2008    Years since quitting: 11.7  . Smokeless tobacco: Never Used  . Tobacco comment: quit 6777yrs ago  Vaping Use  . Vaping Use: Never used  Substance Use Topics  . Alcohol use: No  . Drug use: No     Allergies   Amoxicillin-pot clavulanate, Codeine, Darvocet [propoxyphene n-acetaminophen], Oxycodone, Statins, and Propoxyphene   Review of Systems Review of Systems  Constitutional: Negative for fever.  Respiratory: Negative for shortness of breath.   Cardiovascular: Negative for chest pain.  Gastrointestinal: Positive for abdominal pain. Negative for diarrhea, nausea and vomiting.  Genitourinary: Positive for dysuria and frequency. Negative for flank pain, genital sores, hematuria, menstrual problem, vaginal bleeding, vaginal  discharge and vaginal pain.  Musculoskeletal: Negative for back pain.  Skin: Negative for rash.  Neurological: Negative for dizziness, light-headedness and headaches.     Physical Exam Triage Vital Signs ED Triage Vitals  Enc Vitals Group     BP      Pulse      Resp      Temp      Temp src      SpO2      Weight      Height      Head Circumference      Peak Flow      Pain Score      Pain Loc      Pain Edu?      Excl. in GC?    No data found.  Updated Vital Signs BP 100/65 (BP Location: Right Arm)   Pulse 87   Temp 98.4 F (36.9 C) (Oral)   Resp 16   LMP 03/05/2018   SpO2 97%   Visual Acuity Right Eye Distance:   Left Eye Distance:   Bilateral Distance:    Right Eye Near:   Left Eye Near:    Bilateral Near:     Physical Exam Vitals and nursing note reviewed.  Constitutional:      Appearance: She is well-developed and well-nourished.     Comments: No acute distress  HENT:     Head: Normocephalic and atraumatic.     Nose: Nose normal.  Eyes:     Conjunctiva/sclera: Conjunctivae normal.  Cardiovascular:     Rate and Rhythm: Normal rate.  Pulmonary:     Effort: Pulmonary effort is normal. No respiratory distress.  Abdominal:     General: There is no distension.  Musculoskeletal:        General: Normal range of motion.     Cervical back: Neck supple.  Skin:    General: Skin is warm and dry.  Neurological:     Mental Status: She is alert and oriented to person, place, and time.  Psychiatric:        Mood and Affect: Mood and affect normal.      UC Treatments / Results  Labs (all labs ordered are  listed, but only abnormal results are displayed) Labs Reviewed  POCT URINALYSIS DIP (MANUAL ENTRY) - Abnormal; Notable for the following components:      Result Value   Clarity, UA cloudy (*)    Glucose, UA =250 (*)    Spec Grav, UA >=1.030 (*)    Blood, UA trace-intact (*)    Protein Ur, POC trace (*)    Leukocytes, UA Small (1+) (*)    All other  components within normal limits  URINE CULTURE  CERVICOVAGINAL ANCILLARY ONLY    EKG   Radiology No results found.  Procedures Procedures (including critical care time)  Medications Ordered in UC Medications - No data to display  Initial Impression / Assessment and Plan / UC Course  I have reviewed the triage vital signs and the nursing notes.  Pertinent labs & imaging results that were available during my care of the patient were reviewed by me and considered in my medical decision making (see chart for details).     UA consistent with UTI, urine culture pending, treating with Bactrim twice daily x3 days along with empirically treating for yeast with Diflucan.  Vaginal swab pending to further evaluate for other vaginal infections contributing to symptoms.  Will alter therapy as needed.  Push fluids.  Discussed strict return precautions. Patient verbalized understanding and is agreeable with plan.  Final Clinical Impressions(s) / UC Diagnoses   Final diagnoses:  Lower urinary tract infection, acute     Discharge Instructions     Begin Bactrim twice daily for 3 days to treat UTI Drink plenty of fluids 1 tab of Diflucan today, repeat after completing Bactrim to treat yeast infection Vaginal swab and urine culture pending for more definitive results, we will call if abnormal and add medicines if needed  Please follow-up if any symptoms not improving or worsening    ED Prescriptions    Medication Sig Dispense Auth. Provider   sulfamethoxazole-trimethoprim (BACTRIM DS) 800-160 MG tablet Take 1 tablet by mouth 2 (two) times daily for 3 days. 6 tablet Oma Alpert C, PA-C   fluconazole (DIFLUCAN) 150 MG tablet Take 1 tablet (150 mg total) by mouth once for 1 dose. 2 tablet Eriq Hufford, [place] C, PA-C     PDMP not reviewed this encounter.   Lew Dawes, [place] 05/07/20 [phone]

## 2020-05-08 ENCOUNTER — Telehealth: Payer: Self-pay

## 2020-05-08 MED ORDER — SULFAMETHOXAZOLE-TRIMETHOPRIM 800-160 MG PO TABS: 1.0000 | ORAL_TABLET | Freq: Two times a day (BID) | ORAL | 0 refills | Status: AC

## 2020-05-08 NOTE — Telephone Encounter (Signed)
Pt called and stated that she lost her Bactrim pills. Per H. Wieters, PA, Rx re-sent to pharmacy.

## 2020-05-09 LAB — CERVICOVAGINAL ANCILLARY ONLY
Bacterial Vaginitis (gardnerella): NEGATIVE
Candida Glabrata: NEGATIVE
Candida Vaginitis: NEGATIVE
Chlamydia: NEGATIVE
Comment: NEGATIVE
Comment: NEGATIVE
Comment: NEGATIVE
Comment: NEGATIVE
Comment: NEGATIVE
Comment: NORMAL
Neisseria Gonorrhea: NEGATIVE
Trichomonas: NEGATIVE

## 2020-05-09 LAB — URINE CULTURE: Culture: >=100000 — AB

## 2020-05-09 NOTE — Telephone Encounter (Signed)
Prescription for Keflex based on culture results

## 2020-05-10 MED ORDER — FOSFOMYCIN TROMETHAMINE 3 G PO PACK
3.0000 g | PACK | Freq: Once | ORAL | 0 refills | Status: AC
Start: 2020-05-10 — End: 2020-05-10

## 2020-05-10 NOTE — Telephone Encounter (Signed)
Changed to prescription for Fosfomycin per Dr. Leonides Grills due to patient's allergies

## 2020-05-11 ENCOUNTER — Other Ambulatory Visit: Payer: Self-pay

## 2020-05-11 ENCOUNTER — Ambulatory Visit: Payer: 59 | Admitting: Podiatry

## 2020-05-11 ENCOUNTER — Ambulatory Visit (INDEPENDENT_AMBULATORY_CARE_PROVIDER_SITE_OTHER): Payer: 59

## 2020-05-11 DIAGNOSIS — M898X7 Other specified disorders of bone, ankle and foot: Secondary | ICD-10-CM | POA: Diagnosis not present

## 2020-05-11 DIAGNOSIS — L84 Corns and callosities: Secondary | ICD-10-CM

## 2020-05-11 NOTE — Progress Notes (Signed)
Visit  SUBJECTIVE Patient with a history of type 2 diabetes mellitus presents to office today complaining of pain and sensitivity associated to the hypertrophic bone to the left foot that is very painful with ambulation.  Patient has history of amputations previously.  She states that the bone is very sensitive and causing the callus to develop around the second MTPJ.  She presents for further treatment evaluation  Past Medical History:  Diagnosis Date  . Anemia   . Anxiety    takes xanax daily  . Anxiety disorder   . Cataract    Mixed form OU  . Complication of anesthesia   . Depression, major    takes wellbutrin and celexa daily  . Diabetes mellitus    type 2   . Diabetic retinopathy (HCC)    NPDR OU  . Fibromyalgia   . GERD (gastroesophageal reflux disease)    doesn't take any meds  . H/O hiatal hernia   . Hyperlipidemia   . Hypertension    takes Lisinopril and Diovan daily  . Hypertensive retinopathy    OU  . Hyperthyroidism    yrs ago but doesn't require meds for this  . Immune deficiency disorder (HCC)    pt states she has no clue what this is  . Insomnia   . Joint pain   . Joint swelling   . PTSD (post-traumatic stress disorder)   . PTSD (post-traumatic stress disorder)   . PVD (peripheral vascular disease) (HCC)   . Sleep apnea     OBJECTIVE General Patient is awake, alert, and oriented x 3 and in no acute distress. Derm Skin is dry and supple bilateral. Negative open lesions or macerations. Remaining integument unremarkable. Nails are tender, long, thickened and dystrophic with subungual debris, consistent with onychomycosis, digits 4-5 left and 1-3 right. No signs of infection noted.  Hyperkeratotic preulcerative callus lesion noted to the subsecond MTPJ left Vasc  DP and PT pedal pulses palpable bilaterally. Temperature gradient within normal limits.  Neuro Epicritic and protective threshold sensation diminished bilaterally.  Musculoskeletal Exam H/0 toe  amputation 1-3 left.  H/0 toe amputation 4-5 right. Radiographic exam the base of the proximal phalanx to the left second toe appears to be intact and in alignment with the second metatarsal head.  This coagulates clinically due to hypertrophy and enlargement of the bone in this area.  This area is symptomatic with palpation.  History of toe amputations 1-3 left. Otherwise no fracture identified.  Normal osseous mineralization.  Joint spaces preserved.  ASSESSMENT 1. Diabetes Mellitus w/ peripheral neuropathy 2. Onychomycosis of nail due to dermatophyte bilateral 3. Pain in foot bilateral 4. H/o toe amputation 1-3 left 5. H/o toe amputation 4-5 right  6. PVD BLE 7.  Preulcerative callus underlying the second metatarsal head of the left foot  PLAN OF CARE 1. Patient evaluated today. 2. Instructed to maintain good pedal hygiene and foot care. Stressed importance of controlling blood sugar.  3.  I explained to the patient that the callus is coming from the head of the second metatarsal.  The patient has been dealing with this painful symptomatic callus for several years now.  I do believe that second metatarsal head resection would help alleviate her pain.  The patient is getting new insurance in June/July and she would like to return to clinic after that insurance change to discuss surgery 4.  Today we discussed the conservative versus surgical management of the presenting pathology. The patient opts for surgical management. All  possible complications and details of the procedure were explained. All patient questions were answered. No guarantees were expressed or implied. 5. Authorization for surgery was initiated today. Surgery will consist of partial second ray amputation left foot.  Amputation will consist of the base of the proximal phalanx as well as the second metatarsal head. 6.  Vascular clearance needed prior to surgery 7.  Return to clinic 1 week postop   Recently had bariatric surgery  and lost 45 pounds.    Felecia Shelling, DPM [facility]  Dr. Felecia Shelling, DPM    [address] [place], [place] [address]                Office [phone]  Fax [phone]

## 2020-05-12 ENCOUNTER — Encounter: Payer: Self-pay | Admitting: Podiatry

## 2020-05-16 ENCOUNTER — Ambulatory Visit: Payer: 59 | Admitting: Family Medicine

## 2020-05-16 ENCOUNTER — Encounter: Payer: Self-pay | Admitting: Family Medicine

## 2020-05-16 VITALS — BP 166/105 | HR 84 | Ht 67.0 in | Wt 210.8 lb

## 2020-05-16 DIAGNOSIS — Z9989 Dependence on other enabling machines and devices: Secondary | ICD-10-CM | POA: Diagnosis not present

## 2020-05-16 DIAGNOSIS — G4733 Obstructive sleep apnea (adult) (pediatric): Secondary | ICD-10-CM | POA: Diagnosis not present

## 2020-05-16 NOTE — Patient Instructions (Signed)
Please continue using your CPAP regularly. While your insurance requires that you use CPAP at least 4 hours each night on 70% of the nights, I recommend, that you not skip any nights and use it throughout the night if you can. Getting used to CPAP and staying with the treatment long term does take time and patience and discipline. Untreated obstructive sleep apnea when it is moderate to severe can have an adverse impact on cardiovascular health and raise her risk for heart disease, arrhythmias, hypertension, congestive heart failure, stroke and diabetes. Untreated obstructive sleep apnea causes sleep disruption, nonrestorative sleep, and sleep deprivation. This can have an impact on your day to day functioning and cause daytime sleepiness and impairment of cognitive function, memory loss, mood disturbance, and problems focussing. Using CPAP regularly can improve these symptoms.   We will adjust pressure settings, today. I will increase maximum pressure to 13cmH20. Please continue to work on using CPAP nightly and greater than 4 hours each night. Follow up with me in 3 months.   Sleep Apnea Sleep apnea affects breathing during sleep. It causes breathing to stop for a short time or to become shallow. It can also increase the risk of:  Heart attack.  Stroke.  Being very overweight (obese).  Diabetes.  Heart failure.  Irregular heartbeat. The goal of treatment is to help you breathe normally again. What are the causes? There are three kinds of sleep apnea:  Obstructive sleep apnea. This is caused by a blocked or collapsed airway.  Central sleep apnea. This happens when the brain does not send the right signals to the muscles that control breathing.  Mixed sleep apnea. This is a combination of obstructive and central sleep apnea. The most common cause of this condition is a collapsed or blocked airway. This can happen if:  Your throat muscles are too relaxed.  Your tongue and tonsils are  too large.  You are overweight.  Your airway is too small.   What increases the risk?  Being overweight.  Smoking.  Having a small airway.  Being older.  Being female.  Drinking alcohol.  Taking medicines to calm yourself (sedatives or tranquilizers).  Having family members with the condition. What are the signs or symptoms?  Trouble staying asleep.  Being sleepy or tired during the day.  Getting angry a lot.  Loud snoring.  Headaches in the morning.  Not being able to focus your mind (concentrate).  Forgetting things.  Less interest in sex.  Mood swings.  Personality changes.  Feelings of sadness (depression).  Waking up a lot during the night to pee (urinate).  Dry mouth.  Sore throat. How is this diagnosed?  Your medical history.  A physical exam.  A test that is done when you are sleeping (sleep study). The test is most often done in a sleep lab but may also be done at home. How is this treated?  Sleeping on your side.  Using a medicine to get rid of mucus in your nose (decongestant).  Avoiding the use of alcohol, medicines to help you relax, or certain pain medicines (narcotics).  Losing weight, if needed.  Changing your diet.  Not smoking.  Using a machine to open your airway while you sleep, such as: ? An oral appliance. This is a mouthpiece that shifts your lower jaw forward. ? A CPAP device. This device blows air through a mask when you breathe out (exhale). ? An EPAP device. This has valves that you put in each  nostril. ? A BPAP device. This device blows air through a mask when you breathe in (inhale) and breathe out.  Having surgery if other treatments do not work. It is important to get treatment for sleep apnea. Without treatment, it can lead to:  High blood pressure.  Coronary artery disease.  In men, not being able to have an erection (impotence).  Reduced thinking ability.   Follow these instructions at  home: Lifestyle  Make changes that your doctor recommends.  Eat a healthy diet.  Lose weight if needed.  Avoid alcohol, medicines to help you relax, and some pain medicines.  Do not use any products that contain nicotine or tobacco, such as cigarettes, e-cigarettes, and chewing tobacco. If you need help quitting, ask your doctor. General instructions  Take over-the-counter and prescription medicines only as told by your doctor.  If you were given a machine to use while you sleep, use it only as told by your doctor.  If you are having surgery, make sure to tell your doctor you have sleep apnea. You may need to bring your device with you.  Keep all follow-up visits as told by your doctor. This is important. Contact a doctor if:  The machine that you were given to use during sleep bothers you or does not seem to be working.  You do not get better.  You get worse. Get help right away if:  Your chest hurts.  You have trouble breathing in enough air.  You have an uncomfortable feeling in your back, arms, or stomach.  You have trouble talking.  One side of your body feels weak.  A part of your face is hanging down. These symptoms may be an emergency. Do not wait to see if the symptoms will go away. Get medical help right away. Call your local emergency services (911 in the [place] Do not drive yourself to the hospital. Summary  This condition affects breathing during sleep.  The most common cause is a collapsed or blocked airway.  The goal of treatment is to help you breathe normally while you sleep. This information is not intended to replace advice given to you by your health care provider. Make sure you discuss any questions you have with your health care provider. Document Revised: 12/06/2017 Document Reviewed: 10/15/2017 Elsevier Patient Education  2021 [employer].

## 2020-05-16 NOTE — Progress Notes (Addendum)
PATIENT: Kelly Barber DOB: 10-26-1965  REASON FOR VISIT: follow up HISTORY FROM: patient  Chief Complaint  Patient presents with  . Follow-up    RM 1, alone. Last seen 12/24/19.      HISTORY OF PRESENT ILLNESS:  05/16/20 ALL: Kelly Barber returns today for follow-up of OSA on CPAP therapy.  She was last seen in October and wished to resume therapy.  She reports that she is using CPAP more frequently.  She expresses concern today as she is continuing to note snoring.  She states that she has a lot going on at home.  She has gained some weight.  She does not notice any improvement in how she feels using CPAP.  She is asking that I increase the maximum pressure setting, today.  She denies any concerns with supplies or her machine.  Blood pressures have been elevated at home.  She correlates this with increased stress.  She reports weight gain due to eating more.  She has not reached out to her primary care provider.  She just took her blood pressure medicine about 45 minutes ago.  She denies chest pain or shortness of breath.       12/24/2019 ALL:  Kelly Barber is a 55 y.o. female here today for follow up for OSA on CPAP.  She has not used her CPAP machine consistently since last being seen in December 2020.  She does wish to resume therapy as she notes improved sleep quality when using CPAP.  She denies any difficulty with her machine or the mask.    Compliance report dated 09/24/2019 through 12/22/2019 reveals that she used CPAP for of the past 90 days for compliance of 4%.  She used CPAP greater than 4 hours 1 day.  There was no leak noted.   HISTORY: (copied from [place] note on 02/25/2019)  Kelly Barber is a 55 year old female with a history of obstructive sleep apnea on CPAP.  His download indicates that he uses machine 30 out of 30 days for compliance of 100%.  She uses machine greater then 4 hours 15 days for compliance of 50%.  On average she uses her machine 3 hours  and 55 minutes.  Her residual AHI is 12.3 on 5-12 cm of water.  Her pressure in the 95th percentile is 11.8 with maximum pressure at 12.  She does not have a significant leak.  She reports that she is still trying to get used to using the CPAP machine.  She also states that she was recently diagnosed with a goiter and will be having surgery.  She returns today for an evaluation.  CT soft tissue neck with contrast: Diffuse enlargement of the thyroid gland, more on the left lobe than the right, without discernible focal nodule. Recent ultrasound showed a diffusely heterogeneous nature. The gland has enlarged further since the study of 2007. Cephalo caudal length of the right lobe at that time was 7.3 cm in the left lobe was 7.8 cm.  HISTORY 11/19/2018: She reports that she has lost quite a bit of weight, she estimates that she lost 60 to 70 pounds since her maximum weight, she had gastric bypass surgery on 09/02/2017. She wants to lose more weight, her goal is to be in the 160s. She has developed issues with her thyroid, goiter, she may be looking at thyroidectomy. She has an appointment with the endocrinologist next week, she believes it is [name]. She had difficulty tolerating the CPAP, and was  uncomfortable, she could not sleep and the position she wanted to, she took off the mask in the middle of the night. She lost coverage of her CPAP shortly before her bariatric surgery but does report she needed either additional oxygen or ventilatory support after her bariatric surgery as she recalls. She still snores.  The patient's allergies, current medications, family history, past medical history, past social history, past surgical history and problem list were reviewed and updated as appropriate.   REVIEW OF SYSTEMS: Out of a complete 14 system review of symptoms, the patient complains only of the following symptoms, fatigue and all other reviewed systems are negative.  ESS:  8    ALLERGIES: Allergies  Allergen Reactions  . Amoxicillin-Pot Clavulanate Shortness Of Breath    Has patient had a PCN reaction causing immediate rash, facial/tongue/throat swelling, SOB or lightheadedness with hypotension: Yes Has patient had a PCN reaction causing severe rash involving mucus membranes or skin necrosis: No Has patient had a PCN reaction that required hospitalization: Yes Has patient had a PCN reaction occurring within the last 10 years: No If all of the above answers are "NO", then may proceed with Cephalosporin use.   . Codeine Anaphylaxis  . Darvocet [Propoxyphene N-Acetaminophen] Anaphylaxis    Plain Tylenol ok  . Oxycodone Anaphylaxis  . Statins Other (See Comments)    Joint pain  . Propoxyphene Swelling    HOME MEDICATIONS: Outpatient Medications Prior to Visit  Medication Sig Dispense Refill  . acetaminophen (TYLENOL) 325 MG tablet Take 2 tablets (650 mg total) by mouth every 6 (six) hours as needed for mild pain (or Fever >/= 101).    Marland Kitchen. ALPRAZolam (XANAX) 0.5 MG tablet Take 0.5 mg by mouth 3 (three) times daily as needed.    . calcium carbonate (OS-CAL - DOSED IN MG OF ELEMENTAL CALCIUM) 1250 (500 Ca) MG tablet Take 2 tablets (1,000 mg of elemental calcium total) by mouth 3 (three) times daily with meals. 100 tablet 0  . desvenlafaxine (PRISTIQ) 50 MG 24 hr tablet 1 tablet    . DULoxetine (CYMBALTA) 60 MG capsule Take 120 mg by mouth at bedtime.    Marland Kitchen. levothyroxine (SYNTHROID) 88 MCG tablet Take 88 mcg by mouth every morning.    Marland Kitchen. losartan (COZAAR) 25 MG tablet Take 25 mg by mouth daily.    Marland Kitchen. OZEMPIC, 0.25 OR 0.5 MG/DOSE, 2 MG/1.5ML SOPN Inject 0.25 mg into the skin every Wednesday.     . traMADol (ULTRAM) 50 MG tablet Take 1 tablet (50 mg total) by mouth every 6 (six) hours as needed (mild pain). 10 tablet 0  . clonazePAM (KLONOPIN) 1 MG tablet Take 1 mg by mouth 3 (three) times daily.    . clopidogrel (PLAVIX) 75 MG tablet Take 75 mg by mouth every  evening.    . docusate sodium (COLACE) 100 MG capsule Take 1 capsule (100 mg total) by mouth every 12 (twelve) hours. 60 capsule 0   No facility-administered medications prior to visit.    PAST MEDICAL HISTORY: Past Medical History:  Diagnosis Date  . Anemia   . Anxiety    takes xanax daily  . Anxiety disorder   . Cataract    Mixed form OU  . Complication of anesthesia   . Depression, major    takes wellbutrin and celexa daily  . Diabetes mellitus    type 2   . Diabetic retinopathy (HCC)    NPDR OU  . Fibromyalgia   . GERD (gastroesophageal reflux disease)  doesn't take any meds  . H/O hiatal hernia   . Hyperlipidemia   . Hypertension    takes Lisinopril and Diovan daily  . Hypertensive retinopathy    OU  . Hyperthyroidism    yrs ago but doesn't require meds for this  . Immune deficiency disorder (HCC)    pt states she has no clue what this is  . Insomnia   . Joint pain   . Joint swelling   . PTSD (post-traumatic stress disorder)   . PTSD (post-traumatic stress disorder)   . PVD (peripheral vascular disease) (HCC)   . Sleep apnea     PAST SURGICAL HISTORY: Past Surgical History:  Procedure Laterality Date  . ABDOMINAL AORTAGRAM N/A 08/10/2011   Procedure: ABDOMINAL Ronny Flurry;  Surgeon: Sherren Kerns, MD;  Location[facility] CATH LAB;  Service: Cardiovascular;  Laterality: N/A;  . AMPUTATION     lft foot toes 1.2.3  . AMPUTATION  01/07/2012   Procedure: AMPUTATION RAY;  Surgeon: Sherren Kerns, MD;  Location: [facility] OR;  Service: Vascular;  Laterality: Right;  4TH & 5TH toes  . aortogram    . CHOLECYSTECTOMY  1990  . COLONOSCOPY    . DILATION AND CURETTAGE OF UTERUS    . FEMORAL BYPASS  04/29/2008   left  . FEMORAL-POPLITEAL BYPASS GRAFT  08/27/2011   Procedure: BYPASS GRAFT FEMORAL-POPLITEAL ARTERY;  Surgeon: Sherren Kerns, MD;  Location: [facility] OR;  Service: Vascular;  Laterality: Right;  . FEMORAL-POPLITEAL BYPASS GRAFT  08/29/2011   Procedure: BYPASS GRAFT  FEMORAL-POPLITEAL ARTERY;  Surgeon: Sherren Kerns, MD;  Location: [facility] OR;  Service: Vascular;  Laterality: Right;  Vein Patch Angionplasty and Thrombectomy   . GASTRIC ROUX-EN-Y N/A 09/02/2017   Procedure: LAPAROSCOPIC ROUX-EN-Y GASTRIC BYPASS WITH UPPER ENDOSCOPY AND ERAS PATHWAY;  Surgeon: Kinsinger, De Blanch, MD;  Location: [facility] ORS;  Service: General;  Laterality: N/A;  . INTRAOPERATIVE ARTERIOGRAM  08/29/2011   Procedure: INTRA OPERATIVE ARTERIOGRAM;  Surgeon: Sherren Kerns, MD;  Location: [facility] OR;  Service: Vascular;  Laterality: Right;  . LOWER EXTREMITY ANGIOGRAM N/A 08/10/2011   Procedure: LOWER EXTREMITY ANGIOGRAM;  Surgeon: Sherren Kerns, MD;  Location: [facility] CATH LAB;  Service: Cardiovascular;  Laterality: N/A;  . THYROIDECTOMY N/A 05/29/2019   Procedure: TOTAL THYROIDECTOMY;  Surgeon: Darnell Level, MD;  Location: [facility] ORS;  Service: General;  Laterality: N/A;  . TOE AMPUTATION  07/12/2008    left foot toes 1,2,3  . TUBAL LIGATION    . VASCULAR SURGERY      FAMILY HISTORY: Family History  Problem Relation Age of Onset  . Diabetes Mother   . Hyperlipidemia Mother   . Hypertension Mother   . Diabetes Father   . Heart disease Father        before age 106  . Hypertension Father   . Hyperlipidemia Father   . Hypertension Sister   . Diabetes Brother   . Glaucoma Paternal Grandfather     SOCIAL HISTORY: Social History   Socioeconomic History  . Marital status: Divorced    Spouse name: Not on file  . Number of children: Not on file  . Years of education: Not on file  . Highest education level: Not on file  Occupational History  . Not on file  Tobacco Use  . Smoking status: Former Smoker    Packs/day: 1.00    Years: 10.00    Pack years: 10.00    Types: Cigarettes    Quit date: 07/25/2008  Years since quitting: 11.8  . Smokeless tobacco: Never Used  . Tobacco comment: quit 71yrs ago  Vaping Use  . Vaping Use: Never used  Substance and Sexual Activity  . Alcohol use:  No  . Drug use: No  . Sexual activity: Not Currently  Other Topics Concern  . Not on file  Social History Narrative  . Not on file   Social Determinants of Health   Financial Resource Strain: Not on file  Food Insecurity: Not on file  Transportation Needs: Not on file  Physical Activity: Not on file  Stress: Not on file  Social Connections: Not on file  Intimate Partner Violence: Not on file     PHYSICAL EXAM  Vitals:   05/16/20 0701  BP: (!) 166/105  Pulse: 84  Weight: 210 lb 12.8 oz (95.6 kg)  Height:  (1.702 m)   Body mass index is 33.02 kg/m.  Generalized: Well developed, in no acute distress  Cardiology: normal rate and rhythm, no murmur noted Respiratory: clear to auscultation bilaterally  Neurological examination  Mentation: Alert oriented to time, place, history taking. Follows all commands speech and language fluent Cranial nerve II-XII: Pupils were equal round reactive to light. Extraocular movements were full, visual field were full  Motor: The motor testing reveals 5 over 5 strength of all 4 extremities. Good symmetric motor tone is noted throughout.  Gait and station: Gait is normal.    DIAGNOSTIC DATA (LABS, IMAGING, TESTING) - I reviewed patient records, labs, notes, testing and imaging myself where available.  No flowsheet data found.   Lab Results  Component Value Date   WBC 5.9 05/25/2019   HGB 12.5 05/25/2019   HCT 39.7 05/25/2019   MCV 95.0 05/25/2019   PLT 249 05/25/2019      Component Value Date/Time   NA 137 05/30/2019 0530   K 5.4 (H) 05/30/2019 0530   CL 101 05/30/2019 0530   CO2 26 05/30/2019 0530   GLUCOSE 232 (H) 05/30/2019 0530   BUN 28 (H) 05/30/2019 0530   CREATININE 1.12 (H) 05/30/2019 0530   CALCIUM 8.4 (L) 05/30/2019 0530   PROT 7.2 10/16/2017 1135   ALBUMIN 3.3 (L) 10/16/2017 1135   AST 19 10/16/2017 1135   ALT 22 10/16/2017 1135   ALKPHOS 78 10/16/2017 1135   BILITOT 1.1 10/16/2017 1135   GFRNONAA 56 (L)  05/30/2019 0530   GFRAA >60 05/30/2019 0530   Lab Results  Component Value Date   CHOL 197 06/16/2014   HDL 30 (L) 06/16/2014   LDLCALC 125 (H) 06/16/2014   TRIG 211 (H) 06/16/2014   CHOLHDL 6.6 06/16/2014   Lab Results  Component Value Date   HGBA1C 9.5 (H) 05/25/2019   No results found for: [id number] Lab Results  Component Value Date   TSH 0.605 02/23/2008     ASSESSMENT AND PLAN 55 y.o. year old female  has a past medical history of Anemia, Anxiety, Anxiety disorder, Cataract, Complication of anesthesia, Depression, major, Diabetes mellitus, Diabetic retinopathy (HCC), Fibromyalgia, GERD (gastroesophageal reflux disease), H/O hiatal hernia, Hyperlipidemia, Hypertension, Hypertensive retinopathy, Hyperthyroidism, Immune deficiency disorder (HCC), Insomnia, Joint pain, Joint swelling, PTSD (post-traumatic stress disorder), PTSD (post-traumatic stress disorder), PVD (peripheral vascular disease) (HCC), and Sleep apnea. here with     ICD-10-CM   1. OSA on CPAP  G47.33 For home use only DME continuous positive airway pressure (CPAP)   Z99.89      Kelly Barber continues to have concerns of snoring despite increased compliance.  She does not feel that she is getting enough air when using CPAP. Compliance report shows improving daily compliance, now at 73%. 4 hour usage remains sub optimal. I will increase maximum pressure to 13cmH20. Settings changed in [employer]. She was encouraged to continue using CPAP nightly and greater than 4 hours each night. Risks of untreated sleep apnea review and education materials provided. We have reviewed elevated BP. She contributes this to increased stress and weight gain. She was encouraged to reach out to her PCP if readings do not improve at home. Healthy lifestyle habits encouraged. She will follow up in 3 months, sooner if needed. She verbalizes understanding and agreement with this plan.    Orders Placed This Encounter  Procedures  . For home use  only DME continuous positive airway pressure (CPAP)    Increase maximum pressure to 13cmH20. Settings already changed in ResMed by NP.    Order Specific Question:   Length of Need    Answer:   Lifetime    Order Specific Question:   Patient has OSA or probable OSA    Answer:   Yes    Order Specific Question:   Is the patient currently using CPAP in the home    Answer:   Yes    Order Specific Question:   Settings    Answer:   Other see comments    Order Specific Question:   CPAP supplies needed    Answer:   Mask, headgear, cushions, filters, heated tubing and water chamber     No orders of the defined types were placed in this encounter.     I spent 15 minutes with the patient. 50% of this time was spent counseling and educating patient on plan of care and medications.    Shawnie Dapper, FNP-C 05/16/2020, 7:48 AM [facility] [address] [place], [place] [address] [phone]  I reviewed the above note and documentation by the Nurse Practitioner and agree with the history, exam, assessment and plan as outlined above. I was available for consultation. Huston Foley, MD, PhD [facility])

## 2020-05-16 NOTE — Progress Notes (Signed)
Epworth Sleepiness Scale 0= would never doze 1= slight chance of dozing 2= moderate chance of dozing 3= high chance of dozing  Sitting and reading: 2 Watching TV:1 Sitting inactive in a public place (ex. Theater or meeting):0 As a passenger in a car for an hour without a break:0 Lying down to rest in the afternoon:3 Sitting and talking to someone:0 Sitting quietly after lunch (no alcohol):2 In a car, while stopped in traffic:0 Total:8

## 2020-05-23 ENCOUNTER — Telehealth: Payer: Self-pay | Admitting: Urology

## 2020-05-23 NOTE — Progress Notes (Signed)
Cm sent to [facility] 

## 2020-05-23 NOTE — Telephone Encounter (Signed)
DOS: 06/09/20  AMP TOE MET/ W TOE 2ND LEFT --- 79987  [facility] EFFECTIVE DATE-  02/03/20  PLAN DEDUCTIBLE -  $2,000.00 W/  $228.28 REMAINING OUT OF POCKET - $6,000.00 W/ $3,348.17 REMAINING COPAY - $0.00 COINSURANCE - 20%   PER [facility] SITE CPT CODE 21587 HAS BEEN APPROVED, AUTH # [id number].

## 2020-06-09 ENCOUNTER — Encounter: Payer: Self-pay | Admitting: Podiatry

## 2020-06-09 ENCOUNTER — Other Ambulatory Visit: Payer: Self-pay | Admitting: Podiatry

## 2020-06-09 DIAGNOSIS — L84 Corns and callosities: Secondary | ICD-10-CM

## 2020-06-09 DIAGNOSIS — E0843 Diabetes mellitus due to underlying condition with diabetic autonomic (poly)neuropathy: Secondary | ICD-10-CM | POA: Diagnosis not present

## 2020-06-09 DIAGNOSIS — M898X7 Other specified disorders of bone, ankle and foot: Secondary | ICD-10-CM | POA: Diagnosis not present

## 2020-06-09 MED ORDER — DOXYCYCLINE HYCLATE 100 MG PO TABS: 100.0000 mg | ORAL_TABLET | Freq: Two times a day (BID) | ORAL | 0 refills | Status: DC

## 2020-06-09 MED ORDER — OXYCODONE-ACETAMINOPHEN 5-325 MG PO TABS: 1.0000 | ORAL_TABLET | ORAL | 0 refills | Status: DC | PRN

## 2020-06-09 NOTE — Progress Notes (Signed)
PRN postop 

## 2020-06-15 ENCOUNTER — Ambulatory Visit (INDEPENDENT_AMBULATORY_CARE_PROVIDER_SITE_OTHER): Payer: 59

## 2020-06-15 ENCOUNTER — Ambulatory Visit (INDEPENDENT_AMBULATORY_CARE_PROVIDER_SITE_OTHER): Payer: 59 | Admitting: Podiatry

## 2020-06-15 ENCOUNTER — Other Ambulatory Visit: Payer: Self-pay

## 2020-06-15 DIAGNOSIS — Z9889 Other specified postprocedural states: Secondary | ICD-10-CM

## 2020-06-15 DIAGNOSIS — M898X7 Other specified disorders of bone, ankle and foot: Secondary | ICD-10-CM

## 2020-06-15 NOTE — Progress Notes (Signed)
   Subjective:  Patient presents today status post partial second ray amputation left foot. DOS: 06/09/2020.  Patient states that she is doing very well.  She only had to take 2-3 of the pain pills.  She is kept the dressings clean dry and intact.  No new complaints at this time  Past Medical History:  Diagnosis Date  . Anemia   . Anxiety    takes xanax daily  . Anxiety disorder   . Cataract    Mixed form OU  . Complication of anesthesia   . Depression, major    takes wellbutrin and celexa daily  . Diabetes mellitus    type 2   . Diabetic retinopathy (HCC)    NPDR OU  . Fibromyalgia   . GERD (gastroesophageal reflux disease)    doesn't take any meds  . H/O hiatal hernia   . Hyperlipidemia   . Hypertension    takes Lisinopril and Diovan daily  . Hypertensive retinopathy    OU  . Hyperthyroidism    yrs ago but doesn't require meds for this  . Immune deficiency disorder (HCC)    pt states she has no clue what this is  . Insomnia   . Joint pain   . Joint swelling   . PTSD (post-traumatic stress disorder)   . PTSD (post-traumatic stress disorder)   . PVD (peripheral vascular disease) (HCC)   . Sleep apnea       Objective/Physical Exam Neurovascular status intact.  Skin incisions appear to be well coapted with sutures and staples intact. No sign of infectious process noted. No dehiscence. No active bleeding noted. Moderate edema noted to the surgical extremity.  Radiographic Exam:  Osteotomy of the second metatarsal appears to be stable with routine healing.  No gas within the tissues.  Assessment: 1. s/p remaining partial second ray amputation left. DOS: 06/09/2020   Plan of Care:  1. Patient was evaluated. X-rays reviewed 2.  Dressings changed today.  Keep clean dry and intact x1 week 3.  Continue minimal weightbearing in the postsurgical shoe 4.  Return to clinic in 1 week   Kelly Barber, DPM [facility]  Dr. Felecia Barber, DPM    [address] [place], [place] [address]                Office [phone]  Fax [phone]

## 2020-06-22 ENCOUNTER — Other Ambulatory Visit: Payer: Self-pay

## 2020-06-22 ENCOUNTER — Ambulatory Visit (INDEPENDENT_AMBULATORY_CARE_PROVIDER_SITE_OTHER): Payer: 59 | Admitting: Podiatry

## 2020-06-22 DIAGNOSIS — Z9889 Other specified postprocedural states: Secondary | ICD-10-CM

## 2020-06-22 DIAGNOSIS — M898X7 Other specified disorders of bone, ankle and foot: Secondary | ICD-10-CM

## 2020-06-22 NOTE — Progress Notes (Signed)
   Subjective:  Patient presents today status post partial second ray amputation left foot. DOS: 06/09/2020.  Patient states that she is doing very well.  She does admit to excessive walking this week.  She has been full activity and going up and down stairs.  She has no pain associated to the foot.  Past Medical History:  Diagnosis Date  . Anemia   . Anxiety    takes xanax daily  . Anxiety disorder   . Cataract    Mixed form OU  . Complication of anesthesia   . Depression, major    takes wellbutrin and celexa daily  . Diabetes mellitus    type 2   . Diabetic retinopathy (HCC)    NPDR OU  . Fibromyalgia   . GERD (gastroesophageal reflux disease)    doesn't take any meds  . H/O hiatal hernia   . Hyperlipidemia   . Hypertension    takes Lisinopril and Diovan daily  . Hypertensive retinopathy    OU  . Hyperthyroidism    yrs ago but doesn't require meds for this  . Immune deficiency disorder (HCC)    pt states she has no clue what this is  . Insomnia   . Joint pain   . Joint swelling   . PTSD (post-traumatic stress disorder)   . PTSD (post-traumatic stress disorder)   . PVD (peripheral vascular disease) (HCC)   . Sleep apnea       Objective/Physical Exam Neurovascular status intact.  Skin incisions appear to be well coapted with sutures and staples intact with exception of the central portion addition of the incision site which appears to have dehisced secondary to the patient's given history of excessive walking over the past week. No sign of infectious process noted. No dehiscence. No active bleeding noted. Moderate edema noted to the surgical extremity.  Assessment: 1. s/p remaining partial second ray amputation left. DOS: 06/09/2020   Plan of Care:  1. Patient was evaluated.  2.  Partial staples were removed from the incision site.  Some of the staples had actually broken due to the excessive walking 3.  Stressed the importance of reducing her activity. 4.  Continue  weightbearing in the postsurgical shoe 5.  Return to clinic in 2 weeks   Felecia Shelling, DPM [facility]  Dr. Felecia Shelling, DPM    [address] [place], [place] [address]                Office [phone]  Fax [phone]

## 2020-07-06 ENCOUNTER — Ambulatory Visit (INDEPENDENT_AMBULATORY_CARE_PROVIDER_SITE_OTHER): Payer: 59 | Admitting: Podiatry

## 2020-07-06 ENCOUNTER — Ambulatory Visit (INDEPENDENT_AMBULATORY_CARE_PROVIDER_SITE_OTHER): Payer: 59

## 2020-07-06 ENCOUNTER — Other Ambulatory Visit: Payer: Self-pay

## 2020-07-06 DIAGNOSIS — Z9889 Other specified postprocedural states: Secondary | ICD-10-CM

## 2020-07-06 MED ORDER — DOXYCYCLINE HYCLATE 100 MG PO TABS: 100.0000 mg | ORAL_TABLET | Freq: Two times a day (BID) | ORAL | 0 refills | Status: DC

## 2020-07-20 ENCOUNTER — Encounter: Payer: 59 | Admitting: Podiatry

## 2020-07-25 NOTE — Progress Notes (Signed)
   Subjective:  Patient presents today status post partial second ray amputation left foot. DOS: 06/09/2020.  Patient states that she is doing very well.  She does admit to excessive walking this week.  She has been full activity and going up and down stairs.  She has no pain associated to the foot.  Past Medical History:  Diagnosis Date  . Anemia   . Anxiety    takes xanax daily  . Anxiety disorder   . Cataract    Mixed form OU  . Complication of anesthesia   . Depression, major    takes wellbutrin and celexa daily  . Diabetes mellitus    type 2   . Diabetic retinopathy (HCC)    NPDR OU  . Fibromyalgia   . GERD (gastroesophageal reflux disease)    doesn't take any meds  . H/O hiatal hernia   . Hyperlipidemia   . Hypertension    takes Lisinopril and Diovan daily  . Hypertensive retinopathy    OU  . Hyperthyroidism    yrs ago but doesn't require meds for this  . Immune deficiency disorder (HCC)    pt states she has no clue what this is  . Insomnia   . Joint pain   . Joint swelling   . PTSD (post-traumatic stress disorder)   . PTSD (post-traumatic stress disorder)   . PVD (peripheral vascular disease) (HCC)   . Sleep apnea       Objective/Physical Exam Neurovascular status intact.  Skin incisions appear to be well coapted  with exception of the central portion addition of the incision site which appears to have dehisced secondary to the patient's given history of excessive walking over the past week.  The wound now measures approximately 1.0 x 0.5 x 0.2 cm.  There is some mild localized erythema around the dehiscence site.  No active bleeding noted.  Minimal edema noted to the surgical extremity.  Assessment: 1. s/p remaining partial second ray amputation left. DOS: 06/09/2020 2.  Ulcer/dehiscence surgical site   Plan of Care:  1. Patient was evaluated.  2.  Calcium alginate provided for the patient to apply to the area daily 3.  Doxycycline 100 mg 2 times daily #20 4.   Return to clinic in 2 weeks  Felecia Shelling, DPM [facility]  Dr. Felecia Shelling, DPM    [address] [place], [place] [address]                Office [phone]  Fax [phone]

## 2020-07-27 ENCOUNTER — Other Ambulatory Visit: Payer: Self-pay

## 2020-07-27 ENCOUNTER — Ambulatory Visit (INDEPENDENT_AMBULATORY_CARE_PROVIDER_SITE_OTHER): Payer: 59 | Admitting: Podiatry

## 2020-07-27 DIAGNOSIS — Z9889 Other specified postprocedural states: Secondary | ICD-10-CM

## 2020-07-27 NOTE — Progress Notes (Signed)
   Subjective:  Patient presents today status post partial second ray amputation left foot. DOS: 06/09/2020.  Patient states that she is doing very well.  She does admit to excessive walking this week.  She has been full activity and going up and down stairs.  She has no pain associated to the foot.  Past Medical History:  Diagnosis Date  . Anemia   . Anxiety    takes xanax daily  . Anxiety disorder   . Cataract    Mixed form OU  . Complication of anesthesia   . Depression, major    takes wellbutrin and celexa daily  . Diabetes mellitus    type 2   . Diabetic retinopathy (HCC)    NPDR OU  . Fibromyalgia   . GERD (gastroesophageal reflux disease)    doesn't take any meds  . H/O hiatal hernia   . Hyperlipidemia   . Hypertension    takes Lisinopril and Diovan daily  . Hypertensive retinopathy    OU  . Hyperthyroidism    yrs ago but doesn't require meds for this  . Immune deficiency disorder (HCC)    pt states she has no clue what this is  . Insomnia   . Joint pain   . Joint swelling   . PTSD (post-traumatic stress disorder)   . PTSD (post-traumatic stress disorder)   . PVD (peripheral vascular disease) (HCC)   . Sleep apnea         Objective/Physical Exam Neurovascular status intact.  Skin incisions appear to be well coapted  with exception of the central portion of the incision site which appears to have dehisce.  Ulcer now present.  The wound now measures approximately 1.0 x 0.5 x 0.2 cm.  There is some mild localized erythema around the dehiscence site.  No active bleeding noted.  Minimal edema noted to the surgical extremity.  Assessment: 1. s/p remaining partial second ray amputation left. DOS: 06/09/2020 2.  Ulcer/dehiscence surgical site   Plan of Care:  1. Patient was evaluated.  2.  Calcium alginate provided for the patient to apply to the area daily 3.  Return to clinic 4 weeks  Felecia Shelling, DPM [facility]  Dr. Felecia Shelling, DPM     [address] [place], [place] [address]                Office [phone]  Fax [phone]

## 2020-08-17 ENCOUNTER — Ambulatory Visit: Payer: 59 | Admitting: Family Medicine

## 2020-08-29 ENCOUNTER — Encounter: Payer: 59 | Admitting: Podiatry

## 2020-08-30 ENCOUNTER — Telehealth: Payer: Self-pay

## 2020-08-30 NOTE — Telephone Encounter (Signed)
Called to speak to pt about her cpap usage. pt was busy at the moment and informed me, she'd call back and r/s appt.

## 2020-08-31 ENCOUNTER — Encounter: Payer: Self-pay | Admitting: Family Medicine

## 2020-08-31 ENCOUNTER — Ambulatory Visit: Payer: 59 | Admitting: Family Medicine

## 2020-08-31 NOTE — Progress Notes (Deleted)
PATIENT: Kelly Barber DOB: 22-Jan-1966  REASON FOR VISIT: follow up HISTORY FROM: patient  No chief complaint on file.    HISTORY OF PRESENT ILLNESS: 08/31/2020 ALL: Kelly Barber returns for follow up for OSA on CPAP.   05/16/2020 ALL:  Kelly Barber returns today for follow-up of OSA on CPAP therapy.  She was last seen in October and wished to resume therapy.  She reports that she is using CPAP more frequently.  She expresses concern today as she is continuing to note snoring.  She states that she has a lot going on at home.  She has gained some weight.  She does not notice any improvement in how she feels using CPAP.  She is asking that I increase the maximum pressure setting, today.  She denies any concerns with supplies or her machine.  Blood pressures have been elevated at home.  She correlates this with increased stress.  She reports weight gain due to eating more.  She has not reached out to her primary care provider.  She just took her blood pressure medicine about 45 minutes ago.  She denies chest pain or shortness of breath.       12/24/2019 ALL:  Kelly RYTHER is a 55 y.o. female here today for follow up for OSA on CPAP.  She has not used her CPAP machine consistently since last being seen in December 2020.  She does wish to resume therapy as she notes improved sleep quality when using CPAP.  She denies any difficulty with her machine or the mask.    Compliance report dated 09/24/2019 through 12/22/2019 reveals that she used CPAP for of the past 90 days for compliance of 4%.  She used CPAP greater than 4 hours 1 day.  There was no leak noted.   HISTORY: (copied from [place] note on 02/25/2019)  Kelly Barber is a 55 year old female with a history of obstructive sleep apnea on CPAP.  His download indicates that he uses machine 30 out of 30 days for compliance of 100%.  She uses machine greater then 4 hours 15 days for compliance of 50%.  On average she uses her machine 3 hours  and 55 minutes.  Her residual AHI is 12.3 on 5-12 cm of water.  Her pressure in the 95th percentile is 11.8 with maximum pressure at 12.  She does not have a significant leak.  She reports that she is still trying to get used to using the CPAP machine.  She also states that she was recently diagnosed with a goiter and will be having surgery.  She returns today for an evaluation.   CT soft tissue neck with contrast: Diffuse enlargement of the thyroid gland, more on the left lobe than the right, without discernible focal nodule. Recent ultrasound showed a diffusely heterogeneous nature. The gland has enlarged further since the study of 2007. Cephalo caudal length of the right lobe at that time was 7.3 cm in the left lobe was 7.8 cm.   HISTORY 11/19/2018: She reports that she has lost quite a bit of weight, she estimates that she lost 60 to 70 pounds since her maximum weight, she had gastric bypass surgery on 09/02/2017.  She wants to lose more weight, her goal is to be in the 160s.  She has developed issues with her thyroid, goiter, she may be looking at thyroidectomy.  She has an appointment with the endocrinologist next week, she believes it is [name].  She had difficulty tolerating  the CPAP, and was uncomfortable, she could not sleep and the position she wanted to, she took off the mask in the middle of the night.  She lost coverage of her CPAP shortly before her bariatric surgery but does report she needed either additional oxygen or ventilatory support after her bariatric surgery as she recalls. She still snores.   The patient's allergies, current medications, family history, past medical history, past social history, past surgical history and problem list were reviewed and updated as appropriate.     REVIEW OF SYSTEMS: Out of a complete 14 system review of symptoms, the patient complains only of the following symptoms, fatigue and all other reviewed systems are negative.  ESS:  8    ALLERGIES: Allergies  Allergen Reactions   Amoxicillin-Pot Clavulanate Shortness Of Breath    Has patient had a PCN reaction causing immediate rash, facial/tongue/throat swelling, SOB or lightheadedness with hypotension: Yes Has patient had a PCN reaction causing severe rash involving mucus membranes or skin necrosis: No Has patient had a PCN reaction that required hospitalization: Yes Has patient had a PCN reaction occurring within the last 10 years: No If all of the above answers are "NO", then may proceed with Cephalosporin use.    Codeine Anaphylaxis   Darvocet [Propoxyphene N-Acetaminophen] Anaphylaxis    Plain Tylenol ok   Oxycodone Anaphylaxis   Statins Other (See Comments)    Joint pain   Propoxyphene Swelling    HOME MEDICATIONS: Outpatient Medications Prior to Visit  Medication Sig Dispense Refill   acetaminophen (TYLENOL) 325 MG tablet Take 2 tablets (650 mg total) by mouth every 6 (six) hours as needed for mild pain (or Fever >/= 101).     ALPRAZolam (XANAX) 0.5 MG tablet Take 0.5 mg by mouth 3 (three) times daily as needed.     ALPRAZolam (XANAX) 1 MG tablet Take by mouth.     calcium carbonate (OS-CAL - DOSED IN MG OF ELEMENTAL CALCIUM) 1250 (500 Ca) MG tablet Take 2 tablets (1,000 mg of elemental calcium total) by mouth 3 (three) times daily with meals. 100 tablet 0   calcium-vitamin D (OSCAL WITH D) 500-200 MG-UNIT TABS tablet Oyster Shell Calcium-Vitamin D3 500 mg-5 mcg (200 unit) tablet  TAKE 2 TABLETS BY MOUTH 3 TIMES DAILY WITH MEALS     clonazePAM (KLONOPIN) 0.5 MG tablet 1 tablet at bedtime.     desvenlafaxine (PRISTIQ) 50 MG 24 hr tablet 1 tablet     doxycycline (VIBRA-TABS) 100 MG tablet Take 1 tablet (100 mg total) by mouth 2 (two) times daily. 20 tablet 0   doxycycline (VIBRA-TABS) 100 MG tablet Take 1 tablet (100 mg total) by mouth 2 (two) times daily. 20 tablet 0   doxycycline (VIBRAMYCIN) 100 MG capsule Take 100 mg by mouth 2 (two) times daily.      DULoxetine (CYMBALTA) 60 MG capsule Take 120 mg by mouth at bedtime.     empagliflozin (JARDIANCE) 10 MG TABS tablet Take 1 tablet by mouth daily.     ergocalciferol (VITAMIN D2) 1.25 MG (50000 UT) capsule 1 capsule     levothyroxine (SYNTHROID) 150 MCG tablet Take 150 mcg by mouth every morning.     levothyroxine (SYNTHROID) 175 MCG tablet Take 175 mcg by mouth every morning.     levothyroxine (SYNTHROID) 88 MCG tablet Take 88 mcg by mouth every morning.     losartan (COZAAR) 100 MG tablet Take 1 tablet by mouth daily.     losartan (COZAAR) 25 MG tablet Take  25 mg by mouth daily.     metFORMIN (GLUCOPHAGE) 1000 MG tablet Take 1 tablet by mouth 2 (two) times daily.     metFORMIN (GLUCOPHAGE) 500 MG tablet 1 tablet with meals     nystatin-triamcinolone (MYCOLOG II) cream nystatin-triamcinolone 100,000 unit/g-0.1 % topical cream  APPLY TOPICALLY TO THE AFFECTED AREA(S) 2 TIMES PER DAY IN THE MORNING AND EVENING     oxyCODONE-acetaminophen (PERCOCET) 5-325 MG tablet Take 1 tablet by mouth every 4 (four) hours as needed for severe pain. 30 tablet 0   OZEMPIC, 0.25 OR 0.5 MG/DOSE, 2 MG/1.5ML SOPN Inject 0.25 mg into the skin every Wednesday.      phentermine (ADIPEX-P) 37.5 MG tablet Take 37.5 mg by mouth daily.     topiramate (TOPAMAX) 25 MG tablet Take 25 mg by mouth at bedtime.     traMADol (ULTRAM) 50 MG tablet Take 1 tablet (50 mg total) by mouth every 6 (six) hours as needed (mild pain). 10 tablet 0   traZODone (DESYREL) 50 MG tablet See admin instructions.     No facility-administered medications prior to visit.    PAST MEDICAL HISTORY: Past Medical History:  Diagnosis Date   Anemia    Anxiety    takes xanax daily   Anxiety disorder    Cataract    Mixed form OU   Complication of anesthesia    Depression, major    takes wellbutrin and celexa daily   Diabetes mellitus    type 2    Diabetic retinopathy (HCC)    NPDR OU   Fibromyalgia    GERD (gastroesophageal reflux disease)     doesn't take any meds   H/O hiatal hernia    Hyperlipidemia    Hypertension    takes Lisinopril and Diovan daily   Hypertensive retinopathy    OU   Hyperthyroidism    yrs ago but doesn't require meds for this   Immune deficiency disorder (HCC)    pt states she has no clue what this is   Insomnia    Joint pain    Joint swelling    PTSD (post-traumatic stress disorder)    PTSD (post-traumatic stress disorder)    PVD (peripheral vascular disease) (HCC)    Sleep apnea     PAST SURGICAL HISTORY: Past Surgical History:  Procedure Laterality Date   ABDOMINAL AORTAGRAM N/A 08/10/2011   Procedure: ABDOMINAL Ronny FlurryAORTAGRAM;  Surgeon: Sherren Kernsharles E Fields, MD;  [facility];  Service: Cardiovascular;  Laterality: N/A;   AMPUTATION     lft foot toes 1.2.3   AMPUTATION  01/07/2012   Procedure: AMPUTATION RAY;  Surgeon: Sherren Kernsharles E Fields, MD;  [facility];  Service: Vascular;  Laterality: Right;  4TH & 5TH toes   aortogram     CHOLECYSTECTOMY  1990   COLONOSCOPY     DILATION AND CURETTAGE OF UTERUS     FEMORAL BYPASS  04/29/2008   left   FEMORAL-POPLITEAL BYPASS GRAFT  08/27/2011   Procedure: BYPASS GRAFT FEMORAL-POPLITEAL ARTERY;  Surgeon: Sherren Kernsharles E Fields, MD;  [facility];  Service: Vascular;  Laterality: Right;   FEMORAL-POPLITEAL BYPASS GRAFT  08/29/2011   Procedure: BYPASS GRAFT FEMORAL-POPLITEAL ARTERY;  Surgeon: Sherren Kernsharles E Fields, MD;  [facility];  Service: Vascular;  Laterality: Right;  Vein Patch Angionplasty and Thrombectomy    GASTRIC ROUX-EN-Y N/A 09/02/2017   Procedure: LAPAROSCOPIC ROUX-EN-Y GASTRIC BYPASS WITH UPPER ENDOSCOPY AND ERAS PATHWAY;  Surgeon: Sheliah HatchKinsinger, De BlanchLuke Aaron, MD;  Location: [facility] ORS;  Service: General;  Laterality: N/A;   INTRAOPERATIVE ARTERIOGRAM  08/29/2011   Procedure: INTRA OPERATIVE ARTERIOGRAM;  Surgeon: Sherren Kerns, MD;  Location: [facility] OR;  Service: Vascular;  Laterality: Right;   LOWER EXTREMITY ANGIOGRAM N/A 08/10/2011   Procedure: LOWER  EXTREMITY ANGIOGRAM;  Surgeon: Sherren Kerns, MD;  Location: [facility] CATH LAB;  Service: Cardiovascular;  Laterality: N/A;   THYROIDECTOMY N/A 05/29/2019   Procedure: TOTAL THYROIDECTOMY;  Surgeon: Darnell Level, MD;  Location: [facility] ORS;  Service: General;  Laterality: N/A;   TOE AMPUTATION  07/12/2008    left foot toes 1,2,3   TUBAL LIGATION     VASCULAR SURGERY      FAMILY HISTORY: Family History  Problem Relation Age of Onset   Diabetes Mother    Hyperlipidemia Mother    Hypertension Mother    Diabetes Father    Heart disease Father        before age 77   Hypertension Father    Hyperlipidemia Father    Hypertension Sister    Diabetes Brother    Glaucoma Paternal Grandfather     SOCIAL HISTORY: Social History   Socioeconomic History   Marital status: Divorced    Spouse name: Not on file   Number of children: Not on file   Years of education: Not on file   Highest education level: Not on file  Occupational History   Not on file  Tobacco Use   Smoking status: Former    Packs/day: 1.00    Years: 10.00    Pack years: 10.00    Types: Cigarettes    Quit date: 07/25/2008    Years since quitting: 12.1   Smokeless tobacco: Never   Tobacco comments:    quit 48yrs ago  Vaping Use   Vaping Use: Never used  Substance and Sexual Activity   Alcohol use: No   Drug use: No   Sexual activity: Not Currently  Other Topics Concern   Not on file  Social History Narrative   Not on file   Social Determinants of Health   Financial Resource Strain: Not on file  Food Insecurity: Not on file  Transportation Needs: Not on file  Physical Activity: Not on file  Stress: Not on file  Social Connections: Not on file  Intimate Partner Violence: Not on file     PHYSICAL EXAM  There were no vitals filed for this visit.  There is no height or weight on file to calculate BMI.  Generalized: Well developed, in no acute distress  Cardiology: normal rate and rhythm, no murmur  noted Respiratory: clear to auscultation bilaterally  Neurological examination  Mentation: Alert oriented to time, place, history taking. Follows all commands speech and language fluent Cranial nerve II-XII: Pupils were equal round reactive to light. Extraocular movements were full, visual field were full  Motor: The motor testing reveals 5 over 5 strength of all 4 extremities. Good symmetric motor tone is noted throughout.  Gait and station: Gait is normal.    DIAGNOSTIC DATA (LABS, IMAGING, TESTING) - I reviewed patient records, labs, notes, testing and imaging myself where available.  No flowsheet data found.   Lab Results  Component Value Date   WBC 5.9 05/25/2019   HGB 12.5 05/25/2019   HCT 39.7 05/25/2019   MCV 95.0 05/25/2019   PLT 249 05/25/2019      Component Value Date/Time   NA 137 05/30/2019 0530   K 5.4 (H) 05/30/2019 0530   CL 101 05/30/2019 0530  CO2 26 05/30/2019 0530   GLUCOSE 232 (H) 05/30/2019 0530   BUN 28 (H) 05/30/2019 0530   CREATININE 1.12 (H) 05/30/2019 0530   CALCIUM 8.4 (L) 05/30/2019 0530   PROT 7.2 10/16/2017 1135   ALBUMIN 3.3 (L) 10/16/2017 1135   AST 19 10/16/2017 1135   ALT 22 10/16/2017 1135   ALKPHOS 78 10/16/2017 1135   BILITOT 1.1 10/16/2017 1135   GFRNONAA 56 (L) 05/30/2019 0530   GFRAA >60 05/30/2019 0530   Lab Results  Component Value Date   CHOL 197 06/16/2014   HDL 30 (L) 06/16/2014   LDLCALC 125 (H) 06/16/2014   TRIG 211 (H) 06/16/2014   CHOLHDL 6.6 06/16/2014   Lab Results  Component Value Date   HGBA1C 9.5 (H) 05/25/2019   No results found for: VITAMINB12 Lab Results  Component Value Date   TSH 0.605 02/23/2008     ASSESSMENT AND PLAN 55 y.o. year old female  has a past medical history of Anemia, Anxiety, Anxiety disorder, Cataract, Complication of anesthesia, Depression, major, Diabetes mellitus, Diabetic retinopathy (HCC), Fibromyalgia, GERD (gastroesophageal reflux disease), H/O hiatal hernia,  Hyperlipidemia, Hypertension, Hypertensive retinopathy, Hyperthyroidism, Immune deficiency disorder (HCC), Insomnia, Joint pain, Joint swelling, PTSD (post-traumatic stress disorder), PTSD (post-traumatic stress disorder), PVD (peripheral vascular disease) (HCC), and Sleep apnea. here with   No diagnosis found.    ENZA SHONE continues to have concerns of snoring despite increased compliance. She does not feel that she is getting enough air when using CPAP. Compliance report shows improving daily compliance, now at 73%. 4 hour usage remains sub optimal. I will increase maximum pressure to 13cmH20. Settings changed in [employer]. She was encouraged to continue using CPAP nightly and greater than 4 hours each night. Risks of untreated sleep apnea review and education materials provided. We have reviewed elevated BP. She contributes this to increased stress and weight gain. She was encouraged to reach out to her PCP if readings do not improve at home. Healthy lifestyle habits encouraged. She will follow up in 3 months, sooner if needed. She verbalizes understanding and agreement with this plan.    No orders of the defined types were placed in this encounter.    No orders of the defined types were placed in this encounter.      Shawnie Dapper, FNP-C 08/31/2020, 10:00 AM [facility] [address] [place], [place] [address] [phone]

## 2020-12-29 ENCOUNTER — Ambulatory Visit
Admission: EM | Admit: 2020-12-29 | Discharge: 2020-12-29 | Disposition: A | Discharge status: Home / Self Care | Payer: 59 | Attending: Internal Medicine | Admitting: Internal Medicine

## 2020-12-29 ENCOUNTER — Encounter: Payer: Self-pay | Admitting: Emergency Medicine

## 2020-12-29 DIAGNOSIS — B372 Candidiasis of skin and nail: Secondary | ICD-10-CM

## 2020-12-29 DIAGNOSIS — Z113 Encounter for screening for infections with a predominantly sexual mode of transmission: Secondary | ICD-10-CM | POA: Diagnosis not present

## 2020-12-29 MED ORDER — NYSTATIN 100000 UNIT/GM EX POWD: 1.0000 "application " | Freq: Three times a day (TID) | CUTANEOUS | 0 refills | Status: DC

## 2020-12-29 MED ORDER — FLUCONAZOLE 150 MG PO TABS: 150.0000 mg | ORAL_TABLET | Freq: Every day | ORAL | 0 refills | Status: DC

## 2020-12-29 NOTE — ED Provider Notes (Signed)
[name]    CSN: [id number] Arrival date & time: 12/29/20  1246      History   Chief Complaint Chief Complaint  Patient presents with   Vaginal Itching    HPI Kelly Barber is a 55 y.o. female.   Patient presents with vaginal itching that has been present for the past 2 weeks.  Patient reports that this has occurred before, and she was prescribed nystatin cream that resulted in improvement.  Patient denies any vaginal discharge, irregular vaginal bleeding, urinary burning, urinary frequency, pelvic pain, fever, back pain, abdominal pain.  Denies any known exposure to STD.  Also, patient does report that she has had recent unprotected sex with a new sexual partner in the last month.  Patient does have a history of genital herpes but denies any outbreaks or lesions present.   Vaginal Itching   Past Medical History:  Diagnosis Date   Anemia    Anxiety    takes xanax daily   Anxiety disorder    Cataract    Mixed form OU   Complication of anesthesia    Depression, major    takes wellbutrin and celexa daily   Diabetes mellitus    type 2    Diabetic retinopathy (HCC)    NPDR OU   Fibromyalgia    GERD (gastroesophageal reflux disease)    doesn't take any meds   H/O hiatal hernia    Hyperlipidemia    Hypertension    takes Lisinopril and Diovan daily   Hypertensive retinopathy    OU   Hyperthyroidism    yrs ago but doesn't require meds for this   Immune deficiency disorder (HCC)    pt states she has no clue what this is   Insomnia    Joint pain    Joint swelling    PTSD (post-traumatic stress disorder)    PTSD (post-traumatic stress disorder)    PVD (peripheral vascular disease) (HCC)    Sleep apnea     Patient Active Problem List   Diagnosis Date Noted   Substernal thyroid goiter 05/26/2019   Subclinical hyperthyroidism 05/26/2019   Morbid obesity (HCC) 09/02/2017   Glaucoma 06/15/2014   S/P amputation of foot (HCC) 06/14/2014    Depression 06/14/2014   Diabetic neuropathy (HCC) 06/14/2014   Aftercare following surgery of the circulatory system, NEC 12/20/2011   Atherosclerosis of native arteries of the extremities with ulceration(440.23) 12/20/2011   Preop cardiovascular exam 08/16/2011   Atherosclerosis of native arteries of the extremities with intermittent claudication 07/26/2011   CELLULITIS AND ABSCESS OF TRUNK 08/09/2008   LOWER LIMB AMPUTATION, OTHER TOE 08/03/2008   PERIPHERAL VASCULAR DISEASE 04/29/2008   LEUKOCYTOCLASTIC VASCULITIS 04/28/2008   VASCULAR PURPURA 04/21/2008   GERD 02/23/2008   GOITER, MULTINODULAR 12/19/2006   Diabetes (HCC) 12/19/2006   ANXIETY 12/19/2006   DEPRESSION 12/19/2006   Essential hypertension 12/19/2006   GALLSTONES 12/19/2006   FIBROMYALGIA 12/19/2006   WEIGHT GAIN 12/19/2006   CHEST PAIN, NON-CARDIAC 12/19/2006    Past Surgical History:  Procedure Laterality Date   ABDOMINAL AORTAGRAM N/A 08/10/2011   Procedure: ABDOMINAL Ronny Flurry;  Surgeon: Sherren Kerns, MD;  Location: [facility] CATH LAB;  Service: Cardiovascular;  Laterality: N/A;   AMPUTATION     lft foot toes 1.2.3   AMPUTATION  01/07/2012   Procedure: AMPUTATION RAY;  Surgeon: Sherren Kerns, MD;  Location[facility] OR;  Service: Vascular;  Laterality: Right;  4TH & 5TH toes   aortogram  CHOLECYSTECTOMY  1990   COLONOSCOPY     DILATION AND CURETTAGE OF UTERUS     FEMORAL BYPASS  04/29/2008   left   FEMORAL-POPLITEAL BYPASS GRAFT  08/27/2011   Procedure: BYPASS GRAFT FEMORAL-POPLITEAL ARTERY;  Surgeon: Sherren Kerns, MD;  Location: [facility] OR;  Service: Vascular;  Laterality: Right;   FEMORAL-POPLITEAL BYPASS GRAFT  08/29/2011   Procedure: BYPASS GRAFT FEMORAL-POPLITEAL ARTERY;  Surgeon: Sherren Kerns, MD;  Location: [facility] OR;  Service: Vascular;  Laterality: Right;  Vein Patch Angionplasty and Thrombectomy    GASTRIC ROUX-EN-Y N/A 09/02/2017   Procedure: LAPAROSCOPIC ROUX-EN-Y GASTRIC BYPASS WITH UPPER ENDOSCOPY AND  ERAS PATHWAY;  Surgeon: Sheliah Hatch De Blanch, MD;  Location: [facility] ORS;  Service: General;  Laterality: N/A;   INTRAOPERATIVE ARTERIOGRAM  08/29/2011   Procedure: INTRA OPERATIVE ARTERIOGRAM;  Surgeon: Sherren Kerns, MD;  Location: [facility] OR;  Service: Vascular;  Laterality: Right;   LOWER EXTREMITY ANGIOGRAM N/A 08/10/2011   Procedure: LOWER EXTREMITY ANGIOGRAM;  Surgeon: Sherren Kerns, MD;  Location: [facility] CATH LAB;  Service: Cardiovascular;  Laterality: N/A;   THYROIDECTOMY N/A 05/29/2019   Procedure: TOTAL THYROIDECTOMY;  Surgeon: Darnell Level, MD;  Location: [facility] ORS;  Service: General;  Laterality: N/A;   TOE AMPUTATION  07/12/2008    left foot toes 1,2,3   TUBAL LIGATION     VASCULAR SURGERY      OB History   No obstetric history on file.      Home Medications    Prior to Admission medications   Medication Sig Start Date End Date Taking? Authorizing Provider  fluconazole (DIFLUCAN) 150 MG tablet Take 1 tablet (150 mg total) by mouth daily. 12/29/20  Yes Juliett Eastburn, Rolly Salter E, FNP  nystatin powder Apply 1 application topically 3 (three) times daily. 12/29/20  Yes Dannia Snook, Acie Fredrickson, FNP  acetaminophen (TYLENOL) 325 MG tablet Take 2 tablets (650 mg total) by mouth every 6 (six) hours as needed for mild pain (or Fever >/= 101). 05/30/19   Berna Bue, MD  ALPRAZolam Prudy Feeler) 0.5 MG tablet Take 0.5 mg by mouth 3 (three) times daily as needed. 07/07/19   [provider]  ALPRAZolam Prudy Feeler) 1 MG tablet Take by mouth. 05/24/20   [provider]  calcium carbonate (OS-CAL - DOSED IN MG OF ELEMENTAL CALCIUM) 1250 (500 Ca) MG tablet Take 2 tablets (1,000 mg of elemental calcium total) by mouth 3 (three) times daily with meals. 05/30/19   Berna Bue, MD  calcium-vitamin D (OSCAL WITH D) 500-200 MG-UNIT TABS tablet [place] Calcium-Vitamin D3 500 mg-5 mcg (200 unit) tablet  TAKE 2 TABLETS BY MOUTH 3 TIMES DAILY WITH MEALS    [provider]  clonazePAM (KLONOPIN) 0.5 MG  tablet 1 tablet at bedtime.    [provider]  desvenlafaxine (PRISTIQ) 50 MG 24 hr tablet 1 tablet    [provider]  doxycycline (VIBRA-TABS) 100 MG tablet Take 1 tablet (100 mg total) by mouth 2 (two) times daily. 06/09/20   Felecia Shelling, DPM  doxycycline (VIBRA-TABS) 100 MG tablet Take 1 tablet (100 mg total) by mouth 2 (two) times daily. 07/06/20   Felecia Shelling, DPM  doxycycline (VIBRAMYCIN) 100 MG capsule Take 100 mg by mouth 2 (two) times daily. 12/28/19   [provider]  DULoxetine (CYMBALTA) 60 MG capsule Take 120 mg by mouth at bedtime.    [provider]  empagliflozin (JARDIANCE) 10 MG TABS tablet Take 1 tablet by mouth daily.  [provider]  ergocalciferol (VITAMIN D2) 1.25 MG (50000 UT) capsule 1 capsule    [provider]  levothyroxine (SYNTHROID) 150 MCG tablet Take 150 mcg by mouth every morning. 06/16/20   [provider]  levothyroxine (SYNTHROID) 175 MCG tablet Take 175 mcg by mouth every morning. 01/22/20   [provider]  levothyroxine (SYNTHROID) 88 MCG tablet Take 88 mcg by mouth every morning. 06/29/19   [provider]  losartan (COZAAR) 100 MG tablet Take 1 tablet by mouth daily. 05/05/20   [provider]  losartan (COZAAR) 25 MG tablet Take 25 mg by mouth daily. 07/07/19   [provider]  metFORMIN (GLUCOPHAGE) 1000 MG tablet Take 1 tablet by mouth 2 (two) times daily. 06/20/20   [provider]  metFORMIN (GLUCOPHAGE) 500 MG tablet 1 tablet with meals    [provider]  nystatin-triamcinolone (MYCOLOG II) cream nystatin-triamcinolone 100,000 unit/g-0.1 % topical cream  APPLY TOPICALLY TO THE AFFECTED AREA(S) 2 TIMES PER DAY IN THE MORNING AND EVENING    [provider]  oxyCODONE-acetaminophen (PERCOCET) 5-325 MG tablet Take 1 tablet by mouth every 4 (four) hours as needed for severe pain. 06/09/20   Felecia Shelling, DPM  OZEMPIC, 0.25 OR 0.5  MG/DOSE, 2 MG/1.5ML SOPN Inject 0.25 mg into the skin every Wednesday.  03/13/19   [provider]  phentermine (ADIPEX-P) 37.5 MG tablet Take 37.5 mg by mouth daily. 05/20/20   [provider]  topiramate (TOPAMAX) 25 MG tablet Take 25 mg by mouth at bedtime. 06/20/20   [provider]  traMADol (ULTRAM) 50 MG tablet Take 1 tablet (50 mg total) by mouth every 6 (six) hours as needed (mild pain). 05/30/19   Berna Bue, MD  traZODone (DESYREL) 50 MG tablet See admin instructions.    [provider]    Family History Family History  Problem Relation Age of Onset   Diabetes Mother    Hyperlipidemia Mother    Hypertension Mother    Diabetes Father    Heart disease Father        before age 68   Hypertension Father    Hyperlipidemia Father    Hypertension Sister    Diabetes Brother    Glaucoma Paternal Grandfather     Social History Social History   Tobacco Use   Smoking status: Former    Packs/day: 1.00    Years: 10.00    Pack years: 10.00    Types: Cigarettes    Quit date: 07/25/2008    Years since quitting: 12.4   Smokeless tobacco: Never   Tobacco comments:    quit 46yrs ago  Vaping Use   Vaping Use: Never used  Substance Use Topics   Alcohol use: No   Drug use: No     Allergies   Amoxicillin-pot clavulanate, Codeine, Darvocet [propoxyphene n-acetaminophen], Oxycodone, Statins, and Propoxyphene   Review of Systems Review of Systems Per HPI  Physical Exam Triage Vital Signs ED Triage Vitals  Enc Vitals Group     BP 12/29/20 1405 (!) 155/93     Pulse Rate 12/29/20 1405 75     Resp 12/29/20 1405 16     Temp 12/29/20 1405 98.1 F (36.7 C)     Temp Source 12/29/20 1405 Oral     SpO2 12/29/20 1405 97 %     Weight --      Height --      Head Circumference --      Peak  Flow --      Pain Score 12/29/20 1406 0     Pain Loc --      Pain Edu? --      Excl. in GC? --    No data found.  Updated Vital Signs BP (!) 155/93  (BP Location: Left Arm)   Pulse 75   Temp 98.1 F (36.7 C) (Oral)   Resp 16   LMP 03/05/2018   SpO2 97%   Visual Acuity Right Eye Distance:   Left Eye Distance:   Bilateral Distance:    Right Eye Near:   Left Eye Near:    Bilateral Near:     Physical Exam Exam conducted with a chaperone present.  Constitutional:      General: She is not in acute distress.    Appearance: Normal appearance. She is not toxic-appearing or diaphoretic.  HENT:     Head: Normocephalic and atraumatic.  Eyes:     Extraocular Movements: Extraocular movements intact.     Conjunctiva/sclera: Conjunctivae normal.  Pulmonary:     Effort: Pulmonary effort is normal.  Genitourinary:    Pubic Area: No rash.      Labia:        Right: No rash, tenderness, lesion or injury.        Left: No rash, tenderness, lesion or injury.      Comments: Slight erythema and irritation noted to inner thighs. Neurological:     General: No focal deficit present.     Mental Status: She is alert and oriented to person, place, and time. Mental status is at baseline.  Psychiatric:        Mood and Affect: Mood normal.        Behavior: Behavior normal.        Thought Content: Thought content normal.        Judgment: Judgment normal.     UC Treatments / Results  Labs (all labs ordered are listed, but only abnormal results are displayed) Labs Reviewed  CERVICOVAGINAL ANCILLARY ONLY    EKG   Radiology No results found.  Procedures Procedures (including critical care time)  Medications Ordered in UC Medications - No data to display  Initial Impression / Assessment and Plan / UC Course  I have reviewed the triage vital signs and the nursing notes.  Pertinent labs & imaging results that were available during my care of the patient were reviewed by me and considered in my medical decision making (see chart for details).     Patient's physical exam is consistent with candidiasis of the skin.  Will treat with  nystatin.  Diflucan also prescribed.  Vaginal swab pending due to patient having a new sexual partner and to rule out other worrisome etiologies.  No red flags seen on exam.Discussed strict return precautions. Patient verbalized understanding and is agreeable with plan.  Final Clinical Impressions(s) / UC Diagnoses   Final diagnoses:  Candidiasis, cutaneous  Screening examination for venereal disease     Discharge Instructions      You have been prescribed a nystatin powder to apply to the inner thighs.  You have also been prescribed Diflucan which is a pill that will treat yeast infection of the skin in the vagina.  Your vaginal swab is pending.  We will call if they are positive.     ED Prescriptions     Medication Sig Dispense Auth. Provider   fluconazole (DIFLUCAN) 150 MG tablet Take 1 tablet (150 mg total) by mouth daily.  1 tablet [place], [place], [place]   nystatin powder Apply 1 application topically 3 (three) times daily. 15 g Gustavus Bryant, [place]      PDMP not reviewed this encounter.   Gustavus Bryant, [place] 12/29/20 [phone]

## 2020-12-29 NOTE — ED Triage Notes (Signed)
States she's been experiencing severe genital itching for the last two weeks. Was seen here recently and prescribed niastatin cream without improvement, has been using OTC yeast infection treatment without improvement. States the itching ranges from suprapubic area, through vulva, into anal area. States it feels "bumpy, chaffed." Noticed small patches near anal area. Denies pain. Reports that she has had recent unprotected sex with a new sexual partner over the last month. Hx of herpes.

## 2020-12-29 NOTE — Discharge Instructions (Signed)
You have been prescribed a nystatin powder to apply to the inner thighs.  You have also been prescribed Diflucan which is a pill that will treat yeast infection of the skin in the vagina.  Your vaginal swab is pending.  We will call if they are positive.

## 2020-12-30 LAB — CERVICOVAGINAL ANCILLARY ONLY
Bacterial Vaginitis (gardnerella): NEGATIVE
Candida Glabrata: NEGATIVE
Candida Vaginitis: NEGATIVE
Chlamydia: NEGATIVE
Comment: NEGATIVE
Comment: NEGATIVE
Comment: NEGATIVE
Comment: NEGATIVE
Comment: NEGATIVE
Comment: NORMAL
Neisseria Gonorrhea: NEGATIVE
Trichomonas: NEGATIVE

## 2021-03-02 ENCOUNTER — Other Ambulatory Visit: Payer: Self-pay | Admitting: *Deleted

## 2021-03-02 DIAGNOSIS — I739 Peripheral vascular disease, unspecified: Secondary | ICD-10-CM

## 2021-03-16 ENCOUNTER — Ambulatory Visit: Payer: Medicaid Other

## 2021-03-16 ENCOUNTER — Inpatient Hospital Stay (HOSPITAL_COMMUNITY): Admission: RE | Admit: 2021-03-16 | Payer: 59 | Source: Ambulatory Visit

## 2021-03-28 ENCOUNTER — Encounter (HOSPITAL_COMMUNITY): Payer: Self-pay | Admitting: *Deleted

## 2021-04-20 DIAGNOSIS — Z1329 Encounter for screening for other suspected endocrine disorder: Secondary | ICD-10-CM | POA: Insufficient documentation

## 2021-07-03 ENCOUNTER — Ambulatory Visit (INDEPENDENT_AMBULATORY_CARE_PROVIDER_SITE_OTHER): Payer: Managed Care, Other (non HMO)

## 2021-07-03 ENCOUNTER — Ambulatory Visit: Payer: Managed Care, Other (non HMO) | Admitting: Podiatry

## 2021-07-03 DIAGNOSIS — S90852A Superficial foreign body, left foot, initial encounter: Secondary | ICD-10-CM | POA: Diagnosis not present

## 2021-07-03 DIAGNOSIS — R52 Pain, unspecified: Secondary | ICD-10-CM

## 2021-07-03 NOTE — Progress Notes (Signed)
? ?Subjective:  ?Patient presents today for new complaint of pain regarding left plantar foot pain.  Patient admits to going around barefoot at her house.  She says that last night she noticed pain and tenderness to the plantar aspect of the left foot.  She believes that perhaps she stepped on something.  She has not done anything for treatment currently.  She does have history of prior amputations. ? ?Past Medical History:  ?Diagnosis Date  ? Anemia   ? Anxiety   ? takes xanax daily  ? Anxiety disorder   ? Cataract   ? Mixed form OU  ? Complication of anesthesia   ? Depression, major   ? takes wellbutrin and celexa daily  ? Diabetes mellitus   ? type 2   ? Diabetic retinopathy (HCC)   ? NPDR OU  ? Fibromyalgia   ? GERD (gastroesophageal reflux disease)   ? doesn't take any meds  ? H/O hiatal hernia   ? Hyperlipidemia   ? Hypertension   ? takes Lisinopril and Diovan daily  ? Hypertensive retinopathy   ? OU  ? Hyperthyroidism   ? yrs ago but doesn't require meds for this  ? Immune deficiency disorder (HCC)   ? pt states she has no clue what this is  ? Insomnia   ? Joint pain   ? Joint swelling   ? PTSD (post-traumatic stress disorder)   ? PTSD (post-traumatic stress disorder)   ? PVD (peripheral vascular disease) (HCC)   ? Sleep apnea   ? ?Past Surgical History:  ?Procedure Laterality Date  ? ABDOMINAL AORTAGRAM N/A 08/10/2011  ? Procedure: ABDOMINAL AORTAGRAM;  Surgeon: Sherren Kerns, MD;  Location[facility] CATH LAB;  Service: Cardiovascular;  Laterality: N/A;  ? AMPUTATION    ? lft foot toes 1.2.3  ? AMPUTATION  01/07/2012  ? Procedure: AMPUTATION RAY;  Surgeon: Sherren Kerns, MD;  Location: [facility] OR;  Service: Vascular;  Laterality: Right;  4TH & 5TH toes  ? aortogram    ? CHOLECYSTECTOMY  1990  ? COLONOSCOPY    ? DILATION AND CURETTAGE OF UTERUS    ? FEMORAL BYPASS  04/29/2008  ? left  ? FEMORAL-POPLITEAL BYPASS GRAFT  08/27/2011  ? Procedure: BYPASS GRAFT FEMORAL-POPLITEAL ARTERY;  Surgeon: Sherren Kerns, MD;   Location: [facility] OR;  Service: Vascular;  Laterality: Right;  ? FEMORAL-POPLITEAL BYPASS GRAFT  08/29/2011  ? Procedure: BYPASS GRAFT FEMORAL-POPLITEAL ARTERY;  Surgeon: Sherren Kerns, MD;  Location: [facility] OR;  Service: Vascular;  Laterality: Right;  Vein Patch Angionplasty and Thrombectomy   ? GASTRIC ROUX-EN-Y N/A 09/02/2017  ? Procedure: LAPAROSCOPIC ROUX-EN-Y GASTRIC BYPASS WITH UPPER ENDOSCOPY AND ERAS PATHWAY;  Surgeon: Kinsinger, De Blanch, MD;  Location: [facility] ORS;  Service: General;  Laterality: N/A;  ? INTRAOPERATIVE ARTERIOGRAM  08/29/2011  ? Procedure: INTRA OPERATIVE ARTERIOGRAM;  Surgeon: Sherren Kerns, MD;  Location: [facility] OR;  Service: Vascular;  Laterality: Right;  ? LOWER EXTREMITY ANGIOGRAM N/A 08/10/2011  ? Procedure: LOWER EXTREMITY ANGIOGRAM;  Surgeon: Sherren Kerns, MD;  Location: [facility] CATH LAB;  Service: Cardiovascular;  Laterality: N/A;  ? THYROIDECTOMY N/A 05/29/2019  ? Procedure: TOTAL THYROIDECTOMY;  Surgeon: Darnell Level, MD;  Location: [facility] ORS;  Service: General;  Laterality: N/A;  ? TOE AMPUTATION  07/12/2008  ?  left foot toes 1,2,3  ? TUBAL LIGATION    ? VASCULAR SURGERY    ? ?Allergies  ?Allergen Reactions  ? Amoxicillin-Pot Clavulanate Shortness Of Breath  ?  Has patient had a PCN reaction causing immediate rash, facial/tongue/throat swelling, SOB or lightheadedness with hypotension: Yes ?Has patient had a PCN reaction causing severe rash involving mucus membranes or skin necrosis: No ?Has patient had a PCN reaction that required hospitalization: Yes ?Has patient had a PCN reaction occurring within the last 10 years: No ?If all of the above answers are "NO", then may proceed with Cephalosporin use. ?  ? Codeine Anaphylaxis  ? Darvocet [Propoxyphene N-Acetaminophen] Anaphylaxis  ?  Plain Tylenol ok  ? Oxycodone Anaphylaxis  ? Statins Other (See Comments)  ?  Joint pain  ? Propoxyphene Swelling  ? ? ?Objective: ?Physical Exam ?General: The patient is alert and oriented x3 in no acute  distress. ? ?Dermatology: Skin is cool, dry and supple bilateral lower extremities. Negative for open lesions or macerations.  Superficial break in the skin noted to the plantar aspect of the left foot.  Upon debridement there is a glass shard within the area.  The glass shard extends into the subcutaneous tissue.  Periwound is intact.  No purulence.  No drainage.  Clinically there does not appear in any acute infection ? ?Vascular: Palpable pedal pulses bilaterally. No edema or erythema noted. Capillary refill within normal limits. ? ?Neurological: Light touch and protective threshold diminished bilaterally.  ? ?Musculoskeletal Exam: All pedal and ankle joints range of motion within normal limits bilateral. Muscle strength 5/5 in all groups bilateral.  ? ? ?Assessment: ?1.  Foreign body glass shard left plantar foot ?2.  Diabetes mellitus w/ history of amputations 1-3 toes LT ? ? ?Plan of Care:  ?1. Patient was evaluated.  X-rays reviewed ?2.  The area to the plantar aspect of the left foot was debrided and evaluated in a glass shard approximately 2 mm long was removed from the area.  Patient felt significant relief.  The glass shard extended into the subcutaneous tissue of the foot ?3.  Clinically the foot appears clean and stable.  Recommend triple antibiotic ointment and a Band-Aid daily ?4.  Appointment with Pedorthist for new diabetic shoes and insoles ?5.  Return to clinic 1 week ? ?Felecia Shelling, DPM ?[facility] ? ?Dr. Felecia Shelling, DPM  ?  ?[address]                                    ?[place], [place] [address]                ?Office [phone]  ?Fax [phone] ? ? ? ? ? ? ?

## 2021-07-04 ENCOUNTER — Ambulatory Visit (INDEPENDENT_AMBULATORY_CARE_PROVIDER_SITE_OTHER): Payer: Commercial Managed Care - HMO

## 2021-07-04 DIAGNOSIS — Z9889 Other specified postprocedural states: Secondary | ICD-10-CM | POA: Diagnosis not present

## 2021-07-04 DIAGNOSIS — M216X2 Other acquired deformities of left foot: Secondary | ICD-10-CM | POA: Diagnosis not present

## 2021-07-04 DIAGNOSIS — M216X1 Other acquired deformities of right foot: Secondary | ICD-10-CM

## 2021-07-04 DIAGNOSIS — E0843 Diabetes mellitus due to underlying condition with diabetic autonomic (poly)neuropathy: Secondary | ICD-10-CM

## 2021-07-04 DIAGNOSIS — M898X7 Other specified disorders of bone, ankle and foot: Secondary | ICD-10-CM

## 2021-07-04 DIAGNOSIS — L84 Corns and callosities: Secondary | ICD-10-CM

## 2021-07-04 NOTE — Progress Notes (Signed)
SITUATION ?Reason for Consult: Evaluation for Bilateral Custom Foot Orthoses ?Patient / Caregiver Report: Patient is ready for foot orthotics ? ?OBJECTIVE DATA: ?Patient History / Diagnosis:  ?  ICD-10-CM   ?1. Status post left foot surgery  Z98.890   ?  ?2. Exostosis of bone of foot  M89.8X7   ?  ?3. Pre-ulcerative calluses  L84   ?  ?4. Diabetes mellitus due to underlying condition with diabetic autonomic neuropathy, unspecified whether long term insulin use (HCC)  E08.43   ?  ? ? ?Current or Previous Devices:   None and no history ? ?Foot Examination: ?Skin presentation:   Compromised ?Ulcers & Callousing:   Historical ?Toe / Foot Deformities:  Left 4th and 5th, right hallux and 2nd ?Weight Bearing Presentation:  Planus ?Sensation:    Compromised ? ?Shoe Size:    39M ? ?ORTHOTIC RECOMMENDATION ?Recommended Device: 1x pair of custom functional foot orthotics ? ?GOALS OF ORTHOSES ?- Reduce Pain ?- Prevent Foot Deformity ?- Prevent Progression of Further Foot Deformity ?- Relieve Pressure ?- Improve the Overall Biomechanical Function of the Foot and Lower Extremity. ? ?ACTIONS PERFORMED ?Potential out of pocket cost was communicated to patient. Patient understood and consent to casting. Patient was casted for Foot Orthoses via crush box. Procedure was explained and patient tolerated procedure well. Casts were shipped to central fabrication. All questions were answered and concerns addressed. ? ?PLAN ?Patient is to be called for fitting when devices are ready.  ? ? ?

## 2021-07-06 ENCOUNTER — Ambulatory Visit (HOSPITAL_COMMUNITY)
Admission: RE | Admit: 2021-07-06 | Discharge: 2021-07-06 | Disposition: A | Discharge status: Home / Self Care | Payer: Commercial Managed Care - HMO | Source: Ambulatory Visit | Attending: Physician Assistant | Admitting: Physician Assistant

## 2021-07-06 ENCOUNTER — Ambulatory Visit (INDEPENDENT_AMBULATORY_CARE_PROVIDER_SITE_OTHER): Payer: Commercial Managed Care - HMO | Admitting: Physician Assistant

## 2021-07-06 ENCOUNTER — Ambulatory Visit (INDEPENDENT_AMBULATORY_CARE_PROVIDER_SITE_OTHER)
Admission: RE | Admit: 2021-07-06 | Discharge: 2021-07-06 | Disposition: A | Discharge status: Home / Self Care | Payer: Commercial Managed Care - HMO | Source: Ambulatory Visit | Attending: Vascular Surgery | Admitting: Vascular Surgery

## 2021-07-06 VITALS — BP 139/74 | HR 80 | Temp 98.0°F | Resp 18 | Ht 67.0 in | Wt 197.3 lb

## 2021-07-06 DIAGNOSIS — I739 Peripheral vascular disease, unspecified: Secondary | ICD-10-CM | POA: Diagnosis not present

## 2021-07-06 NOTE — Progress Notes (Signed)
?Office Note  ? ? ? ?CC:  follow up ?Requesting Provider:  Zachery Dauerdem, Donna S, FNP ? ?HPI: [name] is a 56 y.o. (May 11, 1965) female who presents for surveillance of bilateral lower extremity bypass graft.  She underwent left femoral endarterectomy with femoral to above-the-knee popliteal bypass graft in 2010.  She also underwent right femoral to above-the-knee bypass with vein in 2013 with vein.  She has also had multiple toe amputations of each foot.  Dr. Logan BoresEvans is her podiatrist who is currently treating a small wound of her left lateral foot after stepping on a piece of glass.  She states the wound is almost healed.  She is ambulating without claudication, she denies any rest pain or tissue loss.  She is on aspirin and statin daily.  She is a former smoker. ? ? ?Past Medical History:  ?Diagnosis Date  ? Anemia   ? Anxiety   ? takes xanax daily  ? Anxiety disorder   ? Cataract   ? Mixed form OU  ? Complication of anesthesia   ? Depression, major   ? takes wellbutrin and celexa daily  ? Diabetes mellitus   ? type 2   ? Diabetic retinopathy (HCC)   ? NPDR OU  ? Fibromyalgia   ? GERD (gastroesophageal reflux disease)   ? doesn't take any meds  ? H/O hiatal hernia   ? Hyperlipidemia   ? Hypertension   ? takes Lisinopril and Diovan daily  ? Hypertensive retinopathy   ? OU  ? Hyperthyroidism   ? yrs ago but doesn't require meds for this  ? Immune deficiency disorder (HCC)   ? pt states she has no clue what this is  ? Insomnia   ? Joint pain   ? Joint swelling   ? PTSD (post-traumatic stress disorder)   ? PTSD (post-traumatic stress disorder)   ? PVD (peripheral vascular disease) (HCC)   ? Sleep apnea   ? ? ?Past Surgical History:  ?Procedure Laterality Date  ? ABDOMINAL AORTAGRAM N/A 08/10/2011  ? Procedure: ABDOMINAL AORTAGRAM;  Surgeon: Sherren Kernsharles E Fields, MD;  [facility];  Service: Cardiovascular;  Laterality: N/A;  ? AMPUTATION    ? lft foot toes 1.2.3  ? AMPUTATION  01/07/2012  ? Procedure: AMPUTATION RAY;   Surgeon: Sherren Kernsharles E Fields, MD;  [facility];  Service: Vascular;  Laterality: Right;  4TH & 5TH toes  ? aortogram    ? CHOLECYSTECTOMY  1990  ? COLONOSCOPY    ? DILATION AND CURETTAGE OF UTERUS    ? FEMORAL BYPASS  04/29/2008  ? left  ? FEMORAL-POPLITEAL BYPASS GRAFT  08/27/2011  ? Procedure: BYPASS GRAFT FEMORAL-POPLITEAL ARTERY;  Surgeon: Sherren Kernsharles E Fields, MD;  [facility];  Service: Vascular;  Laterality: Right;  ? FEMORAL-POPLITEAL BYPASS GRAFT  08/29/2011  ? Procedure: BYPASS GRAFT FEMORAL-POPLITEAL ARTERY;  Surgeon: Sherren Kernsharles E Fields, MD;  [facility];  Service: Vascular;  Laterality: Right;  Vein Patch Angionplasty and Thrombectomy   ? GASTRIC ROUX-EN-Y N/A 09/02/2017  ? Procedure: LAPAROSCOPIC ROUX-EN-Y GASTRIC BYPASS WITH UPPER ENDOSCOPY AND ERAS PATHWAY;  Surgeon: Kinsinger, De BlanchLuke Aaron, MD;  Location: [facility] ORS;  Service: General;  Laterality: N/A;  ? INTRAOPERATIVE ARTERIOGRAM  08/29/2011  ? Procedure: INTRA OPERATIVE ARTERIOGRAM;  Surgeon: Sherren Kernsharles E Fields, MD;  [facility];  Service: Vascular;  Laterality: Right;  ? LOWER EXTREMITY ANGIOGRAM N/A 08/10/2011  ? Procedure: LOWER EXTREMITY ANGIOGRAM;  Surgeon: Sherren Kernsharles E Fields, MD;  [facility];  Service: Cardiovascular;  Laterality: N/A;  ? THYROIDECTOMY N/A 05/29/2019  ? Procedure: TOTAL THYROIDECTOMY;  Surgeon: Darnell Level, MD;  Location: [facility] ORS;  Service: General;  Laterality: N/A;  ? TOE AMPUTATION  07/12/2008  ?  left foot toes 1,2,3  ? TUBAL LIGATION    ? VASCULAR SURGERY    ? ? ?Social History  ? ?Socioeconomic History  ? Marital status: Divorced  ?  Spouse name: Not on file  ? Number of children: Not on file  ? Years of education: Not on file  ? Highest education level: Not on file  ?Occupational History  ? Not on file  ?Tobacco Use  ? Smoking status: Former  ?  Packs/day: 1.00  ?  Years: 10.00  ?  Pack years: 10.00  ?  Types: Cigarettes  ?  Quit date: 07/25/2008  ?  Years since quitting: 12.9  ? Smokeless tobacco: Never  ? Tobacco  comments:  ?  quit 72yrs ago  ?Vaping Use  ? Vaping Use: Never used  ?Substance and Sexual Activity  ? Alcohol use: No  ? Drug use: No  ? Sexual activity: Not Currently  ?Other Topics Concern  ? Not on file  ?Social History Narrative  ? Not on file  ? ?Social Determinants of Health  ? ?Financial Resource Strain: Not on file  ?Food Insecurity: Not on file  ?Transportation Needs: Not on file  ?Physical Activity: Not on file  ?Stress: Not on file  ?Social Connections: Not on file  ?Intimate Partner Violence: Not on file  ? ? ?Family History  ?Problem Relation Age of Onset  ? Diabetes Mother   ? Hyperlipidemia Mother   ? Hypertension Mother   ? Diabetes Father   ? Heart disease Father   ?     before age 72  ? Hypertension Father   ? Hyperlipidemia Father   ? Hypertension Sister   ? Diabetes Brother   ? Glaucoma Paternal Grandfather   ? ? ?Current Outpatient Medications  ?Medication Sig Dispense Refill  ? acetaminophen (TYLENOL) 325 MG tablet Take 2 tablets (650 mg total) by mouth every 6 (six) hours as needed for mild pain (or Fever >/= 101).    ? ALPRAZolam (XANAX) 1 MG tablet Take by mouth.    ? buPROPion (WELLBUTRIN) 100 MG tablet bupropion HCl 100 mg tablet    ? calcium-vitamin D (OSCAL WITH D) 500-200 MG-UNIT TABS tablet Oyster Shell Calcium-Vitamin D3 500 mg-5 mcg (200 unit) tablet ? TAKE 2 TABLETS BY MOUTH 3 TIMES DAILY WITH MEALS    ? clobetasol ointment (TEMOVATE) 0.05 % Apply topically.    ? clonazePAM (KLONOPIN) 0.5 MG tablet 1 tablet at bedtime.    ? cyclobenzaprine (FLEXERIL) 10 MG tablet cyclobenzaprine 10 mg tablet ? Take 1 tablet by mouth 2 times a day as needed only for muscle spasms    ? desvenlafaxine (PRISTIQ) 100 MG 24 hr tablet Take 100 mg by mouth every morning.    ? empagliflozin (JARDIANCE) 10 MG TABS tablet Take 1 tablet by mouth daily.    ? ergocalciferol (VITAMIN D2) 1.25 MG (50000 UT) capsule 1 capsule    ? hydrochlorothiazide (HYDRODIURIL) 25 MG tablet hydrochlorothiazide 25 mg tablet    ?  levothyroxine (SYNTHROID) 150 MCG tablet Take 150 mcg by mouth every morning.    ? losartan (COZAAR) 100 MG tablet Take 1 tablet by mouth daily.    ? metFORMIN (GLUCOPHAGE) 1000 MG tablet Take 1 tablet by mouth 2 (two) times daily.    ?  nitrofurantoin, macrocrystal-monohydrate, (MACROBID) 100 MG capsule nitrofurantoin monohydrate/macrocrystals 100 mg capsule ? TAKE 1 CAPSULE BY MOUTH EVERY 12 HOURS FOR 7 DAYS    ? nystatin powder Apply 1 application topically 3 (three) times daily. 15 g 0  ? nystatin-triamcinolone (MYCOLOG II) cream nystatin-triamcinolone 100,000 unit/g-0.1 % topical cream ? APPLY TOPICALLY TO THE AFFECTED AREA(S) 2 TIMES PER DAY IN THE MORNING AND EVENING    ? rosuvastatin (CRESTOR) 10 MG tablet rosuvastatin 10 mg tablet    ? Semaglutide, 1 MG/DOSE, (OZEMPIC, 1 MG/DOSE,) 4 MG/3ML SOPN Ozempic 1 mg/dose (4 mg/3 mL) subcutaneous pen injector    ? sulfamethoxazole-trimethoprim (BACTRIM) 400-80 MG tablet Take 1 tablet by mouth 2 (two) times daily.    ? topiramate (TOPAMAX) 50 MG tablet topiramate 50 mg tablet    ? ?No current facility-administered medications for this visit.  ? ? ?Allergies  ?Allergen Reactions  ? Amoxicillin-Pot Clavulanate Shortness Of Breath  ?  Has patient had a PCN reaction causing immediate rash, facial/tongue/throat swelling, SOB or lightheadedness with hypotension: Yes ?Has patient had a PCN reaction causing severe rash involving mucus membranes or skin necrosis: No ?Has patient had a PCN reaction that required hospitalization: Yes ?Has patient had a PCN reaction occurring within the last 10 years: No ?If all of the above answers are "NO", then may proceed with Cephalosporin use. ?  ? Codeine Anaphylaxis  ? Darvocet [Propoxyphene N-Acetaminophen] Anaphylaxis  ?  Plain Tylenol ok  ? Oxycodone Anaphylaxis  ? Statins Other (See Comments)  ?  Joint pain  ? Propoxyphene Swelling  ? ? ? ?REVIEW OF SYSTEMS:  ? ?[X]  denotes positive finding, [ ]  denotes negative finding ?Cardiac   Comments:  ?Chest pain or chest pressure:    ?Shortness of breath upon exertion:    ?Short of breath when lying flat:    ?Irregular heart rhythm:    ?    ?Vascular    ?Pain in calf, thigh, or hip brought on b

## 2021-07-21 ENCOUNTER — Telehealth: Payer: Self-pay

## 2021-07-21 NOTE — Telephone Encounter (Signed)
Left message on vm to call back to set up appointment.

## 2021-07-30 IMAGING — CT CT NECK W/ CM
3 of 4 series · 6 of 14 positions shown, 7 images · IV contrast (iopamidol)
Comparison: Ultrasound 01/02/2019.  CT 08/20/2005.

CLINICAL DATA: Multinodular goiter. Difficulty swallowing. Neck
fullness. History of Graves disease.

EXAM:
CT NECK WITH CONTRAST
TECHNIQUE: Multidetector CT imaging of the neck was performed using the
standard protocol following the bolus administration of intravenous
contrast.
CONTRAST:  75mL [id number] IOPAMIDOL ([id number]) INJECTION 61%

[Series 3: neck · axial · 0.42mm/px · z∈[-221,-141]mm · 2 of 121 slices shown, 3 images]
[im 41/121  soft-tissue]
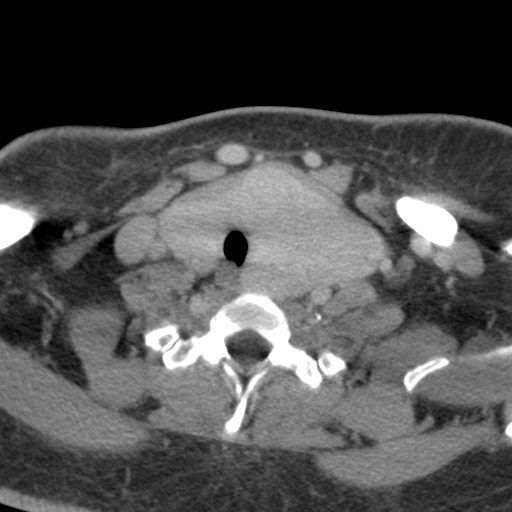
[im 41/121  bone]
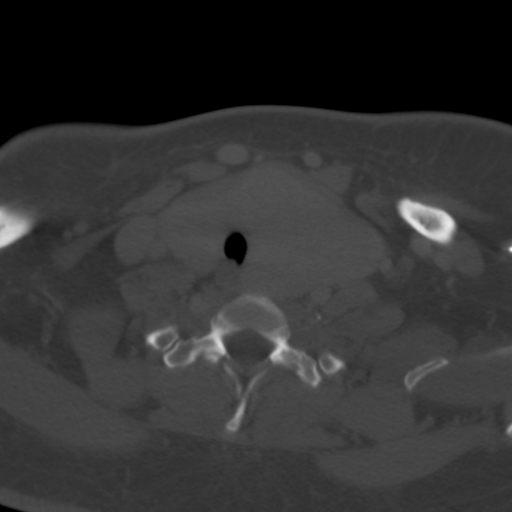
[im 81/121  bone]
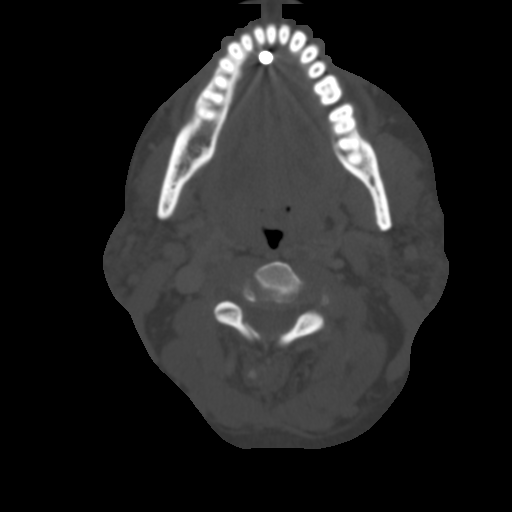

[Series 8: angled axial-oropharynx · axial · 0.35mm/px · z∈[-239,-160]mm · 2 of 122 slices shown]
[im 41/122  bone]
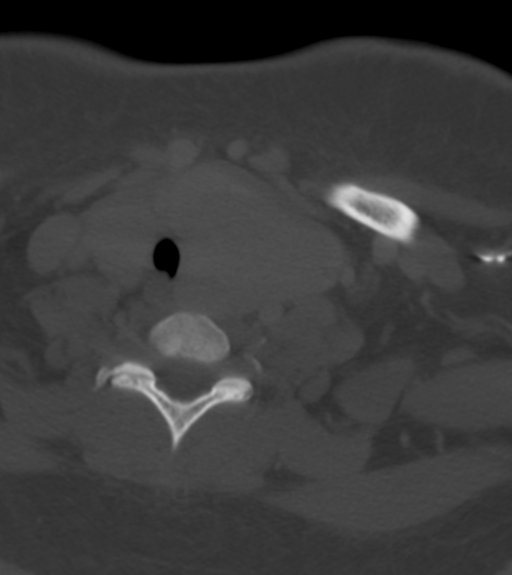
[im 81/122  bone]
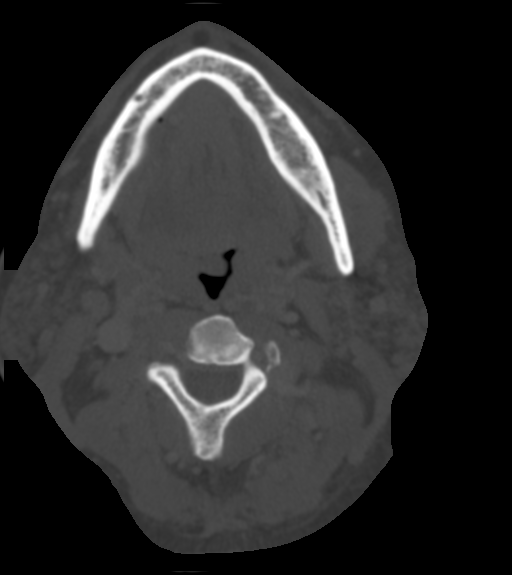

[Series 9: angled (person_name) · axial · 0.45mm/px · z∈[-182,-103]mm · 2 of 122 slices shown]
[im 41/122  bone]
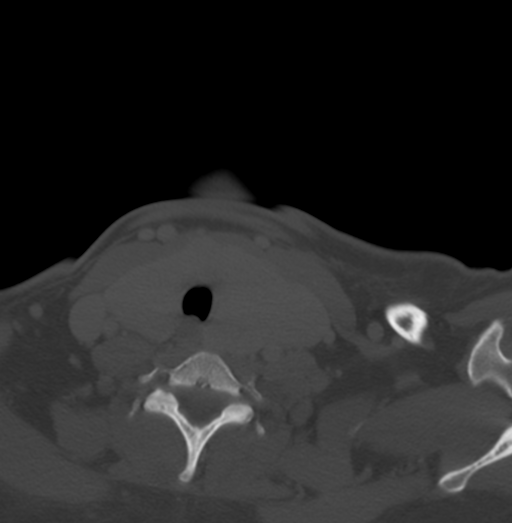
[im 81/122  bone]
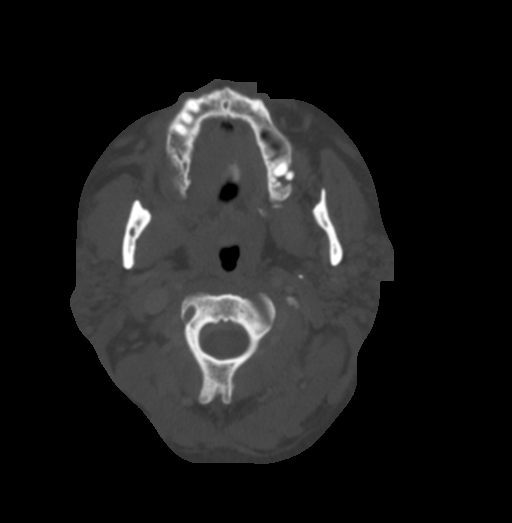

[6 of 14 positions shown; findings below may reference images not displayed]

FINDINGS: Pharynx and larynx: No mucosal or submucosal lesion. Mild extrinsic
constriction of the trachea at the cervicothoracic junction due to
thyroid enlargement. Potential also for some encroachment upon the
cervical esophagus.

Salivary glands: Parotid and submandibular glands are normal.

Thyroid: Massive thyroid enlargement but without evidence of focal
nodule. Right lobe measures 7.6 cm cephalo caudal with diameter of
3.5 x 3.2 cm at the level of the isthmus. Left lobe measures 9.1 cm
cephalo caudal and shows intrathoracic extension. Transverse
diameter at the level of the isthmus is 5.2 x 4.0 cm. Isthmus
measures up to 2.2 cm in thickness.

Compared to the study of 8775, the gland shows slight further
generalized enlargement.

Lymph nodes: No enlarged or abnormal density nodes on either side of
the neck.

Vascular: No acute vascular finding at both carotid. Ordinary
atherosclerosis bifurcations.

Limited intracranial: Normal

Visualized orbits: Normal

Mastoids and visualized paranasal sinuses: Normal

Skeleton: Ordinary spondylosis.

Upper chest: Lung apices are clear. Ordinary aortic atherosclerosis.
Thoracic esophagus is dilated.

Other: None
IMPRESSION: Diffuse enlargement of the thyroid gland, more on the left lobe than
the right, without discernible focal nodule. Recent ultrasound
showed a diffusely heterogeneous nature. The gland has enlarged
further since the study of 8775. Cephalo caudal length of the right
lobe at that time was 7.3 cm in the left lobe was 7.8 cm.

## 2022-03-26 ENCOUNTER — Ambulatory Visit
Admission: EM | Admit: 2022-03-26 | Discharge: 2022-03-26 | Disposition: A | Discharge status: Home / Self Care | Payer: Medicaid Other | Attending: Nurse Practitioner | Admitting: Nurse Practitioner

## 2022-03-26 DIAGNOSIS — R300 Dysuria: Secondary | ICD-10-CM

## 2022-03-26 DIAGNOSIS — N760 Acute vaginitis: Secondary | ICD-10-CM

## 2022-03-26 DIAGNOSIS — E039 Hypothyroidism, unspecified: Secondary | ICD-10-CM

## 2022-03-26 DIAGNOSIS — I10 Essential (primary) hypertension: Secondary | ICD-10-CM | POA: Diagnosis not present

## 2022-03-26 DIAGNOSIS — E119 Type 2 diabetes mellitus without complications: Secondary | ICD-10-CM

## 2022-03-26 LAB — POCT URINALYSIS DIP (MANUAL ENTRY)
Bilirubin, UA: NEGATIVE
Glucose, UA: 250 mg/dL — AB
Ketones, POC UA: NEGATIVE mg/dL
Leukocytes, UA: NEGATIVE
Nitrite, UA: NEGATIVE
Spec Grav, UA: 1.025 (ref 1.010–1.025)
Urobilinogen, UA: 0.2 E.U./dL
pH, UA: 5.5 (ref 5.0–8.0)

## 2022-03-26 MED ORDER — CLOBETASOL PROPIONATE 0.05 % EX OINT: TOPICAL_OINTMENT | Freq: Two times a day (BID) | CUTANEOUS | 0 refills | Status: DC

## 2022-03-26 MED ORDER — LEVOTHYROXINE SODIUM 150 MCG PO TABS
150.0000 ug | ORAL_TABLET | Freq: Every day | ORAL | 0 refills | Status: AC
Start: 2022-03-26 — End: ?

## 2022-03-26 MED ORDER — HYDROCHLOROTHIAZIDE 25 MG PO TABS
25.0000 mg | ORAL_TABLET | Freq: Every morning | ORAL | 0 refills | Status: DC
Start: 2022-03-26 — End: 2023-03-25

## 2022-03-26 MED ORDER — FLUCONAZOLE 150 MG PO TABS: 150.0000 mg | ORAL_TABLET | ORAL | 0 refills | Status: AC

## 2022-03-26 MED ORDER — METRONIDAZOLE 500 MG PO TABS: 500.0000 mg | ORAL_TABLET | Freq: Two times a day (BID) | ORAL | 0 refills | Status: DC

## 2022-03-26 MED ORDER — METFORMIN HCL 1000 MG PO TABS
1000.0000 mg | ORAL_TABLET | Freq: Two times a day (BID) | ORAL | 0 refills | Status: DC
Start: 2022-03-26 — End: 2023-03-25

## 2022-03-26 MED ORDER — LOSARTAN POTASSIUM 100 MG PO TABS
100.0000 mg | ORAL_TABLET | Freq: Every day | ORAL | 0 refills | Status: DC
Start: 2022-03-26 — End: 2023-03-25

## 2022-03-26 NOTE — ED Provider Notes (Signed)
[name]    CSN: [id number] Arrival date & time: 03/26/22  1431      History   Chief Complaint Chief Complaint  Patient presents with   vaginal discharge and refill    HPI Kelly Barber is a 57 y.o. female.   Subjective:   Kelly Barber is a 57 y.o. female who presents for evaluation of vaginal symptoms of burning, itching, and perineal odor.  No vaginal discharge or irritation.  Symptoms have been present for 2 weeks and without any improvement despite OTC vaginal creams. Patient is post menopausal. She denies any recent sexual activity. Last sexual encounter has been about 5-6 months ago. Patient reports that she is under a lot of stress. She is currently in school and also takes care of her severely autistic/nonverbal adult son. She recently lost her PCP and has a new PCP appointment scheduled for 04/28/22. She has been out of her medications for the past couple of weeks. She is requesting refills on her hypertension, diabetes and thyroid medications. Patient is also out of her mental health medications but reports that her psychiatrist is going to take care of that for her. Patient denies any fevers, visual changes, chest pain, shortness of breath or headache.   The following portions of the patient's history were reviewed and updated as appropriate: allergies, current medications, past family history, past medical history, past social history, past surgical history, and problem list.       Past Medical History:  Diagnosis Date   Anemia    Anxiety    takes xanax daily   Anxiety disorder    Cataract    Mixed form OU   Complication of anesthesia    Depression, major    takes wellbutrin and celexa daily   Diabetes mellitus    type 2    Diabetic retinopathy ([place])    NPDR OU   Fibromyalgia    GERD (gastroesophageal reflux disease)    doesn't take any meds   H/O hiatal hernia    Hyperlipidemia    Hypertension    takes Lisinopril and Diovan daily    Hypertensive retinopathy    OU   Hyperthyroidism    yrs ago but doesn't require meds for this   Immune deficiency disorder ([place])    pt states she has no clue what this is   Insomnia    Joint pain    Joint swelling    PTSD (post-traumatic stress disorder)    PTSD (post-traumatic stress disorder)    PVD (peripheral vascular disease) ([place])    Sleep apnea     Patient Active Problem List   Diagnosis Date Noted   Substernal thyroid goiter 05/26/2019   Subclinical hyperthyroidism 05/26/2019   Morbid obesity ([place]) 09/02/2017   Glaucoma 06/15/2014   S/P amputation of foot ([place]) 06/14/2014   Depression 06/14/2014   Diabetic neuropathy ([place]) 06/14/2014   Aftercare following surgery of the circulatory system, NEC 12/20/2011   Atherosclerosis of native arteries of the extremities with ulceration(440.23) 12/20/2011   Preop cardiovascular exam 08/16/2011   Atherosclerosis of native arteries of the extremities with intermittent claudication 07/26/2011   CELLULITIS AND ABSCESS OF TRUNK 08/09/2008   LOWER LIMB AMPUTATION, OTHER TOE 08/03/2008   PERIPHERAL VASCULAR DISEASE 04/29/2008   LEUKOCYTOCLASTIC VASCULITIS 04/28/2008   VASCULAR PURPURA 04/21/2008   GERD 02/23/2008   GOITER, MULTINODULAR 12/19/2006   Diabetes ([place]) 12/19/2006   ANXIETY 12/19/2006   DEPRESSION 12/19/2006   Essential hypertension 12/19/2006   GALLSTONES  12/19/2006   FIBROMYALGIA 12/19/2006   WEIGHT GAIN 12/19/2006   CHEST PAIN, NON-CARDIAC 12/19/2006    Past Surgical History:  Procedure Laterality Date   ABDOMINAL AORTAGRAM N/A 08/10/2011   Procedure: ABDOMINAL [name];  Surgeon: Elam Dutch, MD;  Location: [facility] CATH LAB;  Service: Cardiovascular;  Laterality: N/A;   AMPUTATION     lft foot toes 1.2.3   AMPUTATION  01/07/2012   Procedure: AMPUTATION RAY;  Surgeon: Elam Dutch, MD;  Location: [place];  Service: Vascular;  Laterality: Right;  4TH & 5TH toes   aortogram     [place] OF UTERUS     FEMORAL BYPASS  04/29/2008   left   FEMORAL-POPLITEAL BYPASS GRAFT  08/27/2011   Procedure: BYPASS GRAFT FEMORAL-POPLITEAL ARTERY;  Surgeon: Elam Dutch, MD;  Location: [place];  Service: Vascular;  Laterality: Right;   FEMORAL-POPLITEAL BYPASS GRAFT  08/29/2011   Procedure: BYPASS GRAFT FEMORAL-POPLITEAL ARTERY;  Surgeon: Elam Dutch, MD;  Location: [place];  Service: Vascular;  Laterality: Right;  Vein Patch Angionplasty and Thrombectomy    GASTRIC ROUX-EN-Y N/A 09/02/2017   Procedure: LAPAROSCOPIC ROUX-EN-Y GASTRIC BYPASS WITH UPPER ENDOSCOPY AND ERAS PATHWAY;  Surgeon: Kinsinger, Arta Bruce, MD;  Location: [facility];  Service: General;  Laterality: N/A;   INTRAOPERATIVE ARTERIOGRAM  08/29/2011   Procedure: INTRA OPERATIVE ARTERIOGRAM;  Surgeon: Elam Dutch, MD;  Location: [place];  Service: Vascular;  Laterality: Right;   LOWER EXTREMITY ANGIOGRAM N/A 08/10/2011   Procedure: LOWER EXTREMITY ANGIOGRAM;  Surgeon: Elam Dutch, MD;  Location: [facility] CATH LAB;  Service: Cardiovascular;  Laterality: N/A;   THYROIDECTOMY N/A 05/29/2019   Procedure: TOTAL THYROIDECTOMY;  Surgeon: Armandina Gemma, MD;  Location: [facility];  Service: General;  Laterality: N/A;   TOE AMPUTATION  07/12/2008    left foot toes 1,2,3   TUBAL LIGATION     VASCULAR SURGERY      OB History   No obstetric history on file.      Home Medications    Prior to Admission medications   Medication Sig Start Date End Date Taking? Authorizing Provider  fluconazole (DIFLUCAN) 150 MG tablet Take 1 tablet (150 mg total) by mouth every 3 (three) days for 2 doses. 03/26/22 03/30/22 Yes Enrique Sack, FNP  metroNIDAZOLE (FLAGYL) 500 MG tablet Take 1 tablet (500 mg total) by mouth 2 (two) times daily. 03/26/22  Yes Enrique Sack, FNP  acetaminophen (TYLENOL) 325 MG tablet Take 2 tablets (650 mg total) by mouth every 6 (six) hours as needed for mild pain (or Fever >/= 101). 05/30/19    Clovis Riley, MD  ALPRAZolam Duanne Moron) 1 MG tablet Take by mouth. 05/24/20   [provider]  calcium-vitamin D (OSCAL WITH D) 500-200 MG-UNIT TABS tablet [place] Calcium-Vitamin D3 500 mg-5 mcg (200 unit) tablet  TAKE 2 TABLETS BY MOUTH 3 TIMES DAILY WITH MEALS    [provider]  clobetasol ointment (TEMOVATE) 0.05 % Apply topically 2 (two) times daily. 03/26/22   Enrique Sack, FNP  ergocalciferol (VITAMIN D2) 1.25 MG (50000 UT) capsule 1 capsule    [provider]  hydrochlorothiazide (HYDRODIURIL) 25 MG tablet Take 1 tablet (25 mg total) by mouth in the morning. 03/26/22   Enrique Sack, FNP  levothyroxine (SYNTHROID) 150 MCG tablet Take 1 tablet (150 mcg total) by mouth daily before breakfast. 03/26/22   Enrique Sack, FNP  losartan (COZAAR) 100  MG tablet Take 1 tablet (100 mg total) by mouth daily. 03/26/22   Enrique Sack, FNP  metFORMIN (GLUCOPHAGE) 1000 MG tablet Take 1 tablet (1,000 mg total) by mouth 2 (two) times daily with a meal. 03/26/22   Enrique Sack, FNP  nystatin powder Apply 1 application topically 3 (three) times daily. 12/29/20   Teodora Medici, FNP  nystatin-triamcinolone (MYCOLOG II) cream nystatin-triamcinolone 100,000 unit/g-0.1 % topical cream  APPLY TOPICALLY TO THE AFFECTED AREA(S) 2 TIMES PER DAY IN THE MORNING AND EVENING    [provider]  Semaglutide, 1 MG/DOSE, (OZEMPIC, 1 MG/DOSE,) 4 MG/3ML SOPN Ozempic 1 mg/dose (4 mg/3 mL) subcutaneous pen injector    [provider]    Family History Family History  Problem Relation Age of Onset   Diabetes Mother    Hyperlipidemia Mother    Hypertension Mother    Diabetes Father    Heart disease Father        before age 9   Hypertension Father    Hyperlipidemia Father    Hypertension Sister    Diabetes Brother    Glaucoma Paternal Grandfather     Social History Social History   Tobacco Use   Smoking status: Former    Packs/day: 1.00     Years: 10.00    Total pack years: 10.00    Types: Cigarettes    Quit date: 07/25/2008    Years since quitting: 13.6   Smokeless tobacco: Never   Tobacco comments:    quit 82yrs ago  Vaping Use   Vaping Use: Never used  Substance Use Topics   Alcohol use: No   Drug use: No     Allergies   Amoxicillin-pot clavulanate, Codeine, Darvocet [propoxyphene n-acetaminophen], Oxycodone, Statins, and Propoxyphene   Review of Systems Review of Systems  Constitutional:  Positive for fatigue. Negative for activity change, appetite change and fever.  Respiratory:  Negative for cough and shortness of breath.   Cardiovascular:  Negative for chest pain and palpitations.  Gastrointestinal:  Negative for diarrhea, nausea and vomiting.  Genitourinary:  Positive for dysuria. Negative for flank pain and vaginal discharge.  Musculoskeletal:  Negative for myalgias.  Neurological:  Negative for dizziness and headaches.  All other systems reviewed and are negative.    Physical Exam Triage Vital Signs ED Triage Vitals [03/26/22 1543]  Enc Vitals Group     BP (!) 179/100     Pulse Rate 73     Resp 16     Temp 98 F (36.7 C)     Temp Source Oral     SpO2 97 %     Weight      Height      Head Circumference      Peak Flow      Pain Score 0     Pain Loc      Pain Edu?      Excl. in [place]?    No data found.  Updated Vital Signs BP (!) 179/100 (BP Location: Left Arm)   Pulse 73   Temp 98 F (36.7 C) (Oral)   Resp 16   LMP 03/05/2016 Comment: tubal ligation 2011  SpO2 97%   Visual Acuity Right Eye Distance:   Left Eye Distance:   Bilateral Distance:    Right Eye Near:   Left Eye Near:    Bilateral Near:     Physical Exam Constitutional:      General: She is not in acute distress.    Appearance:  Normal appearance. She is not ill-appearing, toxic-appearing or diaphoretic.  HENT:     Head: Normocephalic.  Eyes:     Conjunctiva/sclera: Conjunctivae normal.  Cardiovascular:      Rate and Rhythm: Normal rate and regular rhythm.  Pulmonary:     Effort: Pulmonary effort is normal.     Breath sounds: Normal breath sounds.  Genitourinary:    Comments: Exam deferred.  Patient perform self swab for testing. Musculoskeletal:        General: Normal range of motion.     Cervical back: Normal range of motion and neck supple.  Skin:    General: Skin is warm and dry.  Neurological:     General: No focal deficit present.     Mental Status: She is alert and oriented to person, place, and time.  Psychiatric:        Attention and Perception: Attention normal.        Mood and Affect: Mood is depressed. Affect is tearful.        Speech: Speech normal.        Behavior: Behavior normal. Behavior is cooperative.        Thought Content: Thought content does not include homicidal or suicidal ideation.        Cognition and Memory: Cognition and memory normal.        Judgment: Judgment normal.      UC Treatments / Results  Labs (all labs ordered are listed, but only abnormal results are displayed) Labs Reviewed  POCT URINALYSIS DIP (MANUAL ENTRY) - Abnormal; Notable for the following components:      Result Value   Clarity, UA cloudy (*)    Glucose, UA =250 (*)    Blood, UA trace-intact (*)    Protein Ur, POC trace (*)    All other components within normal limits  URINE CULTURE  CERVICOVAGINAL ANCILLARY ONLY    EKG   Radiology No results found.  Procedures Procedures (including critical care time)  Medications Ordered in UC Medications - No data to display  Initial Impression / Assessment and Plan / UC Course  I have reviewed the triage vital signs and the nursing notes.  Pertinent labs & imaging results that were available during my care of the patient were reviewed by me and considered in my medical decision making (see chart for details).    57 y.o. female who presents for evaluation of vaginal symptoms of burning, itching, and perineal odor been going on  for about 2 weeks.  Patient also is requesting refills on her hypertension, diabetes and thyroid medications.  She has been out of these medications for at least the past couple of weeks. No fevers, visual changes, chest pain, shortness of breath or headache.  Urinalysis without any acute infection.  Urine cultures as well as testing for gonorrhea, trichomonas, BV, yeast and chlamydia pending.  Will treat with Flagyl and Diflucan for now.  Further recommendations pending results of these outstanding test.  Patient afebrile.  Nontoxic.  Hypertensive at 179/100.  No evidence of hypertensive urgency or hypertensive emergency.  Patient with depressed mood.  No SI/HI.  30-day refill of the requested medications provided.  Patient advised to keep follow-up with her mental health provider and new PCP as scheduled.  Today's evaluation has revealed no signs of a dangerous process. Discussed diagnosis with patient and/or guardian. Patient and/or guardian aware of their diagnosis, possible red flag symptoms to watch out for and need for close follow up. Patient and/or guardian  understands verbal and written discharge instructions. Patient and/or guardian comfortable with plan and disposition.  Patient and/or guardian has a clear mental status at this time, good insight into illness (after discussion and teaching) and has clear judgment to make decisions regarding their care  Documentation was completed with the aid of voice recognition software. Transcription may contain typographical errors. Final Clinical Impressions(s) / UC Diagnoses   Final diagnoses:  Dysuria  Vaginitis and vulvovaginitis  Essential hypertension  Hypothyroidism, unspecified type  Type 2 diabetes mellitus without complication, without long-term current use of insulin (HCC)     Discharge Instructions      Your blood pressure was elevated today.  Managing your hypertension is very important. Over time, hypertension can damage the arteries  and decrease blood flow to parts of the body, including the brain, heart, and kidneys. Having untreated or uncontrolled hypertension can lead to  heart attack, stroke, weakened blood vessels (aneurysm), heart failure, kidney damage, eye damage, memory/concentration problems and vascular dementia. Please monitor your blood pressure closely.  I have provided you a 30-day supply of your blood pressure, diabetes and thyroid medication.  I also provided you a refill of your clobetasol ointment.    Your urine did not show any signs of infection but I have sent it off for cultures to see if there is any bacterial growth.  For now, I will start you on a medication for possible vaginal bacterial/yeast infection.  Testing for bacteria vaginosis, yeast, gonorrhea, chlamydia and trichomonas is pending. You should not have any sexual activity until you receive the results of the tests. You will only be notified for positive results. You may go online to MyChart and review your results.   Go to the ED immediately if you develop a severe headache, dizziness, sudden vision problems, confusion, experience unusual weakness or numbness, severe pain in your chest or abdomen, you vomit repeatedly or have trouble breathing.    Follow-up with your psychiatrist and primary care provider as scheduled.       ED Prescriptions     Medication Sig Dispense Auth. Provider   clobetasol ointment (TEMOVATE) 0.05 % Apply topically 2 (two) times daily. 30 g Lurline Idol, FNP   hydrochlorothiazide (HYDRODIURIL) 25 MG tablet Take 1 tablet (25 mg total) by mouth in the morning. 30 tablet Lurline Idol, FNP   levothyroxine (SYNTHROID) 150 MCG tablet Take 1 tablet (150 mcg total) by mouth daily before breakfast. 30 tablet Lurline Idol, FNP   losartan (COZAAR) 100 MG tablet Take 1 tablet (100 mg total) by mouth daily. 30 tablet Lurline Idol, FNP   metFORMIN (GLUCOPHAGE) 1000 MG tablet Take 1 tablet (1,000 mg total)  by mouth 2 (two) times daily with a meal. 30 tablet Lurline Idol, FNP   metroNIDAZOLE (FLAGYL) 500 MG tablet Take 1 tablet (500 mg total) by mouth 2 (two) times daily. 14 tablet Lurline Idol, FNP   fluconazole (DIFLUCAN) 150 MG tablet Take 1 tablet (150 mg total) by mouth every 3 (three) days for 2 doses. 2 tablet Lurline Idol, FNP      PDMP not reviewed this encounter.   Lurline Idol, [place] 03/26/22 [phone]

## 2022-03-26 NOTE — ED Triage Notes (Signed)
Pt here for med refill. Having trouble getting in with PCP. Losartan 100mg  po qd. And needs refill for HCTZ, levothyroxine, metformin, rosuvastatin,   Also here for vaginal discharge and itching. Onset ~ 1 week ago.

## 2022-03-26 NOTE — Discharge Instructions (Signed)
Your blood pressure was elevated today.  Managing your hypertension is very important. Over time, hypertension can damage the arteries and decrease blood flow to parts of the body, including the brain, heart, and kidneys. Having untreated or uncontrolled hypertension can lead to  heart attack, stroke, weakened blood vessels (aneurysm), heart failure, kidney damage, eye damage, memory/concentration problems and vascular dementia. Please monitor your blood pressure closely.  I have provided you a 30-day supply of your blood pressure, diabetes and thyroid medication.  I also provided you a refill of your clobetasol ointment.    Your urine did not show any signs of infection but I have sent it off for cultures to see if there is any bacterial growth.  For now, I will start you on a medication for possible vaginal bacterial/yeast infection.  Testing for bacteria vaginosis, yeast, gonorrhea, chlamydia and trichomonas is pending. You should not have any sexual activity until you receive the results of the tests. You will only be notified for positive results. You may go online to [place] and review your results.   Go to the ED immediately if you develop a severe headache, dizziness, sudden vision problems, confusion, experience unusual weakness or numbness, severe pain in your chest or abdomen, you vomit repeatedly or have trouble breathing.    Follow-up with your psychiatrist and primary care provider as scheduled.

## 2022-03-27 LAB — CERVICOVAGINAL ANCILLARY ONLY
Bacterial Vaginitis (gardnerella): NEGATIVE
Candida Glabrata: NEGATIVE
Candida Vaginitis: POSITIVE — AB
Chlamydia: NEGATIVE
Comment: NEGATIVE
Comment: NEGATIVE
Comment: NEGATIVE
Comment: NEGATIVE
Comment: NEGATIVE
Comment: NORMAL
Neisseria Gonorrhea: NEGATIVE
Trichomonas: NEGATIVE

## 2022-03-29 ENCOUNTER — Telehealth (HOSPITAL_COMMUNITY): Payer: Self-pay | Admitting: Emergency Medicine

## 2022-03-29 LAB — URINE CULTURE: Culture: >=100000 — AB

## 2022-03-29 MED ORDER — NITROFURANTOIN MONOHYD MACRO 100 MG PO CAPS: 100.0000 mg | ORAL_CAPSULE | Freq: Two times a day (BID) | ORAL | 0 refills | Status: DC

## 2022-03-29 NOTE — Telephone Encounter (Signed)
Lmom to call back to schedule appt to pick up orthotics   Balance 32.86

## 2022-04-23 ENCOUNTER — Ambulatory Visit: Payer: Medicaid Other | Admitting: *Deleted

## 2022-04-23 DIAGNOSIS — E0843 Diabetes mellitus due to underlying condition with diabetic autonomic (poly)neuropathy: Secondary | ICD-10-CM

## 2022-04-23 NOTE — Progress Notes (Signed)
Patient presents today to pick up custom molded accommodative foot orthotics, diagnosed with diabetic neuropathy by Dr. Amalia Hailey.   Orthotics were dispensed and fit was satisfactory. Reviewed instructions for break-in and wear. Written instructions given to patient.  Patient will follow up as needed.   [facility] - order # [id number]

## 2022-07-19 ENCOUNTER — Other Ambulatory Visit: Payer: Self-pay | Admitting: *Deleted

## 2022-07-19 DIAGNOSIS — I739 Peripheral vascular disease, unspecified: Secondary | ICD-10-CM

## 2022-07-31 ENCOUNTER — Ambulatory Visit (INDEPENDENT_AMBULATORY_CARE_PROVIDER_SITE_OTHER): Payer: Medicare Other | Admitting: Physician Assistant

## 2022-07-31 ENCOUNTER — Encounter: Payer: Self-pay | Admitting: Physician Assistant

## 2022-07-31 ENCOUNTER — Ambulatory Visit (INDEPENDENT_AMBULATORY_CARE_PROVIDER_SITE_OTHER)
Admission: RE | Admit: 2022-07-31 | Discharge: 2022-07-31 | Disposition: A | Discharge status: Home / Self Care | Payer: Medicare Other | Source: Ambulatory Visit | Attending: Vascular Surgery

## 2022-07-31 ENCOUNTER — Ambulatory Visit (HOSPITAL_COMMUNITY)
Admission: RE | Admit: 2022-07-31 | Discharge: 2022-07-31 | Disposition: A | Discharge status: Home / Self Care | Payer: Medicare Other | Source: Ambulatory Visit | Attending: Vascular Surgery | Admitting: Vascular Surgery

## 2022-07-31 VITALS — BP 88/58 | HR 88 | Temp 97.7°F | Resp 18 | Ht 67.0 in | Wt 191.8 lb

## 2022-07-31 DIAGNOSIS — I739 Peripheral vascular disease, unspecified: Secondary | ICD-10-CM

## 2022-07-31 LAB — VAS US ABI WITH/WO TBI
Left ABI: 0.83
Right ABI: 0.92

## 2022-07-31 NOTE — Progress Notes (Signed)
HISTORY AND PHYSICAL     CC:  follow up. Requesting Provider:  Zachery Dauer, FNP  HPI: This is a 57 y.o. female who is here today for follow up for PAD.  Pt has hx of left CFA endarterectomy and left femoral to AK popliteal bypass with GSV by Dr. Darrick Penna on 07/05/2010.  She subsequently underwent amputations of toes 1, 2, and 3 on the left foot on 07/13/2010 also by Dr. Darrick Penna.  She underwent right femoral to AK popliteal bypass with GSV on 08/27/2011 also by Dr. Darrick Penna for rest pain.  On 08/29/2011, her RLE bypass thrombosed and she underwent thrombectomy of RLE bypass also by Dr. Darrick Penna. On 01/07/2012, she underwent amputation of right 4th and 5th toes also by Dr. Darrick Penna.   Pt was last seen 07/06/2021 and at that time, she was ambulating without claudication and did not have rest pain or tissue loss. She was compliant with asa/statin.  She did have a palpable PT pulse.  She had low flow volumes in the proximal bypass on the right but given she had palpable pulse, it was not felt to be of significance.  Her left leg bypass was widely patent.  She was continuing to follow with Podiatry.   The pt returns today for follow up.  She states that she has neuropathy and that she has pain in the amputated toes at night.  She states that her gabapentin has recently been increased.  She has not been taking her asa or Plavix.  She states that she used to be on Crestor but was recently changed to Lipitor and she is having muscle cramps at night.  She states she did not have these issues with the Crestor.  She does not have any non healing wounds.  She states that she can walk about a block but then has to stop and then she can keep going.  She takes care of her special needs son who is now 31 y/o.  She continues not to smoke.   She did have bariatric surgery and has lost about 130 lbs.  She has hx of thyroid cancer with thyroidectomy in 2020.    The pt is on a statin for cholesterol management.    The pt is not on an  aspirin.    Other AC:  none The pt is on ARB for hypertension.  The pt is  on medication for diabetes. Tobacco hx:  former  Pt does not have family hx of AAA.  Past Medical History:  Diagnosis Date   Anemia    Anxiety    takes xanax daily   Anxiety disorder    Cataract    Mixed form OU   Complication of anesthesia    Depression, major    takes wellbutrin and celexa daily   Diabetes mellitus    type 2    Diabetic retinopathy (HCC)    NPDR OU   Fibromyalgia    GERD (gastroesophageal reflux disease)    doesn't take any meds   H/O hiatal hernia    Hyperlipidemia    Hypertension    takes Lisinopril and Diovan daily   Hypertensive retinopathy    OU   Hyperthyroidism    yrs ago but doesn't require meds for this   Immune deficiency disorder (HCC)    pt states she has no clue what this is   Insomnia    Joint pain    Joint swelling    PTSD (post-traumatic stress disorder)  PTSD (post-traumatic stress disorder)    PVD (peripheral vascular disease) (HCC)    Sleep apnea     Past Surgical History:  Procedure Laterality Date   ABDOMINAL AORTAGRAM N/A 08/10/2011   Procedure: ABDOMINAL Ronny Flurry;  Surgeon: Sherren Kerns, MD;  Location: [facility] CATH LAB;  Service: Cardiovascular;  Laterality: N/A;   AMPUTATION     lft foot toes 1.2.3   AMPUTATION  01/07/2012   Procedure: AMPUTATION RAY;  Surgeon: Sherren Kerns, MD;  Location: [facility] OR;  Service: Vascular;  Laterality: Right;  4TH & 5TH toes   aortogram     CHOLECYSTECTOMY  1990   COLONOSCOPY     DILATION AND CURETTAGE OF UTERUS     FEMORAL BYPASS  04/29/2008   left   FEMORAL-POPLITEAL BYPASS GRAFT  08/27/2011   Procedure: BYPASS GRAFT FEMORAL-POPLITEAL ARTERY;  Surgeon: Sherren Kerns, MD;  Location: [facility] OR;  Service: Vascular;  Laterality: Right;   FEMORAL-POPLITEAL BYPASS GRAFT  08/29/2011   Procedure: BYPASS GRAFT FEMORAL-POPLITEAL ARTERY;  Surgeon: Sherren Kerns, MD;  Location: [facility] OR;  Service: Vascular;  Laterality:  Right;  Vein Patch Angionplasty and Thrombectomy    GASTRIC ROUX-EN-Y N/A 09/02/2017   Procedure: LAPAROSCOPIC ROUX-EN-Y GASTRIC BYPASS WITH UPPER ENDOSCOPY AND ERAS PATHWAY;  Surgeon: Kinsinger, De Blanch, MD;  Location: [facility] ORS;  Service: General;  Laterality: N/A;   INTRAOPERATIVE ARTERIOGRAM  08/29/2011   Procedure: INTRA OPERATIVE ARTERIOGRAM;  Surgeon: Sherren Kerns, MD;  Location: [facility] OR;  Service: Vascular;  Laterality: Right;   LOWER EXTREMITY ANGIOGRAM N/A 08/10/2011   Procedure: LOWER EXTREMITY ANGIOGRAM;  Surgeon: Sherren Kerns, MD;  Location: [facility] CATH LAB;  Service: Cardiovascular;  Laterality: N/A;   THYROIDECTOMY N/A 05/29/2019   Procedure: TOTAL THYROIDECTOMY;  Surgeon: Darnell Level, MD;  Location: [facility] ORS;  Service: General;  Laterality: N/A;   TOE AMPUTATION  07/12/2008    left foot toes 1,2,3   TUBAL LIGATION     VASCULAR SURGERY      Allergies  Allergen Reactions   Amoxicillin-Pot Clavulanate Shortness Of Breath    Has patient had a PCN reaction causing immediate rash, facial/tongue/throat swelling, SOB or lightheadedness with hypotension: Yes Has patient had a PCN reaction causing severe rash involving mucus membranes or skin necrosis: No Has patient had a PCN reaction that required hospitalization: Yes Has patient had a PCN reaction occurring within the last 10 years: No If all of the above answers are "NO", then may proceed with Cephalosporin use.    Codeine Anaphylaxis   Darvocet [Propoxyphene N-Acetaminophen] Anaphylaxis    Plain Tylenol ok   Oxycodone Anaphylaxis   Statins Other (See Comments)    Joint pain   Propoxyphene Swelling    Current Outpatient Medications  Medication Sig Dispense Refill   acetaminophen (TYLENOL) 325 MG tablet Take 2 tablets (650 mg total) by mouth every 6 (six) hours as needed for mild pain (or Fever >/= 101).     ALPRAZolam (XANAX) 1 MG tablet Take by mouth.     calcium-vitamin D (OSCAL WITH D) 500-200 MG-UNIT TABS tablet Oyster  Shell Calcium-Vitamin D3 500 mg-5 mcg (200 unit) tablet  TAKE 2 TABLETS BY MOUTH 3 TIMES DAILY WITH MEALS     clobetasol ointment (TEMOVATE) 0.05 % Apply topically 2 (two) times daily. 30 g 0   ergocalciferol (VITAMIN D2) 1.25 MG (50000 UT) capsule 1 capsule     hydrochlorothiazide (HYDRODIURIL) 25 MG tablet Take 1 tablet (25 mg total) by mouth in the  morning. 30 tablet 0   levothyroxine (SYNTHROID) 150 MCG tablet Take 1 tablet (150 mcg total) by mouth daily before breakfast. 30 tablet 0   losartan (COZAAR) 100 MG tablet Take 1 tablet (100 mg total) by mouth daily. 30 tablet 0   metFORMIN (GLUCOPHAGE) 1000 MG tablet Take 1 tablet (1,000 mg total) by mouth 2 (two) times daily with a meal. 30 tablet 0   metroNIDAZOLE (FLAGYL) 500 MG tablet Take 1 tablet (500 mg total) by mouth 2 (two) times daily. 14 tablet 0   nitrofurantoin, macrocrystal-monohydrate, (MACROBID) 100 MG capsule Take 1 capsule (100 mg total) by mouth 2 (two) times daily. 10 capsule 0   nystatin powder Apply 1 application topically 3 (three) times daily. 15 g 0   nystatin-triamcinolone (MYCOLOG II) cream nystatin-triamcinolone 100,000 unit/g-0.1 % topical cream  APPLY TOPICALLY TO THE AFFECTED AREA(S) 2 TIMES PER DAY IN THE MORNING AND EVENING     Semaglutide, 1 MG/DOSE, (OZEMPIC, 1 MG/DOSE,) 4 MG/3ML SOPN Ozempic 1 mg/dose (4 mg/3 mL) subcutaneous pen injector     No current facility-administered medications for this visit.    Family History  Problem Relation Age of Onset   Diabetes Mother    Hyperlipidemia Mother    Hypertension Mother    Diabetes Father    Heart disease Father        before age 50   Hypertension Father    Hyperlipidemia Father    Hypertension Sister    Diabetes Brother    Glaucoma Paternal Grandfather     Social History   Socioeconomic History   Marital status: Divorced    Spouse name: Not on file   Number of children: Not on file   Years of education: Not on file   Highest education level: Not  on file  Occupational History   Not on file  Tobacco Use   Smoking status: Former    Packs/day: 1.00    Years: 10.00    Additional pack years: 0.00    Total pack years: 10.00    Types: Cigarettes    Quit date: 07/25/2008    Years since quitting: 14.0   Smokeless tobacco: Never   Tobacco comments:    quit 60yrs ago  Vaping Use   Vaping Use: Never used  Substance and Sexual Activity   Alcohol use: No   Drug use: No   Sexual activity: Not Currently  Other Topics Concern   Not on file  Social History Narrative   Not on file   Social Determinants of Health   Financial Resource Strain: Not on file  Food Insecurity: Not on file  Transportation Needs: Not on file  Physical Activity: Not on file  Stress: Not on file  Social Connections: Not on file  Intimate Partner Violence: Not on file     REVIEW OF SYSTEMS:   [X]  denotes positive finding, [ ]  denotes negative finding Cardiac  Comments:  Chest pain or chest pressure:    Shortness of breath upon exertion:    Short of breath when lying flat:    Irregular heart rhythm:        Vascular    Pain in calf, thigh, or hip brought on by ambulation:    Pain in feet at night that wakes you up from your sleep:     Blood clot in your veins:    Leg swelling:         Pulmonary    Oxygen at home:    Productive cough:  Wheezing:         Neurologic    Sudden weakness in arms or legs:     Sudden numbness in arms or legs:     Sudden onset of difficulty speaking or slurred speech:    Temporary loss of vision in one eye:     Problems with dizziness:         Gastrointestinal    Blood in stool:     Vomited blood:         Genitourinary    Burning when urinating:     Blood in urine:        Psychiatric    Major depression:  x       Hematologic    Bleeding problems:    Problems with blood clotting too easily:        Skin    Rashes or ulcers:        Constitutional    Fever or chills:      PHYSICAL  EXAMINATION:  Today's Vitals   07/31/22 1426 07/31/22 1429  BP: (!) 88/58   Pulse: 88   Resp: 18   Temp: 97.7 F (36.5 C)   TempSrc: Temporal   SpO2: 98%   Weight: 191 lb 12.8 oz (87 kg)   Height: 5\' 7"  (1.702 m)   PainSc: 5  6   PainLoc: Toe    Body mass index is 30.04 kg/m.   General:  WDWN in NAD; vital signs documented above Gait: Not observed HENT: WNL, normocephalic Pulmonary: normal non-labored breathing , without wheezing Cardiac: regular HR, without carotid bruits Abdomen: soft, NT; aortic pulse is not palpable Skin: without rashes Vascular Exam/Pulses:  Right Left  Radial 2+ (normal) 2+ (normal)  Femoral 2+ (normal) 2+ (normal)  DP 2+ (normal) Brisk biphasic  PT Brisk biphasic Brisk mibphasic   Extremities: without ischemic changes, without Gangrene , without cellulitis; without open wounds Musculoskeletal: no muscle wasting or atrophy  Neurologic: A&O X 3 Psychiatric:  The pt has Normal affect.   Non-Invasive Vascular Imaging:   ABI's/TBI's on 07/31/2022: Right:  0.92/0.54 - Great toe pressure: 59 Left:  0.83/amp - Great toe pressure: amp  Arterial duplex on 07/31/2022: Right Graft #1: Fem-pop  +------------------+--------+--------+---------+--------+                   PSV cm/sStenosisWaveform Comments  +------------------+--------+--------+---------+--------+  Inflow           148             triphasic          +------------------+--------+--------+---------+--------+  Prox Anastomosis  133             biphasic           +------------------+--------+--------+---------+--------+  Proximal Graft    68              triphasic          +------------------+--------+--------+---------+--------+  Mid Graft         81              triphasic          +------------------+--------+--------+---------+--------+  Distal Graft      83              triphasic          +------------------+--------+--------+---------+--------+   Distal Anastomosis92              triphasic          +------------------+--------+--------+---------+--------+  Outflow          68              triphasic          +------------------+--------+--------+---------+--------+   Left Graft #1: fem-pop  +--------------------+--------+---------------+---------+--------+                     PSV cm/sStenosis       Waveform Comments  +--------------------+--------+---------------+---------+--------+  Inflow             190                    biphasic           +--------------------+--------+---------------+---------+--------+  Proximal Anastomosis217     50-70% stenosistriphasic          +--------------------+--------+---------------+---------+--------+  Proximal Graft      53                     triphasic          +--------------------+--------+---------------+---------+--------+  Mid Graft           62                     triphasic          +--------------------+--------+---------------+---------+--------+  Distal Graft        62                     triphasic          +--------------------+--------+---------------+---------+--------+  Distal Anastomosis  85                     triphasic          +--------------------+--------+---------------+---------+--------+  Outflow            70                     triphasic          +--------------------+--------+---------------+---------+--------+   Summary:  Right: Patent graft without evidence of stenosis.   Left: 50-70% stenosis noted in the proximal anastomosis, otherwise graft  appears patent.   Previous ABI's/TBI's on 07/06/2021: Right:  1.14/0.85 - Great toe pressure: 142 Left:  1.10//amp - Great toe pressure:  amp  Previous arterial duplex on 07/06/2021: +----------+--------+-----+--------+---------+--------+  RIGHT    PSV cm/sRatioStenosisWaveform Comments  +----------+--------+-----+--------+---------+--------+  CFA  Distal111                  triphasic          +----------+--------+-----+--------+---------+--------+   Right Graft #1: Fem-pop  +------------------+--------+--------+---------+--------+                   PSV cm/sStenosisWaveform Comments  +------------------+--------+--------+---------+--------+  Inflow           125             biphasic           +------------------+--------+--------+---------+--------+  Prox Anastomosis  116             biphasic           +------------------+--------+--------+---------+--------+  Proximal Graft    76              biphasic           +------------------+--------+--------+---------+--------+  Mid Graft         92  triphasic          +------------------+--------+--------+---------+--------+  Distal Graft      121             biphasic           +------------------+--------+--------+---------+--------+  Distal Anastomosis70              biphasic           +------------------+--------+--------+---------+--------+  Outflow          78              biphasic           +------------------+--------+--------+---------+--------+   +----------+--------+-----+--------+---------+--------+  LEFT     PSV cm/sRatioStenosisWaveform Comments  +----------+--------+-----+--------+---------+--------+  CFA Distal144                  triphasic          +----------+--------+-----+--------+---------+--------+  POP Prox  67                                      +----------+--------+-----+--------+---------+--------+   Left Graft #1: Fem-pop  +--------------------+--------+--------+---------+--------+                     PSV cm/sStenosisWaveform Comments  +--------------------+--------+--------+---------+--------+  Inflow             137             triphasic          +--------------------+--------+--------+---------+--------+  Proximal Anastomosis110             biphasic            +--------------------+--------+--------+---------+--------+  Proximal Graft      71              biphasic           +--------------------+--------+--------+---------+--------+  Mid Graft           86              biphasic           +--------------------+--------+--------+---------+--------+  Distal Graft        71              biphasic           +--------------------+--------+--------+---------+--------+  Distal Anastomosis  78              biphasic           +--------------------+--------+--------+---------+--------+  Outflow            76              biphasic           +--------------------+--------+--------+---------+--------+   Summary:  Right: Patent fem-pop bypass graft with no evidence of stenosis.  Left: Patent fem-pop bypass graft with no evidence of stenosis.     ASSESSMENT/PLAN:: 57 y.o. female here for follow up for PAD with hx of left CFA endarterectomy and left femoral to AK popliteal bypass with GSV by Dr. Darrick Penna on 07/05/2010.  She subsequently underwent amputations of toes 1, 2, and 3 on the left foot on 07/13/2010 also by Dr. Darrick Penna.  She underwent right femoral to AK popliteal bypass with GSV on 08/27/2011 also by Dr. Darrick Penna for rest pain.  On 08/29/2011, her RLE bypass thrombosed and she underwent thrombectomy of RLE bypass also by Dr. Darrick Penna. On 01/07/2012, she underwent amputation of right 4th  and 5th toes also by Dr. Darrick Penna.    -pt BLE arterial duplex reveal patent bypass bilaterally.  She does have a mild stenosis at the proximal anastomosis on the left.  She has biphasic flow in both feet and the right DP is palpable. -she recently started Lipitor and is now having significant myalgias.  She states she did tolerate the Crestor.  She is going to speak with her PCP about switching back to Crestor.  I also discussed with her that she should take an EC baby aspirin every day.   -pt will f/u in 6 months with repeat ABI and bilateral lower  extremity duplexes given stenosis on the left. -she knows to call sooner if she has any issues before then.    Doreatha Massed, [facility] [phone]  Clinic MD:   Chestine Spore

## 2022-08-06 ENCOUNTER — Other Ambulatory Visit: Payer: Self-pay

## 2022-08-06 DIAGNOSIS — I739 Peripheral vascular disease, unspecified: Secondary | ICD-10-CM

## 2022-11-30 ENCOUNTER — Ambulatory Visit (HOSPITAL_COMMUNITY)
Admission: EM | Admit: 2022-11-30 | Discharge: 2022-11-30 | Disposition: A | Discharge status: Home / Self Care | Payer: Medicare HMO | Attending: Psychiatry | Admitting: Psychiatry

## 2022-11-30 DIAGNOSIS — F4310 Post-traumatic stress disorder, unspecified: Secondary | ICD-10-CM | POA: Diagnosis not present

## 2022-11-30 DIAGNOSIS — F332 Major depressive disorder, recurrent severe without psychotic features: Secondary | ICD-10-CM | POA: Diagnosis present

## 2022-11-30 DIAGNOSIS — Z62810 Personal history of physical and sexual abuse in childhood: Secondary | ICD-10-CM | POA: Diagnosis not present

## 2022-11-30 DIAGNOSIS — F419 Anxiety disorder, unspecified: Secondary | ICD-10-CM | POA: Diagnosis not present

## 2022-11-30 NOTE — Progress Notes (Signed)
   11/30/22 1458  [facility] Triage Screening (Walk-ins at [facility] only)  How Did You Hear About [place]? Family/Friend  What Is the Reason for Your Visit/Call Today? Kelly Barber is a 57 year old female that presents to [facility] accompanied by her son. Pt is diagnosed with PTSD, Bipolar Disorder and Anxiety. Pt is currently taking medication to help with her diagnosis, but feels like it is not meeting her needs. Pt reports she has not been able to sleep for the past few days. Pt is feeling like she is not herself and cannot work through her PTSD. Pt states, "I have a medication problem and I do not know what to do and I feel on edge." Pts next doctor appointment is not until 10/8. The pt is requesting that she needs a different medication before her 10/8 appointment. Pt has seen a therapist before, but is looking to find a new one at this time to help with her ongoing trauma. Pt denies substance use, SI, HI and AVH.  How Long Has This Been Causing You Problems? > than 6 months  Have You Recently Had Any Thoughts About Hurting Yourself? No  Are You Planning to Commit Suicide/Harm Yourself At This time? No  Have you Recently Had Thoughts About Hurting Someone Kelly Barber? No  Are You Planning To Harm Someone At This Time? No  Are you currently experiencing any auditory, visual or other hallucinations? No  Have You Used Any Alcohol or Drugs in the Past 24 Hours? No  Do you have any current medical co-morbidities that require immediate attention? No  Clinician description of patient physical appearance/behavior: tearful, cooperative  What Do You Feel Would Help You the Most Today? Medication(s)  If access to [facility] was not available, would you have sought care in the Emergency Department? No  Determination of Need Routine (7 days)  Options For Referral Intensive Outpatient Therapy
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
## 2022-11-30 NOTE — ED Notes (Signed)
Pt discharged with  AVS.  AVS reviewed prior to discharge.  Pt alert, oriented, and ambulatory.  Safety maintained.  °

## 2023-01-07 ENCOUNTER — Ambulatory Visit
Admission: EM | Admit: 2023-01-07 | Discharge: 2023-01-07 | Disposition: A | Discharge status: Home / Self Care | Payer: Medicare HMO | Attending: Physician Assistant | Admitting: Physician Assistant

## 2023-01-07 ENCOUNTER — Telehealth: Payer: Self-pay

## 2023-01-07 DIAGNOSIS — E0590 Thyrotoxicosis, unspecified without thyrotoxic crisis or storm: Secondary | ICD-10-CM | POA: Insufficient documentation

## 2023-01-07 DIAGNOSIS — E669 Obesity, unspecified: Secondary | ICD-10-CM | POA: Insufficient documentation

## 2023-01-07 DIAGNOSIS — E010 Iodine-deficiency related diffuse (endemic) goiter: Secondary | ICD-10-CM | POA: Insufficient documentation

## 2023-01-07 DIAGNOSIS — F4310 Post-traumatic stress disorder, unspecified: Secondary | ICD-10-CM | POA: Insufficient documentation

## 2023-01-07 DIAGNOSIS — E785 Hyperlipidemia, unspecified: Secondary | ICD-10-CM | POA: Insufficient documentation

## 2023-01-07 DIAGNOSIS — E1142 Type 2 diabetes mellitus with diabetic polyneuropathy: Secondary | ICD-10-CM | POA: Insufficient documentation

## 2023-01-07 DIAGNOSIS — Z6831 Body mass index (BMI) 31.0-31.9, adult: Secondary | ICD-10-CM | POA: Insufficient documentation

## 2023-01-07 DIAGNOSIS — H00011 Hordeolum externum right upper eyelid: Secondary | ICD-10-CM | POA: Diagnosis not present

## 2023-01-07 DIAGNOSIS — R809 Proteinuria, unspecified: Secondary | ICD-10-CM | POA: Insufficient documentation

## 2023-01-07 DIAGNOSIS — E890 Postprocedural hypothyroidism: Secondary | ICD-10-CM | POA: Insufficient documentation

## 2023-01-07 DIAGNOSIS — I739 Peripheral vascular disease, unspecified: Secondary | ICD-10-CM | POA: Insufficient documentation

## 2023-01-07 DIAGNOSIS — E559 Vitamin D deficiency, unspecified: Secondary | ICD-10-CM | POA: Insufficient documentation

## 2023-01-07 DIAGNOSIS — M797 Fibromyalgia: Secondary | ICD-10-CM | POA: Insufficient documentation

## 2023-01-07 DIAGNOSIS — Z8639 Personal history of other endocrine, nutritional and metabolic disease: Secondary | ICD-10-CM | POA: Insufficient documentation

## 2023-01-07 DIAGNOSIS — E042 Nontoxic multinodular goiter: Secondary | ICD-10-CM | POA: Insufficient documentation

## 2023-01-07 DIAGNOSIS — N3000 Acute cystitis without hematuria: Secondary | ICD-10-CM | POA: Diagnosis not present

## 2023-01-07 DIAGNOSIS — E119 Type 2 diabetes mellitus without complications: Secondary | ICD-10-CM | POA: Insufficient documentation

## 2023-01-07 DIAGNOSIS — E11319 Type 2 diabetes mellitus with unspecified diabetic retinopathy without macular edema: Secondary | ICD-10-CM | POA: Insufficient documentation

## 2023-01-07 DIAGNOSIS — F988 Other specified behavioral and emotional disorders with onset usually occurring in childhood and adolescence: Secondary | ICD-10-CM | POA: Insufficient documentation

## 2023-01-07 DIAGNOSIS — F419 Anxiety disorder, unspecified: Secondary | ICD-10-CM | POA: Insufficient documentation

## 2023-01-07 DIAGNOSIS — E039 Hypothyroidism, unspecified: Secondary | ICD-10-CM | POA: Insufficient documentation

## 2023-01-07 DIAGNOSIS — Z6830 Body mass index (BMI) 30.0-30.9, adult: Secondary | ICD-10-CM | POA: Insufficient documentation

## 2023-01-07 LAB — POCT URINALYSIS DIP (MANUAL ENTRY)
Bilirubin, UA: NEGATIVE
Blood, UA: NEGATIVE
Glucose, UA: NEGATIVE mg/dL
Ketones, POC UA: NEGATIVE mg/dL
Leukocytes, UA: NEGATIVE
Nitrite, UA: POSITIVE — AB
Protein Ur, POC: NEGATIVE mg/dL
Spec Grav, UA: 1.02 (ref 1.010–1.025)
Urobilinogen, UA: 0.2 U/dL
pH, UA: 5.5 (ref 5.0–8.0)

## 2023-01-07 MED ORDER — ERYTHROMYCIN 5 MG/GM OP OINT: TOPICAL_OINTMENT | OPHTHALMIC | 0 refills | Status: DC

## 2023-01-07 MED ORDER — FLUCONAZOLE 150 MG PO TABS: ORAL_TABLET | ORAL | 0 refills | Status: DC

## 2023-01-07 MED ORDER — CIPROFLOXACIN HCL 500 MG PO TABS: 500.0000 mg | ORAL_TABLET | Freq: Two times a day (BID) | ORAL | 0 refills | Status: DC

## 2023-01-07 NOTE — Telephone Encounter (Signed)
Pharmacy calling to confirm Erythromycin Oint directions. Twice daily for 7 days per Provider.  B. Roten CMA

## 2023-01-07 NOTE — ED Triage Notes (Signed)
"  I have a stye on the upper eyelid (right eye), first noticed over 6 days ago". No injury. No drainage. No fever.  "I also think I have UTI, I feel a tight sensation after peeing". No discharge. No concern for UTI. No fever.

## 2023-01-07 NOTE — ED Provider Notes (Signed)
Kelly Barber    CSN: [id number] Arrival date & time: 01/07/23  1344      History   Chief Complaint Chief Complaint  Patient presents with   Eye Problem   UTI Symptoms    HPI Kelly Barber is a 57 y.o. female.   Patient here today for evaluation of swelling to her upper right abdomen that started about 6 days ago.  She denies any injury.  She has not any drainage.  She denies any fever.  She reports she thinks she has a stye.  She also reports possible UTI.  She reports that she has a "tight sensation" after urination.  She denies any vaginal discharge.  She has not had any vomiting or abdominal pain.  The history is provided by the patient.    Past Medical History:  Diagnosis Date   Anemia    Anxiety    takes xanax daily   Anxiety disorder    Cataract    Mixed form OU   Complication of anesthesia    Depression, major    takes wellbutrin and celexa daily   Diabetes mellitus    type 2    Diabetic retinopathy (HCC)    NPDR OU   Fibromyalgia    GERD (gastroesophageal reflux disease)    doesn't take any meds   H/O hiatal hernia    Hyperlipidemia    Hypertension    takes Lisinopril and Diovan daily   Hypertensive retinopathy    OU   Hyperthyroidism    yrs ago but doesn't require meds for this   Immune deficiency disorder (HCC)    pt states she has no clue what this is   Insomnia    Joint pain    Joint swelling    PTSD (post-traumatic stress disorder)    PTSD (post-traumatic stress disorder)    PVD (peripheral vascular disease) (HCC)    Sleep apnea     Patient Active Problem List   Diagnosis Date Noted   ADD (attention deficit disorder) 01/07/2023   Diabetic peripheral neuropathy associated with type 2 diabetes mellitus (HCC) 01/07/2023   Diabetic retinopathy associated with type 2 diabetes mellitus (HCC) 01/07/2023   Fibromyalgia 01/07/2023   History of hyperthyroidism 01/07/2023   History of thyroidectomy 01/07/2023   Hyperlipidemia  01/07/2023   Microalbuminuria 01/07/2023   PTSD (post-traumatic stress disorder) 01/07/2023   Type 2 diabetes mellitus without complications (HCC) 01/07/2023   Vitamin D deficiency 01/07/2023   Anxiety 01/07/2023   Hyperthyroidism 01/07/2023   Hypothyroidism 01/07/2023   Thyromegaly 01/07/2023   Non-toxic multinodular goiter 01/07/2023   Body mass index (BMI) 31.0-31.9, adult 01/07/2023   Body mass index (BMI) 30.0-30.9, adult 01/07/2023   Obesity 01/07/2023   Peripheral vascular disease (HCC) 01/07/2023   Thyroid disorder screening 04/20/2021   Genital herpes simplex 04/14/2020   Pain in joint of right hip 08/27/2019   Pain in joint of left shoulder 08/27/2019   Pain in joint of right shoulder 08/27/2019   Pain of left hip joint 08/27/2019   Substernal thyroid goiter 05/26/2019   Subclinical hyperthyroidism 05/26/2019   Anemia 12/09/2018   Diabetes mellitus (HCC) 12/09/2018   Disorder of thyroid gland 12/09/2018   Benign hypertension 12/09/2018   Morbid obesity (HCC) 09/02/2017   Glaucoma 06/15/2014   S/P amputation of foot (HCC) 06/14/2014   Depression 06/14/2014   Diabetic neuropathy (HCC) 06/14/2014   Boil, axilla 08/10/2012   Goiter 05/04/2012   Aftercare following surgery of the circulatory system, NEC  12/20/2011   Atherosclerosis of native arteries of the extremities with ulceration (HCC) 12/20/2011   Preop cardiovascular exam 08/16/2011   Atherosclerosis of native artery of extremity with intermittent claudication (HCC) 07/26/2011   CELLULITIS AND ABSCESS OF TRUNK 08/09/2008   LOWER LIMB AMPUTATION, OTHER TOE 08/03/2008   PERIPHERAL VASCULAR DISEASE 04/29/2008   LEUKOCYTOCLASTIC VASCULITIS 04/28/2008   Other nonthrombocytopenic purpura (HCC) 04/21/2008   GERD 02/23/2008   GOITER, MULTINODULAR 12/19/2006   Diabetes (HCC) 12/19/2006   Anxiety state 12/19/2006   DEPRESSION 12/19/2006   Essential hypertension 12/19/2006   Calculus of gallbladder 12/19/2006    FIBROMYALGIA 12/19/2006   WEIGHT GAIN 12/19/2006   CHEST PAIN, NON-CARDIAC 12/19/2006    Past Surgical History:  Procedure Laterality Date   ABDOMINAL AORTAGRAM N/A 08/10/2011   Procedure: ABDOMINAL Ronny Flurry;  Surgeon: Sherren Kerns, MD;  Location: [facility] CATH LAB;  Service: Cardiovascular;  Laterality: N/A;   AMPUTATION     lft foot toes 1.2.3   AMPUTATION  01/07/2012   Procedure: AMPUTATION RAY;  Surgeon: Sherren Kerns, MD;  Location: [facility] OR;  Service: Vascular;  Laterality: Right;  4TH & 5TH toes   aortogram     CHOLECYSTECTOMY  1990   COLONOSCOPY     DILATION AND CURETTAGE OF UTERUS     FEMORAL BYPASS  04/29/2008   left   FEMORAL-POPLITEAL BYPASS GRAFT  08/27/2011   Procedure: BYPASS GRAFT FEMORAL-POPLITEAL ARTERY;  Surgeon: Sherren Kerns, MD;  Location: [facility] OR;  Service: Vascular;  Laterality: Right;   FEMORAL-POPLITEAL BYPASS GRAFT  08/29/2011   Procedure: BYPASS GRAFT FEMORAL-POPLITEAL ARTERY;  Surgeon: Sherren Kerns, MD;  Location: [facility] OR;  Service: Vascular;  Laterality: Right;  Vein Patch Angionplasty and Thrombectomy    GASTRIC ROUX-EN-Y N/A 09/02/2017   Procedure: LAPAROSCOPIC ROUX-EN-Y GASTRIC BYPASS WITH UPPER ENDOSCOPY AND ERAS PATHWAY;  Surgeon: Kinsinger, De Blanch, MD;  Location: [facility] ORS;  Service: General;  Laterality: N/A;   INTRAOPERATIVE ARTERIOGRAM  08/29/2011   Procedure: INTRA OPERATIVE ARTERIOGRAM;  Surgeon: Sherren Kerns, MD;  Location: [facility] OR;  Service: Vascular;  Laterality: Right;   LOWER EXTREMITY ANGIOGRAM N/A 08/10/2011   Procedure: LOWER EXTREMITY ANGIOGRAM;  Surgeon: Sherren Kerns, MD;  Location: [facility] CATH LAB;  Service: Cardiovascular;  Laterality: N/A;   THYROIDECTOMY N/A 05/29/2019   Procedure: TOTAL THYROIDECTOMY;  Surgeon: Darnell Level, MD;  Location: [facility] ORS;  Service: General;  Laterality: N/A;   TOE AMPUTATION  07/12/2008    left foot toes 1,2,3   TUBAL LIGATION     VASCULAR SURGERY      OB History   No obstetric history on file.       Home Medications    Prior to Admission medications   Medication Sig Start Date End Date Taking? Authorizing Provider  ciprofloxacin (CIPRO) 500 MG tablet Take 1 tablet (500 mg total) by mouth every 12 (twelve) hours. 01/07/23  Yes Tomi Bamberger, PA-C  erythromycin ophthalmic ointment Place a 1/2 inch ribbon of ointment into the lower eyelid. 01/07/23  Yes Tomi Bamberger, PA-C  fluconazole (DIFLUCAN) 150 MG tablet Take one tab PO today and repeat dose in 3 days if symptoms persist 01/07/23  Yes Tomi Bamberger, PA-C  ACCU-CHEK GUIDE test strip 1 each by Other route as needed for other (Glucose). 07/26/22   [provider]  Accu-Chek Softclix Lancets lancets daily. 07/26/22   [provider]  acetaminophen (TYLENOL) 325 MG tablet Take 2 tablets (650 mg total) by mouth every  6 (six) hours as needed for mild pain (or Fever >/= 101). 05/30/19   Berna Bue, MD  ALPRAZolam Prudy Feeler) 1 MG tablet Take by mouth. 05/24/20   [provider]  amphetamine-dextroamphetamine (ADDERALL) 20 MG tablet Take 20 mg by mouth as directed.    [provider]  atorvastatin (LIPITOR) 40 MG tablet Take 40 mg by mouth. 12/10/22   [provider]  calcium-vitamin D (OSCAL WITH D) 500-200 MG-UNIT TABS tablet Oyster Shell Calcium-Vitamin D3 500 mg-5 mcg (200 unit) tablet  TAKE 2 TABLETS BY MOUTH 3 TIMES DAILY WITH MEALS    [provider]  clobetasol ointment (TEMOVATE) 0.05 % Apply topically 2 (two) times daily. 03/26/22   Lurline Idol, FNP  clonazePAM (KLONOPIN) 1 MG tablet Take 1 tablet by mouth 3 (three) times daily.    [provider]  clopidogrel (PLAVIX) 75 MG tablet Take 1 tablet by mouth daily.    [provider]  cyclobenzaprine (FLEXERIL) 10 MG tablet Take 1 tablet by mouth 3 (three) times daily as needed.    [provider]  desvenlafaxine (PRISTIQ) 100 MG 24 hr tablet Take 1 tablet by mouth every morning.    [provider]  Dulaglutide (TRULICITY) 0.75 MG/0.5ML SOAJ 0.75 mg.    [provider]  DULoxetine (CYMBALTA) 30 MG capsule Take 1 tablet by mouth daily.    [provider]  DULoxetine (CYMBALTA) 60 MG capsule Take 2 capsules by mouth daily.    [provider]  DULoxetine (CYMBALTA) 60 MG capsule Take 60 mg by mouth daily. 11/12/22   [provider]  ergocalciferol (VITAMIN D2) 1.25 MG (50000 UT) capsule 1 capsule    [provider]  ergocalciferol (VITAMIN D2) 1.25 MG (50000 UT) capsule Take 50,000 Units by mouth once a week.    [provider]  gabapentin (NEURONTIN) 100 MG capsule Take 100 mg by mouth 2 (two) times daily. 12/25/22   [provider]  gabapentin (NEURONTIN) 300 MG capsule Take 300 mg by mouth 2 (two) times daily.    [provider]  hydrochlorothiazide (HYDRODIURIL) 25 MG tablet Take 1 tablet (25 mg total) by mouth in the morning. 03/26/22   Lurline Idol, FNP  levothyroxine (SYNTHROID) 150 MCG tablet Take 1 tablet (150 mcg total) by mouth daily before breakfast. 03/26/22   Lurline Idol, FNP  losartan (COZAAR) 100 MG tablet Take 1 tablet (100 mg total) by mouth daily. 03/26/22   Lurline Idol, FNP  losartan-hydrochlorothiazide (HYZAAR) 100-25 MG tablet Take 1 tablet by mouth daily. 11/12/22   [provider]  metFORMIN (GLUCOPHAGE) 1000 MG tablet Take 1 tablet (1,000 mg total) by mouth 2 (two) times daily with a meal. 03/26/22   Lurline Idol, FNP  methimazole (TAPAZOLE) 5 MG tablet Take 5 mg by mouth as directed.  TAKE 1/2 TABLET BY MOUTH 3 TIMES WEEKLY    [provider]  Na Sulfate-K Sulfate-Mg Sulf 17.5-3.13-1.6 GM/177ML SOLN See admin instructions.    [provider]  nystatin powder Apply 1 application topically 3 (three) times daily. 12/29/20   Mound, Acie Fredrickson, FNP  nystatin-triamcinolone (MYCOLOG II) cream nystatin-triamcinolone 100,000 unit/g-0.1 % topical cream   APPLY TOPICALLY TO THE AFFECTED AREA(S) 2 TIMES PER DAY IN THE MORNING AND EVENING    [provider]  pregabalin (LYRICA) 150 MG capsule Take 1 capsule by mouth 2 (two) times daily.    [provider]  pregabalin (LYRICA) 75 MG capsule Take 1 capsule by mouth  2 (two) times daily.    [provider]  rosuvastatin (CRESTOR) 10 MG tablet Take 10 mg by mouth daily. 04/25/21   [provider]  Semaglutide, 1 MG/DOSE, (OZEMPIC, 1 MG/DOSE,) 4 MG/3ML SOPN Ozempic 1 mg/dose (4 mg/3 mL) subcutaneous pen injector    [provider]  topiramate (TOPAMAX) 50 MG tablet Take 1 tablet by mouth at bedtime. 06/29/21   [provider]  traMADol (ULTRAM) 50 MG tablet Take 50 mg by mouth every 6 (six) hours as needed for severe pain (pain score 7-10).    [provider]  traZODone (DESYREL) 100 MG tablet Take 200-300 mg by mouth at bedtime. 11/14/22   [provider]  traZODone (DESYREL) 50 MG tablet Take 50-150 mg by mouth at bedtime as needed.    [provider]    Family History Family History  Problem Relation Age of Onset   Diabetes Mother    Hyperlipidemia Mother    Hypertension Mother    Diabetes Father    Heart disease Father        before age 51   Hypertension Father    Hyperlipidemia Father    Hypertension Sister    Diabetes Brother    Glaucoma Paternal Grandfather     Social History Social History   Tobacco Use   Smoking status: Former    Current packs/day: 0.00    Average packs/day: 1 pack/day for 10.0 years (10.0 ttl pk-yrs)    Types: Cigarettes    Start date: 07/26/1998    Quit date: 07/25/2008    Years since quitting: 14.4   Smokeless tobacco: Never   Tobacco comments:    quit 43yrs ago  Vaping Use   Vaping status: Never Used  Substance Use Topics   Alcohol use: No   Drug use: No     Allergies   Amoxicillin-pot clavulanate, Codeine, Darvocet [propoxyphene n-acetaminophen], Oxycodone, Propoxyphene,  and Statins   Review of Systems Review of Systems  Constitutional:  Negative for chills and fever.  Eyes:  Negative for photophobia, discharge, redness and visual disturbance.       Positive for right eyelid redness and swelling  Respiratory:  Negative for shortness of breath.   Gastrointestinal:  Negative for abdominal pain, nausea and vomiting.  Genitourinary:  Positive for dysuria. Negative for vaginal discharge.     Physical Exam Triage Vital Signs ED Triage Vitals  Encounter Vitals Group     BP 01/07/23 1404 123/75     Systolic BP Percentile --      Diastolic BP Percentile --      Pulse Rate 01/07/23 1404 82     Resp 01/07/23 1404 18     Temp 01/07/23 1404 97.9 F (36.6 C)     Temp Source 01/07/23 1404 Oral     SpO2 01/07/23 1404 97 %     Weight 01/07/23 1402 194 lb (88 kg)     Height 01/07/23 1402 5\' 7"  (1.702 m)     Head Circumference --      Peak Flow --      Pain Score 01/07/23 1359 0     Pain Loc --      Pain Education --      Exclude from Growth Chart --    No data found.  Updated Vital Signs BP 123/75 (BP Location: Left Arm)   Pulse 82   Temp 97.9 F (36.6 C) (Oral)   Resp 18   Ht 5\' 7"  (1.702 m)  Wt 194 lb (88 kg)   LMP 03/05/2016 Comment: tubal ligation 2011  SpO2 97%   BMI 30.38 kg/m   Visual Acuity Right Eye Distance:   Left Eye Distance:   Bilateral Distance:    Right Eye Near:   Left Eye Near:    Bilateral Near:     Physical Exam Vitals and nursing note reviewed.  Constitutional:      General: She is not in acute distress.    Appearance: Normal appearance. She is not ill-appearing.  HENT:     Head: Normocephalic and atraumatic.  Eyes:     Extraocular Movements: Extraocular movements intact.     Conjunctiva/sclera: Conjunctivae normal.     Pupils: Pupils are equal, round, and reactive to light.     Comments: mild swelling appreciated to right upper eyelid with lesion consistent with hordeolum g  Cardiovascular:     Rate and  Rhythm: Normal rate.  Pulmonary:     Effort: Pulmonary effort is normal. No respiratory distress.  Neurological:     Mental Status: She is alert.  Psychiatric:        Mood and Affect: Mood normal.        Behavior: Behavior normal.        Thought Content: Thought content normal.      UC Treatments / Results  Labs (all labs ordered are listed, but only abnormal results are displayed) Labs Reviewed  POCT URINALYSIS DIP (MANUAL ENTRY) - Abnormal; Notable for the following components:      Result Value   Color, UA light yellow (*)    Clarity, UA cloudy (*)    Nitrite, UA Positive (*)    All other components within normal limits  URINE CULTURE    EKG   Radiology No results found.  Procedures Procedures (including critical Barber time)  Medications Ordered in UC Medications - No data to display  Initial Impression / Assessment and Plan / UC Course  I have reviewed the triage vital signs and the nursing notes.  Pertinent labs & imaging results that were available during my Barber of the patient were reviewed by me and considered in my medical decision making (see chart for details).    Will treat to cover both UTI and stye.  Cipro prescribed for UTI as well as Diflucan at patient's request as she is concerned for possible yeast infection after antibiotic treatment.  Urine culture ordered.  Erythromycin ointment prescribed to cover stye and recommended alternating warm and cold compresses.  Advised follow-up if any symptoms do not improve or worsen.  Final Clinical Impressions(s) / UC Diagnoses   Final diagnoses:  Hordeolum externum of right upper eyelid  Acute cystitis without hematuria   Discharge Instructions   None    ED Prescriptions     Medication Sig Dispense Auth. Provider   erythromycin ophthalmic ointment Place a 1/2 inch ribbon of ointment into the lower eyelid. 3.5 g Tomi Bamberger, PA-C   ciprofloxacin (CIPRO) 500 MG tablet Take 1 tablet (500 mg total) by  mouth every 12 (twelve) hours. 10 tablet Erma Pinto F, PA-C   fluconazole (DIFLUCAN) 150 MG tablet Take one tab PO today and repeat dose in 3 days if symptoms persist 2 tablet Tomi Bamberger, PA-C      PDMP not reviewed this encounter.   Tomi Bamberger, PA-C 01/30/23 [phone]

## 2023-01-08 ENCOUNTER — Telehealth: Payer: Self-pay | Admitting: Emergency Medicine

## 2023-01-08 NOTE — Telephone Encounter (Signed)
Attempted to call patient for recollection of urine sample for culture; LVMM

## 2023-01-28 NOTE — Progress Notes (Deleted)
HISTORY AND PHYSICAL     CC:  follow up. Requesting Provider:  Zachery Dauer, FNP  HPI: This is a 57 y.o. female who is here today for follow up for PAD.  Pt has hx of  left CFA endarterectomy and left femoral to AK popliteal bypass with GSV by Dr. Darrick Penna on 07/05/2010.  She subsequently underwent amputations of toes 1, 2, and 3 on the left foot on 07/13/2010 also by Dr. Darrick Penna.  She underwent right femoral to AK popliteal bypass with GSV on 08/27/2011 also by Dr. Darrick Penna for rest pain.  On 08/29/2011, her RLE bypass thrombosed and she underwent thrombectomy of RLE bypass also by Dr. Darrick Penna. On 01/07/2012, she underwent amputation of right 4th and 5th toes also by Dr. Darrick Penna.   Pt was last seen 07/31/2022 and at that time, she was having some neuropathy and pain in the amputated toes at night and her gabapentin had recently been increased.  She was having claudication with walking about a block.  She was not smoking.    She did have bariatric surgery and has lost about 130 lbs. She has hx of thyroid cancer with thyroidectomy in 2020.   The pt returns today for follow up.  ***  The pt is on a statin for cholesterol management.    The pt is not on an aspirin.    Other AC:  plavix The pt is on ARB for hypertension.  The pt is  on medication for diabetes. Tobacco hx:  former  Pt does not have family hx of AAA.  Past Medical History:  Diagnosis Date   Anemia    Anxiety    takes xanax daily   Anxiety disorder    Cataract    Mixed form OU   Complication of anesthesia    Depression, major    takes wellbutrin and celexa daily   Diabetes mellitus    type 2    Diabetic retinopathy (HCC)    NPDR OU   Fibromyalgia    GERD (gastroesophageal reflux disease)    doesn't take any meds   H/O hiatal hernia    Hyperlipidemia    Hypertension    takes Lisinopril and Diovan daily   Hypertensive retinopathy    OU   Hyperthyroidism    yrs ago but doesn't require meds for this   Immune deficiency  disorder (HCC)    pt states she has no clue what this is   Insomnia    Joint pain    Joint swelling    PTSD (post-traumatic stress disorder)    PTSD (post-traumatic stress disorder)    PVD (peripheral vascular disease) (HCC)    Sleep apnea     Past Surgical History:  Procedure Laterality Date   ABDOMINAL AORTAGRAM N/A 08/10/2011   Procedure: ABDOMINAL Ronny Flurry;  Surgeon: Sherren Kerns, MD;  Location: [facility] CATH LAB;  Service: Cardiovascular;  Laterality: N/A;   AMPUTATION     lft foot toes 1.2.3   AMPUTATION  01/07/2012   Procedure: AMPUTATION RAY;  Surgeon: Sherren Kerns, MD;  Location: [facility] OR;  Service: Vascular;  Laterality: Right;  4TH & 5TH toes   aortogram     CHOLECYSTECTOMY  1990   COLONOSCOPY     DILATION AND CURETTAGE OF UTERUS     FEMORAL BYPASS  04/29/2008   left   FEMORAL-POPLITEAL BYPASS GRAFT  08/27/2011   Procedure: BYPASS GRAFT FEMORAL-POPLITEAL ARTERY;  Surgeon: Sherren Kerns, MD;  Location: [facility] OR;  Service:  Vascular;  Laterality: Right;   FEMORAL-POPLITEAL BYPASS GRAFT  08/29/2011   Procedure: BYPASS GRAFT FEMORAL-POPLITEAL ARTERY;  Surgeon: Sherren Kerns, MD;  Location: [facility] OR;  Service: Vascular;  Laterality: Right;  Vein Patch Angionplasty and Thrombectomy    GASTRIC ROUX-EN-Y N/A 09/02/2017   Procedure: LAPAROSCOPIC ROUX-EN-Y GASTRIC BYPASS WITH UPPER ENDOSCOPY AND ERAS PATHWAY;  Surgeon: Kinsinger, De Blanch, MD;  Location: [facility] ORS;  Service: General;  Laterality: N/A;   INTRAOPERATIVE ARTERIOGRAM  08/29/2011   Procedure: INTRA OPERATIVE ARTERIOGRAM;  Surgeon: Sherren Kerns, MD;  Location: [facility] OR;  Service: Vascular;  Laterality: Right;   LOWER EXTREMITY ANGIOGRAM N/A 08/10/2011   Procedure: LOWER EXTREMITY ANGIOGRAM;  Surgeon: Sherren Kerns, MD;  Location: [facility] CATH LAB;  Service: Cardiovascular;  Laterality: N/A;   THYROIDECTOMY N/A 05/29/2019   Procedure: TOTAL THYROIDECTOMY;  Surgeon: Darnell Level, MD;  Location: [facility] ORS;  Service: General;  Laterality:  N/A;   TOE AMPUTATION  07/12/2008    left foot toes 1,2,3   TUBAL LIGATION     VASCULAR SURGERY      Allergies  Allergen Reactions   Amoxicillin-Pot Clavulanate Shortness Of Breath    Has patient had a PCN reaction causing immediate rash, facial/tongue/throat swelling, SOB or lightheadedness with hypotension: Yes Has patient had a PCN reaction causing severe rash involving mucus membranes or skin necrosis: No Has patient had a PCN reaction that required hospitalization: Yes Has patient had a PCN reaction occurring within the last 10 years: No If all of the above answers are "NO", then may proceed with Cephalosporin use.    Codeine Anaphylaxis   Darvocet [Propoxyphene N-Acetaminophen] Anaphylaxis    Plain Tylenol ok   Oxycodone Anaphylaxis   Propoxyphene Swelling and Anaphylaxis   Statins Other (See Comments)    Joint pain    Current Outpatient Medications  Medication Sig Dispense Refill   ACCU-CHEK GUIDE test strip 1 each by Other route as needed for other (Glucose).     Accu-Chek Softclix Lancets lancets daily.     acetaminophen (TYLENOL) 325 MG tablet Take 2 tablets (650 mg total) by mouth every 6 (six) hours as needed for mild pain (or Fever >/= 101).     ALPRAZolam (XANAX) 1 MG tablet Take by mouth.     amphetamine-dextroamphetamine (ADDERALL) 20 MG tablet Take 20 mg by mouth as directed.     atorvastatin (LIPITOR) 40 MG tablet Take 40 mg by mouth.     calcium-vitamin D (OSCAL WITH D) 500-200 MG-UNIT TABS tablet Oyster Shell Calcium-Vitamin D3 500 mg-5 mcg (200 unit) tablet  TAKE 2 TABLETS BY MOUTH 3 TIMES DAILY WITH MEALS     ciprofloxacin (CIPRO) 500 MG tablet Take 1 tablet (500 mg total) by mouth every 12 (twelve) hours. 10 tablet 0   clobetasol ointment (TEMOVATE) 0.05 % Apply topically 2 (two) times daily. 30 g 0   clonazePAM (KLONOPIN) 1 MG tablet Take 1 tablet by mouth 3 (three) times daily.     clopidogrel (PLAVIX) 75 MG tablet Take 1 tablet by mouth daily.      cyclobenzaprine (FLEXERIL) 10 MG tablet Take 1 tablet by mouth 3 (three) times daily as needed.     desvenlafaxine (PRISTIQ) 100 MG 24 hr tablet Take 1 tablet by mouth every morning.     Dulaglutide (TRULICITY) 0.75 MG/0.5ML SOAJ 0.75 mg.     DULoxetine (CYMBALTA) 30 MG capsule Take 1 tablet by mouth daily.     DULoxetine (CYMBALTA) 60 MG capsule Take 2 capsules  by mouth daily.     DULoxetine (CYMBALTA) 60 MG capsule Take 60 mg by mouth daily.     ergocalciferol (VITAMIN D2) 1.25 MG (50000 UT) capsule 1 capsule     ergocalciferol (VITAMIN D2) 1.25 MG (50000 UT) capsule Take 50,000 Units by mouth once a week.     erythromycin ophthalmic ointment Place a 1/2 inch ribbon of ointment into the lower eyelid. 3.5 g 0   fluconazole (DIFLUCAN) 150 MG tablet Take one tab PO today and repeat dose in 3 days if symptoms persist 2 tablet 0   gabapentin (NEURONTIN) 100 MG capsule Take 100 mg by mouth 2 (two) times daily.     gabapentin (NEURONTIN) 300 MG capsule Take 300 mg by mouth 2 (two) times daily.     hydrochlorothiazide (HYDRODIURIL) 25 MG tablet Take 1 tablet (25 mg total) by mouth in the morning. 30 tablet 0   levothyroxine (SYNTHROID) 150 MCG tablet Take 1 tablet (150 mcg total) by mouth daily before breakfast. 30 tablet 0   losartan (COZAAR) 100 MG tablet Take 1 tablet (100 mg total) by mouth daily. 30 tablet 0   losartan-hydrochlorothiazide (HYZAAR) 100-25 MG tablet Take 1 tablet by mouth daily.     metFORMIN (GLUCOPHAGE) 1000 MG tablet Take 1 tablet (1,000 mg total) by mouth 2 (two) times daily with a meal. 30 tablet 0   methimazole (TAPAZOLE) 5 MG tablet Take 5 mg by mouth as directed.  TAKE 1/2 TABLET BY MOUTH 3 TIMES WEEKLY     Na Sulfate-K Sulfate-Mg Sulf 17.5-3.13-1.6 GM/177ML SOLN See admin instructions.     nystatin powder Apply 1 application topically 3 (three) times daily. 15 g 0   nystatin-triamcinolone (MYCOLOG II) cream nystatin-triamcinolone 100,000 unit/g-0.1 % topical cream  APPLY  TOPICALLY TO THE AFFECTED AREA(S) 2 TIMES PER DAY IN THE MORNING AND EVENING     pregabalin (LYRICA) 150 MG capsule Take 1 capsule by mouth 2 (two) times daily.     pregabalin (LYRICA) 75 MG capsule Take 1 capsule by mouth 2 (two) times daily.     rosuvastatin (CRESTOR) 10 MG tablet Take 10 mg by mouth daily.     Semaglutide, 1 MG/DOSE, (OZEMPIC, 1 MG/DOSE,) 4 MG/3ML SOPN Ozempic 1 mg/dose (4 mg/3 mL) subcutaneous pen injector     topiramate (TOPAMAX) 50 MG tablet Take 1 tablet by mouth at bedtime.     traMADol (ULTRAM) 50 MG tablet Take 50 mg by mouth every 6 (six) hours as needed for severe pain (pain score 7-10).     traZODone (DESYREL) 100 MG tablet Take 200-300 mg by mouth at bedtime.     traZODone (DESYREL) 50 MG tablet Take 50-150 mg by mouth at bedtime as needed.     No current facility-administered medications for this visit.    Family History  Problem Relation Age of Onset   Diabetes Mother    Hyperlipidemia Mother    Hypertension Mother    Diabetes Father    Heart disease Father        before age 4   Hypertension Father    Hyperlipidemia Father    Hypertension Sister    Diabetes Brother    Glaucoma Paternal Grandfather     Social History   Socioeconomic History   Marital status: Divorced    Spouse name: Not on file   Number of children: Not on file   Years of education: Not on file   Highest education level: Not on file  Occupational History  Not on file  Tobacco Use   Smoking status: Former    Current packs/day: 0.00    Average packs/day: 1 pack/day for 10.0 years (10.0 ttl pk-yrs)    Types: Cigarettes    Start date: 07/26/1998    Quit date: 07/25/2008    Years since quitting: 14.5   Smokeless tobacco: Never   Tobacco comments:    quit 67yrs ago  Vaping Use   Vaping status: Never Used  Substance and Sexual Activity   Alcohol use: No   Drug use: No   Sexual activity: Not Currently  Other Topics Concern   Not on file  Social History Narrative   Not  on file   Social Determinants of Health   Financial Resource Strain: Not on file  Food Insecurity: Not on file  Transportation Needs: Not on file  Physical Activity: Not on file  Stress: Not on file  Social Connections: Not on file  Intimate Partner Violence: Not on file     REVIEW OF SYSTEMS:  *** [X]  denotes positive finding, [ ]  denotes negative finding Cardiac  Comments:  Chest pain or chest pressure:    Shortness of breath upon exertion:    Short of breath when lying flat:    Irregular heart rhythm:        Vascular    Pain in calf, thigh, or hip brought on by ambulation:    Pain in feet at night that wakes you up from your sleep:     Blood clot in your veins:    Leg swelling:         Pulmonary    Oxygen at home:    Productive cough:     Wheezing:         Neurologic    Sudden weakness in arms or legs:     Sudden numbness in arms or legs:     Sudden onset of difficulty speaking or slurred speech:    Temporary loss of vision in one eye:     Problems with dizziness:         Gastrointestinal    Blood in stool:     Vomited blood:         Genitourinary    Burning when urinating:     Blood in urine:        Psychiatric    Major depression:         Hematologic    Bleeding problems:    Problems with blood clotting too easily:        Skin    Rashes or ulcers:        Constitutional    Fever or chills:      PHYSICAL EXAMINATION:  ***  General:  WDWN in NAD; vital signs documented above Gait: Not observed HENT: WNL, normocephalic Pulmonary: normal non-labored breathing , without wheezing Cardiac: {Desc; regular/irreg:14544} HR, {With/Without:20273} carotid bruit*** Abdomen: soft, NT; aortic pulse is *** palpable Skin: {With/Without:20273} rashes Vascular Exam/Pulses:  Right Left  Radial {Exam; arterial pulse strength 0-4:30167} {Exam; arterial pulse strength 0-4:30167}  Femoral {Exam; arterial pulse strength 0-4:30167} {Exam; arterial pulse strength  0-4:30167}  Popliteal {Exam; arterial pulse strength 0-4:30167} {Exam; arterial pulse strength 0-4:30167}  DP {Exam; arterial pulse strength 0-4:30167} {Exam; arterial pulse strength 0-4:30167}  PT {Exam; arterial pulse strength 0-4:30167} {Exam; arterial pulse strength 0-4:30167}  Peroneal *** ***   Extremities: {With/Without:20273} ischemic changes, {With/Without:20273} Gangrene , {With/Without:20273} cellulitis; {With/Without:20273} open wounds Musculoskeletal: no muscle wasting or atrophy  Neurologic: A&O  X 3 Psychiatric:  The pt has {Desc; normal/abnormal:11317::"Normal"} affect.   Non-Invasive Vascular Imaging:   ABI's/TBI's on 01/29/2023: Right:  *** - Great toe pressure: *** Left:  *** - Great toe pressure: ***  Arterial duplex on 01/29/2023: ***  Previous ABI's/TBI's on 07/31/2022: Right:  0.92/0.54 - Great toe pressure: 59 Left:  0.83/amp - Great toe pressure: amp  Previous arterial duplex on 07/31/2022: Right Graft #1: Fem-pop  +------------------+--------+--------+---------+--------+                   PSV cm/sStenosisWaveform Comments  +------------------+--------+--------+---------+--------+  Inflow           148             triphasic          +------------------+--------+--------+---------+--------+  Prox Anastomosis  133             biphasic           +------------------+--------+--------+---------+--------+  Proximal Graft    68              triphasic          +------------------+--------+--------+---------+--------+  Mid Graft         81              triphasic          +------------------+--------+--------+---------+--------+  Distal Graft      83              triphasic          +------------------+--------+--------+---------+--------+  Distal Anastomosis92              triphasic          +------------------+--------+--------+---------+--------+  Outflow          68              triphasic           +------------------+--------+--------+---------+--------+   Left Graft #1: fem-pop  +--------------------+--------+---------------+---------+--------+                     PSV cm/sStenosis       Waveform Comments  +--------------------+--------+---------------+---------+--------+  Inflow             190                    biphasic           +--------------------+--------+---------------+---------+--------+  Proximal Anastomosis217     50-70% stenosistriphasic          +--------------------+--------+---------------+---------+--------+  Proximal Graft      53                     triphasic          +--------------------+--------+---------------+---------+--------+  Mid Graft           62                     triphasic          +--------------------+--------+---------------+---------+--------+  Distal Graft        62                     triphasic          +--------------------+--------+---------------+---------+--------+  Distal Anastomosis  85                     triphasic          +--------------------+--------+---------------+---------+--------+  Outflow  70                     triphasic          +--------------------+--------+---------------+---------+--------+   Summary:  Right: Patent graft without evidence of stenosis.   Left: 50-70% stenosis noted in the proximal anastomosis, otherwise graft appears patent     ASSESSMENT/PLAN:: 57 y.o. female here for follow up for PAD with hx of left CFA endarterectomy and left femoral to AK popliteal bypass with GSV by Dr. Darrick Penna on 07/05/2010.  She subsequently underwent amputations of toes 1, 2, and 3 on the left foot on 07/13/2010 also by Dr. Darrick Penna.  She underwent right femoral to AK popliteal bypass with GSV on 08/27/2011 also by Dr. Darrick Penna for rest pain.  On 08/29/2011, her RLE bypass thrombosed and she underwent thrombectomy of RLE bypass also by Dr. Darrick Penna. On 01/07/2012, she underwent  amputation of right 4th and 5th toes also by Dr. Darrick Penna.    -*** -continue *** -pt will f/u in *** with ***.   Doreatha Massed, [facility] [phone]  Clinic MD:   [facility]

## 2023-01-29 ENCOUNTER — Ambulatory Visit (HOSPITAL_COMMUNITY): Payer: Medicare HMO | Attending: Vascular Surgery

## 2023-01-29 ENCOUNTER — Ambulatory Visit: Payer: Medicare Other

## 2023-01-29 ENCOUNTER — Ambulatory Visit (HOSPITAL_COMMUNITY): Payer: Medicare HMO

## 2023-03-22 ENCOUNTER — Ambulatory Visit (HOSPITAL_COMMUNITY)
Admission: RE | Admit: 2023-03-22 | Discharge: 2023-03-22 | Disposition: A | Discharge status: Home / Self Care | Payer: Medicare HMO | Source: Ambulatory Visit | Attending: Vascular Surgery

## 2023-03-22 ENCOUNTER — Ambulatory Visit (INDEPENDENT_AMBULATORY_CARE_PROVIDER_SITE_OTHER)
Admission: RE | Admit: 2023-03-22 | Discharge: 2023-03-22 | Disposition: A | Discharge status: Home / Self Care | Payer: Medicare HMO | Source: Ambulatory Visit | Attending: Vascular Surgery | Admitting: Vascular Surgery

## 2023-03-22 ENCOUNTER — Ambulatory Visit (INDEPENDENT_AMBULATORY_CARE_PROVIDER_SITE_OTHER): Payer: Medicare HMO | Admitting: Physician Assistant

## 2023-03-22 VITALS — BP 161/98 | HR 81 | Temp 97.5°F | Resp 20 | Ht 67.0 in | Wt 188.2 lb

## 2023-03-22 DIAGNOSIS — I739 Peripheral vascular disease, unspecified: Secondary | ICD-10-CM

## 2023-03-22 DIAGNOSIS — Z95828 Presence of other vascular implants and grafts: Secondary | ICD-10-CM | POA: Diagnosis not present

## 2023-03-22 LAB — VAS US ABI WITH/WO TBI
Left ABI: 1.03
Right ABI: 1.05

## 2023-03-22 NOTE — Progress Notes (Signed)
Office Note     CC:  follow up Requesting Provider:  Zachery Dauer, FNP  HPI: Kelly Barber is a 58 y.o. ([birth year]) female who presents for surveillance follow up of PAD. She has extensive remote vascular surgery history of left CFA endarterectomy and left femoral to AK popliteal bypass with GSV by Dr. Darrick Penna on 07/05/2010.  She subsequently underwent amputations of toes 1, 2, and 3 on the left foot on 07/13/2010 by Dr. Darrick Penna. Right femoral to AK popliteal bypass with GSV on 08/27/2011 by Dr. Darrick Penna for rest pain.  On 08/29/2011, her RLE bypass thrombosed and she underwent thrombectomy of RLE bypass by Dr. Darrick Penna. She also underwent amputation of right 4th and 5th toes by Dr. Darrick Penna in November of 2013.  At her last visit in May she was overall doing well. She was experiencing some neuropathy in her amputated toes. This was being managed medically with Gabapentin. She was no longer on Aspirin or Plavix. She was compliant with her statin although complaining of myalgias after being switched from  Crestor to Lipitor. She was with some non disabling claudication symptoms at about 1 block. No rest pain or tissue loss. It was recommended that she restart her Aspirin 81 mg.   Today she reports overall she has been doing well. She has been getting intermittent severe cramps in her left leg that have been waking her up out of sleep over the past several weeks. She reports no pain in her feet. No pain on ambulation. No tissue loss. She says she has had some issues with her stomach as well and feels it may all be related to her Gastric bypass surgery. She plans to follow up with her surgeon. She currently is only taking Crestor.   The pt is on a statin for cholesterol management.    The pt is not an aspirin.    Other AC: none The pt is on is on ARB for hypertension.  The pt is medically managed for diabetes. Tobacco hx:  former  Past Medical History:  Diagnosis Date   Anemia    Anxiety    takes xanax  daily   Anxiety disorder    Cataract    Mixed form OU   Complication of anesthesia    Depression, major    takes wellbutrin and celexa daily   Diabetes mellitus    type 2    Diabetic retinopathy (HCC)    NPDR OU   Fibromyalgia    GERD (gastroesophageal reflux disease)    doesn't take any meds   H/O hiatal hernia    Hyperlipidemia    Hypertension    takes Lisinopril and Diovan daily   Hypertensive retinopathy    OU   Hyperthyroidism    yrs ago but doesn't require meds for this   Immune deficiency disorder (HCC)    pt states she has no clue what this is   Insomnia    Joint pain    Joint swelling    PTSD (post-traumatic stress disorder)    PTSD (post-traumatic stress disorder)    PVD (peripheral vascular disease) (HCC)    Sleep apnea     Past Surgical History:  Procedure Laterality Date   ABDOMINAL AORTAGRAM N/A 08/10/2011   Procedure: ABDOMINAL Ronny Flurry;  Surgeon: Sherren Kerns, MD;  Location: [facility] CATH LAB;  Service: Cardiovascular;  Laterality: N/A;   AMPUTATION     lft foot toes 1.2.3   AMPUTATION  01/07/2012   Procedure: AMPUTATION RAY;  Surgeon: Sherren Kerns, MD;  Location: [facility] OR;  Service: Vascular;  Laterality: Right;  4TH & 5TH toes   aortogram     CHOLECYSTECTOMY  1990   COLONOSCOPY     DILATION AND CURETTAGE OF UTERUS     FEMORAL BYPASS  04/29/2008   left   FEMORAL-POPLITEAL BYPASS GRAFT  08/27/2011   Procedure: BYPASS GRAFT FEMORAL-POPLITEAL ARTERY;  Surgeon: Sherren Kerns, MD;  Location: [facility] OR;  Service: Vascular;  Laterality: Right;   FEMORAL-POPLITEAL BYPASS GRAFT  08/29/2011   Procedure: BYPASS GRAFT FEMORAL-POPLITEAL ARTERY;  Surgeon: Sherren Kerns, MD;  Location: [facility] OR;  Service: Vascular;  Laterality: Right;  Vein Patch Angionplasty and Thrombectomy    GASTRIC ROUX-EN-Y N/A 09/02/2017   Procedure: LAPAROSCOPIC ROUX-EN-Y GASTRIC BYPASS WITH UPPER ENDOSCOPY AND ERAS PATHWAY;  Surgeon: Sheliah Hatch De Blanch, MD;  Location: [facility] ORS;  Service:  General;  Laterality: N/A;   INTRAOPERATIVE ARTERIOGRAM  08/29/2011   Procedure: INTRA OPERATIVE ARTERIOGRAM;  Surgeon: Sherren Kerns, MD;  Location: [facility] OR;  Service: Vascular;  Laterality: Right;   LOWER EXTREMITY ANGIOGRAM N/A 08/10/2011   Procedure: LOWER EXTREMITY ANGIOGRAM;  Surgeon: Sherren Kerns, MD;  Location: [facility] CATH LAB;  Service: Cardiovascular;  Laterality: N/A;   THYROIDECTOMY N/A 05/29/2019   Procedure: TOTAL THYROIDECTOMY;  Surgeon: Darnell Level, MD;  Location: [facility] ORS;  Service: General;  Laterality: N/A;   TOE AMPUTATION  07/12/2008    left foot toes 1,2,3   TUBAL LIGATION     VASCULAR SURGERY      Social History   Socioeconomic History   Marital status: Divorced    Spouse name: Not on file   Number of children: Not on file   Years of education: Not on file   Highest education level: Not on file  Occupational History   Not on file  Tobacco Use   Smoking status: Former    Current packs/day: 0.00    Average packs/day: 1 pack/day for 10.0 years (10.0 ttl pk-yrs)    Types: Cigarettes    Start date: 07/26/1998    Quit date: 07/25/2008    Years since quitting: 14.6   Smokeless tobacco: Never   Tobacco comments:    quit 13yrs ago  Vaping Use   Vaping status: Never Used  Substance and Sexual Activity   Alcohol use: No   Drug use: No   Sexual activity: Not Currently  Other Topics Concern   Not on file  Social History Narrative   Not on file   Social Drivers of Health   Financial Resource Strain: Not on file  Food Insecurity: Not on file  Transportation Needs: Not on file  Physical Activity: Not on file  Stress: Not on file  Social Connections: Not on file  Intimate Partner Violence: Not on file    Family History  Problem Relation Age of Onset   Diabetes Mother    Hyperlipidemia Mother    Hypertension Mother    Diabetes Father    Heart disease Father        before age 69   Hypertension Father    Hyperlipidemia Father    Hypertension Sister     Diabetes Brother    Glaucoma Paternal Grandfather     Current Outpatient Medications  Medication Sig Dispense Refill   ACCU-CHEK GUIDE test strip 1 each by Other route as needed for other (Glucose).     Accu-Chek Softclix Lancets lancets daily.     acetaminophen (TYLENOL) 325 MG tablet  Take 2 tablets (650 mg total) by mouth every 6 (six) hours as needed for mild pain (or Fever >/= 101).     ALPRAZolam (XANAX) 1 MG tablet Take by mouth.     amphetamine-dextroamphetamine (ADDERALL) 20 MG tablet Take 20 mg by mouth as directed.     atorvastatin (LIPITOR) 40 MG tablet Take 40 mg by mouth.     calcium-vitamin D (OSCAL WITH D) 500-200 MG-UNIT TABS tablet Oyster Shell Calcium-Vitamin D3 500 mg-5 mcg (200 unit) tablet  TAKE 2 TABLETS BY MOUTH 3 TIMES DAILY WITH MEALS     ciprofloxacin (CIPRO) 500 MG tablet Take 1 tablet (500 mg total) by mouth every 12 (twelve) hours. 10 tablet 0   clobetasol ointment (TEMOVATE) 0.05 % Apply topically 2 (two) times daily. 30 g 0   clonazePAM (KLONOPIN) 1 MG tablet Take 1 tablet by mouth 3 (three) times daily.     clopidogrel (PLAVIX) 75 MG tablet Take 1 tablet by mouth daily.     cyclobenzaprine (FLEXERIL) 10 MG tablet Take 1 tablet by mouth 3 (three) times daily as needed.     desvenlafaxine (PRISTIQ) 100 MG 24 hr tablet Take 1 tablet by mouth every morning.     Dulaglutide (TRULICITY) 0.75 MG/0.5ML SOAJ 0.75 mg.     DULoxetine (CYMBALTA) 30 MG capsule Take 1 tablet by mouth daily.     DULoxetine (CYMBALTA) 60 MG capsule Take 2 capsules by mouth daily.     DULoxetine (CYMBALTA) 60 MG capsule Take 60 mg by mouth daily.     ergocalciferol (VITAMIN D2) 1.25 MG (50000 UT) capsule 1 capsule     ergocalciferol (VITAMIN D2) 1.25 MG (50000 UT) capsule Take 50,000 Units by mouth once a week.     erythromycin ophthalmic ointment Place a 1/2 inch ribbon of ointment into the lower eyelid. 3.5 g 0   fluconazole (DIFLUCAN) 150 MG tablet Take one tab PO today and repeat dose in  3 days if symptoms persist 2 tablet 0   gabapentin (NEURONTIN) 100 MG capsule Take 100 mg by mouth 2 (two) times daily.     gabapentin (NEURONTIN) 300 MG capsule Take 300 mg by mouth 2 (two) times daily.     hydrochlorothiazide (HYDRODIURIL) 25 MG tablet Take 1 tablet (25 mg total) by mouth in the morning. 30 tablet 0   levothyroxine (SYNTHROID) 150 MCG tablet Take 1 tablet (150 mcg total) by mouth daily before breakfast. 30 tablet 0   losartan (COZAAR) 100 MG tablet Take 1 tablet (100 mg total) by mouth daily. 30 tablet 0   losartan-hydrochlorothiazide (HYZAAR) 100-25 MG tablet Take 1 tablet by mouth daily.     metFORMIN (GLUCOPHAGE) 1000 MG tablet Take 1 tablet (1,000 mg total) by mouth 2 (two) times daily with a meal. 30 tablet 0   methimazole (TAPAZOLE) 5 MG tablet Take 5 mg by mouth as directed.  TAKE 1/2 TABLET BY MOUTH 3 TIMES WEEKLY     Na Sulfate-K Sulfate-Mg Sulf 17.5-3.13-1.6 GM/177ML SOLN See admin instructions.     nystatin powder Apply 1 application topically 3 (three) times daily. 15 g 0   nystatin-triamcinolone (MYCOLOG II) cream nystatin-triamcinolone 100,000 unit/g-0.1 % topical cream  APPLY TOPICALLY TO THE AFFECTED AREA(S) 2 TIMES PER DAY IN THE MORNING AND EVENING     pregabalin (LYRICA) 150 MG capsule Take 1 capsule by mouth 2 (two) times daily.     pregabalin (LYRICA) 75 MG capsule Take 1 capsule by mouth 2 (two) times daily.  rosuvastatin (CRESTOR) 10 MG tablet Take 10 mg by mouth daily.     Semaglutide, 1 MG/DOSE, (OZEMPIC, 1 MG/DOSE,) 4 MG/3ML SOPN Ozempic 1 mg/dose (4 mg/3 mL) subcutaneous pen injector     topiramate (TOPAMAX) 50 MG tablet Take 1 tablet by mouth at bedtime.     traMADol (ULTRAM) 50 MG tablet Take 50 mg by mouth every 6 (six) hours as needed for severe pain (pain score 7-10).     traZODone (DESYREL) 100 MG tablet Take 200-300 mg by mouth at bedtime.     traZODone (DESYREL) 50 MG tablet Take 50-150 mg by mouth at bedtime as needed.     No current  facility-administered medications for this visit.    Allergies  Allergen Reactions   Amoxicillin-Pot Clavulanate Shortness Of Breath    Has patient had a PCN reaction causing immediate rash, facial/tongue/throat swelling, SOB or lightheadedness with hypotension: Yes Has patient had a PCN reaction causing severe rash involving mucus membranes or skin necrosis: No Has patient had a PCN reaction that required hospitalization: Yes Has patient had a PCN reaction occurring within the last 10 years: No If all of the above answers are "NO", then may proceed with Cephalosporin use.    Codeine Anaphylaxis   Darvocet [Propoxyphene N-Acetaminophen] Anaphylaxis    Plain Tylenol ok   Oxycodone Anaphylaxis   Propoxyphene Swelling and Anaphylaxis   Statins Other (See Comments)    Joint pain     REVIEW OF SYSTEMS:  [X]  denotes positive finding, [ ]  denotes negative finding Cardiac  Comments:  Chest pain or chest pressure:    Shortness of breath upon exertion:    Short of breath when lying flat:    Irregular heart rhythm:        Vascular    Pain in calf, thigh, or hip brought on by ambulation:    Pain in feet at night that wakes you up from your sleep:     Blood clot in your veins:    Leg swelling:         Pulmonary    Oxygen at home:    Productive cough:     Wheezing:         Neurologic    Sudden weakness in arms or legs:     Sudden numbness in arms or legs:     Sudden onset of difficulty speaking or slurred speech:    Temporary loss of vision in one eye:     Problems with dizziness:         Gastrointestinal    Blood in stool:     Vomited blood:         Genitourinary    Burning when urinating:     Blood in urine:        Psychiatric    Major depression:         Hematologic    Bleeding problems:    Problems with blood clotting too easily:        Skin    Rashes or ulcers:        Constitutional    Fever or chills:      PHYSICAL EXAMINATION:  Vitals:   03/22/23 1506   BP: (!) 161/98  Pulse: 81  Resp: 20  Temp: (!) 97.5 F (36.4 C)  TempSrc: Temporal  SpO2: 98%  Weight: 188 lb 3.2 oz (85.4 kg)  Height: 5\' 7"  (1.702 m)    General:  WDWN in NAD; vital signs documented above Gait: Not observed  HENT: WNL, normocephalic Pulmonary: normal non-labored breathing Cardiac: regular HR Abdomen: soft Vascular Exam/Pulses: 2+ femoral, 2+ right DP, 2+ left PT pulse. Feet warm and well perfused Extremities: without ischemic changes, without Gangrene , without cellulitis; without open wounds;  Musculoskeletal: no muscle wasting or atrophy  Neurologic: A&O X 3 Psychiatric:  The pt has Normal affect.   Non-Invasive Vascular Imaging:   +-------+-----------+-----------+------------+------------+  ABI/TBIToday's ABIToday's TBIPrevious ABIPrevious TBI  +-------+-----------+-----------+------------+------------+  Right 1.05       0.75       0.92        0.54          +-------+-----------+-----------+------------+------------+  Left  1.03       amputated  0.83        amp           +-------+-----------+-----------+------------+------------+   VAS [place] Lower extremity bypass graft duplex: Summary: Patent bilateral femoral to popliteal bypass grafts.    ASSESSMENT/PLAN:: 58 y.o. female here for follow up for PAD. She has had bilateral lower extremity CFA to AK bypass grafts in 2012/2013 by Dr. Darrick Penna. She has had subsequent toe amputations as well. She currently is without claudication, rest pain or tissue loss. She has been experiencing some cramping in her left leg at night time. She does feel she has not been good about drinking water. She has had this issue in the past and it has been related to her Gastric bypass surgery. She plans to follow up with her Surgeon.  - ABIs today are improved bilaterally - Duplex shows patent bypass grafts bilaterally - Continue Statin, recommend restarting aspirin 81 mg - Encourage walking regimen - She will follow  up in 1 year with ABI and BLE bypass graft duplex - she knows to call for earlier follow up if she has any new or concerning symptoms   Graceann Congress, PA-C [facility] [phone]  Clinic MD:   Hetty Blend

## 2023-03-25 ENCOUNTER — Ambulatory Visit
Admission: EM | Admit: 2023-03-25 | Discharge: 2023-03-25 | Disposition: A | Discharge status: Home / Self Care | Payer: Medicare HMO | Attending: Internal Medicine | Admitting: Internal Medicine

## 2023-03-25 ENCOUNTER — Encounter: Payer: Self-pay | Admitting: Emergency Medicine

## 2023-03-25 DIAGNOSIS — N3000 Acute cystitis without hematuria: Secondary | ICD-10-CM | POA: Diagnosis not present

## 2023-03-25 DIAGNOSIS — Z113 Encounter for screening for infections with a predominantly sexual mode of transmission: Secondary | ICD-10-CM | POA: Diagnosis present

## 2023-03-25 DIAGNOSIS — R300 Dysuria: Secondary | ICD-10-CM | POA: Diagnosis present

## 2023-03-25 DIAGNOSIS — N898 Other specified noninflammatory disorders of vagina: Secondary | ICD-10-CM | POA: Insufficient documentation

## 2023-03-25 LAB — POCT URINALYSIS DIP (MANUAL ENTRY)
Bilirubin, UA: NEGATIVE
Blood, UA: NEGATIVE
Glucose, UA: NEGATIVE mg/dL
Ketones, POC UA: NEGATIVE mg/dL
Leukocytes, UA: NEGATIVE
Nitrite, UA: POSITIVE — AB
Protein Ur, POC: NEGATIVE mg/dL
Spec Grav, UA: 1.02 (ref 1.010–1.025)
Urobilinogen, UA: 0.2 U/dL
pH, UA: 5.5 (ref 5.0–8.0)

## 2023-03-25 MED ORDER — NITROFURANTOIN MONOHYD MACRO 100 MG PO CAPS: 100.0000 mg | ORAL_CAPSULE | Freq: Two times a day (BID) | ORAL | 0 refills | Status: DC

## 2023-03-25 MED ORDER — NYSTATIN-TRIAMCINOLONE 100000-0.1 UNIT/GM-% EX CREA: TOPICAL_CREAM | Freq: Two times a day (BID) | CUTANEOUS | 0 refills | Status: AC

## 2023-03-25 NOTE — ED Provider Notes (Signed)
[name]    CSN: [id number] Arrival date & time: 03/25/23  1328      History   Chief Complaint Chief Complaint  Patient presents with   Dysuria   Vaginal Itching   Urinary Frequency    HPI Kelly Barber is a 58 y.o. female who presents with onset of dysuria, and frequency and strong odor x 2 weeks. Has vaginal itching and is out of her nystatin. Would like STD testing. Denies vaginal discharge, fever, flank pain or blood in urine.     Past Medical History:  Diagnosis Date   Anemia    Anxiety    takes xanax daily   Anxiety disorder    Cataract    Mixed form OU   Complication of anesthesia    Depression, major    takes wellbutrin and celexa daily   Diabetes mellitus    type 2    Diabetic retinopathy (HCC)    NPDR OU   Fibromyalgia    GERD (gastroesophageal reflux disease)    doesn't take any meds   H/O hiatal hernia    Hyperlipidemia    Hypertension    takes Lisinopril and Diovan daily   Hypertensive retinopathy    OU   Hyperthyroidism    yrs ago but doesn't require meds for this   Immune deficiency disorder (HCC)    pt states she has no clue what this is   Insomnia    Joint pain    Joint swelling    PTSD (post-traumatic stress disorder)    PTSD (post-traumatic stress disorder)    PVD (peripheral vascular disease) (HCC)    Sleep apnea     Patient Active Problem List   Diagnosis Date Noted   ADD (attention deficit disorder) 01/07/2023   Diabetic peripheral neuropathy associated with type 2 diabetes mellitus (HCC) 01/07/2023   Diabetic retinopathy associated with type 2 diabetes mellitus (HCC) 01/07/2023   Fibromyalgia 01/07/2023   History of hyperthyroidism 01/07/2023   History of thyroidectomy 01/07/2023   Hyperlipidemia 01/07/2023   Microalbuminuria 01/07/2023   PTSD (post-traumatic stress disorder) 01/07/2023   Type 2 diabetes mellitus without complications (HCC) 01/07/2023   Vitamin D deficiency 01/07/2023   Anxiety  01/07/2023   Hyperthyroidism 01/07/2023   Hypothyroidism 01/07/2023   Thyromegaly 01/07/2023   Non-toxic multinodular goiter 01/07/2023   Body mass index (BMI) 31.0-31.9, adult 01/07/2023   Body mass index (BMI) 30.0-30.9, adult 01/07/2023   Obesity 01/07/2023   Peripheral vascular disease (HCC) 01/07/2023   Thyroid disorder screening 04/20/2021   Genital herpes simplex 04/14/2020   Pain in joint of right hip 08/27/2019   Pain in joint of left shoulder 08/27/2019   Pain in joint of right shoulder 08/27/2019   Pain of left hip joint 08/27/2019   Substernal thyroid goiter 05/26/2019   Subclinical hyperthyroidism 05/26/2019   Anemia 12/09/2018   Diabetes mellitus (HCC) 12/09/2018   Disorder of thyroid gland 12/09/2018   Benign hypertension 12/09/2018   Morbid obesity (HCC) 09/02/2017   Glaucoma 06/15/2014   S/P amputation of foot (HCC) 06/14/2014   Depression 06/14/2014   Diabetic neuropathy (HCC) 06/14/2014   Boil, axilla 08/10/2012   Goiter 05/04/2012   Aftercare following surgery of the circulatory system, NEC 12/20/2011   Atherosclerosis of native arteries of the extremities with ulceration (HCC) 12/20/2011   Preop cardiovascular exam 08/16/2011   Atherosclerosis of native artery of extremity with intermittent claudication (HCC) 07/26/2011   CELLULITIS AND ABSCESS OF TRUNK 08/09/2008   LOWER LIMB  AMPUTATION, OTHER TOE 08/03/2008   PERIPHERAL VASCULAR DISEASE 04/29/2008   LEUKOCYTOCLASTIC VASCULITIS 04/28/2008   Other nonthrombocytopenic purpura (HCC) 04/21/2008   GERD 02/23/2008   GOITER, MULTINODULAR 12/19/2006   Diabetes (HCC) 12/19/2006   Anxiety state 12/19/2006   DEPRESSION 12/19/2006   Essential hypertension 12/19/2006   Calculus of gallbladder 12/19/2006   FIBROMYALGIA 12/19/2006   WEIGHT GAIN 12/19/2006   CHEST PAIN, NON-CARDIAC 12/19/2006    Past Surgical History:  Procedure Laterality Date   ABDOMINAL AORTAGRAM N/A 08/10/2011   Procedure: ABDOMINAL  Ronny Flurry;  Surgeon: Sherren Kerns, MD;  Location: [facility] CATH LAB;  Service: Cardiovascular;  Laterality: N/A;   AMPUTATION     lft foot toes 1.2.3   AMPUTATION  01/07/2012   Procedure: AMPUTATION RAY;  Surgeon: Sherren Kerns, MD;  Location: [facility] OR;  Service: Vascular;  Laterality: Right;  4TH & 5TH toes   aortogram     BARIATRIC SURGERY Bilateral 2019   CHOLECYSTECTOMY  03/05/1988   COLONOSCOPY     DILATION AND CURETTAGE OF UTERUS     FEMORAL BYPASS  04/29/2008   left   FEMORAL-POPLITEAL BYPASS GRAFT  08/27/2011   Procedure: BYPASS GRAFT FEMORAL-POPLITEAL ARTERY;  Surgeon: Sherren Kerns, MD;  Location: [facility] OR;  Service: Vascular;  Laterality: Right;   FEMORAL-POPLITEAL BYPASS GRAFT  08/29/2011   Procedure: BYPASS GRAFT FEMORAL-POPLITEAL ARTERY;  Surgeon: Sherren Kerns, MD;  Location: [facility] OR;  Service: Vascular;  Laterality: Right;  Vein Patch Angionplasty and Thrombectomy    GASTRIC ROUX-EN-Y N/A 09/02/2017   Procedure: LAPAROSCOPIC ROUX-EN-Y GASTRIC BYPASS WITH UPPER ENDOSCOPY AND ERAS PATHWAY;  Surgeon: Kinsinger, De Blanch, MD;  Location: [facility] ORS;  Service: General;  Laterality: N/A;   INTRAOPERATIVE ARTERIOGRAM  08/29/2011   Procedure: INTRA OPERATIVE ARTERIOGRAM;  Surgeon: Sherren Kerns, MD;  Location: [facility] OR;  Service: Vascular;  Laterality: Right;   LOWER EXTREMITY ANGIOGRAM N/A 08/10/2011   Procedure: LOWER EXTREMITY ANGIOGRAM;  Surgeon: Sherren Kerns, MD;  Location: [facility] CATH LAB;  Service: Cardiovascular;  Laterality: N/A;   THYROIDECTOMY N/A 05/29/2019   Procedure: TOTAL THYROIDECTOMY;  Surgeon: Darnell Level, MD;  Location: [facility] ORS;  Service: General;  Laterality: N/A;   TOE AMPUTATION  07/12/2008    left foot toes 1,2,3   TUBAL LIGATION     VASCULAR SURGERY      OB History   No obstetric history on file.      Home Medications    Prior to Admission medications   Medication Sig Start Date End Date Taking? Authorizing Provider  ALPRAZolam Prudy Feeler) 1 MG tablet  Take 1 tablet by mouth 3 (three) times daily as needed.   Yes [provider]  atorvastatin (LIPITOR) 40 MG tablet Take 40 mg by mouth. 12/10/22  Yes [provider]  calcium-vitamin D (OSCAL WITH D) 500-200 MG-UNIT TABS tablet Oyster Shell Calcium-Vitamin D3 500 mg-5 mcg (200 unit) tablet  TAKE 2 TABLETS BY MOUTH 3 TIMES DAILY WITH MEALS   Yes [provider]  ergocalciferol (VITAMIN D2) 1.25 MG (50000 UT) capsule Take 50,000 Units by mouth once a week.   Yes [provider]  levothyroxine (SYNTHROID) 150 MCG tablet Take 1 tablet (150 mcg total) by mouth daily before breakfast. 03/26/22  Yes Lurline Idol, FNP  nitrofurantoin, macrocrystal-monohydrate, (MACROBID) 100 MG capsule Take 1 capsule (100 mg total) by mouth 2 (two) times daily. 03/25/23  Yes Rodriguez-Southworth, Nettie Elm, PA-C  OZEMPIC, 2 MG/DOSE, 8 MG/3ML SOPN Inject 2 mg into the skin  once a week. 03/05/23  Yes [provider]  ACCU-CHEK GUIDE test strip 1 each by Other route as needed for other (Glucose). 07/26/22   [provider]  Accu-Chek Softclix Lancets lancets daily. 07/26/22   [provider]  acetaminophen (TYLENOL) 325 MG tablet Take 2 tablets (650 mg total) by mouth every 6 (six) hours as needed for mild pain (or Fever >/= 101). 05/30/19   Berna Bue, MD  ALPRAZolam Prudy Feeler) 1 MG tablet Take by mouth. 05/24/20   [provider]  gabapentin (NEURONTIN) 100 MG capsule Take 100 mg by mouth 2 (two) times daily. 12/25/22   [provider]  gabapentin (NEURONTIN) 300 MG capsule Take 300 mg by mouth 2 (two) times daily.    [provider]  nystatin-triamcinolone (MYCOLOG II) cream Apply topically 2 (two) times daily. 03/25/23   Rodriguez-Southworth, Nettie Elm, PA-C  Semaglutide, 1 MG/DOSE, (OZEMPIC, 1 MG/DOSE,) 4 MG/3ML SOPN Ozempic 1 mg/dose (4 mg/3 mL) subcutaneous pen injector Patient not taking: Reported on 03/25/2023    [provider]    Family History Family History  Problem Relation Age of Onset   Diabetes Mother    Hyperlipidemia Mother    Hypertension Mother    Diabetes Father    Heart disease Father        before age 57   Hypertension Father    Hyperlipidemia Father    Hypertension Sister    Diabetes Brother    Glaucoma Paternal Grandfather     Social History Social History   Tobacco Use   Smoking status: Former    Current packs/day: 0.00    Average packs/day: 1 pack/day for 10.0 years (10.0 ttl pk-yrs)    Types: Cigarettes    Start date: 07/26/1998    Quit date: 07/25/2008    Years since quitting: 14.6   Smokeless tobacco: Never   Tobacco comments:    quit 21yrs ago  Vaping Use   Vaping status: Never Used  Substance Use Topics   Alcohol use: No   Drug use: No     Allergies   Amoxicillin-pot clavulanate, Codeine, Darvocet [propoxyphene n-acetaminophen], Oxycodone, Propoxyphene, and Statins   Review of Systems Review of Systems  As noted in HPI Physical Exam Triage Vital Signs ED Triage Vitals  Encounter Vitals Group     BP 03/25/23 1520 (!) 155/97     Systolic BP Percentile --      Diastolic BP Percentile --      Pulse Rate 03/25/23 1520 79     Resp 03/25/23 1520 16     Temp 03/25/23 1520 97.8 F (36.6 C)     Temp Source 03/25/23 1520 Oral     SpO2 03/25/23 1520 99 %     Weight --      Height --      Head Circumference --      Peak Flow --      Pain Score 03/25/23 1521 8     Pain Loc --      Pain Education --      Exclude from Growth Chart --    No data found.  Updated Vital Signs BP (!) 155/97 (BP Location: Left Arm)   Pulse 79   Temp 97.8 F (36.6 C) (Oral)   Resp 16   LMP 03/05/2016 Comment: tubal ligation 2011  SpO2 99%   Visual Acuity Right Eye Distance:   Left Eye Distance:   Bilateral Distance:    Right Eye Near:  Left Eye Near:    Bilateral Near:     Physical Exam Physical Exam Vitals and nursing note reviewed.  Constitutional:       General: She is not in acute distress.    Appearance: She is not toxic-appearing.  HENT:     Head: Normocephalic.     Right Ear: External ear normal.     Left Ear: External ear normal.  Eyes:     General: No scleral icterus.    Conjunctiva/sclera: Conjunctivae normal.  Pulmonary:     Effort: Pulmonary effort is normal.  Abdominal:     General: Bowel sounds are normal.     Palpations: Abdomen is soft. There is no mass.     Tenderness: There is no guarding or rebound.     Comments: - CVA tenderness   Musculoskeletal:        General: Normal range of motion.     Cervical back: Neck supple.      Skin:    General: Skin is warm and dry.     Findings: No rash.  Neurological:     Mental Status: She is alert and oriented to person, place, and time.     Gait: Gait normal.  Psychiatric:        Mood and Affect: Mood normal.        Behavior: Behavior normal.        Thought Content: Thought content normal.        Judgment: Judgment normal.    UC Treatments / Results  Labs (all labs ordered are listed, but only abnormal results are displayed) Labs Reviewed  POCT URINALYSIS DIP (MANUAL ENTRY) - Abnormal; Notable for the following components:      Result Value   Clarity, UA hazy (*)    Nitrite, UA Positive (*)    All other components within normal limits  CERVICOVAGINAL ANCILLARY ONLY    EKG   Radiology No results found.  Procedures Procedures (including critical care time)  Medications Ordered in UC Medications - No data to display  Initial Impression / Assessment and Plan / UC Course  I have reviewed the triage vital signs and the nursing notes.  Pertinent labs  results that were available during my care of the patient were reviewed by me and considered in my medical decision making (see chart for details).  UTI Possible Candida vaginitis  I placed her on Macrobid as noted. Urine culture ordered.  I refilled her Nystatin cream as noted.  We will inform her when the  results are back   Final Clinical Impressions(s) / UC Diagnoses   Final diagnoses:  Vaginal itching  Acute cystitis without hematuria   Discharge Instructions   None    ED Prescriptions     Medication Sig Dispense Auth. Provider   nystatin-triamcinolone (MYCOLOG II) cream Apply topically 2 (two) times daily. 60 g Rodriguez-Southworth, Slayton Lubitz, PA-C   nitrofurantoin, macrocrystal-monohydrate, (MACROBID) 100 MG capsule Take 1 capsule (100 mg total) by mouth 2 (two) times daily. 10 capsule Rodriguez-Southworth, Nettie Elm, PA-C      PDMP not reviewed this encounter.   Garey Ham, PA-C 03/25/23 1606

## 2023-03-25 NOTE — ED Triage Notes (Signed)
Pt reports dysuria, vaginal itching, increased frequency, and foul order x2 weeks. Pt states "would like to be tested for everything with swab." Pt reports hx of lichens sclerosus. No OTC med use at home.

## 2023-03-26 LAB — CERVICOVAGINAL ANCILLARY ONLY
Bacterial Vaginitis (gardnerella): POSITIVE — AB
Candida Glabrata: NEGATIVE
Candida Vaginitis: NEGATIVE
Chlamydia: NEGATIVE
Comment: NEGATIVE
Comment: NEGATIVE
Comment: NEGATIVE
Comment: NEGATIVE
Comment: NEGATIVE
Comment: NORMAL
Neisseria Gonorrhea: NEGATIVE
Trichomonas: NEGATIVE

## 2023-03-27 ENCOUNTER — Encounter (HOSPITAL_COMMUNITY): Payer: Self-pay | Admitting: *Deleted

## 2023-03-28 ENCOUNTER — Telehealth (HOSPITAL_COMMUNITY): Payer: Self-pay

## 2023-03-28 MED ORDER — METRONIDAZOLE 0.75 % VA GEL: 1.0000 | Freq: Every day | VAGINAL | 0 refills | Status: AC

## 2023-03-28 MED ORDER — FLUCONAZOLE 150 MG PO TABS: 150.0000 mg | ORAL_TABLET | Freq: Once | ORAL | 0 refills | Status: AC

## 2023-03-28 NOTE — Telephone Encounter (Signed)
Per protocol, pt requires tx with metronidazole. TC to pt to advise of results and meds. Currently on Macrobid for UTI and requesting treatment for abx-associated yeast. Diflucan sent per protocol.  Pharmacy verified.

## 2023-04-16 ENCOUNTER — Other Ambulatory Visit: Payer: Self-pay

## 2023-04-16 DIAGNOSIS — I739 Peripheral vascular disease, unspecified: Secondary | ICD-10-CM

## 2023-08-13 ENCOUNTER — Ambulatory Visit

## 2023-08-28 ENCOUNTER — Encounter: Payer: Self-pay | Admitting: Podiatry

## 2023-08-28 ENCOUNTER — Ambulatory Visit (INDEPENDENT_AMBULATORY_CARE_PROVIDER_SITE_OTHER): Admitting: Podiatry

## 2023-08-28 VITALS — Ht 67.0 in | Wt 188.2 lb

## 2023-08-28 DIAGNOSIS — M79674 Pain in right toe(s): Secondary | ICD-10-CM | POA: Diagnosis not present

## 2023-08-28 DIAGNOSIS — B351 Tinea unguium: Secondary | ICD-10-CM | POA: Diagnosis not present

## 2023-08-28 DIAGNOSIS — M79675 Pain in left toe(s): Secondary | ICD-10-CM

## 2023-08-28 NOTE — Progress Notes (Signed)
 Chief Complaint  Patient presents with   Diabetes    Patient is here for [facility] and also would like for the doctor to look at her left plantar bottom of her foot, patient states that she has splinter in her foot .    SUBJECTIVE Patient with a history of diabetes mellitus presents to office today complaining of elongated, thickened nails that cause pain while ambulating in shoes.  Patient is unable to trim their own nails.  Patient is also concern for possible splinter to the plantar aspect of the left heel.  Patient is here for further evaluation and treatment.  Past Medical History:  Diagnosis Date   Anemia    Anxiety    takes xanax  daily   Anxiety disorder    Cataract    Mixed form OU   Complication of anesthesia    Depression, major    takes wellbutrin  and celexa  daily   Diabetes mellitus    type 2    Diabetic retinopathy (HCC)    NPDR OU   Fibromyalgia    GERD (gastroesophageal reflux disease)    doesn't take any meds   H/O hiatal hernia    Hyperlipidemia    Hypertension    takes Lisinopril and Diovan  daily   Hypertensive retinopathy    OU   Hyperthyroidism    yrs ago but doesn't require meds for this   Immune deficiency disorder (HCC)    pt states she has no clue what this is   Insomnia    Joint pain    Joint swelling    PTSD (post-traumatic stress disorder)    PTSD (post-traumatic stress disorder)    PVD (peripheral vascular disease) (HCC)    Sleep apnea     Allergies  Allergen Reactions   Amoxicillin-Pot Clavulanate Shortness Of Breath    Has patient had a PCN reaction causing immediate rash, facial/tongue/throat swelling, SOB or lightheadedness with hypotension: Yes Has patient had a PCN reaction causing severe rash involving mucus membranes or skin necrosis: No Has patient had a PCN reaction that required hospitalization: Yes Has patient had a PCN reaction occurring within the last 10 years: No If all of the above answers are NO, then may proceed with  Cephalosporin use.    Codeine Anaphylaxis   Darvocet [Propoxyphene N-Acetaminophen ] Anaphylaxis    Plain Tylenol  ok   Oxycodone  Anaphylaxis   Propoxyphene Swelling and Anaphylaxis   Statins Other (See Comments)    Joint pain     OBJECTIVE General Patient is awake, alert, and oriented x 3 and in no acute distress. Derm Skin is dry and supple bilateral. Negative open lesions or macerations. Remaining integument unremarkable. Nails are tender, long, thickened and dystrophic with subungual debris, consistent with onychomycosis, bilateral. No signs of infection noted. Vasc  DP and PT pedal pulses palpable bilaterally. Temperature gradient within normal limits.  Neuro light touch and protective threshold sensation diminished bilaterally.  Musculoskeletal Exam history of multiple digital amputations bilateral  ASSESSMENT 1. Diabetes Mellitus w/ peripheral neuropathy 2.  Pain due to onychomycosis of toenails bilateral 3.  Superficial callus with possible superficial splinter plantar aspect of the left heel 4.  History of amputation 1-3 LT.   4-5 RT.  PLAN OF CARE 1. Patient evaluated today. 2. Instructed to maintain good pedal hygiene and foot care. Stressed importance of controlling blood sugar.  3. Mechanical debridement of nails bilaterally performed using a nail nipper. Filed with dremel without incident.  4.  Light debridement of the plantar  callus was performed today using tissue nipper.  No identifiable foreign body noted.  There was some callus tissue around the area which was debrided.  No underlying wound or concern for ulcer or infection  5.  Return to clinic in 3 mos. routine footcare    Thresa EMERSON Sar, DPM [facility]  Dr. Thresa EMERSON Sar, DPM    [address] [place], [place] [address]                Office [phone]  Fax [phone]

## 2024-01-07 ENCOUNTER — Encounter: Payer: Self-pay | Admitting: Emergency Medicine

## 2024-01-07 ENCOUNTER — Ambulatory Visit
Admission: EM | Admit: 2024-01-07 | Discharge: 2024-01-07 | Disposition: A | Discharge status: Home / Self Care | Attending: Family Medicine | Admitting: Family Medicine

## 2024-01-07 ENCOUNTER — Other Ambulatory Visit: Payer: Self-pay

## 2024-01-07 DIAGNOSIS — L900 Lichen sclerosus et atrophicus: Secondary | ICD-10-CM | POA: Diagnosis present

## 2024-01-07 DIAGNOSIS — K5900 Constipation, unspecified: Secondary | ICD-10-CM | POA: Insufficient documentation

## 2024-01-07 DIAGNOSIS — N898 Other specified noninflammatory disorders of vagina: Secondary | ICD-10-CM | POA: Insufficient documentation

## 2024-01-07 DIAGNOSIS — R300 Dysuria: Secondary | ICD-10-CM | POA: Insufficient documentation

## 2024-01-07 LAB — POCT URINE DIPSTICK
Bilirubin, UA: NEGATIVE
Blood, UA: NEGATIVE
Glucose, UA: NEGATIVE mg/dL
Ketones, POC UA: NEGATIVE mg/dL
Nitrite, UA: NEGATIVE
POC PROTEIN,UA: NEGATIVE
Spec Grav, UA: 1.025 (ref 1.010–1.025)
Urobilinogen, UA: 0.2 U/dL
pH, UA: 5.5 (ref 5.0–8.0)

## 2024-01-07 MED ORDER — CLOBETASOL PROPIONATE 0.05 % EX OINT: 1.0000 | TOPICAL_OINTMENT | Freq: Two times a day (BID) | CUTANEOUS | 0 refills | Status: AC

## 2024-01-07 NOTE — Discharge Instructions (Signed)
 You were seen today for various issues.  I have sent out an ointment for lichen sclerosis.  Your vaginal swab and urine culture will be resulted over the next several days.  If anything is positive you will be notified for treatment otherwise.  I do recommend you increase your fiber to help with constipation.  You may trial metamucil daily.  Please follow up with your primary care provider or ob/gyn for further care of your chronic health conditions.

## 2024-01-07 NOTE — ED Triage Notes (Signed)
 Pt here with frequency and urgency; pt sts  hx of similar; pt sts vaginal itching; pt sts recently switched to monjaro; pt sts hx of BV

## 2024-01-07 NOTE — ED Provider Notes (Signed)
 [name]    CSN: [id number] Arrival date & time: 01/07/24  1311      History   Chief Complaint Chief Complaint  Patient presents with   Dysuria   Vaginal Itching    HPI Kelly Barber is a 58 y.o. female.    Dysuria Associated symptoms: abdominal pain   Vaginal Itching Associated symptoms include abdominal pain.  Patient is here for 2 weeks of urinary frequency and urgency.  She also has recurrent bouts of BV.  She has been having vaginal itching and slight odor.  She also has had vaginal itching x 2 weeks.  She does have h/o lichen sclerosis and has a lot of itching.  No treatment for this as she is unable to see her ob/gyn at this time.  Some lower back pain.   No fevers/chills.  No n/v.  She has had bariatric surgery, with h/o constipation.  This is also contributing to her pain/issues.       Past Medical History:  Diagnosis Date   Anemia    Anxiety    takes xanax  daily   Anxiety disorder    Cataract    Mixed form OU   Complication of anesthesia    Depression, major    takes wellbutrin  and celexa  daily   Diabetes mellitus    type 2    Diabetic retinopathy (HCC)    NPDR OU   Fibromyalgia    GERD (gastroesophageal reflux disease)    doesn't take any meds   H/O hiatal hernia    Hyperlipidemia    Hypertension    takes Lisinopril and Diovan  daily   Hypertensive retinopathy    OU   Hyperthyroidism    yrs ago but doesn't require meds for this   Immune deficiency disorder    pt states she has no clue what this is   Insomnia    Joint pain    Joint swelling    PTSD (post-traumatic stress disorder)    PTSD (post-traumatic stress disorder)    PVD (peripheral vascular disease)    Sleep apnea     Patient Active Problem List   Diagnosis Date Noted   ADD (attention deficit disorder) 01/07/2023   Diabetic peripheral neuropathy associated with type 2 diabetes mellitus (HCC) 01/07/2023   Diabetic retinopathy associated with type 2 diabetes  mellitus (HCC) 01/07/2023   Fibromyalgia 01/07/2023   History of hyperthyroidism 01/07/2023   History of thyroidectomy 01/07/2023   Hyperlipidemia 01/07/2023   Microalbuminuria 01/07/2023   PTSD (post-traumatic stress disorder) 01/07/2023   Type 2 diabetes mellitus without complications (HCC) 01/07/2023   Vitamin D deficiency 01/07/2023   Anxiety 01/07/2023   Hyperthyroidism 01/07/2023   Hypothyroidism 01/07/2023   Thyromegaly 01/07/2023   Non-toxic multinodular goiter 01/07/2023   Body mass index (BMI) 31.0-31.9, adult 01/07/2023   Body mass index (BMI) 30.0-30.9, adult 01/07/2023   Obesity 01/07/2023   Peripheral vascular disease 01/07/2023   Thyroid  disorder screening 04/20/2021   Genital herpes simplex 04/14/2020   Pain in joint of right hip 08/27/2019   Pain in joint of left shoulder 08/27/2019   Pain in joint of right shoulder 08/27/2019   Pain of left hip joint 08/27/2019   Substernal thyroid  goiter 05/26/2019   Subclinical hyperthyroidism 05/26/2019   Anemia 12/09/2018   Diabetes mellitus (HCC) 12/09/2018   Disorder of thyroid  gland 12/09/2018   Benign hypertension 12/09/2018   Morbid obesity (HCC) 09/02/2017   Glaucoma 06/15/2014   S/P amputation of foot (HCC) 06/14/2014  Depression 06/14/2014   Diabetic neuropathy (HCC) 06/14/2014   Boil, axilla 08/10/2012   Goiter 05/04/2012   Aftercare following surgery of the circulatory system, NEC 12/20/2011   Atherosclerosis of native arteries of the extremities with ulceration (HCC) 12/20/2011   Preop cardiovascular exam 08/16/2011   Atherosclerosis of native artery of extremity with intermittent claudication 07/26/2011   CELLULITIS AND ABSCESS OF TRUNK 08/09/2008   LOWER LIMB AMPUTATION, OTHER TOE 08/03/2008   PERIPHERAL VASCULAR DISEASE 04/29/2008   LEUKOCYTOCLASTIC VASCULITIS 04/28/2008   Other nonthrombocytopenic purpura 04/21/2008   GERD 02/23/2008   GOITER, MULTINODULAR 12/19/2006   Diabetes (HCC) 12/19/2006    Anxiety state 12/19/2006   DEPRESSION 12/19/2006   Essential hypertension 12/19/2006   Calculus of gallbladder 12/19/2006   FIBROMYALGIA 12/19/2006   WEIGHT GAIN 12/19/2006   CHEST PAIN, NON-CARDIAC 12/19/2006    Past Surgical History:  Procedure Laterality Date   ABDOMINAL AORTAGRAM N/A 08/10/2011   Procedure: ABDOMINAL EZELLA;  Surgeon: Carlin FORBES Haddock, MD;  Location: [facility] CATH LAB;  Service: Cardiovascular;  Laterality: N/A;   AMPUTATION     lft foot toes 1.2.3   AMPUTATION  01/07/2012   Procedure: AMPUTATION RAY;  Surgeon: Carlin FORBES Haddock, MD;  Location: [facility] OR;  Service: Vascular;  Laterality: Right;  4TH & 5TH toes   aortogram     BARIATRIC SURGERY Bilateral 2019   CHOLECYSTECTOMY  03/05/1988   COLONOSCOPY     DILATION AND CURETTAGE OF UTERUS     FEMORAL BYPASS  04/29/2008   left   FEMORAL-POPLITEAL BYPASS GRAFT  08/27/2011   Procedure: BYPASS GRAFT FEMORAL-POPLITEAL ARTERY;  Surgeon: Carlin FORBES Haddock, MD;  Location: [facility] OR;  Service: Vascular;  Laterality: Right;   FEMORAL-POPLITEAL BYPASS GRAFT  08/29/2011   Procedure: BYPASS GRAFT FEMORAL-POPLITEAL ARTERY;  Surgeon: Carlin FORBES Haddock, MD;  Location: [facility] OR;  Service: Vascular;  Laterality: Right;  Vein Patch Angionplasty and Thrombectomy    GASTRIC ROUX-EN-Y N/A 09/02/2017   Procedure: LAPAROSCOPIC ROUX-EN-Y GASTRIC BYPASS WITH UPPER ENDOSCOPY AND ERAS PATHWAY;  Surgeon: Kinsinger, Herlene Righter, MD;  Location: [facility] ORS;  Service: General;  Laterality: N/A;   INTRAOPERATIVE ARTERIOGRAM  08/29/2011   Procedure: INTRA OPERATIVE ARTERIOGRAM;  Surgeon: Carlin FORBES Haddock, MD;  Location: [facility] OR;  Service: Vascular;  Laterality: Right;   LOWER EXTREMITY ANGIOGRAM N/A 08/10/2011   Procedure: LOWER EXTREMITY ANGIOGRAM;  Surgeon: Carlin FORBES Haddock, MD;  Location: [facility] CATH LAB;  Service: Cardiovascular;  Laterality: N/A;   THYROIDECTOMY N/A 05/29/2019   Procedure: TOTAL THYROIDECTOMY;  Surgeon: Eletha Boas, MD;  Location: [facility] ORS;  Service:  General;  Laterality: N/A;   TOE AMPUTATION  07/12/2008    left foot toes 1,2,3   TUBAL LIGATION     VASCULAR SURGERY      OB History   No obstetric history on file.      Home Medications    Prior to Admission medications   Medication Sig Start Date End Date Taking? Authorizing Provider  tirzepatide (MOUNJARO) 2.5 MG/0.5ML Pen Inject 2.5 mg into the skin once a week.   Yes [provider]  ACCU-CHEK GUIDE test strip 1 each by Other route as needed for other (Glucose). 07/26/22   [provider]  Accu-Chek Softclix Lancets lancets daily. 07/26/22   [provider]  acetaminophen  (TYLENOL ) 325 MG tablet Take 2 tablets (650 mg total) by mouth every 6 (six) hours as needed for mild pain (or Fever >/= 101). 05/30/19   Signe Mitzie LABOR, MD  ALPRAZolam  (XANAX )  1 MG tablet Take by mouth. 05/24/20   [provider]  ALPRAZolam  (XANAX ) 1 MG tablet Take 1 tablet by mouth 3 (three) times daily as needed.    [provider]  atorvastatin (LIPITOR) 40 MG tablet Take 40 mg by mouth. 12/10/22   [provider]  calcium -vitamin D (OSCAL WITH D) 500-200 MG-UNIT TABS tablet Oyster Shell Calcium -Vitamin D3 500 mg-5 mcg (200 unit) tablet  TAKE 2 TABLETS BY MOUTH 3 TIMES DAILY WITH MEALS    [provider]  ergocalciferol (VITAMIN D2) 1.25 MG (50000 UT) capsule Take 50,000 Units by mouth once a week.    [provider]  gabapentin  (NEURONTIN ) 100 MG capsule Take 100 mg by mouth 2 (two) times daily. 12/25/22   [provider]  gabapentin  (NEURONTIN ) 300 MG capsule Take 300 mg by mouth 2 (two) times daily.    [provider]  levothyroxine  (SYNTHROID ) 150 MCG tablet Take 1 tablet (150 mcg total) by mouth daily before breakfast. 03/26/22   Iola Lukes, FNP  nitrofurantoin , macrocrystal-monohydrate, (MACROBID ) 100 MG capsule Take 1 capsule (100 mg total) by mouth 2 (two) times daily. Patient not taking: Reported on  01/07/2024 03/25/23   Rodriguez-Southworth, Sylvia, PA-C  nystatin -triamcinolone  (MYCOLOG II) cream Apply topically 2 (two) times daily. 03/25/23   Rodriguez-Southworth, Sylvia, PA-C  OZEMPIC, 2 MG/DOSE, 8 MG/3ML SOPN Inject 2 mg into the skin once a week. 03/05/23   [provider]  Semaglutide, 1 MG/DOSE, (OZEMPIC, 1 MG/DOSE,) 4 MG/3ML SOPN Ozempic 1 mg/dose (4 mg/3 mL) subcutaneous pen injector    [provider]    Family History Family History  Problem Relation Age of Onset   Diabetes Mother    Hyperlipidemia Mother    Hypertension Mother    Diabetes Father    Heart disease Father        before age 80   Hypertension Father    Hyperlipidemia Father    Hypertension Sister    Diabetes Brother    Glaucoma Paternal Grandfather     Social History Social History   Tobacco Use   Smoking status: Former    Current packs/day: 0.00    Average packs/day: 1 pack/day for 10.0 years (10.0 ttl pk-yrs)    Types: Cigarettes    Start date: 07/26/1998    Quit date: 07/25/2008    Years since quitting: 15.4   Smokeless tobacco: Never   Tobacco comments:    quit 67yrs ago  Vaping Use   Vaping status: Never Used  Substance Use Topics   Alcohol use: No   Drug use: No     Allergies   Amoxicillin-pot clavulanate, Codeine, Darvocet [propoxyphene n-acetaminophen ], Oxycodone , Propoxyphene, and Statins   Review of Systems Review of Systems  Constitutional: Negative.   HENT: Negative.    Respiratory: Negative.    Gastrointestinal:  Positive for abdominal pain and constipation.  Genitourinary:  Positive for dysuria, urgency and vaginal pain.  Musculoskeletal: Negative.   Psychiatric/Behavioral: Negative.       Physical Exam Triage Vital Signs ED Triage Vitals [01/07/24 1333]  Encounter Vitals Group     BP 122/86     Girls Systolic BP Percentile      Girls Diastolic BP Percentile      Boys Systolic BP Percentile      Boys Diastolic BP Percentile      Pulse Rate 97      Resp 18     Temp 98.3 F (36.8 C)     Temp Source  Oral     SpO2 98 %     Weight      Height      Head Circumference      Peak Flow      Pain Score 3     Pain Loc      Pain Education      Exclude from Growth Chart    No data found.  Updated Vital Signs BP 122/86 (BP Location: Left Arm)   Pulse 97   Temp 98.3 F (36.8 C) (Oral)   Resp 18   LMP 03/05/2016 Comment: tubal ligation 2011  SpO2 98%   Visual Acuity Right Eye Distance:   Left Eye Distance:   Bilateral Distance:    Right Eye Near:   Left Eye Near:    Bilateral Near:     Physical Exam Constitutional:      General: She is not in acute distress.    Appearance: Normal appearance. She is not toxic-appearing.  Cardiovascular:     Rate and Rhythm: Normal rate and regular rhythm.  Pulmonary:     Effort: Pulmonary effort is normal.     Breath sounds: Normal breath sounds.  Abdominal:     Palpations: Abdomen is soft.     Tenderness: There is abdominal tenderness. There is no right CVA tenderness, left CVA tenderness, guarding or rebound.  Skin:    General: Skin is warm.  Neurological:     General: No focal deficit present.     Mental Status: She is alert.  Psychiatric:        Mood and Affect: Mood normal.      UC Treatments / Results  Labs (all labs ordered are listed, but only abnormal results are displayed) Labs Reviewed  POCT URINE DIPSTICK - Abnormal; Notable for the following components:      Result Value   Color, UA light yellow (*)    Clarity, UA cloudy (*)    Leukocytes, UA Small (1+) (*)    All other components within normal limits  URINE CULTURE  CERVICOVAGINAL ANCILLARY ONLY    EKG   Radiology No results found.  Procedures Procedures (including critical care time)  Medications Ordered in UC Medications - No data to display  Initial Impression / Assessment and Plan / UC Course  I have reviewed the triage vital signs and the nursing notes.  Pertinent labs & imaging  results that were available during my care of the patient were reviewed by me and considered in my medical decision making (see chart for details).   Final Clinical Impressions(s) / UC Diagnoses   Final diagnoses:  Dysuria  Vaginal discharge  Lichen sclerosus  Constipation, unspecified constipation type     Discharge Instructions      You were seen today for various issues.  I have sent out an ointment for lichen sclerosis.  Your vaginal swab and urine culture will be resulted over the next several days.  If anything is positive you will be notified for treatment otherwise.  I do recommend you increase your fiber to help with constipation.  You may trial metamucil daily.  Please follow up with your primary care provider or ob/gyn for further care of your chronic health conditions.     ED Prescriptions     Medication Sig Dispense Auth. Provider   clobetasol  ointment (TEMOVATE ) 0.05 % Apply 1 Application topically 2 (two) times daily. 30 g Darral Longs, MD      PDMP not reviewed this encounter.  Darral Longs, MD 01/07/24 1348

## 2024-01-08 LAB — CERVICOVAGINAL ANCILLARY ONLY
Bacterial Vaginitis (gardnerella): NEGATIVE
Candida Glabrata: NEGATIVE
Candida Vaginitis: NEGATIVE
Chlamydia: NEGATIVE
Comment: NEGATIVE
Comment: NEGATIVE
Comment: NEGATIVE
Comment: NEGATIVE
Comment: NEGATIVE
Comment: NORMAL
Neisseria Gonorrhea: NEGATIVE
Trichomonas: NEGATIVE

## 2024-01-10 ENCOUNTER — Ambulatory Visit (HOSPITAL_COMMUNITY): Payer: Self-pay

## 2024-01-10 LAB — URINE CULTURE: Culture: >=100000 — AB

## 2024-01-10 MED ORDER — FLUCONAZOLE 150 MG PO TABS: 150.0000 mg | ORAL_TABLET | Freq: Once | ORAL | 0 refills | Status: AC

## 2024-01-10 MED ORDER — FLUCONAZOLE 150 MG PO TABS: 150.0000 mg | ORAL_TABLET | Freq: Once | ORAL | 0 refills | Status: DC

## 2024-01-10 MED ORDER — CEFPODOXIME PROXETIL 200 MG PO TABS: 200.0000 mg | ORAL_TABLET | Freq: Two times a day (BID) | ORAL | 0 refills | Status: AC

## 2024-01-10 MED ORDER — CEFPODOXIME PROXETIL 200 MG PO TABS: 200.0000 mg | ORAL_TABLET | Freq: Two times a day (BID) | ORAL | 0 refills | Status: DC

## 2024-01-10 NOTE — Telephone Encounter (Signed)
 Drug allergies reviewed.  Patient provided with a 7-day course of Vantin 200 mg twice daily due 2 bacterial susceptibility and patient tolerating ceftriaxone  in the past.

## 2024-01-10 NOTE — Addendum Note (Signed)
 Addended by: ARNALDO ALFONSO RAMAN on: 01/10/2024 01:02 PM   Modules accepted: Orders

## 2024-04-16 ENCOUNTER — Ambulatory Visit: Admitting: Endocrinology
